# Patient Record
Sex: Female | Born: 1945 | Race: White | Hispanic: No | Marital: Married | Smoking: Never smoker
Health system: Southern US, Community
[De-identification: ages and names within clinical notes are randomized; demographics above are authoritative.]

## PROBLEM LIST (undated history)

## (undated) DIAGNOSIS — M1990 Unspecified osteoarthritis, unspecified site: Secondary | ICD-10-CM

## (undated) DIAGNOSIS — R059 Cough, unspecified: Secondary | ICD-10-CM

## (undated) DIAGNOSIS — R238 Other skin changes: Secondary | ICD-10-CM

## (undated) DIAGNOSIS — E119 Type 2 diabetes mellitus without complications: Secondary | ICD-10-CM

## (undated) DIAGNOSIS — M25569 Pain in unspecified knee: Secondary | ICD-10-CM

## (undated) DIAGNOSIS — I517 Cardiomegaly: Secondary | ICD-10-CM

## (undated) DIAGNOSIS — J309 Allergic rhinitis, unspecified: Secondary | ICD-10-CM

## (undated) DIAGNOSIS — J3489 Other specified disorders of nose and nasal sinuses: Secondary | ICD-10-CM

## (undated) DIAGNOSIS — N3281 Overactive bladder: Secondary | ICD-10-CM

## (undated) DIAGNOSIS — R233 Spontaneous ecchymoses: Secondary | ICD-10-CM

## (undated) DIAGNOSIS — M810 Age-related osteoporosis without current pathological fracture: Secondary | ICD-10-CM

## (undated) DIAGNOSIS — H5789 Other specified disorders of eye and adnexa: Secondary | ICD-10-CM

## (undated) DIAGNOSIS — R0602 Shortness of breath: Secondary | ICD-10-CM

## (undated) DIAGNOSIS — M2550 Pain in unspecified joint: Secondary | ICD-10-CM

## (undated) DIAGNOSIS — E559 Vitamin D deficiency, unspecified: Secondary | ICD-10-CM

## (undated) DIAGNOSIS — I10 Essential (primary) hypertension: Secondary | ICD-10-CM

## (undated) DIAGNOSIS — G4733 Obstructive sleep apnea (adult) (pediatric): Secondary | ICD-10-CM

## (undated) DIAGNOSIS — N8110 Cystocele, unspecified: Secondary | ICD-10-CM

## (undated) DIAGNOSIS — Z96653 Presence of artificial knee joint, bilateral: Secondary | ICD-10-CM

## (undated) DIAGNOSIS — G4730 Sleep apnea, unspecified: Secondary | ICD-10-CM

## (undated) DIAGNOSIS — K589 Irritable bowel syndrome without diarrhea: Secondary | ICD-10-CM

## (undated) DIAGNOSIS — I350 Nonrheumatic aortic (valve) stenosis: Secondary | ICD-10-CM

## (undated) DIAGNOSIS — M549 Dorsalgia, unspecified: Secondary | ICD-10-CM

## (undated) DIAGNOSIS — R05 Cough: Secondary | ICD-10-CM

## (undated) DIAGNOSIS — I272 Pulmonary hypertension, unspecified: Secondary | ICD-10-CM

## (undated) DIAGNOSIS — N200 Calculus of kidney: Secondary | ICD-10-CM

## (undated) DIAGNOSIS — H2512 Age-related nuclear cataract, left eye: Secondary | ICD-10-CM

## (undated) DIAGNOSIS — M1712 Unilateral primary osteoarthritis, left knee: Secondary | ICD-10-CM

## (undated) DIAGNOSIS — N318 Other neuromuscular dysfunction of bladder: Secondary | ICD-10-CM

## (undated) DIAGNOSIS — N390 Urinary tract infection, site not specified: Secondary | ICD-10-CM

## (undated) DIAGNOSIS — K219 Gastro-esophageal reflux disease without esophagitis: Secondary | ICD-10-CM

## (undated) DIAGNOSIS — I639 Cerebral infarction, unspecified: Secondary | ICD-10-CM

## (undated) DIAGNOSIS — H9190 Unspecified hearing loss, unspecified ear: Secondary | ICD-10-CM

## (undated) DIAGNOSIS — R682 Dry mouth, unspecified: Secondary | ICD-10-CM

## (undated) DIAGNOSIS — N816 Rectocele: Secondary | ICD-10-CM

## (undated) DIAGNOSIS — R011 Cardiac murmur, unspecified: Secondary | ICD-10-CM

## (undated) DIAGNOSIS — E785 Hyperlipidemia, unspecified: Secondary | ICD-10-CM

## (undated) DIAGNOSIS — E782 Mixed hyperlipidemia: Secondary | ICD-10-CM

## (undated) DIAGNOSIS — G4700 Insomnia, unspecified: Secondary | ICD-10-CM

## (undated) DIAGNOSIS — Z8673 Personal history of transient ischemic attack (TIA), and cerebral infarction without residual deficits: Secondary | ICD-10-CM

## (undated) DIAGNOSIS — R252 Cramp and spasm: Secondary | ICD-10-CM

## (undated) DIAGNOSIS — M7989 Other specified soft tissue disorders: Secondary | ICD-10-CM

## (undated) HISTORY — PX: OTHER SURGICAL HISTORY: SHX169

## (undated) HISTORY — DX: Irritable bowel syndrome without diarrhea: K58.9

## (undated) HISTORY — DX: Pain in unspecified knee: M25.569

## (undated) HISTORY — PX: KIDNEY STONE SURGERY: SHX686

## (undated) HISTORY — DX: Personal history of transient ischemic attack (TIA), and cerebral infarction without residual deficits: Z86.73

## (undated) HISTORY — PX: RECTOCELE REPAIR: SHX761

## (undated) HISTORY — DX: Essential (primary) hypertension: I10

## (undated) HISTORY — DX: Cardiac murmur, unspecified: R01.1

## (undated) HISTORY — DX: Morbid (severe) obesity due to excess calories: E66.01

## (undated) HISTORY — DX: Dorsalgia, unspecified: M54.9

## (undated) HISTORY — DX: Spontaneous ecchymoses: R23.3

## (undated) HISTORY — DX: Mixed hyperlipidemia: E78.2

## (undated) HISTORY — DX: Cramp and spasm: R25.2

## (undated) HISTORY — DX: Presence of artificial knee joint, bilateral: Z96.653

## (undated) HISTORY — PX: CYSTOCELE REPAIR: SHX163

## (undated) HISTORY — DX: Calculus of kidney: N20.0

## (undated) HISTORY — DX: Other specified disorders of nose and nasal sinuses: J34.89

## (undated) HISTORY — DX: Other specified disorders of eye and adnexa: H57.89

## (undated) HISTORY — DX: Age-related nuclear cataract, left eye: H25.12

## (undated) HISTORY — DX: Overactive bladder: N32.81

## (undated) HISTORY — DX: Vitamin D deficiency, unspecified: E55.9

## (undated) HISTORY — PX: URETEROSCOPY: SHX842

## (undated) HISTORY — DX: Unilateral primary osteoarthritis, left knee: M17.12

## (undated) HISTORY — DX: Type 2 diabetes mellitus without complications: E11.9

## (undated) HISTORY — DX: Cardiomegaly: I51.7

## (undated) HISTORY — DX: Pain in unspecified joint: M25.50

## (undated) HISTORY — DX: Insomnia, unspecified: G47.00

## (undated) HISTORY — DX: Sleep apnea, unspecified: G47.30

## (undated) HISTORY — PX: URETERAL STENT PLACEMENT: SHX822

## (undated) HISTORY — DX: Shortness of breath: R06.02

## (undated) HISTORY — DX: Unspecified osteoarthritis, unspecified site: M19.90

## (undated) HISTORY — DX: Other neuromuscular dysfunction of bladder: N31.8

## (undated) HISTORY — DX: Rectocele: N81.6

## (undated) HISTORY — DX: Age-related osteoporosis without current pathological fracture: M81.0

## (undated) HISTORY — DX: Cough, unspecified: R05.9

## (undated) HISTORY — DX: Gastro-esophageal reflux disease without esophagitis: K21.9

## (undated) HISTORY — DX: Hyperlipidemia, unspecified: E78.5

## (undated) HISTORY — DX: Obstructive sleep apnea (adult) (pediatric): G47.33

## (undated) HISTORY — DX: Cough: R05

## (undated) HISTORY — DX: Unspecified hearing loss, unspecified ear: H91.90

## (undated) HISTORY — DX: Dry mouth, unspecified: R68.2

## (undated) HISTORY — DX: Cerebral infarction, unspecified: I63.9

## (undated) HISTORY — DX: Pulmonary hypertension, unspecified: I27.20

## (undated) HISTORY — DX: Other skin changes: R23.8

## (undated) HISTORY — DX: Allergic rhinitis, unspecified: J30.9

## (undated) HISTORY — DX: Nonrheumatic aortic (valve) stenosis: I35.0

## (undated) HISTORY — DX: Urinary tract infection, site not specified: N39.0

## (undated) HISTORY — DX: Cystocele, unspecified: N81.10

## (undated) HISTORY — PX: CATARACT EXTRACTION: SUR2

## (undated) HISTORY — DX: Other specified soft tissue disorders: M79.89

---
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
## 1983-02-22 HISTORY — PX: FRACTURE SURGERY: SHX138

## 2002-02-21 HISTORY — PX: HERNIA REPAIR: SHX51

## 2010-02-21 HISTORY — PX: JOINT REPLACEMENT: SHX530

## 2012-04-05 ENCOUNTER — Ambulatory Visit: Payer: Medicare Other | Admitting: Physical Therapy

## 2012-04-12 ENCOUNTER — Ambulatory Visit: Payer: Medicare Other | Attending: Orthopedic Surgery | Admitting: Rehabilitation

## 2012-04-12 DIAGNOSIS — IMO0001 Reserved for inherently not codable concepts without codable children: Secondary | ICD-10-CM | POA: Insufficient documentation

## 2012-04-12 DIAGNOSIS — M25579 Pain in unspecified ankle and joints of unspecified foot: Secondary | ICD-10-CM | POA: Insufficient documentation

## 2012-04-12 DIAGNOSIS — R262 Difficulty in walking, not elsewhere classified: Secondary | ICD-10-CM | POA: Insufficient documentation

## 2012-04-12 DIAGNOSIS — M25673 Stiffness of unspecified ankle, not elsewhere classified: Secondary | ICD-10-CM | POA: Insufficient documentation

## 2012-04-12 DIAGNOSIS — M25676 Stiffness of unspecified foot, not elsewhere classified: Secondary | ICD-10-CM | POA: Insufficient documentation

## 2012-04-16 ENCOUNTER — Ambulatory Visit: Payer: Medicare Other | Admitting: Rehabilitation

## 2012-04-19 ENCOUNTER — Ambulatory Visit: Payer: Medicare Other | Admitting: Rehabilitation

## 2012-04-23 ENCOUNTER — Ambulatory Visit: Payer: Medicare Other | Attending: Orthopedic Surgery | Admitting: Rehabilitation

## 2012-04-23 DIAGNOSIS — M25673 Stiffness of unspecified ankle, not elsewhere classified: Secondary | ICD-10-CM | POA: Insufficient documentation

## 2012-04-23 DIAGNOSIS — IMO0001 Reserved for inherently not codable concepts without codable children: Secondary | ICD-10-CM | POA: Insufficient documentation

## 2012-04-23 DIAGNOSIS — M25676 Stiffness of unspecified foot, not elsewhere classified: Secondary | ICD-10-CM | POA: Insufficient documentation

## 2012-04-23 DIAGNOSIS — M25579 Pain in unspecified ankle and joints of unspecified foot: Secondary | ICD-10-CM | POA: Insufficient documentation

## 2012-04-23 DIAGNOSIS — R262 Difficulty in walking, not elsewhere classified: Secondary | ICD-10-CM | POA: Insufficient documentation

## 2012-04-26 ENCOUNTER — Ambulatory Visit: Payer: Medicare Other | Admitting: Rehabilitation

## 2012-04-30 ENCOUNTER — Ambulatory Visit: Payer: Medicare Other | Admitting: Rehabilitation

## 2012-05-03 ENCOUNTER — Ambulatory Visit: Payer: Medicare Other | Admitting: Rehabilitation

## 2012-05-07 ENCOUNTER — Ambulatory Visit: Payer: Medicare Other | Admitting: Rehabilitation

## 2012-05-10 ENCOUNTER — Ambulatory Visit: Payer: Medicare Other | Admitting: Rehabilitation

## 2012-05-15 ENCOUNTER — Ambulatory Visit: Payer: Medicare Other | Admitting: Rehabilitation

## 2012-05-18 ENCOUNTER — Ambulatory Visit: Payer: Medicare Other | Admitting: Rehabilitation

## 2012-05-21 ENCOUNTER — Ambulatory Visit: Payer: Medicare Other | Admitting: Rehabilitation

## 2012-05-24 ENCOUNTER — Ambulatory Visit: Payer: Medicare Other | Attending: Orthopedic Surgery | Admitting: Rehabilitation

## 2012-05-24 DIAGNOSIS — M25673 Stiffness of unspecified ankle, not elsewhere classified: Secondary | ICD-10-CM | POA: Insufficient documentation

## 2012-05-24 DIAGNOSIS — IMO0001 Reserved for inherently not codable concepts without codable children: Secondary | ICD-10-CM | POA: Insufficient documentation

## 2012-05-24 DIAGNOSIS — M25579 Pain in unspecified ankle and joints of unspecified foot: Secondary | ICD-10-CM | POA: Insufficient documentation

## 2012-05-24 DIAGNOSIS — M25676 Stiffness of unspecified foot, not elsewhere classified: Secondary | ICD-10-CM | POA: Insufficient documentation

## 2012-05-24 DIAGNOSIS — R262 Difficulty in walking, not elsewhere classified: Secondary | ICD-10-CM | POA: Insufficient documentation

## 2012-05-28 ENCOUNTER — Ambulatory Visit: Payer: Medicare Other | Admitting: Rehabilitation

## 2012-05-31 ENCOUNTER — Encounter: Payer: Medicare Other | Admitting: Rehabilitation

## 2012-06-04 ENCOUNTER — Ambulatory Visit: Payer: Medicare Other | Admitting: Rehabilitation

## 2012-06-07 ENCOUNTER — Encounter: Payer: Medicare Other | Admitting: Rehabilitation

## 2012-06-11 ENCOUNTER — Ambulatory Visit: Payer: Medicare Other | Admitting: Rehabilitation

## 2012-06-14 ENCOUNTER — Encounter: Payer: Medicare Other | Admitting: Rehabilitation

## 2012-06-18 ENCOUNTER — Ambulatory Visit: Payer: Medicare Other | Admitting: Rehabilitation

## 2012-06-18 ENCOUNTER — Encounter: Payer: Medicare Other | Admitting: Rehabilitation

## 2012-06-21 ENCOUNTER — Encounter: Payer: Medicare Other | Admitting: Rehabilitation

## 2012-08-08 ENCOUNTER — Ambulatory Visit: Payer: Medicare Other | Admitting: Rehabilitation

## 2012-08-10 ENCOUNTER — Ambulatory Visit: Payer: Medicare Other | Attending: Orthopedic Surgery | Admitting: Rehabilitation

## 2012-08-10 DIAGNOSIS — M25673 Stiffness of unspecified ankle, not elsewhere classified: Secondary | ICD-10-CM | POA: Insufficient documentation

## 2012-08-10 DIAGNOSIS — IMO0001 Reserved for inherently not codable concepts without codable children: Secondary | ICD-10-CM | POA: Insufficient documentation

## 2012-08-10 DIAGNOSIS — M25579 Pain in unspecified ankle and joints of unspecified foot: Secondary | ICD-10-CM | POA: Insufficient documentation

## 2012-08-10 DIAGNOSIS — R262 Difficulty in walking, not elsewhere classified: Secondary | ICD-10-CM | POA: Insufficient documentation

## 2012-08-10 DIAGNOSIS — M25676 Stiffness of unspecified foot, not elsewhere classified: Secondary | ICD-10-CM | POA: Insufficient documentation

## 2012-08-13 ENCOUNTER — Ambulatory Visit: Payer: Medicare Other | Admitting: Rehabilitation

## 2012-08-15 ENCOUNTER — Ambulatory Visit: Payer: Medicare Other | Admitting: Rehabilitation

## 2012-08-20 ENCOUNTER — Ambulatory Visit: Payer: Medicare Other | Admitting: Rehabilitation

## 2012-08-22 ENCOUNTER — Ambulatory Visit: Payer: Medicare Other | Attending: Orthopedic Surgery | Admitting: Rehabilitation

## 2012-08-22 DIAGNOSIS — M25673 Stiffness of unspecified ankle, not elsewhere classified: Secondary | ICD-10-CM | POA: Insufficient documentation

## 2012-08-22 DIAGNOSIS — M25676 Stiffness of unspecified foot, not elsewhere classified: Secondary | ICD-10-CM | POA: Insufficient documentation

## 2012-08-22 DIAGNOSIS — R262 Difficulty in walking, not elsewhere classified: Secondary | ICD-10-CM | POA: Insufficient documentation

## 2012-08-22 DIAGNOSIS — M25579 Pain in unspecified ankle and joints of unspecified foot: Secondary | ICD-10-CM | POA: Insufficient documentation

## 2012-08-22 DIAGNOSIS — IMO0001 Reserved for inherently not codable concepts without codable children: Secondary | ICD-10-CM | POA: Insufficient documentation

## 2012-08-27 ENCOUNTER — Ambulatory Visit: Payer: Medicare Other | Admitting: Rehabilitation

## 2012-08-29 ENCOUNTER — Ambulatory Visit: Payer: Medicare Other | Admitting: Rehabilitation

## 2012-09-03 ENCOUNTER — Ambulatory Visit: Payer: Medicare Other | Admitting: Rehabilitation

## 2012-09-05 ENCOUNTER — Ambulatory Visit: Payer: Medicare Other | Admitting: Rehabilitation

## 2012-09-12 ENCOUNTER — Ambulatory Visit: Payer: Medicare Other | Admitting: Rehabilitation

## 2012-09-19 ENCOUNTER — Ambulatory Visit: Payer: Medicare Other | Admitting: Rehabilitation

## 2013-08-13 DIAGNOSIS — T148XXA Other injury of unspecified body region, initial encounter: Secondary | ICD-10-CM | POA: Insufficient documentation

## 2013-08-16 DIAGNOSIS — E663 Overweight: Secondary | ICD-10-CM | POA: Insufficient documentation

## 2013-11-15 DIAGNOSIS — S93402A Sprain of unspecified ligament of left ankle, initial encounter: Secondary | ICD-10-CM | POA: Insufficient documentation

## 2013-12-04 DIAGNOSIS — Z471 Aftercare following joint replacement surgery: Secondary | ICD-10-CM | POA: Insufficient documentation

## 2013-12-04 DIAGNOSIS — Z96651 Presence of right artificial knee joint: Secondary | ICD-10-CM | POA: Insufficient documentation

## 2013-12-10 ENCOUNTER — Ambulatory Visit: Payer: Medicare Other | Attending: Orthopedic Surgery | Admitting: Physical Therapy

## 2013-12-10 DIAGNOSIS — M25672 Stiffness of left ankle, not elsewhere classified: Secondary | ICD-10-CM | POA: Diagnosis not present

## 2013-12-10 DIAGNOSIS — S93402A Sprain of unspecified ligament of left ankle, initial encounter: Secondary | ICD-10-CM | POA: Diagnosis not present

## 2013-12-10 DIAGNOSIS — M25572 Pain in left ankle and joints of left foot: Secondary | ICD-10-CM | POA: Diagnosis not present

## 2013-12-10 DIAGNOSIS — R011 Cardiac murmur, unspecified: Secondary | ICD-10-CM | POA: Diagnosis not present

## 2013-12-10 DIAGNOSIS — E119 Type 2 diabetes mellitus without complications: Secondary | ICD-10-CM | POA: Diagnosis not present

## 2013-12-10 DIAGNOSIS — X58XXXA Exposure to other specified factors, initial encounter: Secondary | ICD-10-CM | POA: Diagnosis not present

## 2013-12-10 DIAGNOSIS — Z96651 Presence of right artificial knee joint: Secondary | ICD-10-CM | POA: Insufficient documentation

## 2013-12-10 DIAGNOSIS — Z5189 Encounter for other specified aftercare: Secondary | ICD-10-CM | POA: Insufficient documentation

## 2013-12-10 DIAGNOSIS — I10 Essential (primary) hypertension: Secondary | ICD-10-CM | POA: Diagnosis not present

## 2013-12-11 ENCOUNTER — Ambulatory Visit: Payer: Medicare Other | Admitting: Physical Therapy

## 2013-12-11 DIAGNOSIS — Z5189 Encounter for other specified aftercare: Secondary | ICD-10-CM | POA: Diagnosis not present

## 2013-12-13 DIAGNOSIS — M76822 Posterior tibial tendinitis, left leg: Secondary | ICD-10-CM | POA: Insufficient documentation

## 2013-12-18 ENCOUNTER — Ambulatory Visit: Payer: Medicare Other | Admitting: Physical Therapy

## 2013-12-18 DIAGNOSIS — Z5189 Encounter for other specified aftercare: Secondary | ICD-10-CM | POA: Diagnosis not present

## 2013-12-20 ENCOUNTER — Ambulatory Visit: Payer: Medicare Other | Admitting: Physical Therapy

## 2013-12-20 DIAGNOSIS — Z5189 Encounter for other specified aftercare: Secondary | ICD-10-CM | POA: Diagnosis not present

## 2013-12-23 ENCOUNTER — Encounter: Payer: Medicare Other | Admitting: Physical Therapy

## 2013-12-24 ENCOUNTER — Ambulatory Visit: Payer: Medicare Other | Attending: Orthopedic Surgery | Admitting: Physical Therapy

## 2013-12-24 ENCOUNTER — Encounter (INDEPENDENT_AMBULATORY_CARE_PROVIDER_SITE_OTHER): Payer: Self-pay

## 2013-12-24 DIAGNOSIS — M25672 Stiffness of left ankle, not elsewhere classified: Secondary | ICD-10-CM | POA: Insufficient documentation

## 2013-12-24 DIAGNOSIS — M25572 Pain in left ankle and joints of left foot: Secondary | ICD-10-CM | POA: Diagnosis present

## 2013-12-26 ENCOUNTER — Encounter: Payer: Medicare Other | Admitting: Rehabilitation

## 2013-12-27 ENCOUNTER — Ambulatory Visit: Payer: Medicare Other | Admitting: Rehabilitation

## 2013-12-27 DIAGNOSIS — M25572 Pain in left ankle and joints of left foot: Secondary | ICD-10-CM | POA: Diagnosis not present

## 2013-12-30 ENCOUNTER — Ambulatory Visit: Payer: Medicare Other | Admitting: Physical Therapy

## 2013-12-30 DIAGNOSIS — M25572 Pain in left ankle and joints of left foot: Secondary | ICD-10-CM | POA: Diagnosis not present

## 2014-01-02 ENCOUNTER — Ambulatory Visit: Payer: Medicare Other | Admitting: Rehabilitation

## 2014-01-02 DIAGNOSIS — M25572 Pain in left ankle and joints of left foot: Secondary | ICD-10-CM | POA: Diagnosis not present

## 2014-01-06 ENCOUNTER — Ambulatory Visit: Payer: Medicare Other | Admitting: Physical Therapy

## 2014-01-06 DIAGNOSIS — M25572 Pain in left ankle and joints of left foot: Secondary | ICD-10-CM | POA: Diagnosis not present

## 2014-01-09 ENCOUNTER — Ambulatory Visit: Payer: Medicare Other | Admitting: Rehabilitation

## 2014-01-09 DIAGNOSIS — M25572 Pain in left ankle and joints of left foot: Secondary | ICD-10-CM | POA: Diagnosis not present

## 2014-01-13 ENCOUNTER — Ambulatory Visit: Payer: Medicare Other | Admitting: Physical Therapy

## 2014-01-13 DIAGNOSIS — M25572 Pain in left ankle and joints of left foot: Secondary | ICD-10-CM | POA: Diagnosis not present

## 2014-01-15 ENCOUNTER — Ambulatory Visit: Payer: Medicare Other | Admitting: Rehabilitation

## 2014-01-15 DIAGNOSIS — M25572 Pain in left ankle and joints of left foot: Secondary | ICD-10-CM | POA: Diagnosis not present

## 2014-01-20 ENCOUNTER — Ambulatory Visit: Payer: Medicare Other | Admitting: Physical Therapy

## 2014-01-20 DIAGNOSIS — M25572 Pain in left ankle and joints of left foot: Secondary | ICD-10-CM | POA: Diagnosis not present

## 2014-06-03 ENCOUNTER — Ambulatory Visit: Payer: Medicare Other | Attending: Orthopedic Surgery | Admitting: Physical Therapy

## 2014-06-03 ENCOUNTER — Encounter: Payer: Self-pay | Admitting: Physical Therapy

## 2014-06-03 DIAGNOSIS — R262 Difficulty in walking, not elsewhere classified: Secondary | ICD-10-CM | POA: Diagnosis not present

## 2014-06-03 DIAGNOSIS — M25572 Pain in left ankle and joints of left foot: Secondary | ICD-10-CM | POA: Diagnosis present

## 2014-06-03 DIAGNOSIS — M259 Joint disorder, unspecified: Secondary | ICD-10-CM | POA: Insufficient documentation

## 2014-06-03 DIAGNOSIS — R29898 Other symptoms and signs involving the musculoskeletal system: Secondary | ICD-10-CM

## 2014-06-03 NOTE — Therapy (Signed)
[facility] [address]  [place], [place], [address] Phone: [phone]   Fax:  [phone]  Physical Therapy Evaluation  Patient Details  Name: Jasmin [name] MRN: [id number] Date of Birth: 06-22-1945 Referring Provider:  Donette Larry, MD  Encounter Date: 06/03/2014      PT End of Session - 06/03/14 0954    Visit Number 1   Number of Visits 10   Date for PT Re-Evaluation 07/08/14   PT Start Time 0920   PT Stop Time 1025   PT Time Calculation (min) 65 min      Past Medical History  Diagnosis Date  . Diabetes mellitus without complication   . Sleep apnea   . Hypertension   . Arthritis     Past Surgical History  Procedure Laterality Date  . Joint replacement Right 2012    R TKA  . Fracture surgery Left 1953    L arm  . Fracture surgery Left 1985    L ankle  . R foot/ankle Right 2013 and 2014    plate and screws lateral foot and tendon removal medial foot in 2013, revision in 2014    There were no vitals filed for this visit.  Visit Diagnosis:  Left ankle pain - Plan: PT plan of care cert/re-cert  Ankle weakness - Plan: PT plan of care cert/re-cert  Difficulty walking - Plan: PT plan of care cert/re-cert      Subjective Assessment - 06/03/14 0943    Subjective pt here due to L foot/ankle pain.  She has history of fx to this ankle 30 years ago with fixation.  She fell on 9Aug2015 and noted pain to L foot/ankle.  X-rays indicated high ankle sprain and she has been in/out boot and brace since that time.  She participated in PT at this location under the care of a different Physical Therapy provider in Nov and Dec 2015 with progression to ambulation without need for boot.  She returns today due to continued pain.  She states she doesn't feel any worse than the last time she was here in Dec 2015 but states she does not feel any better either and MD wants her to return to PT and to include  iontorphoresis and [name] into POC at this time.   How long can you stand comfortably? maybe 20 minutes   How long can you walk comfortably? 10-15 minutes   Patient Stated Goals be able to get back to walking/hiking without pain   Currently in Pain? Yes   Pain Score --  0/10 at rest, 4-5/10 with walking, 5-6/10 at worst   Pain Location Ankle   Pain Orientation Left;Medial   Aggravating Factors  standing, walking, and ankle IV AROM   Pain Relieving Factors meds, rest, ice   Multiple Pain Sites No            [facility] PT Assessment - 06/03/14 0001    Assessment   Medical Diagnosis L poterior tib dysfunction   Onset Date 09/29/13   Balance Screen   Has the patient fallen in the past 6 months No   Has the patient had a decrease in activity level because of a fear of falling?  No   Is the patient reluctant to leave their home because of a fear of falling?  No   Prior Function   Vocation Retired   Leisure stays busy with volunteer work, likes to walk 2-3x/wk on track at sports center but  has not been participating lately, uses stationary bike 5 min/day at home   Observation/Other Assessments   Focus on Therapeutic Outcomes (FOTO)  52% limitation   Functional Tests   Functional tests Squat;Single leg stance   Squat   Comments L navicular drop, L tibia E and medial collapse L knee   Single Leg Stance   Comments unsteady B limited to 3-5" on R and unable on L   Posture/Postural Control   Posture Comments overweight, L pes planus greater than R, L foot ER vs L in standing and walking   ROM / Strength   AROM / PROM / Strength PROM;Strength   PROM   Overall PROM Comments restricted L ankle DF   Strength   Overall Strength Comments L Ankle IV 4/5 (pain), L Hip ER 4/5 (no pain).  Hip Ext, ABD, and ADD not assessed today.   Flexibility   Soft Tissue Assessment /Muscle Length --  Tight L Achilles   Palpation   Palpation TTP medial L ankle and lower leg in area of posterior tib tendon         TODAY'S TREATMENT: TherEx - Ankle DF stretch standing, Hip ER Black TB 15x                   PT Education - 06/03/14 1302    Education provided Yes   Education Details Ankle DF stretch, side-lying clams with black TB   Person(s) Educated Patient   Methods Explanation;Demonstration   Comprehension Returned demonstration;Verbalized understanding          PT Short Term Goals - 06/03/14 1312    PT SHORT TERM GOAL #1   Title pt independent with initial HEP by 06/10/14   Status New           PT Long Term Goals - 06/03/14 1312    PT LONG TERM GOAL #1   Title pt independent with advanced HEP as necessary by 06/1714   Status New   PT LONG TERM GOAL #2   Title pt able to stand/walk up to 1 hour without limit by pain by 07/08/14   Status New   PT LONG TERM GOAL #3   Title Squat mechanics improved to include knees tracking over toes with minimal to no medial collapse and less navicular drop on L by 07/08/14   Status New               Plan - 06/03/14 1303    Clinical Impression Statement pt with c/o L medial ankle pain which seems posterior tib tendon in nature.  She displays multiple factors which contribute to this pain to include: poor shoe choice, excessive L navicular drop, weak hip ER, tight  L Achilles tendon, and obesity.  We will address all limitations that we can effect with hopes of decreased pain with increased function.   Pt will benefit from skilled therapeutic intervention in order to improve on the following deficits Pain;Abnormal gait;Impaired flexibility;Difficulty walking;Decreased range of motion;Improper body mechanics;Decreased activity tolerance;Obesity;Decreased balance;Decreased strength   Rehab Potential Good   PT Frequency 2x / week   PT Duration --  5 weeks   PT Treatment/Interventions Therapeutic exercise;Manual techniques;Ultrasound;Cryotherapy;Electrical Stimulation;Therapeutic activities;Dry needling;Gait training;Balance  training;Other (comment)  iontophoresis with 43mA.min dex   PT Next Visit Plan limit standing exercises for now - perform mat exercises for hip and ankle strengthening; ankle DF stretching, begin ionto.   Consulted and Agree with Plan of Care Patient  G-Codes - 06/03/14 1320    Functional Limitation Mobility: Walking and moving around   Mobility: Walking and Moving Around Current Status [phone]) At least 40 percent but less than 60 percent impaired, limited or restricted   Mobility: Walking and Moving Around Goal Status [phone]) At least 20 percent but less than 40 percent impaired, limited or restricted       Problem List There are no active problems to display for this patient.   Cornesha Radziewicz PT, OCS 06/03/2014, 1:22 PM  [facility] [address]  [place], [place], [address] Phone: [phone]   Fax:  [phone]

## 2014-06-05 ENCOUNTER — Ambulatory Visit: Payer: Medicare Other | Admitting: Physical Therapy

## 2014-06-05 DIAGNOSIS — M25572 Pain in left ankle and joints of left foot: Secondary | ICD-10-CM

## 2014-06-05 DIAGNOSIS — R29898 Other symptoms and signs involving the musculoskeletal system: Secondary | ICD-10-CM

## 2014-06-05 DIAGNOSIS — R262 Difficulty in walking, not elsewhere classified: Secondary | ICD-10-CM

## 2014-06-05 NOTE — Therapy (Signed)
[facility] [address]  [place], [place], [address] Phone: [phone]   Fax:  [phone]  Physical Therapy Treatment  Patient Details  Name: Jasmin [name] MRN: [id number] Date of Birth: 1945/08/12 Referring Provider:  Donette Larry, MD  Encounter Date: 06/05/2014      PT End of Session - 06/05/14 0807    Visit Number 2   Number of Visits 10   Date for PT Re-Evaluation 07/08/14   PT Start Time 0803   PT Stop Time 0846   PT Time Calculation (min) 43 min      Past Medical History  Diagnosis Date  . Diabetes mellitus without complication   . Sleep apnea   . Hypertension   . Arthritis     Past Surgical History  Procedure Laterality Date  . Joint replacement Right 2012    R TKA  . Fracture surgery Left 1953    L arm  . Fracture surgery Left 1985    L ankle  . R foot/ankle Right 2013 and 2014    plate and screws lateral foot and tendon removal medial foot in 2013, revision in 2014    There were no vitals filed for this visit.  Visit Diagnosis:  Ankle weakness  Left ankle pain  Difficulty walking      Subjective Assessment - 06/05/14 0805    Subjective states feels fine this AM but is still early and hasn't walked much yet.   Currently in Pain? Yes   Pain Score 2    Pain Location Ankle   Pain Orientation Left;Medial   Multiple Pain Sites No       TODAY'S TREATMENT: TherEx - NuStep lvl 3, 4' DF Rocker Stretch 3x20" Bridge 10x Bridge with Alt Knee Ext 10x Side-Lying Clam Black TB 10x Bridge with Black TB at knees 10x TRX DL Squat   [name] - 20%, 3.3 MHz, 0.5W/cm2, 8', 2cm, L post tib tendon  Ionto patch (6 hour STAT patch with 11mA.min dex) to medial L ankle over post tib tendon.  Pt advised to check blood sugar and remove patch early if any adverse reaction noted.  Otherwise she is to remove between 1245 and 1445.                        PT Short Term Goals  - 06/05/14 0818    PT SHORT TERM GOAL #1   Title pt independent with initial HEP by 06/10/14   Status On-going           PT Long Term Goals - 06/05/14 0818    PT LONG TERM GOAL #1   Title pt independent with advanced HEP as necessary by 06/1714   Status On-going   PT LONG TERM GOAL #2   Title pt able to stand/walk up to 1 hour without limit by pain by 07/08/14   Status On-going   PT LONG TERM GOAL #3   Title Squat mechanics improved to include knees tracking over toes with minimal to no medial collapse and less navicular drop on L by 07/08/14   Status On-going               Plan - 06/05/14 0929    Clinical Impression Statement good tolerance to initial treatment focus on hip stability, is to perform ankle tband exercises at home along with HEP from initial eval.   PT Next Visit Plan limit standing exercises for now -  perform mat exercises for hip and ankle strengthening; ankle DF stretching, continue ionto if tolerated well.   Consulted and Agree with Plan of Care Patient        Problem List There are no active problems to display for this patient.   Audrie Kuri PT, OCS 06/05/2014, 9:31 AM  [facility] [address]  [place], [place], [address] Phone: [phone]   Fax:  [phone]

## 2014-06-09 ENCOUNTER — Ambulatory Visit: Payer: Medicare Other | Admitting: Rehabilitation

## 2014-06-09 DIAGNOSIS — M25572 Pain in left ankle and joints of left foot: Secondary | ICD-10-CM

## 2014-06-09 DIAGNOSIS — R262 Difficulty in walking, not elsewhere classified: Secondary | ICD-10-CM

## 2014-06-09 DIAGNOSIS — R29898 Other symptoms and signs involving the musculoskeletal system: Secondary | ICD-10-CM

## 2014-06-09 NOTE — Therapy (Signed)
[facility] [address]  [place], [place], [address] Phone: [phone]   Fax:  [phone]  Physical Therapy Treatment  Patient Details  Name: Jasmin [name] MRN: [id number] Date of Birth: 05/16/1945 Referring Provider:  Donette Larry, MD  Encounter Date: 06/09/2014      PT End of Session - 06/09/14 1054    Visit Number 3   Number of Visits 10   Date for PT Re-Evaluation 07/08/14   PT Start Time 1055   PT Stop Time 1145   PT Time Calculation (min) 50 min      Past Medical History  Diagnosis Date  . Diabetes mellitus without complication   . Sleep apnea   . Hypertension   . Arthritis     Past Surgical History  Procedure Laterality Date  . Joint replacement Right 2012    R TKA  . Fracture surgery Left 1953    L arm  . Fracture surgery Left 1985    L ankle  . R foot/ankle Right 2013 and 2014    plate and screws lateral foot and tendon removal medial foot in 2013, revision in 2014    There were no vitals filed for this visit.  Visit Diagnosis:  Ankle weakness  Left ankle pain  Difficulty walking      Subjective Assessment - 06/09/14 1059    Subjective Reports patch did wonderful with no problems. Didn't have the soreness in the Lt foot until last night. Also feels like she can go down stairs better (like her foot is  bending more).   Currently in Pain? No/denies      TODAY'S TREATMENT: TherEx - NuStep lvl 3, 4' DF Rocker Stretch 3x20" TRX DL Squat 10x Side-Lying Clam Black TB 2x15 (bilateral) Bridge 10x SLR 10x (bilateral) 4 way ankle green TB 10x (Lt)  [name] - 20%, 3.3 MHz, 0.5W/cm2, 8', 2cm, L post tib tendon  Ionto patch (6 hour STAT patch with 25mA.min dex) to medial L ankle over post tib tendon with instruction on removal between 1545 an 1745.         PT Short Term Goals - 06/05/14 0818    PT SHORT TERM GOAL #1   Title pt independent with initial HEP by 06/10/14   Status On-going           PT Long Term Goals - 06/05/14 0818    PT LONG TERM GOAL #1   Title pt independent with advanced HEP as necessary by 06/1714   Status On-going   PT LONG TERM GOAL #2   Title pt able to stand/walk up to 1 hour without limit by pain by 07/08/14   Status On-going   PT LONG TERM GOAL #3   Title Squat mechanics improved to include knees tracking over toes with minimal to no medial collapse and less navicular drop on L by 07/08/14   Status On-going               Plan - 06/09/14 1151    Clinical Impression Statement Good tolerance to exercises with no increased pain noted with increased reps.   PT Next Visit Plan limit standing exercises for now - perform mat exercises for hip and ankle strengthening; ankle DF stretching, continue ionto.    Consulted and Agree with Plan of Care Patient        Problem List There are no active problems to display for this patient.   Barbette Hair, PTA 06/09/2014,  11:52 AM  [facility] [address]  [place], [place], [address] Phone: [phone]   Fax:  [phone]

## 2014-06-11 ENCOUNTER — Ambulatory Visit: Payer: Medicare Other | Admitting: Rehabilitation

## 2014-06-11 DIAGNOSIS — R29898 Other symptoms and signs involving the musculoskeletal system: Secondary | ICD-10-CM

## 2014-06-11 DIAGNOSIS — R262 Difficulty in walking, not elsewhere classified: Secondary | ICD-10-CM

## 2014-06-11 DIAGNOSIS — M25572 Pain in left ankle and joints of left foot: Secondary | ICD-10-CM

## 2014-06-11 NOTE — Therapy (Signed)
[facility] [address]  [place], [place], [address] Phone: [phone]   Fax:  [phone]  Physical Therapy Treatment  Patient Details  Name: Jasmin [name] MRN: [id number] Date of Birth: [birth year] Referring Provider:  Donette Larry, MD  Encounter Date: 06/11/2014      PT End of Session - 06/11/14 0754    Visit Number 4   Number of Visits 10   Date for PT Re-Evaluation 07/08/14   PT Start Time 0800   PT Stop Time 0839   PT Time Calculation (min) 39 min      Past Medical History  Diagnosis Date  . Diabetes mellitus without complication   . Sleep apnea   . Hypertension   . Arthritis     Past Surgical History  Procedure Laterality Date  . Joint replacement Right 2012    R TKA  . Fracture surgery Left 1953    L arm  . Fracture surgery Left 1985    L ankle  . R foot/ankle Right 2013 and 2014    plate and screws lateral foot and tendon removal medial foot in 2013, revision in 2014    There were no vitals filed for this visit.  Visit Diagnosis:  Ankle weakness  Left ankle pain  Difficulty walking      Subjective Assessment - 06/11/14 0813    Subjective States she overdid it yesterday with cleaning her patio and deck furniture. Sore today but no pain.    Currently in Pain? No/denies         TODAY'S TREATMENT: DF Rocker Stretch 3x20" TRX DL Squat 12x Bridge with Black TB around knees 10x Hooklying Hip Abd Black TB DL 15x, SL 10x each Partial SL Bridges 10x SLR 12x Bridges with Alt Knee Extension 10x  [name] - 20%, 3.3 MHz, 0.5W/cm2, 8', 2cm, L post tib tendon  Ionto patch (6 hour STAT patch with 58mA.min dex) to medial L ankle over post tib tendon with instruction on removal between 1545 an 1745.        PT Short Term Goals - 06/05/14 0818    PT SHORT TERM GOAL #1   Title pt independent with initial HEP by 06/10/14   Status On-going           PT Long Term Goals -  06/05/14 0818    PT LONG TERM GOAL #1   Title pt independent with advanced HEP as necessary by 06/1714   Status On-going   PT LONG TERM GOAL #2   Title pt able to stand/walk up to 1 hour without limit by pain by 07/08/14   Status On-going   PT LONG TERM GOAL #3   Title Squat mechanics improved to include knees tracking over toes with minimal to no medial collapse and less navicular drop on L by 07/08/14   Status On-going               Plan - 06/11/14 0839    Clinical Impression Statement Some difficulty with bridging exercises, especially with partial SL. No increased pain.    PT Next Visit Plan Continue mat exercises for hip and ankle strengthening, DF stretching, Ionto   Consulted and Agree with Plan of Care Patient        Problem List There are no active problems to display for this patient.   Barbette Hair, PTA 06/11/2014, 8:41 AM  [facility] [place]  [place], [place], [address] Phone: [phone]   Fax:  [phone]

## 2014-06-16 ENCOUNTER — Ambulatory Visit: Payer: Medicare Other | Admitting: Physical Therapy

## 2014-06-19 ENCOUNTER — Ambulatory Visit: Payer: Medicare Other | Admitting: Rehabilitation

## 2014-06-19 DIAGNOSIS — M25572 Pain in left ankle and joints of left foot: Secondary | ICD-10-CM | POA: Diagnosis not present

## 2014-06-19 DIAGNOSIS — R29898 Other symptoms and signs involving the musculoskeletal system: Secondary | ICD-10-CM

## 2014-06-19 DIAGNOSIS — R262 Difficulty in walking, not elsewhere classified: Secondary | ICD-10-CM

## 2014-06-19 NOTE — Therapy (Signed)
[facility] [address]  [place], [place], [address] Phone: [phone]   Fax:  [phone]  Physical Therapy Treatment  Patient Details  Name: Jasmin [name] MRN: [id number] Date of Birth: [birth year] Referring Provider:  Donette Larry, MD  Encounter Date: 06/19/2014      PT End of Session - 06/19/14 0801    Visit Number 5   Number of Visits 10   Date for PT Re-Evaluation 07/08/14   PT Start Time 2993      Past Medical History  Diagnosis Date  . Diabetes mellitus without complication   . Sleep apnea   . Hypertension   . Arthritis     Past Surgical History  Procedure Laterality Date  . Joint replacement Right 2012    R TKA  . Fracture surgery Left 1953    L arm  . Fracture surgery Left 1985    L ankle  . R foot/ankle Right 2013 and 2014    plate and screws lateral foot and tendon removal medial foot in 2013, revision in 2014    There were no vitals filed for this visit.  Visit Diagnosis:  Ankle weakness  Left ankle pain  Difficulty walking      Subjective Assessment - 06/19/14 0759    Subjective Notes some stiffness today but thinks thats from the weather. Still notes pain first thing in the morning but isn't as sore as it has been.    Currently in Pain? No/denies      TODAY'S TREATMENT: Nustep level 5x5' LE only DF Rocker Stretch 3x20" bilateral Seated Pen Coin 10x3" Bridges 12x3" Side-Lying Hip ER clam Black TB 12x Side-Lying Hip Abd clams Black TB 10x Bridges with Alt Knee Extension 10x  [name] - 20%, 3.3 MHz, 0.5W/cm2, 8', 2cm, L post tib tendon  Ionto patch (6 hour STAT patch with 67mA.min dex) to medial L ankle over post tib tendon with instruction on removal between 1545 an 1745         PT Short Term Goals - 06/05/14 0818    PT SHORT TERM GOAL #1   Title pt independent with initial HEP by 06/10/14   Status On-going           PT Long Term Goals - 06/05/14 0818     PT LONG TERM GOAL #1   Title pt independent with advanced HEP as necessary by 06/1714   Status On-going   PT LONG TERM GOAL #2   Title pt able to stand/walk up to 1 hour without limit by pain by 07/08/14   Status On-going   PT LONG TERM GOAL #3   Title Squat mechanics improved to include knees tracking over toes with minimal to no medial collapse and less navicular drop on L by 07/08/14   Status On-going               Problem List There are no active problems to display for this patient.   Barbette Hair, PTA 06/19/2014, 8:02 AM  [facility] [address]  [place], [place], [address] Phone: [phone]   Fax:  [phone]

## 2014-06-24 ENCOUNTER — Ambulatory Visit: Payer: Medicare Other | Attending: Orthopedic Surgery | Admitting: Physical Therapy

## 2014-06-24 DIAGNOSIS — M259 Joint disorder, unspecified: Secondary | ICD-10-CM | POA: Insufficient documentation

## 2014-06-24 DIAGNOSIS — M25572 Pain in left ankle and joints of left foot: Secondary | ICD-10-CM | POA: Diagnosis present

## 2014-06-24 DIAGNOSIS — R262 Difficulty in walking, not elsewhere classified: Secondary | ICD-10-CM | POA: Diagnosis not present

## 2014-06-24 DIAGNOSIS — R29898 Other symptoms and signs involving the musculoskeletal system: Secondary | ICD-10-CM

## 2014-06-24 NOTE — Therapy (Signed)
[facility] [address]  [place], [place], [address] Phone: [phone]   Fax:  [phone]  Physical Therapy Treatment  Patient Details  Name: Jasmin [name] MRN: [id number] Date of Birth: May 03, 1945 Referring Provider:  Donette Larry, MD  Encounter Date: 06/24/2014      PT End of Session - 06/24/14 0854    Visit Number 6   Number of Visits 10   Date for PT Re-Evaluation 07/08/14   PT Start Time 0852   PT Stop Time 0935   PT Time Calculation (min) 43 min      Past Medical History  Diagnosis Date  . Diabetes mellitus without complication   . Sleep apnea   . Hypertension   . Arthritis     Past Surgical History  Procedure Laterality Date  . Joint replacement Right 2012    R TKA  . Fracture surgery Left 1953    L arm  . Fracture surgery Left 1985    L ankle  . R foot/ankle Right 2013 and 2014    plate and screws lateral foot and tendon removal medial foot in 2013, revision in 2014    There were no vitals filed for this visit.  Visit Diagnosis:  Left ankle pain  Ankle weakness  Difficulty walking      Subjective Assessment - 06/24/14 0854    Subjective states pain is improving in L ankle with AROM and is able to participate in more yardwork.  States overall is doing well and "feeling much more comfortable about it"   How long can you stand comfortably? 25 minutes   How long can you walk comfortably? 15-20 minutes   Currently in Pain? Yes   Pain Score 1    Pain Location Ankle   Pain Orientation Left         TODAY'S TREATMENT TherEx - NuStep lvl 4, 3' DF Rocker Stretch 3x20" each TRX Squat with Black TB at knees 2x10 SLS Rotation Stab with Red TB 8x5" Standing Hip ABD and ADD with double Green  TB 12x each with single pole A  [name] - 20%, 3.3 MHz, 0.5W/cm2, 8', 2cm, L post tib tendon  Ionto patch #5 (6 hour STAT patch with 15mA.min dex) to medial L ankle over post tib tendon  with instruction on removal between 1545 an 1745                        PT Short Term Goals - 06/24/14 0859    PT SHORT TERM GOAL #1   Title pt independent with initial HEP by 06/10/14   Status Achieved           PT Long Term Goals - 06/24/14 0938    PT LONG TERM GOAL #1   Title pt independent with advanced HEP as necessary by 06/1714   Status On-going   PT LONG TERM GOAL #2   Title pt able to stand/walk up to 1 hour without limit by pain by 07/08/14   Status On-going   PT LONG TERM GOAL #3   Title Squat mechanics improved to include knees tracking over toes with minimal to no medial collapse and less navicular drop on L by 07/08/14   Status On-going               Plan - 06/24/14 0937    Clinical Impression Statement is progressing well with current POC, progressed to more standing exercises today  without pt noting increased pain during treatment   PT Next Visit Plan one more ionto then may begin kinesiotape?  progress exercises as able   Consulted and Agree with Plan of Care Patient        Problem List There are no active problems to display for this patient.   Mindel Friscia PT, OCS 06/24/2014, 9:39 AM  [facility] [address]  [place], [place], [address] Phone: [phone]   Fax:  [phone]

## 2014-06-26 ENCOUNTER — Ambulatory Visit: Payer: Medicare Other | Admitting: Physical Therapy

## 2014-06-26 DIAGNOSIS — R262 Difficulty in walking, not elsewhere classified: Secondary | ICD-10-CM

## 2014-06-26 DIAGNOSIS — M25572 Pain in left ankle and joints of left foot: Secondary | ICD-10-CM

## 2014-06-26 DIAGNOSIS — R29898 Other symptoms and signs involving the musculoskeletal system: Secondary | ICD-10-CM

## 2014-06-26 NOTE — Therapy (Signed)
[facility] [address]  [place], [place], [address] Phone: [phone]   Fax:  [phone]  Physical Therapy Treatment  Patient Details  Name: Jasmin [name] MRN: [id number] Date of Birth: 1946-01-10 Referring Provider:  Donette Larry, MD  Encounter Date: 06/26/2014      PT End of Session - 06/26/14 0950    Visit Number 7   Number of Visits 10   Date for PT Re-Evaluation 07/08/14   PT Start Time 0848   PT Stop Time 0930   PT Time Calculation (min) 42 min      Past Medical History  Diagnosis Date  . Diabetes mellitus without complication   . Sleep apnea   . Hypertension   . Arthritis     Past Surgical History  Procedure Laterality Date  . Joint replacement Right 2012    R TKA  . Fracture surgery Left 1953    L arm  . Fracture surgery Left 1985    L ankle  . R foot/ankle Right 2013 and 2014    plate and screws lateral foot and tendon removal medial foot in 2013, revision in 2014    There were no vitals filed for this visit.  Visit Diagnosis:  Left ankle pain  Ankle weakness  Difficulty walking      Subjective Assessment - 06/26/14 0852    Subjective denies noting increased pain following last treatment with increased    Currently in Pain? Yes   Pain Score 1   0-1/10   Pain Location Ankle   Pain Orientation Left        TODAY'S TREATMENT TherEx - NuStep lvl 5, 4' DF Rocker Stretch 3x20" each TRX Squat with Black TB at knees 2x10x3" SLS FP with TRX Assist 2x20" each SLS Rotation Stab with Red TB 8x5" Side-Stepping with Green TB at B forefoot 20' each way  [name] - 20%, 3.3 MHz, 0.5W/cm2, 8', 5cm, L post tib tendon  Ionto patch #6/6 (6 hour STAT patch with 61mA.min dex) to medial L ankle over post tib tendon with instruction on removal between 1545 an 1745         PT Short Term Goals - 06/24/14 0859    PT SHORT TERM GOAL #1   Title pt independent with initial HEP by  06/10/14   Status Achieved           PT Long Term Goals - 06/24/14 0938    PT LONG TERM GOAL #1   Title pt independent with advanced HEP as necessary by 06/1714   Status On-going   PT LONG TERM GOAL #2   Title pt able to stand/walk up to 1 hour without limit by pain by 07/08/14   Status On-going   PT LONG TERM GOAL #3   Title Squat mechanics improved to include knees tracking over toes with minimal to no medial collapse and less navicular drop on L by 07/08/14   Status On-going               Plan - 06/26/14 0952    Clinical Impression Statement seems to be progressing very well with standing exercises lately; last ionto performed today so will try kinesiotape at future appointments if necessary for pain.  She may meet all goals by end of current POC.   PT Next Visit Plan progress standing exercises as able, trial kinesiotape?, d/c iontophoresis; progress HEP, walk on TM   Consulted and Agree with Plan of Care  Patient        Problem List There are no active problems to display for this patient.   Roxi Hlavaty PT, OCS 06/26/2014, 9:56 AM  [facility] [address]  [place], [place], [address] Phone: [phone]   Fax:  [phone]

## 2014-06-30 ENCOUNTER — Encounter: Payer: Medicare Other | Admitting: Rehabilitation

## 2014-07-01 ENCOUNTER — Ambulatory Visit: Payer: Medicare Other | Admitting: Rehabilitation

## 2014-07-01 DIAGNOSIS — R262 Difficulty in walking, not elsewhere classified: Secondary | ICD-10-CM

## 2014-07-01 DIAGNOSIS — M25572 Pain in left ankle and joints of left foot: Secondary | ICD-10-CM

## 2014-07-01 DIAGNOSIS — R29898 Other symptoms and signs involving the musculoskeletal system: Secondary | ICD-10-CM

## 2014-07-01 NOTE — Therapy (Signed)
[facility] [address]  [place], [place], [address] Phone: [phone]   Fax:  [phone]  Physical Therapy Treatment  Patient Details  Name: Jasmin [name] MRN: [id number] Date of Birth: December 25, 1945 Referring Provider:  Donette Larry, MD  Encounter Date: 07/01/2014      PT End of Session - 07/01/14 1106    Visit Number 8   Number of Visits 10   Date for PT Re-Evaluation 07/08/14   PT Start Time 1103   PT Stop Time 1145   PT Time Calculation (min) 42 min      Past Medical History  Diagnosis Date  . Diabetes mellitus without complication   . Sleep apnea   . Hypertension   . Arthritis     Past Surgical History  Procedure Laterality Date  . Joint replacement Right 2012    R TKA  . Fracture surgery Left 1953    L arm  . Fracture surgery Left 1985    L ankle  . R foot/ankle Right 2013 and 2014    plate and screws lateral foot and tendon removal medial foot in 2013, revision in 2014    There were no vitals filed for this visit.  Visit Diagnosis:  Left ankle pain  Ankle weakness  Difficulty walking      Subjective Assessment - 07/01/14 1105    Subjective Feeling good. Was able to wear normal tennis shoes and go out with some friends for lunch without problems or pain.       TODAY'S TREATMENT TherEx - NuStep lvl 5, 5' (LE only) DF Rocker Stretch 3x20" each SLS Rotational Stab with Red TB 8x5" (difficult without pain) TRX Squat with Black TB at knees 2x10x3" Side-Stepping with Green TB at Bil forefoot 20' each way SLS on Blue Foam 6x max holds (4-8 seconds) on each foot with 1 pole assist   [name] - 20%, 3.3 MHz, 0.5W/cm2, 8', 5cm, L post tib tendon          PT Short Term Goals - 06/24/14 0859    PT SHORT TERM GOAL #1   Title pt independent with initial HEP by 06/10/14   Status Achieved           PT Long Term Goals - 06/24/14 0938    PT LONG TERM GOAL #1   Title pt  independent with advanced HEP as necessary by 06/1714   Status On-going   PT LONG TERM GOAL #2   Title pt able to stand/walk up to 1 hour without limit by pain by 07/08/14   Status On-going   PT LONG TERM GOAL #3   Title Squat mechanics improved to include knees tracking over toes with minimal to no medial collapse and less navicular drop on L by 07/08/14   Status On-going               Plan - 07/01/14 1149    Clinical Impression Statement Good tolerance to standing exercises with no complaint of pain, only difficulty with SLS activities. Did not apply kinesiotape today due to trying and seeing how pt does without.    PT Next Visit Plan Progress standing exercises, try TM, progress HEP   Consulted and Agree with Plan of Care Patient        Problem List There are no active problems to display for this patient.   Barbette Hair, PTA 07/01/2014, 11:51 AM  [facility] [phone]  [employer]  [place], [place], [address] Phone: [phone]   Fax:  [phone]

## 2014-07-03 ENCOUNTER — Ambulatory Visit: Payer: Medicare Other | Admitting: Rehabilitation

## 2014-07-03 DIAGNOSIS — M25572 Pain in left ankle and joints of left foot: Secondary | ICD-10-CM | POA: Diagnosis not present

## 2014-07-03 DIAGNOSIS — R262 Difficulty in walking, not elsewhere classified: Secondary | ICD-10-CM

## 2014-07-03 DIAGNOSIS — R29898 Other symptoms and signs involving the musculoskeletal system: Secondary | ICD-10-CM

## 2014-07-03 NOTE — Therapy (Signed)
[facility] [address]  [place], [place], [address] Phone: [phone]   Fax:  [phone]  Physical Therapy Treatment  Patient Details  Name: Jasmin [name] MRN: [id number] Date of Birth: [birth year] Referring Provider:  Donette Larry, MD  Encounter Date: 07/03/2014      PT End of Session - 07/03/14 0759    Visit Number 9   Number of Visits 10   Date for PT Re-Evaluation 07/08/14   PT Start Time 0800   PT Stop Time 9937   PT Time Calculation (min) 47 min      Past Medical History  Diagnosis Date  . Diabetes mellitus without complication   . Sleep apnea   . Hypertension   . Arthritis     Past Surgical History  Procedure Laterality Date  . Joint replacement Right 2012    R TKA  . Fracture surgery Left 1953    L arm  . Fracture surgery Left 1985    L ankle  . R foot/ankle Right 2013 and 2014    plate and screws lateral foot and tendon removal medial foot in 2013, revision in 2014    There were no vitals filed for this visit.  Visit Diagnosis:  Left ankle pain  Ankle weakness  Difficulty walking      Subjective Assessment - 07/03/14 0802    Subjective States she is feeling good today. Only notes some pain due to the weather (rainy day). States she can do whatever she needs to do around the house and chore wise without pain/problems.    How long can you stand comfortably? 25 minutes   How long can you walk comfortably? 20-25 minutes   Currently in Pain? No/denies      TODAY'S TREATMENT TherEx - NuStep lvl 5, 5' (LE only) DF Rocker Stretch 3x20" each Standing Hip Abd, Ext Green TB x15 bilateral  Side-Stepping with Green TB at Bil forefoot 20' each way SLS Rotational Stab with Red TB 8x5"  TRX Squats 2x10 (good mechanics noted) SLS on Blue Foam 3xmax with PRN finger assist on counter Alternating Hip Abd on Blue Foam with finger assist on counter x10 each  [name] - 20%, 3.3 MHz,  0.5W/cm2, 8', 5cm, L post tib tendon          PT Short Term Goals - 06/24/14 0859    PT SHORT TERM GOAL #1   Title pt independent with initial HEP by 06/10/14   Status Achieved           PT Long Term Goals - 06/24/14 0938    PT LONG TERM GOAL #1   Title pt independent with advanced HEP as necessary by 06/1714   Status On-going   PT LONG TERM GOAL #2   Title pt able to stand/walk up to 1 hour without limit by pain by 07/08/14   Status On-going   PT LONG TERM GOAL #3   Title Squat mechanics improved to include knees tracking over toes with minimal to no medial collapse and less navicular drop on L by 07/08/14   Status On-going               Plan - 07/03/14 0849    Clinical Impression Statement Excellent tolerance to all exercises with no complaint of pain. Pt feels like she might be ready for d/c next week.    PT Next Visit Plan TM, progress HEP, G-code, d/c week?  Problem List There are no active problems to display for this patient.   Barbette Hair, PTA 07/03/2014, 8:50 AM  [facility] [address]  [place], [place], [address] Phone: [phone]   Fax:  [phone]

## 2014-07-04 ENCOUNTER — Ambulatory Visit: Payer: Medicare Other | Admitting: Physical Therapy

## 2014-07-07 ENCOUNTER — Ambulatory Visit: Payer: Medicare Other | Admitting: Physical Therapy

## 2014-07-07 DIAGNOSIS — M25572 Pain in left ankle and joints of left foot: Secondary | ICD-10-CM

## 2014-07-07 DIAGNOSIS — R29898 Other symptoms and signs involving the musculoskeletal system: Secondary | ICD-10-CM

## 2014-07-07 DIAGNOSIS — R262 Difficulty in walking, not elsewhere classified: Secondary | ICD-10-CM

## 2014-07-07 NOTE — Therapy (Signed)
[facility] [address]  [place], [place], [address] Phone: [phone]   Fax:  [phone]  Physical Therapy Treatment  Patient Details  Name: Jasmin [name] MRN: [id number] Date of Birth: 1946/02/09 Referring Provider:  Donette Larry, MD  Encounter Date: 07/07/2014      PT End of Session - 07/07/14 0808    Visit Number 10   Number of Visits 10   Date for PT Re-Evaluation 07/08/14   PT Start Time 0806   PT Stop Time 0845   PT Time Calculation (min) 39 min      Past Medical History  Diagnosis Date  . Diabetes mellitus without complication   . Sleep apnea   . Hypertension   . Arthritis     Past Surgical History  Procedure Laterality Date  . Joint replacement Right 2012    R TKA  . Fracture surgery Left 1953    L arm  . Fracture surgery Left 1985    L ankle  . R foot/ankle Right 2013 and 2014    plate and screws lateral foot and tendon removal medial foot in 2013, revision in 2014    There were no vitals filed for this visit.  Visit Diagnosis:  Ankle weakness  Difficulty walking  Left ankle pain      Subjective Assessment - 07/07/14 0809    Subjective states ankle is "fine", states has been able to participate in yardwork and water aerobics.  Prolonged standing/walking is limited by hip and LBP not L ankle pain at this time.  Only notes intermittent pain to L ankle with IV motions.   How long can you stand comfortably? 20-30 minutes on hard surfaces but this is due to hip and back pain more than L ankle pain   How long can you walk comfortably? 20-30 minutes on hard surfaces but this is due to hip and back pain more than L ankle pain   Currently in Pain? No/denies            [facility] PT Assessment - 07/07/14 0001    Assessment   Medical Diagnosis L Posterior Tib Dysfunction   Onset Date 09/29/13   Squat   Comments continued L navicular drop but is able to prevent medial collapse  of knee   Single Leg Stance   Comments still unable to SLS on R or L for greater than 3-5"   PROM   Overall PROM Comments L ankle DF WNL   Strength   Overall Strength Comments L Ankle IV improved to 5/5, B Ankle PF 3/5.    Palpation   Palpation no TTP noted throughout L ankle     TODAY'S TREATMENT NuStep lvl 6, 5' Goal assessment PROM and MMT and squat assessment Wall Slide Blue TB at knees 2x10 TM 0%, 2.27mh, 4' (infrequent but present tendency for R tibia ER, corrects with VC) Discussed activity progression and benefit of pool exercises encouraging pt to continue with this.              PT Education - 07/07/14 [phone]   Education provided Yes   Education Details Wall sit with TB band at knees   Person(s) Educated Patient   Methods Explanation;Demonstration   Comprehension Verbalized understanding;Returned demonstration          PT Short Term Goals - 06/24/14 0859    PT SHORT TERM GOAL #1   Title pt independent with initial HEP by 06/10/14  Status Achieved           PT Long Term Goals - 2014-07-14 0850    PT LONG TERM GOAL #1   Title pt independent with advanced HEP as necessary by 06/1714   Status Achieved   PT LONG TERM GOAL #2   Title pt able to stand/walk up to 1 hour without limit by pain by 07/08/14  states is not limited by L ankle pain but does note R ankle, knee, hip, and LBP   Status Partially Met   PT LONG TERM GOAL #3   Title Squat mechanics improved to include knees tracking over toes with minimal to no medial collapse and less navicular drop on L by 07/08/14   Status Achieved               Plan - 07/14/2014 0939    Clinical Impression Statement pt has met goals related to L ankle pain/symptoms.  Gait and squats improved to near normal (still with navicular drop but able to control knee mechanics with squatting). Ankle DF WNL.  Ankle MMT 5/5 other than PF limited to 3/5 bilaterally due to pain, weakness, and due to pt being overweight.  Pt  is being discharged due to meeting PT goals.   PT Next Visit Plan discharge to HEP   Consulted and Agree with Plan of Care Patient          G-Codes - 14-Jul-2014 8412    Functional Assessment Tool Used clinical impression, ROM, MMT, pain, gait   Functional Limitation Mobility: Walking and moving around   Mobility: Walking and Moving Around Current Status [phone]) At least 20 percent but less than 40 percent impaired, limited or restricted   Mobility: Walking and Moving Around Goal Status [phone]) At least 20 percent but less than 40 percent impaired, limited or restricted   Mobility: Walking and Moving Around Discharge Status [phone]) At least 20 percent but less than 40 percent impaired, limited or restricted      Problem List There are no active problems to display for this patient.   Sonny Poth PT, OCS Jul 14, 2014, 9:45 AM  [facility] [address]  [place], [place], [address] Phone: [phone]   Fax:  [phone]   PHYSICAL THERAPY DISCHARGE SUMMARY  Visits from Start of Care: 10  Current functional level related to goals / functional outcomes: All PT goals met   Remaining deficits: Ankle PF weakness, intermittent / infrequent ankle pain   Education / Equipment: HEP Plan: Patient agrees to discharge.  Patient goals were met. Patient is being discharged due to meeting the stated rehab goals.  ?????        Leonette Most PT, OCS 14-Jul-2014 9:47 AM

## 2014-07-10 ENCOUNTER — Ambulatory Visit: Payer: Medicare Other | Admitting: Rehabilitation

## 2015-02-22 HISTORY — PX: JOINT REPLACEMENT: SHX530

## 2015-03-06 LAB — LIPID PANEL
Cholesterol: 197 (ref 0–200)
HDL: 49 (ref 35–70)
LDL CALC: 112
TRIGLYCERIDES: 345 — AB (ref 40–160)

## 2015-03-09 DIAGNOSIS — M810 Age-related osteoporosis without current pathological fracture: Secondary | ICD-10-CM

## 2015-03-09 DIAGNOSIS — E559 Vitamin D deficiency, unspecified: Secondary | ICD-10-CM

## 2015-03-09 DIAGNOSIS — N318 Other neuromuscular dysfunction of bladder: Secondary | ICD-10-CM

## 2015-03-09 DIAGNOSIS — J309 Allergic rhinitis, unspecified: Secondary | ICD-10-CM

## 2015-03-09 DIAGNOSIS — K219 Gastro-esophageal reflux disease without esophagitis: Secondary | ICD-10-CM | POA: Insufficient documentation

## 2015-03-09 DIAGNOSIS — G4733 Obstructive sleep apnea (adult) (pediatric): Secondary | ICD-10-CM

## 2015-03-09 DIAGNOSIS — I350 Nonrheumatic aortic (valve) stenosis: Secondary | ICD-10-CM

## 2015-03-09 HISTORY — DX: Obstructive sleep apnea (adult) (pediatric): G47.33

## 2015-03-09 HISTORY — DX: Vitamin D deficiency, unspecified: E55.9

## 2015-03-09 HISTORY — DX: Nonrheumatic aortic (valve) stenosis: I35.0

## 2015-03-09 HISTORY — DX: Allergic rhinitis, unspecified: J30.9

## 2015-03-09 HISTORY — DX: Age-related osteoporosis without current pathological fracture: M81.0

## 2015-03-09 HISTORY — DX: Other neuromuscular dysfunction of bladder: N31.8

## 2015-03-09 HISTORY — DX: Gastro-esophageal reflux disease without esophagitis: K21.9

## 2015-03-31 DIAGNOSIS — M1712 Unilateral primary osteoarthritis, left knee: Secondary | ICD-10-CM

## 2015-03-31 HISTORY — DX: Unilateral primary osteoarthritis, left knee: M17.12

## 2015-07-16 DIAGNOSIS — I272 Pulmonary hypertension, unspecified: Secondary | ICD-10-CM | POA: Insufficient documentation

## 2015-07-16 DIAGNOSIS — I517 Cardiomegaly: Secondary | ICD-10-CM

## 2015-07-16 DIAGNOSIS — K589 Irritable bowel syndrome without diarrhea: Secondary | ICD-10-CM | POA: Insufficient documentation

## 2015-07-16 DIAGNOSIS — G4700 Insomnia, unspecified: Secondary | ICD-10-CM | POA: Insufficient documentation

## 2015-07-16 HISTORY — DX: Cardiomegaly: I51.7

## 2015-07-16 HISTORY — DX: Insomnia, unspecified: G47.00

## 2015-07-16 HISTORY — DX: Pulmonary hypertension, unspecified: I27.20

## 2015-07-16 HISTORY — DX: Irritable bowel syndrome, unspecified: K58.9

## 2015-07-16 LAB — HM COLONOSCOPY

## 2015-08-21 DIAGNOSIS — M25511 Pain in right shoulder: Secondary | ICD-10-CM | POA: Insufficient documentation

## 2015-08-22 LAB — HM MAMMOGRAPHY: HM Mammogram: NORMAL (ref 0–4)

## 2015-09-09 DIAGNOSIS — S8001XA Contusion of right knee, initial encounter: Secondary | ICD-10-CM | POA: Insufficient documentation

## 2015-09-09 DIAGNOSIS — M6281 Muscle weakness (generalized): Secondary | ICD-10-CM | POA: Insufficient documentation

## 2015-09-24 ENCOUNTER — Ambulatory Visit: Payer: Medicare Other | Attending: Family Medicine | Admitting: Physical Therapy

## 2015-09-24 DIAGNOSIS — Z9181 History of falling: Secondary | ICD-10-CM | POA: Diagnosis present

## 2015-09-24 DIAGNOSIS — R2689 Other abnormalities of gait and mobility: Secondary | ICD-10-CM | POA: Insufficient documentation

## 2015-09-24 DIAGNOSIS — R2681 Unsteadiness on feet: Secondary | ICD-10-CM

## 2015-09-24 DIAGNOSIS — R42 Dizziness and giddiness: Secondary | ICD-10-CM | POA: Diagnosis present

## 2015-09-24 NOTE — Therapy (Signed)
[facility] [address]  [place], [place], [address] Phone: [phone]   Fax:  [phone]  Physical Therapy Evaluation  Patient Details  Name: Jasmin [name] MRN: [id number] Date of Birth: 1945/09/19 Referring Provider: Avon Gully, MD  Encounter Date: 09/24/2015      PT End of Session - 09/24/15 0840    Visit Number 1   Number of Visits 12   Date for PT Re-Evaluation 11/05/15   PT Start Time 0800   PT Stop Time 0838   PT Time Calculation (min) 38 min   Equipment Utilized During Treatment Gait belt   Activity Tolerance Patient tolerated treatment well   Behavior During Therapy [facility] for tasks assessed/performed      Past Medical History:  Diagnosis Date  . Arthritis   . Diabetes mellitus without complication   . Hypertension   . Sleep apnea     Past Surgical History:  Procedure Laterality Date  . FRACTURE SURGERY Left 1953   L arm  . FRACTURE SURGERY Left 1985   L ankle  . JOINT REPLACEMENT Right 2012   R TKA  . R foot/ankle Right 2013 and 2014   plate and screws lateral foot and tendon removal medial foot in 2013, revision in 2014    There were no vitals filed for this visit.       Subjective Assessment - 09/24/15 0801    Subjective Pt is a 70 y/o female who presents to OPPT with balance deficits.  Pt reports weekend of 7/8 went to mountains and upon coming home developed L sided weakness and dizziness on 09/01/15.  Pt had fall that evening; went to urgent care next day dx with "inner ear" dysfunction due to altitude changes from going to mountains.  Followed up with MD next week and MRI revealed a small CVA.  Pt reports dizziness mostly resolved but still feels off balance and with quick turns mild dizziness   Pertinent History L TKA 04/07/15, R TKA, aortic stenosis, HTN, T2DM, HLD, osteoporosis, vit D deficiency, Pulmonary HTN   Diagnostic tests MRI: R subacute MCA CVA   Patient Stated  Goals improve balance   Currently in Pain? Yes   Pain Score 9   up to 8-9, currently no pain   Pain Location Knee   Pain Orientation Left   Pain Descriptors / Indicators Sharp   Pain Type Chronic pain   Pain Onset More than a month ago   Pain Frequency Intermittent   Aggravating Factors  walking   Pain Relieving Factors sitting   Effect of Pain on Daily Activities will monitor but will not directly address            [facility] PT Assessment - 09/24/15 0808      Assessment   Medical Diagnosis R CVA   Referring Provider Avon Gully, MD   Onset Date/Surgical Date 09/01/15   Hand Dominance Right   Next MD Visit PRN   Prior Therapy at this clinic for R knee and ankle     Precautions   Precautions Fall     Restrictions   Weight Bearing Restrictions No     Balance Screen   Has the patient fallen in the past 6 months Yes   How many times? 1 - tripped stepping up on curve (on same day as CVA)   Has the patient had a decrease in activity level because of a fear of falling?  No  Is the patient reluctant to leave their home because of a fear of falling?  No     Home Environment   Living Environment Private residence   Living Arrangements Spouse/significant other;Children   Available Help at Discharge Family   Type of [place] Access Level entry   [place] Two level;Able to live on main level with bedroom/bathroom   Additional Comments has 10 steps out back deck - tries to perform 6 times a day for exercise; has trouble getting out of tub (stepping over ledge)     Prior Function   Level of Independence Independent   Vocation Retired   [employer] retired Education officer, museum   Leisure gardening     Cognition   Overall Cognitive Status Within Functional Limits for tasks assessed     Observation/Other Assessments   Focus on Therapeutic Outcomes (FOTO)  60 (40% limited; predicted 19% limited)     Strength   Overall Strength Comments tested in  sitting   Strength Assessment Site Hip;Knee;Ankle   Right Hip Flexion 4/5   Left Hip Flexion 3/5   Right Knee Flexion 4/5   Right Knee Extension 4/5   Left Knee Flexion 4/5   Left Knee Extension 3+/5   Right Ankle Dorsiflexion 5/5   Left Ankle Dorsiflexion 4/5     Ambulation/Gait   Ambulation/Gait Yes   Ambulation/Gait Assistance 5: Supervision   Ambulation Distance (Feet) 120 Feet   Assistive device None   Gait Pattern Step-through pattern;Decreased stance time - left;Decreased step length - right;Lateral hip instability;Trendelenburg;Antalgic   Gait velocity 2.84 ft/sec  11.56 sec     Standardized Balance Assessment   Standardized Balance Assessment Berg Balance Test;Timed Up and Go Test     Berg Balance Test   Sit to Stand Able to stand without using hands and stabilize independently   Standing Unsupported Able to stand safely 2 minutes   Sitting with Back Unsupported but Feet Supported on Floor or Stool Able to sit safely and securely 2 minutes   Stand to Sit Sits safely with minimal use of hands   Transfers Able to transfer safely, minor use of hands   Standing Unsupported with Eyes Closed Able to stand 10 seconds with supervision   Standing Ubsupported with Feet Together Able to place feet together independently and stand for 1 minute with supervision   From Standing, Reach Forward with Outstretched Arm Can reach forward >12 cm safely (5")   From Standing Position, Pick up Object from Floor Able to pick up shoe, needs supervision   From Standing Position, Turn to Look Behind Over each Shoulder Looks behind from both sides and weight shifts well   Turn 360 Degrees Able to turn 360 degrees safely but slowly   Standing Unsupported, Alternately Place Feet on Step/Stool Able to complete 4 steps without aid or supervision   Standing Unsupported, One Foot in Front Able to plae foot ahead of the other independently and hold 30 seconds   Standing on One Leg Tries to lift leg/unable  to hold 3 seconds but remains standing independently   Total Score 44     Timed Up and Go Test   Normal TUG (seconds) 12.19                           PT Education - 09/24/15 0838    Education provided Yes   Education Details clinical findings; goals of care/POC   Person(s)  Educated Patient   Methods Explanation   Comprehension Verbalized understanding          PT Short Term Goals - 09/24/15 0844      PT SHORT TERM GOAL #1   Title n/a           PT Long Term Goals - 09/24/15 0843      PT LONG TERM GOAL #1   Title independent with HEP (11/05/15)   Time 6   Period Weeks   Status New     PT LONG TERM GOAL #2   Title improve BERG balance score to >/= 48/56 for improved balance and decreased fall risk (11/05/15)   Time 6   Period Weeks   Status New     PT LONG TERM GOAL #3   Title improve timed up and go to < 10 sec for improved function and mobility (11/05/15)   Time 6   Period Weeks   Status New     PT LONG TERM GOAL #4   Title improve gait velocity to > 3.2 ft/sec for improved ambulation and mobility (11/05/15)   Time 6   Period Weeks   Status New     PT LONG TERM GOAL #5   Title negotiate stairs with 1 handrail modified independently for improved function (11/05/15)   Time 6   Period Weeks   Status New               Plan - 09/24/15 0840    Clinical Impression Statement Pt is a 70 y/o female who presents to OPPT 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  Pt will benefit from PT to maximize function and independence.  Pt reports vertigo mostly resolved so will address as needed but will incorporate vestibular balance exercises to address imbalance monitor and assess vertigo as needed.   Rehab Potential Good   PT Frequency 2x / week   PT Duration 6 weeks   PT Treatment/Interventions ADLs/Self Care Home Management;Canalith Repostioning;Neuromuscular re-education;Balance  training;Therapeutic exercise;Therapeutic activities;Functional mobility training;Stair training;Gait training;Patient/family education;Visual/perceptual remediation/compensation;Vestibular   PT Next Visit Plan issue HEP for LE strengthening and corner balance   Consulted and Agree with Plan of Care Patient      Patient will benefit from skilled therapeutic intervention in order to improve the following deficits and impairments:  Abnormal gait, Decreased balance, Decreased mobility, Dizziness, Decreased strength  Visit Diagnosis: Other abnormalities of gait and mobility - Plan: PT plan of care cert/re-cert  History of falling - Plan: PT plan of care cert/re-cert  Unsteadiness on feet - Plan: PT plan of care cert/re-cert  Dizziness and giddiness - Plan: PT plan of care cert/re-cert      G-Codes - [id number]    Functional Assessment Tool Used BERG 44/56   Functional Limitation Mobility: Walking and moving around   Mobility: Walking and Moving Around Current Status [id number]) At least 20 percent but less than 40 percent impaired, limited or restricted   Mobility: Walking and Moving Around Goal Status [id number]) At least 1 percent but less than 20 percent impaired, limited or restricted       Problem List There are no active problems to display for this patient.      Laureen Abrahams, PT, DPT 09/24/15 12:57 PM    [facility] [address]  [place], [place], [address] Phone: [phone]   Fax:  [phone]  Name: Jasmin  [name] MRN: [id number] Date of Birth: [birth year]

## 2015-09-28 ENCOUNTER — Ambulatory Visit: Payer: Medicare Other

## 2015-09-28 DIAGNOSIS — R2689 Other abnormalities of gait and mobility: Secondary | ICD-10-CM

## 2015-09-28 DIAGNOSIS — Z9181 History of falling: Secondary | ICD-10-CM

## 2015-09-28 DIAGNOSIS — R42 Dizziness and giddiness: Secondary | ICD-10-CM

## 2015-09-28 DIAGNOSIS — R2681 Unsteadiness on feet: Secondary | ICD-10-CM

## 2015-09-28 NOTE — Therapy (Signed)
[facility] [address]  [place], [place], [address] Phone: [phone]   Fax:  [phone]  Physical Therapy Treatment  Patient Details  Name: Jasmin [name] MRN: [id number] Date of Birth: 22-Jul-1945 Referring Provider: Avon Gully, MD  Encounter Date: 09/28/2015      PT End of Session - 09/28/15 0953    Visit Number 2   Number of Visits 12   Date for PT Re-Evaluation 11/05/15   PT Start Time 0938   PT Stop Time 1017   PT Time Calculation (min) 39 min   Equipment Utilized During Treatment Gait belt   Activity Tolerance Patient tolerated treatment well   Behavior During Therapy [facility] for tasks assessed/performed      Past Medical History:  Diagnosis Date  . Arthritis   . Diabetes mellitus without complication   . Hypertension   . Sleep apnea     Past Surgical History:  Procedure Laterality Date  . FRACTURE SURGERY Left 1953   L arm  . FRACTURE SURGERY Left 1985   L ankle  . JOINT REPLACEMENT Right 2012   R TKA  . R foot/ankle Right 2013 and 2014   plate and screws lateral foot and tendon removal medial foot in 2013, revision in 2014    There were no vitals filed for this visit.      Subjective Assessment - 09/28/15 0944    Subjective Pt. reports she climbs the front stairs outside her house 3 or 4 times a day; only tenderness in B knees initially today.     Patient Stated Goals improve balance   Currently in Pain? No/denies   Multiple Pain Sites No       Today's Treatment:  Therex: NuStep: level 4, 6 min  Neuro Re-ed: Alternating toe-touch onto 8" step x 10 reps each side; close CGA from therapist Alternating toe-touch onto 8" step standing on airex pad x 10 reps each side; close CGA from therapist; 1 pole assist Alternating toe-touch onto 8" step standing on airex pad x 10 reps each side; close CGA from therapist  HEP creation: Hooklying bridge x 10 reps  Hooklying bridge  with abduction with blue TB x 10 reps  L SLR x 10 reps Rhomberg corner balance with head turns side<>side, up<>down x 30 sec each Corner balance on pillow with head turns side<>side, up<>down x 30 sec each         PT Education - 09/28/15 1030    Education Details corner balance, rhomberg corner balance, bridge, bridge with abduction with blue TB, SLR    Person(s) Educated Patient   Methods Handout;Explanation   Comprehension Verbalized understanding;Verbal cues required;Need further instruction          PT Short Term Goals - 09/24/15 0844      PT SHORT TERM GOAL #1   Title n/a           PT Long Term Goals - 09/28/15 0948      PT LONG TERM GOAL #1   Title independent with HEP (11/05/15)   Time 6   Period Weeks   Status On-going     PT LONG TERM GOAL #2   Title improve BERG balance score to >/= 48/56 for improved balance and decreased fall risk (11/05/15)   Time 6   Period Weeks   Status On-going     PT LONG TERM GOAL #3   Title improve timed up and go to < 10  sec for improved function and mobility (11/05/15)   Time 6   Period Weeks   Status On-going     PT LONG TERM GOAL #4   Title improve gait velocity to > 3.2 ft/sec for improved ambulation and mobility (11/05/15)   Time 6   Period Weeks   Status On-going     PT LONG TERM GOAL #5   Title negotiate stairs with 1 handrail modified independently for improved function (11/05/15)   Time 6   Period Weeks   Status On-going               Plan - 09/28/15 0954    Clinical Impression Statement Today's treatment focused on introduction to standing balance activity and creation and demonstration of HEP; HEP was issued to pt. today with corner balance, bridging, and L SLR activities; failure today to issue blue looped TB for HEP; will plan to do this next treatment.  Pt. tolerated all activities well without pain or fatigue.     PT Treatment/Interventions ADLs/Self Care Home Management;Canalith  Repostioning;Neuromuscular re-education;Balance training;Therapeutic exercise;Therapeutic activities;Functional mobility training;Stair training;Gait training;Patient/family education;Visual/perceptual remediation/compensation;Vestibular   PT Next Visit Plan plan to issue blue looped TB for HEP; issue HEP for LE strengthening and corner balance      Patient will benefit from skilled therapeutic intervention in order to improve the following deficits and impairments:  Abnormal gait, Decreased balance, Decreased mobility, Dizziness, Decreased strength  Visit Diagnosis: Other abnormalities of gait and mobility  History of falling  Unsteadiness on feet  Dizziness and giddiness     Problem List There are no active problems to display for this patient.   [name], PTA 09/28/2015, 12:38 PM  [facility] [address]  [place], [place], [address] Phone: [phone]   Fax:  [phone]  Name: Jasmin [name] MRN: [id number] Date of Birth: 10-Jan-1946

## 2015-10-05 ENCOUNTER — Ambulatory Visit: Payer: Medicare Other | Admitting: Physical Therapy

## 2015-10-05 DIAGNOSIS — R42 Dizziness and giddiness: Secondary | ICD-10-CM

## 2015-10-05 DIAGNOSIS — R2689 Other abnormalities of gait and mobility: Secondary | ICD-10-CM | POA: Diagnosis not present

## 2015-10-05 DIAGNOSIS — R2681 Unsteadiness on feet: Secondary | ICD-10-CM

## 2015-10-05 DIAGNOSIS — Z9181 History of falling: Secondary | ICD-10-CM

## 2015-10-05 NOTE — Therapy (Signed)
[facility] [address]  [place], [place], [address] Phone: [phone]   Fax:  [phone]  Physical Therapy Treatment  Patient Details  Name: Jasmin [name] MRN: [id number] Date of Birth: [birth year] Referring Provider: Avon Gully, MD  Encounter Date: 10/05/2015      PT End of Session - 10/05/15 0926    Visit Number 3   Number of Visits 12   Date for PT Re-Evaluation 11/05/15   PT Start Time 0845   PT Stop Time 0927   PT Time Calculation (min) 42 min   Activity Tolerance Patient tolerated treatment well   Behavior During Therapy [facility] for tasks assessed/performed      Past Medical History:  Diagnosis Date  . Arthritis   . Diabetes mellitus without complication   . Hypertension   . Sleep apnea     Past Surgical History:  Procedure Laterality Date  . FRACTURE SURGERY Left 1953   L arm  . FRACTURE SURGERY Left 1985   L ankle  . JOINT REPLACEMENT Right 2012   R TKA  . R foot/ankle Right 2013 and 2014   plate and screws lateral foot and tendon removal medial foot in 2013, revision in 2014    There were no vitals filed for this visit.      Subjective Assessment - 10/05/15 0848    Subjective went to [facility] this weekend; walked a lot   Pertinent History L TKA 04/07/15, R TKA, aortic stenosis, HTN, T2DM, HLD, osteoporosis, vit D deficiency, Pulmonary HTN   Diagnostic tests MRI: R subacute MCA CVA   Patient Stated Goals improve balance   Currently in Pain? No/denies                         [facility] Adult PT Treatment/Exercise - 10/05/15 0851      Exercises   Exercises Knee/Hip     Knee/Hip Exercises: Aerobic   Nustep L7 x 6 min     Knee/Hip Exercises: Seated   Marching Both;15 reps   Marching Limitations blue theraband   Sit to Sand 10 reps;with UE support  on compliant surface     Knee/Hip Exercises: Supine   Bridges Both;10 reps   Bridges with Clamshell Both;10  reps   Straight Leg Raises Both;10 reps             Balance Exercises - 10/05/15 0857      Balance Exercises: Standing   Standing Eyes Opened Narrow base of support (BOS);2 reps;30 secs;Head turns   Standing Eyes Closed Foam/compliant surface;Narrow base of support (BOS);Head turns             PT Short Term Goals - 09/24/15 0844      PT SHORT TERM GOAL #1   Title n/a           PT Long Term Goals - 09/28/15 0948      PT LONG TERM GOAL #1   Title independent with HEP (11/05/15)   Time 6   Period Weeks   Status On-going     PT LONG TERM GOAL #2   Title improve BERG balance score to >/= 48/56 for improved balance and decreased fall risk (11/05/15)   Time 6   Period Weeks   Status On-going     PT LONG TERM GOAL #3   Title improve timed up and go to < 10 sec for improved function and mobility (11/05/15)  Time 6   Period Weeks   Status On-going     PT LONG TERM GOAL #4   Title improve gait velocity to > 3.2 ft/sec for improved ambulation and mobility (11/05/15)   Time 6   Period Weeks   Status On-going     PT LONG TERM GOAL #5   Title negotiate stairs with 1 handrail modified independently for improved function (11/05/15)   Time 6   Period Weeks   Status On-going               Plan - 10/05/15 0927    Clinical Impression Statement Pt tolerated exercises well today and progressed corner balance exercises to compliant surface.  Will continue to benefit from PT to maximize function.   PT Treatment/Interventions ADLs/Self Care Home Management;Canalith Repostioning;Neuromuscular re-education;Balance training;Therapeutic exercise;Therapeutic activities;Functional mobility training;Stair training;Gait training;Patient/family education;Visual/perceptual remediation/compensation;Vestibular   PT Next Visit Plan continue balance and strengthening; standing exercises   Consulted and Agree with Plan of Care Patient      Patient will benefit from skilled  therapeutic intervention in order to improve the following deficits and impairments:  Abnormal gait, Decreased balance, Decreased mobility, Dizziness, Decreased strength  Visit Diagnosis: Other abnormalities of gait and mobility  History of falling  Unsteadiness on feet  Dizziness and giddiness     Problem List There are no active problems to display for this patient.      Laureen Abrahams, PT, DPT 10/05/15 9:29 AM    [facility] [address]  [place], [place], [address] Phone: [phone]   Fax:  [phone]  Name: Jasmin [name] MRN: [id number] Date of Birth: [birth year]

## 2015-10-08 ENCOUNTER — Ambulatory Visit: Payer: Medicare Other

## 2015-10-08 DIAGNOSIS — R2689 Other abnormalities of gait and mobility: Secondary | ICD-10-CM

## 2015-10-08 DIAGNOSIS — R2681 Unsteadiness on feet: Secondary | ICD-10-CM

## 2015-10-08 DIAGNOSIS — Z9181 History of falling: Secondary | ICD-10-CM

## 2015-10-08 DIAGNOSIS — R42 Dizziness and giddiness: Secondary | ICD-10-CM

## 2015-10-08 NOTE — Therapy (Signed)
[name] [address]  [name], [place], [address] Phone: [phone]   Fax:  [phone]  Physical Therapy Treatment  Patient Details  Name: Jasmin [name] MRN: [id number] Date of Birth: 1945/08/06 Referring Provider: Avon Gully, MD  Encounter Date: 10/08/2015      PT End of Session - 10/08/15 0853    Visit Number 4   Number of Visits 12   Date for PT Re-Evaluation 11/05/15   PT Start Time 0847   PT Stop Time 0930   PT Time Calculation (min) 43 min   Activity Tolerance Patient tolerated treatment well   Behavior During Therapy [name] for tasks assessed/performed      Past Medical History:  Diagnosis Date  . Arthritis   . Diabetes mellitus without complication   . Hypertension   . Sleep apnea     Past Surgical History:  Procedure Laterality Date  . FRACTURE SURGERY Left 1953   L arm  . FRACTURE SURGERY Left 1985   L ankle  . JOINT REPLACEMENT Right 2012   R TKA  . R foot/ankle Right 2013 and 2014   plate and screws lateral foot and tendon removal medial foot in 2013, revision in 2014    There were no vitals filed for this visit.      Subjective Assessment - 10/08/15 0851    Subjective Pt. report she has been able to perform the HEP consistently even over this past weekend in [name]    Patient Stated Goals improve balance   Currently in Pain? No/denies   Pain Score 0-No pain   Multiple Pain Sites No      Today's treatment:  Therex: NuStep: level 5, 5 min Hooklying bridge x 15 reps Hooklying L LE march with blue TB x 10 reps Hooklying sustained bridge with L hip abduction/ER with blue TB  3 x 5 reps  R sidelying L hip clam shell x 15 reps   Neuro Re-ed: Alternating toe-touch onto 8" step x 10 reps each side; close CGA from therapist Alternating toe-touch onto 8" step standing on airex pad x 10 reps each side; close CGA from therapist; 1 pole assist  Alternating toe-touch  onto 8" step standing on airex pad x 10 reps each side; close CGA from therapist  Alternating lunge onto BOSU ball (up) with alternating reach to cones on bolster x 10 reps; close CGA from therapist          PT Short Term Goals - 09/24/15 0844      PT SHORT TERM GOAL #1   Title n/a           PT Long Term Goals - 09/28/15 0948      PT LONG TERM GOAL #1   Title independent with HEP (11/05/15)   Time 6   Period Weeks   Status On-going     PT LONG TERM GOAL #2   Title improve BERG balance score to >/= 48/56 for improved balance and decreased fall risk (11/05/15)   Time 6   Period Weeks   Status On-going     PT LONG TERM GOAL #3   Title improve timed up and go to < 10 sec for improved function and mobility (11/05/15)   Time 6   Period Weeks   Status On-going     PT LONG TERM GOAL #4   Title improve gait velocity to > 3.2 ft/sec for improved ambulation and mobility (11/05/15)  Time 6   Period Weeks   Status On-going     PT LONG TERM GOAL #5   Title negotiate stairs with 1 handrail modified independently for improved function (11/05/15)   Time 6   Period Weeks   Status On-going               Plan - 10/08/15 [id number]    Clinical Impression Statement Pt. report she has been able to perform the HEP consistently even over this past weekend in [name].  Today's treatment focused on standing hip / knee strengthening activity.  Pt. tolerated this well without an increase in pain.  Pt. to return to treatment on 8/21.    PT Treatment/Interventions ADLs/Self Care Home Management;Canalith Repostioning;Neuromuscular re-education;Balance training;Therapeutic exercise;Therapeutic activities;Functional mobility training;Stair training;Gait training;Patient/family education;Visual/perceptual remediation/compensation;Vestibular   PT Next Visit Plan continue balance and strengthening; standing exercises      Patient will benefit from skilled therapeutic intervention in order to  improve the following deficits and impairments:  Abnormal gait, Decreased balance, Decreased mobility, Dizziness, Decreased strength  Visit Diagnosis: Other abnormalities of gait and mobility  History of falling  Unsteadiness on feet  Dizziness and giddiness     Problem List There are no active problems to display for this patient.   [name], PTA 10/08/2015, 2:45 PM  [name] [address]  [place], [place], [address] Phone: [phone]   Fax:  [phone]  Name: Jasmin [name] MRN: [id number] Date of Birth: 1946/02/18

## 2015-10-12 ENCOUNTER — Ambulatory Visit: Payer: Medicare Other | Admitting: Physical Therapy

## 2015-10-12 DIAGNOSIS — R2689 Other abnormalities of gait and mobility: Secondary | ICD-10-CM | POA: Diagnosis not present

## 2015-10-12 DIAGNOSIS — R2681 Unsteadiness on feet: Secondary | ICD-10-CM

## 2015-10-12 DIAGNOSIS — Z9181 History of falling: Secondary | ICD-10-CM

## 2015-10-12 DIAGNOSIS — R42 Dizziness and giddiness: Secondary | ICD-10-CM

## 2015-10-12 NOTE — Therapy (Signed)
[facility] [address]  [place], [place], [address] Phone: [phone]   Fax:  [phone]  Physical Therapy Treatment  Patient Details  Name: Jasmin [name] MRN: [id number] Date of Birth: 07-03-1945 Referring Provider: Avon Gully, MD  Encounter Date: 10/12/2015      PT End of Session - 10/12/15 0921    Visit Number 5   Number of Visits 12   Date for PT Re-Evaluation 11/05/15   PT Start Time 0842   PT Stop Time 0920   PT Time Calculation (min) 38 min   Equipment Utilized During Treatment Gait belt   Activity Tolerance Patient tolerated treatment well   Behavior During Therapy [facility] for tasks assessed/performed      Past Medical History:  Diagnosis Date  . Arthritis   . Diabetes mellitus without complication   . Hypertension   . Sleep apnea     Past Surgical History:  Procedure Laterality Date  . FRACTURE SURGERY Left 1953   L arm  . FRACTURE SURGERY Left 1985   L ankle  . JOINT REPLACEMENT Right 2012   R TKA  . R foot/ankle Right 2013 and 2014   plate and screws lateral foot and tendon removal medial foot in 2013, revision in 2014    There were no vitals filed for this visit.      Subjective Assessment - 10/12/15 0843    Subjective doing well today.  feels fine.   Pertinent History L TKA 04/07/15, R TKA, aortic stenosis, HTN, T2DM, HLD, osteoporosis, vit D deficiency, Pulmonary HTN   Diagnostic tests MRI: R subacute MCA CVA   Patient Stated Goals improve balance   Currently in Pain? No/denies                         [facility] Adult PT Treatment/Exercise - 10/12/15 0843      Ambulation/Gait   Stairs Yes   Stairs Assistance 5: Supervision   Stair Management Technique One rail Left;Step to pattern;Forwards   Number of Stairs 4   Height of Stairs 8     Knee/Hip Exercises: Aerobic   Nustep L7 x 6 min     Knee/Hip Exercises: Machines for Strengthening   Cybex Knee  Extension 20# bil concentric LLE only eccentric x 15; LLE only x 15 20#     Knee/Hip Exercises: Standing   Forward Step Up Left;10 reps;Hand Hold: 2;Step Height: 4"   Forward Step Up Limitations cues for increased L quad activation and decreased RLE compensations   SLS with Vectors single tap/double tap to 8" cones with min A   Walking with Sports Cord forwards and laterally with unilateral pulls                PT Education - 10/12/15 0921    Education provided Yes   Education Details progress corner balance HEP to have full field stimulus (ex TV on)   Person(s) Educated Patient   Methods Explanation   Comprehension Verbalized understanding          PT Short Term Goals - 09/24/15 0844      PT SHORT TERM GOAL #1   Title n/a           PT Long Term Goals - 09/28/15 0948      PT LONG TERM GOAL #1   Title independent with HEP (11/05/15)   Time 6   Period Weeks   Status On-going  PT LONG TERM GOAL #2   Title improve BERG balance score to >/= 48/56 for improved balance and decreased fall risk (11/05/15)   Time 6   Period Weeks   Status On-going     PT LONG TERM GOAL #3   Title improve timed up and go to < 10 sec for improved function and mobility (11/05/15)   Time 6   Period Weeks   Status On-going     PT LONG TERM GOAL #4   Title improve gait velocity to > 3.2 ft/sec for improved ambulation and mobility (11/05/15)   Time 6   Period Weeks   Status On-going     PT LONG TERM GOAL #5   Title negotiate stairs with 1 handrail modified independently for improved function (11/05/15)   Time 6   Period Weeks   Status On-going               Plan - 10/12/15 [id number]    Clinical Impression Statement Pt tolerated session well and fatigued at end of session.  L quad weakness affecting balance and stairs negotiation.  Will continue to benefit from PT to maximize function.   PT Treatment/Interventions ADLs/Self Care Home Management;Canalith  Repostioning;Neuromuscular re-education;Balance training;Therapeutic exercise;Therapeutic activities;Functional mobility training;Stair training;Gait training;Patient/family education;Visual/perceptual remediation/compensation;Vestibular   PT Next Visit Plan continue balance and strengthening; standing exercises      Patient will benefit from skilled therapeutic intervention in order to improve the following deficits and impairments:  Abnormal gait, Decreased balance, Decreased mobility, Dizziness, Decreased strength  Visit Diagnosis: Other abnormalities of gait and mobility  History of falling  Unsteadiness on feet  Dizziness and giddiness     Problem List There are no active problems to display for this patient.      Laureen Abrahams, PT, DPT 10/12/15 9:23 AM    [facility] [address]  [place], [place], [address] Phone: [phone]   Fax:  [phone]  Name: Jasmin [name] MRN: [id number] Date of Birth: 09-18-1945

## 2015-10-15 ENCOUNTER — Ambulatory Visit: Payer: Medicare Other

## 2015-10-15 ENCOUNTER — Encounter (INDEPENDENT_AMBULATORY_CARE_PROVIDER_SITE_OTHER): Payer: Self-pay

## 2015-10-15 DIAGNOSIS — R2689 Other abnormalities of gait and mobility: Secondary | ICD-10-CM | POA: Diagnosis not present

## 2015-10-15 DIAGNOSIS — R42 Dizziness and giddiness: Secondary | ICD-10-CM

## 2015-10-15 DIAGNOSIS — R2681 Unsteadiness on feet: Secondary | ICD-10-CM

## 2015-10-15 DIAGNOSIS — Z9181 History of falling: Secondary | ICD-10-CM

## 2015-10-15 NOTE — Therapy (Signed)
[facility] [address]  [place], [place], [address] Phone: [phone]   Fax:  [phone]  Physical Therapy Treatment  Patient Details  Name: Saryah Ames MRN: [id number] Date of Birth: 10-17-1945 Referring Provider: Avon Gully, MD  Encounter Date: 10/15/2015      PT End of Session - 10/15/15 0852    Visit Number 6   Number of Visits 12   Date for PT Re-Evaluation 11/05/15   PT Start Time 0847   PT Stop Time 0927   PT Time Calculation (min) 40 min   Equipment Utilized During Treatment Gait belt   Activity Tolerance Patient tolerated treatment well   Behavior During Therapy [facility] for tasks assessed/performed      Past Medical History:  Diagnosis Date  . Arthritis   . Diabetes mellitus without complication   . Hypertension   . Sleep apnea     Past Surgical History:  Procedure Laterality Date  . FRACTURE SURGERY Left 1953   L arm  . FRACTURE SURGERY Left 1985   L ankle  . JOINT REPLACEMENT Right 2012   R TKA  . R foot/ankle Right 2013 and 2014   plate and screws lateral foot and tendon removal medial foot in 2013, revision in 2014    There were no vitals filed for this visit.      Subjective Assessment - 10/15/15 0851    Subjective Pt. reports the B knees are sore initially today however unable to assign a number to this.     Patient Stated Goals improve balance   Currently in Pain? No/denies   Pain Score 0-No pain   Multiple Pain Sites No      Today's treatment:  Therex: NuStep: level 6, 6 min  Neuro Re-ed: Standing alternating lunge onto BOSU ball (up) x 10 reps; 2 pole support  Alternating toe-touch onto 10" step x 10 reps each side; close CGA from therapist Alternating lunge onto BOSU ball (up) with alternating reach to cones on bolster x 10 reps; close CGA from therapist   Alternating lunge onto airex pad with alternating reach to cones on bolster x 10 reps; close CGA  from therapist  Standing on airex pad:           Feet together with alternating UE swing and side<>side head turns x 20 reps each way          Staggered standing with alternating UE swing and side<>side head turns x 20 reps each way   Therex: Hooklying LE march with black TB x 15 reps        PT Short Term Goals - 09/24/15 0844      PT SHORT TERM GOAL #1   Title n/a           PT Long Term Goals - 09/28/15 0948      PT LONG TERM GOAL #1   Title independent with HEP (11/05/15)   Time 6   Period Weeks   Status On-going     PT LONG TERM GOAL #2   Title improve BERG balance score to >/= 48/56 for improved balance and decreased fall risk (11/05/15)   Time 6   Period Weeks   Status On-going     PT LONG TERM GOAL #3   Title improve timed up and go to < 10 sec for improved function and mobility (11/05/15)   Time 6   Period Weeks   Status On-going  PT LONG TERM GOAL #4   Title improve gait velocity to > 3.2 ft/sec for improved ambulation and mobility (11/05/15)   Time 6   Period Weeks   Status On-going     PT LONG TERM GOAL #5   Title negotiate stairs with 1 handrail modified independently for improved function (11/05/15)   Time 6   Period Weeks   Status On-going               Plan - 10/15/15 [id number]    Clinical Impression Statement Pt. reports the B knees are sore initially today however unable to assign a number to this.  Stepping and reaching balance activity was the focus of today's treatment.  Pt. tolerated this very well with a decrease in knee pain.  Pt. with improved LE clearance with stepping activities today able to toe-touch on 10" riser without UE support or loss of balance.     PT Treatment/Interventions ADLs/Self Care Home Management;Canalith Repostioning;Neuromuscular re-education;Balance training;Therapeutic exercise;Therapeutic activities;Functional mobility training;Stair training;Gait training;Patient/family education;Visual/perceptual  remediation/compensation;Vestibular   PT Next Visit Plan continue balance and strengthening; standing exercises      Patient will benefit from skilled therapeutic intervention in order to improve the following deficits and impairments:  Abnormal gait, Decreased balance, Decreased mobility, Dizziness, Decreased strength  Visit Diagnosis: Other abnormalities of gait and mobility  History of falling  Unsteadiness on feet  Dizziness and giddiness     Problem List There are no active problems to display for this patient.   [name], PTA 10/15/2015, 1:08 PM  [facility] [address]  [place], [place], [address] Phone: [phone]   Fax:  [phone]  Name: Roshawn Hanni MRN: [id number] Date of Birth: 1945-07-18

## 2015-10-19 ENCOUNTER — Ambulatory Visit: Payer: Medicare Other

## 2015-10-19 DIAGNOSIS — R2689 Other abnormalities of gait and mobility: Secondary | ICD-10-CM

## 2015-10-19 DIAGNOSIS — R42 Dizziness and giddiness: Secondary | ICD-10-CM

## 2015-10-19 DIAGNOSIS — Z9181 History of falling: Secondary | ICD-10-CM

## 2015-10-19 DIAGNOSIS — R2681 Unsteadiness on feet: Secondary | ICD-10-CM

## 2015-10-19 NOTE — Therapy (Signed)
[facility] [address]  [place], [place], [address] Phone: [phone]   Fax:  [phone]  Physical Therapy Treatment  Patient Details  Name: Amena Theesfeld MRN: [id number] Date of Birth: Apr 24, 1945 Referring Provider: Avon Gully, MD  Encounter Date: 10/19/2015      PT End of Session - 10/19/15 1323    Visit Number 7   Number of Visits 12   Date for PT Re-Evaluation 11/05/15   PT Start Time 1316   PT Stop Time 1358   PT Time Calculation (min) 42 min   Equipment Utilized During Treatment Gait belt   Activity Tolerance Patient tolerated treatment well   Behavior During Therapy [facility] for tasks assessed/performed      Past Medical History:  Diagnosis Date  . Arthritis   . Diabetes mellitus without complication   . Hypertension   . Sleep apnea     Past Surgical History:  Procedure Laterality Date  . FRACTURE SURGERY Left 1953   L arm  . FRACTURE SURGERY Left 1985   L ankle  . JOINT REPLACEMENT Right 2012   R TKA  . R foot/ankle Right 2013 and 2014   plate and screws lateral foot and tendon removal medial foot in 2013, revision in 2014    There were no vitals filed for this visit.      Subjective Assessment - 10/19/15 1321    Subjective Pt. reports she is pain free with exception of occasional knee pain while standing and walking.     Patient Stated Goals improve balance   Currently in Pain? No/denies   Pain Score 0-No pain   Multiple Pain Sites No        Today's treatment:  Therex: NuStep: level 5, 6 min Hooklying LE march with black TB x 15 reps B sidelying clam shell with black TB x 15 reps each side  Hooklying L LE march with black TB x 10 reps  Hooklying bridge x 15 reps  Hooklying bridge with alternating hip abd/ER with black TB x 5 reps each side  Standing next to counter:        B hip abduction with 2# x 15 reps each way         B hip ext. With 2# x 15 reps each  way  Neuro Re-ed: Alternating toe-touch onto 10" step with 2# cuffweight x 10 reps each side; close CGA from therapist; 1 pole support  4" Step-over with focus on eccentric control with 2# x 5 each way; 2 pole support  4" side step-over with 2# x 5 each way; 2 pole support           PT Education - 10/19/15 1403    Education provided Yes   Education Details standing hip flexion, ext., sidelying clam shell with black TB issued to pt.     Person(s) Educated Patient   Methods Explanation;Handout   Comprehension Verbalized understanding;Verbal cues required          PT Short Term Goals - 09/24/15 0844      PT SHORT TERM GOAL #1   Title n/a           PT Long Term Goals - 09/28/15 0948      PT LONG TERM GOAL #1   Title independent with HEP (11/05/15)   Time 6   Period Weeks   Status On-going     PT LONG TERM GOAL #2   Title improve  BERG balance score to >/= 48/56 for improved balance and decreased fall risk (11/05/15)   Time 6   Period Weeks   Status On-going     PT LONG TERM GOAL #3   Title improve timed up and go to < 10 sec for improved function and mobility (11/05/15)   Time 6   Period Weeks   Status On-going     PT LONG TERM GOAL #4   Title improve gait velocity to > 3.2 ft/sec for improved ambulation and mobility (11/05/15)   Time 6   Period Weeks   Status On-going     PT LONG TERM GOAL #5   Title negotiate stairs with 1 handrail modified independently for improved function (11/05/15)   Time 6   Period Weeks   Status On-going               Plan - 10/19/15 1322    Clinical Impression Statement Today's treatment focused on L hip flexion/extension strengthening activities.  Pt. performed well with all balance activity today able to demo control with 10" toe-touch while standing on compliant surface.  Pt. balance continues improve.  Pt. to return to therapy on 8/30.   PT Treatment/Interventions ADLs/Self Care Home Management;Canalith  Repostioning;Neuromuscular re-education;Balance training;Therapeutic exercise;Therapeutic activities;Functional mobility training;Stair training;Gait training;Patient/family education;Visual/perceptual remediation/compensation;Vestibular   PT Next Visit Plan continue balance and strengthening; standing exercises      Patient will benefit from skilled therapeutic intervention in order to improve the following deficits and impairments:  Abnormal gait, Decreased balance, Decreased mobility, Dizziness, Decreased strength  Visit Diagnosis: Other abnormalities of gait and mobility  History of falling  Unsteadiness on feet  Dizziness and giddiness     Problem List There are no active problems to display for this patient.   [name], PTA 10/19/2015, 3:32 PM  [facility] [address]  [place], [place], [address] Phone: [phone]   Fax:  [phone]  Name: Raschelle Manjarres MRN: [id number] Date of Birth: June 14, 1945

## 2015-10-22 ENCOUNTER — Ambulatory Visit: Payer: Medicare Other | Admitting: Physical Therapy

## 2015-10-22 DIAGNOSIS — R2681 Unsteadiness on feet: Secondary | ICD-10-CM

## 2015-10-22 DIAGNOSIS — Z9181 History of falling: Secondary | ICD-10-CM

## 2015-10-22 DIAGNOSIS — R42 Dizziness and giddiness: Secondary | ICD-10-CM

## 2015-10-22 DIAGNOSIS — R2689 Other abnormalities of gait and mobility: Secondary | ICD-10-CM

## 2015-10-22 NOTE — Therapy (Addendum)
[facility] [address]  [place], [place], [address] Phone: [phone]   Fax:  [phone]  Physical Therapy Treatment  Patient Details  Name: Jasmin [name] MRN: [id number] Date of Birth: 11/08/1945 Referring Provider: Avon Gully, MD  Encounter Date: 10/22/2015      PT End of Session - 10/22/15 0921    Visit Number 8   Number of Visits 12   Date for PT Re-Evaluation 11/05/15   PT Start Time 0845   PT Stop Time 0925   PT Time Calculation (min) 40 min   Activity Tolerance Patient tolerated treatment well   Behavior During Therapy [facility] for tasks assessed/performed      Past Medical History:  Diagnosis Date  . Arthritis   . Diabetes mellitus without complication   . Hypertension   . Sleep apnea     Past Surgical History:  Procedure Laterality Date  . FRACTURE SURGERY Left 1953   L arm  . FRACTURE SURGERY Left 1985   L ankle  . JOINT REPLACEMENT Right 2012   R TKA  . R foot/ankle Right 2013 and 2014   plate and screws lateral foot and tendon removal medial foot in 2013, revision in 2014    There were no vitals filed for this visit.      Subjective Assessment - 10/22/15 0848    Subjective doing well, no complaints   Pertinent History L TKA 04/07/15, R TKA, aortic stenosis, HTN, T2DM, HLD, osteoporosis, vit D deficiency, Pulmonary HTN   Diagnostic tests MRI: R subacute MCA CVA   Patient Stated Goals improve balance   Currently in Pain? No/denies            [facility] PT Assessment - 10/22/15 0858      Observation/Other Assessments   Focus on Therapeutic Outcomes (FOTO)  74 (26% limited)     Ambulation/Gait   Gait velocity 3.33 ft/sec  9.84 sec   Stairs Yes   Stairs Assistance 6: Modified independent (Device/Increase time)   Stair Management Technique One rail Left;Forwards;Alternating pattern;Step to pattern   Number of Stairs 10   Height of Stairs 8     Berg Balance Test   Sit  to Stand Able to stand without using hands and stabilize independently   Standing Unsupported Able to stand safely 2 minutes   Sitting with Back Unsupported but Feet Supported on Floor or Stool Able to sit safely and securely 2 minutes   Stand to Sit Sits safely with minimal use of hands   Transfers Able to transfer safely, minor use of hands   Standing Unsupported with Eyes Closed Able to stand 10 seconds safely   Standing Ubsupported with Feet Together Able to place feet together independently and stand 1 minute safely   From Standing, Reach Forward with Outstretched Arm Can reach confidently >25 cm (10")   From Standing Position, Pick up Object from Floor Able to pick up shoe safely and easily   From Standing Position, Turn to Look Behind Over each Shoulder Looks behind from both sides and weight shifts well   Turn 360 Degrees Able to turn 360 degrees safely in 4 seconds or less   Standing Unsupported, Alternately Place Feet on Step/Stool Able to stand independently and safely and complete 8 steps in 20 seconds   Standing Unsupported, One Foot in Front Able to plae foot ahead of the other independently and hold 30 seconds   Standing on One Leg Tries to  lift leg/unable to hold 3 seconds but remains standing independently   Total Score 52     Timed Up and Go Test   Normal TUG (seconds) 9.65                     [facility] Adult PT Treatment/Exercise - 10/30/2015 0858      Knee/Hip Exercises: Standing   Hip Flexion Both;10 reps;Knee bent   Hip Extension Both;Knee straight;10 reps     Knee/Hip Exercises: Sidelying   Clams black theraband x 10 bil                  PT Short Term Goals - 09/24/15 0844      PT SHORT TERM GOAL #1   Title n/a           PT Long Term Goals - 10-30-15 5379      PT LONG TERM GOAL #1   Title independent with HEP (11/05/15)   Status Achieved     PT LONG TERM GOAL #2   Title improve BERG balance score to >/= 48/56 for improved balance  and decreased fall risk (11/05/15)   Status Achieved     PT LONG TERM GOAL #3   Title improve timed up and go to < 10 sec for improved function and mobility (11/05/15)   Status Achieved     PT LONG TERM GOAL #4   Title improve gait velocity to > 3.2 ft/sec for improved ambulation and mobility (11/05/15)   Status Achieved     PT LONG TERM GOAL #5   Title negotiate stairs with 1 handrail modified independently for improved function (11/05/15)   Status Achieved               Plan - 2015/10/30 4327    Clinical Impression Statement Pt has met all LTGs.  Feel pt is ready for d/c but agreeable to hold x 30 days and pt to return if needed.     PT Treatment/Interventions ADLs/Self Care Home Management;Canalith Repostioning;Neuromuscular re-education;Balance training;Therapeutic exercise;Therapeutic activities;Functional mobility training;Stair training;Gait training;Patient/family education;Visual/perceptual remediation/compensation;Vestibular   PT Next Visit Plan hold x 30 days; will need new g code and renewal if pt returns   Consulted and Agree with Plan of Care Patient      Patient will benefit from skilled therapeutic intervention in order to improve the following deficits and impairments:  Abnormal gait, Decreased balance, Decreased mobility, Dizziness, Decreased strength  Visit Diagnosis: Other abnormalities of gait and mobility  History of falling  Unsteadiness on feet  Dizziness and giddiness       G-Codes - 10/30/15 0925    Functional Assessment Tool Used BERG 52/56   Functional Limitation Mobility: Walking and moving around   Mobility: Walking and Moving Around Goal Status [phone]) At least 1 percent but less than 20 percent impaired, limited or restricted   Mobility: Walking and Moving Around Discharge Status [phone]) At least 1 percent but less than 20 percent impaired, limited or restricted      Problem List There are no active problems to display for this  patient.      Laureen Abrahams, PT, DPT 10-30-2015 9:27 AM    [facility] [address]  [place], [place], [address] Phone: [phone]   Fax:  [phone]  Name: Jasmin [name] MRN: [id number] Date of Birth: 1945/12/12     PHYSICAL THERAPY DISCHARGE SUMMARY  Visits from Start of Care: 8  Current functional level related to goals / functional outcomes: See above   Remaining deficits: N/a; all goals met and pt no longer fall risk; back to baseline mobility   Education / Equipment: HEP  Plan: Patient agrees to discharge.  Patient goals were met. Patient is being discharged due to meeting the stated rehab goals.  ?????     Laureen Abrahams, PT, DPT 11/23/15 7:34 AM  [facility] [place], [place] [address]  [phone] (office) [phone] (fax)

## 2015-10-27 ENCOUNTER — Ambulatory Visit: Payer: Medicare Other

## 2015-11-02 ENCOUNTER — Ambulatory Visit: Payer: Medicare Other

## 2015-11-10 HISTORY — DX: Morbid (severe) obesity due to excess calories: E66.01

## 2016-01-18 LAB — CBC AND DIFFERENTIAL
HCT: 39 (ref 36–46)
Hemoglobin: 13.3 (ref 12.0–16.0)
Platelets: 246 (ref 150–399)
WBC: 8.2

## 2016-01-18 LAB — LIPID PANEL
CHOLESTEROL: 145 (ref 0–200)
HDL: 49 (ref 35–70)
LDL CALC: 70
TRIGLYCERIDES: 220 — AB (ref 40–160)

## 2016-01-18 LAB — BASIC METABOLIC PANEL
BUN: 13 (ref 4–21)
CREATININE: 0.6 (ref 0.5–1.1)

## 2016-01-18 LAB — HEMOGLOBIN A1C: Hemoglobin A1C: 7.2

## 2016-01-18 LAB — HEPATIC FUNCTION PANEL
ALK PHOS: 73 (ref 25–125)
ALT: 9 (ref 7–35)
AST: 15 (ref 13–35)

## 2016-01-20 DIAGNOSIS — N3281 Overactive bladder: Secondary | ICD-10-CM

## 2016-01-20 HISTORY — DX: Overactive bladder: N32.81

## 2016-02-02 LAB — HM DEXA SCAN: HM Dexa Scan: NORMAL

## 2016-04-18 LAB — HEMOGLOBIN A1C: HEMOGLOBIN A1C: 6.9

## 2016-06-21 HISTORY — PX: CATARACT EXTRACTION: SUR2

## 2016-07-07 DIAGNOSIS — H2512 Age-related nuclear cataract, left eye: Secondary | ICD-10-CM | POA: Insufficient documentation

## 2016-07-07 HISTORY — DX: Age-related nuclear cataract, left eye: H25.12

## 2016-07-20 LAB — HM PAP SMEAR

## 2016-08-01 ENCOUNTER — Ambulatory Visit: Payer: Medicare Other | Admitting: Family Medicine

## 2016-08-03 LAB — GLUCOSE, POCT (MANUAL RESULT ENTRY): POC GLUCOSE: 129 mg/dL — AB (ref 70–99)

## 2016-08-04 ENCOUNTER — Ambulatory Visit: Payer: Medicare Other | Admitting: Family Medicine

## 2016-08-06 NOTE — Progress Notes (Addendum)
[facility] [place], [place], [place] [address] [phone]  Date:  08/08/2016   Name:  Jasmin [name]   DOB:  12/28/1945   MRN:  [id number]  PCP:  Jefm Petty, MD    Chief Complaint: Establish Care   History of Present Illness:  Jasmin [name] is a 71 y.o. very pleasant female patient who presents with the following:  Here today as a new patient - most of her family comes here and she is establishing care as well She had been a [facility] patient in the past  She feels like her health is overall good despite some recent issues and history os below She had had 2 knee replacements- only bothersome if the weather is changing  Years ago she broke her left ankle and needed a surgical repair She generally is able to walk without pain and does not have any ankle swelling  She is on both myrbetriq and also vesicare for urinary incontin- her urologist plans to do a bladder sling and hysterectomy procedure in the near future in conjunction with her GYN doctor  She has a ureter stent in place right now due to a recent stone- not having any pain currently   Dr. Posey Pronto is her GYN doctor, her urologist is in [place]  She did have a stroke in July 2017- she is now on Plavix She has recovered more or less fully from her CVA- she was in rehab for 12 weeks. 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 She mostly uses aleve  She also uses tumeric and coconut oil for her joints  She is on 10 mg of lisinopril as well for her HTN  Married, never a smoker  BP Readings  from Last 3 Encounters:  08/08/16 (!) 142/64     Patient Active Problem List   Diagnosis Date Noted  . Mixed hyperlipidemia 08/08/2016  . Controlled type 2 diabetes mellitus without complication, without long-term current use of insulin ([place]) 08/08/2016  . Essential hypertension 08/08/2016  . History of CVA (cerebrovascular accident) 08/08/2016    Past Medical History:  Diagnosis Date  . Arthritis   . Diabetes mellitus without complication ([place])   . Heart murmur   . Hyperlipidemia   . Hypertension   . Right kidney stone   . Sleep apnea   . Stroke ([place])   . UTI (urinary tract infection)     Past Surgical History:  Procedure Laterality Date  . CATARACT EXTRACTION Left   . FRACTURE SURGERY Left 1953   L arm  . FRACTURE SURGERY Left 1985   L ankle  . HERNIA REPAIR  2004  . JOINT REPLACEMENT Right 2012   R TKA  . JOINT REPLACEMENT Left 2017  . KIDNEY STONE SURGERY    . R foot/ankle Right 2013 and 2014   plate and screws lateral foot and tendon removal medial foot in 2013, revision in 2014  . URETERAL STENT PLACEMENT    . URETEROSCOPY      Social History  Substance Use Topics  . Smoking status: Never Smoker  . Smokeless tobacco: Not on file  . Alcohol use Not on file    Family History  Problem Relation Age of Onset  . Hyperlipidemia Mother   . Heart disease Mother   . Hypertension Mother   . Hypertension Father     Allergies  Allergen Reactions  . Ciprofloxacin Hives  . Metronidazole Hives  . Adhesive [Tape] Rash    Medication list has been reviewed and updated.  Current Outpatient Prescriptions on File Prior to Visit  Medication Sig Dispense Refill  . desonide (DESOWEN) 0.05 % cream Apply topically 2 (two) times daily.    [name] econazole nitrate 1 % cream Apply topically daily.    . fluticasone (VERAMYST) 27.5 MCG/SPRAY nasal spray Place 1 spray into the nose daily.    . metroNIDAZOLE (METROCREAM) 0.75 % cream Apply topically 2 (two) times daily.    [name]  oxyCODONE-acetaminophen (ROXICET) 5-325 MG/5ML solution Take by mouth every 4 (four) hours as needed for severe pain.     No current facility-administered medications on file prior to visit.     Review of Systems:  As per HPI- otherwise negative.  No CP or SOB< no fever, cough, ST, rash, nausea, vomiting or diarrhea Appetite is normal    Physical Examination: Vitals:   08/08/16 1306  BP: (!) 142/64  Pulse: 97  Temp: 97.5 F (36.4 C)   Vitals:   08/08/16 1306  Weight: 243 lb (110.2 kg)  Height: 5' 0.17" (1.528 m)   Body mass index is 47.18 kg/m. Ideal Body Weight: Weight in (lb) to have BMI = 25: 128.5  GEN: WDWN, NAD, Non-toxic, A & O x 3, obese, otherwise looks well HEENT: Atraumatic, Normocephalic. Neck supple. No masses, No LAD. Ears and Nose: No external deformity. CV: RRR, No M/G/R. No JVD. No thrill. No extra heart sounds. PULM: CTA B, no wheezes, crackles, rhonchi. No retractions. No resp. distress. No accessory muscle use. ABD: S, NT, ND, +BS. No rebound. No HSM. EXTR: No c/c/e NEURO Normal gait.  PSYCH: Normally interactive. Conversant. Not depressed or anxious appearing.  Calm demeanor.    Assessment and Plan: Mixed hyperlipidemia - Plan: atorvastatin (LIPITOR) 20 MG tablet, ezetimibe (ZETIA) 10 MG tablet, omega-3 acid ethyl esters (LOVAZA) 1 g capsule, Lipid panel  Controlled type 2 diabetes mellitus without complication, without long-term current use of insulin ([facility]) - Plan: metFORMIN (GLUCOPHAGE) 500 MG tablet, Comprehensive metabolic panel, Hemoglobin A1c  Essential hypertension - Plan: lisinopril (PRINIVIL,ZESTRIL) 10 MG tablet, Comprehensive metabolic panel  History of CVA (cerebrovascular accident) - Plan: clopidogrel (PLAVIX) 75 MG tablet  Medication monitoring encounter - Plan: CBC, Comprehensive metabolic panel  Here today to establish care and discuss her chronic health problems Did refills for her as above Check labs as above Her BP is under  reasonable control- will see how her DM and high cholesterol look  Will plan further follow- up pending labs. Plan a CPE in a few months   Signed Lamar Blinks, MD  Received her labs 6/20- all appears reasonable.  Will send her a letter   Results for orders placed or performed in visit on 08/08/16  CBC  Result Value Ref Range   WBC 8.9 4.0 - 10.5 K/uL   RBC 4.86 3.87 - 5.11 Mil/uL   Platelets 333.0 150.0 - 400.0 K/uL   Hemoglobin 14.3 12.0 - 15.0 g/dL   HCT 43.3 36.0 - 46.0 %   MCV 89.1 78.0 - 100.0  fl   MCHC 33.1 30.0 - 36.0 g/dL   RDW 15.9 (H) 11.5 - 15.5 %  Comprehensive metabolic panel  Result Value Ref Range   Sodium 136 135 - 145 mEq/L   Potassium 4.1 3.5 - 5.1 mEq/L   Chloride 100 96 - 112 mEq/L   CO2 26 19 - 32 mEq/L   Glucose, Bld 96 70 - 99 mg/dL   BUN 13 6 - 23 mg/dL   Creatinine, Ser 0.61 0.40 - 1.20 mg/dL   Total Bilirubin 0.8 0.2 - 1.2 mg/dL   Alkaline Phosphatase 61 39 - 117 U/L   AST 17 0 - 37 U/L   ALT 13 0 - 35 U/L   Total Protein 7.3 6.0 - 8.3 g/dL   Albumin 4.6 3.5 - 5.2 g/dL   Calcium 10.6 (H) 8.4 - 10.5 mg/dL   GFR 102.64 >60.00 mL/min  Hemoglobin A1c  Result Value Ref Range   Hgb A1c MFr Bld 7.1 (H) 4.6 - 6.5 %  Lipid panel  Result Value Ref Range   Cholesterol 170 0 - 200 mg/dL   Triglycerides 257.0 (H) 0.0 - 149.0 mg/dL   HDL 39.50 >39.00 mg/dL   VLDL 51.4 (H) 0.0 - 40.0 mg/dL   Total CHOL/HDL Ratio 4    NonHDL 130.24   LDL cholesterol, direct  Result Value Ref Range   Direct LDL 92.0 mg/dL

## 2016-08-08 ENCOUNTER — Ambulatory Visit (INDEPENDENT_AMBULATORY_CARE_PROVIDER_SITE_OTHER): Payer: Medicare Other | Admitting: Family Medicine

## 2016-08-08 ENCOUNTER — Encounter: Payer: Self-pay | Admitting: Behavioral Health

## 2016-08-08 ENCOUNTER — Encounter: Payer: Self-pay | Admitting: Family Medicine

## 2016-08-08 ENCOUNTER — Telehealth: Payer: Self-pay | Admitting: Behavioral Health

## 2016-08-08 VITALS — BP 142/64 | HR 97 | Temp 97.5°F | Ht 60.18 in | Wt 243.0 lb

## 2016-08-08 DIAGNOSIS — Z5181 Encounter for therapeutic drug level monitoring: Secondary | ICD-10-CM

## 2016-08-08 DIAGNOSIS — I10 Essential (primary) hypertension: Secondary | ICD-10-CM | POA: Diagnosis not present

## 2016-08-08 DIAGNOSIS — E119 Type 2 diabetes mellitus without complications: Secondary | ICD-10-CM

## 2016-08-08 DIAGNOSIS — Z8673 Personal history of transient ischemic attack (TIA), and cerebral infarction without residual deficits: Secondary | ICD-10-CM

## 2016-08-08 DIAGNOSIS — E782 Mixed hyperlipidemia: Secondary | ICD-10-CM

## 2016-08-08 HISTORY — DX: Essential (primary) hypertension: I10

## 2016-08-08 HISTORY — DX: Mixed hyperlipidemia: E78.2

## 2016-08-08 HISTORY — DX: Type 2 diabetes mellitus without complications: E11.9

## 2016-08-08 HISTORY — DX: Personal history of transient ischemic attack (TIA), and cerebral infarction without residual deficits: Z86.73

## 2016-08-08 MED ORDER — OMEGA-3-ACID ETHYL ESTERS 1 G PO CAPS: 1.0000 g | ORAL_CAPSULE | Freq: Two times a day (BID) | ORAL | 3 refills | Status: DC

## 2016-08-08 MED ORDER — EZETIMIBE 10 MG PO TABS: 10.0000 mg | ORAL_TABLET | Freq: Every day | ORAL | 3 refills | Status: DC

## 2016-08-08 MED ORDER — METFORMIN HCL 500 MG PO TABS: 500.0000 mg | ORAL_TABLET | Freq: Two times a day (BID) | ORAL | 3 refills | Status: DC

## 2016-08-08 MED ORDER — LISINOPRIL 10 MG PO TABS: 10.0000 mg | ORAL_TABLET | Freq: Every day | ORAL | 3 refills | Status: DC

## 2016-08-08 MED ORDER — ATORVASTATIN CALCIUM 20 MG PO TABS: 20.0000 mg | ORAL_TABLET | Freq: Every day | ORAL | 3 refills | Status: DC

## 2016-08-08 MED ORDER — CLOPIDOGREL BISULFATE 75 MG PO TABS: 75.0000 mg | ORAL_TABLET | Freq: Every day | ORAL | 3 refills | Status: DC

## 2016-08-08 NOTE — Patient Instructions (Signed)
It was nice to see you today!  I will be in touch with your labs asap  Please see me in 4-6 months for a physical

## 2016-08-08 NOTE — Telephone Encounter (Signed)
Pre-Visit Call completed with patient and chart updated.   Pre-Visit Info documented in Specialty Comments under SnapShot.    

## 2016-08-09 LAB — COMPREHENSIVE METABOLIC PANEL
ALBUMIN: 4.6 g/dL (ref 3.5–5.2)
ALK PHOS: 61 U/L (ref 39–117)
ALT: 13 U/L (ref 0–35)
AST: 17 U/L (ref 0–37)
BILIRUBIN TOTAL: 0.8 mg/dL (ref 0.2–1.2)
BUN: 13 mg/dL (ref 6–23)
CALCIUM: 10.6 mg/dL — AB (ref 8.4–10.5)
CHLORIDE: 100 meq/L (ref 96–112)
CO2: 26 mEq/L (ref 19–32)
CREATININE: 0.61 mg/dL (ref 0.40–1.20)
GFR: 102.64 mL/min (ref >60.00)
Glucose, Bld: 96 mg/dL (ref 70–99)
Potassium: 4.1 mEq/L (ref 3.5–5.1)
SODIUM: 136 meq/L (ref 135–145)
Total Protein: 7.3 g/dL (ref 6.0–8.3)

## 2016-08-09 LAB — CBC
HCT: 43.3 % (ref 36.0–46.0)
Hemoglobin: 14.3 g/dL (ref 12.0–15.0)
MCHC: 33.1 g/dL (ref 30.0–36.0)
MCV: 89.1 fl (ref 78.0–100.0)
PLATELETS: 333 10*3/uL (ref 150.0–400.0)
RBC: 4.86 Mil/uL (ref 3.87–5.11)
RDW: 15.9 % — ABNORMAL HIGH (ref 11.5–15.5)
WBC: 8.9 10*3/uL (ref 4.0–10.5)

## 2016-08-09 LAB — LIPID PANEL
CHOL/HDL RATIO: 4
Cholesterol: 170 mg/dL (ref 0–200)
HDL: 39.5 mg/dL (ref >39.00)
NONHDL: 130.24
TRIGLYCERIDES: 257 mg/dL — AB (ref 0.0–149.0)
VLDL: 51.4 mg/dL — ABNORMAL HIGH (ref 0.0–40.0)

## 2016-08-09 LAB — LDL CHOLESTEROL, DIRECT: Direct LDL: 92 mg/dL

## 2016-08-09 LAB — HEMOGLOBIN A1C: Hgb A1c MFr Bld: 7.1 % — ABNORMAL HIGH (ref 4.6–6.5)

## 2016-08-11 ENCOUNTER — Telehealth: Payer: Self-pay | Admitting: Family Medicine

## 2016-08-11 NOTE — Telephone Encounter (Signed)
Please let her know that we often recommend being off plavix for 5 days prior to surgery.  However I would ask her to consult with her surgeon in case he/ she has another recommendation specific for the type of operation that she is having. It may not be necessary to stop her plavix at all

## 2016-08-11 NOTE — Telephone Encounter (Signed)
Spoke to pt. Provider recommendations dicussed. Pt verbalized understanding and will follow up with surgeon.

## 2016-08-11 NOTE — Telephone Encounter (Signed)
Caller name: Relationship to patient: Self Can be reached:[phone] Pharmacy:  Reason for call: Patient is scheduled to have surgery 725/2018 and needs to know when to stop her Plavix. May leave message on VM

## 2016-08-21 DIAGNOSIS — N8110 Cystocele, unspecified: Secondary | ICD-10-CM

## 2016-08-21 DIAGNOSIS — N816 Rectocele: Secondary | ICD-10-CM | POA: Insufficient documentation

## 2016-08-21 HISTORY — DX: Rectocele: N81.6

## 2016-08-21 HISTORY — DX: Cystocele, unspecified: N81.10

## 2016-09-02 ENCOUNTER — Telehealth: Payer: Self-pay | Admitting: Family Medicine

## 2016-09-02 DIAGNOSIS — Z8673 Personal history of transient ischemic attack (TIA), and cerebral infarction without residual deficits: Secondary | ICD-10-CM

## 2016-09-02 NOTE — Telephone Encounter (Signed)
Received her records from [facility]- will scan/ abstract

## 2016-09-09 ENCOUNTER — Encounter: Payer: Self-pay | Admitting: Family Medicine

## 2016-09-09 NOTE — Progress Notes (Unsigned)
Total Chol/HDL Cholesterol: 3.0 MCV: 88.2

## 2016-09-09 NOTE — Progress Notes (Unsigned)
Total Chol/ HL Cholesterol: 4.0

## 2016-09-14 HISTORY — PX: COLPORRHAPHY: SHX921

## 2016-09-14 HISTORY — PX: LAPAROSCOPIC ASSISTED VAGINAL HYSTERECTOMY: SHX5398

## 2016-09-14 HISTORY — PX: ABDOMINAL HYSTERECTOMY: SHX81

## 2016-09-14 LAB — GLUCOSE, POCT (MANUAL RESULT ENTRY): POC Glucose: 128 mg/dl — AB (ref 70–99)

## 2016-09-21 ENCOUNTER — Encounter: Payer: Self-pay | Admitting: Family Medicine

## 2016-10-02 ENCOUNTER — Emergency Department (HOSPITAL_BASED_OUTPATIENT_CLINIC_OR_DEPARTMENT_OTHER)
Admission: EM | Admit: 2016-10-02 | Discharge: 2016-10-02 | Disposition: A | Discharge status: Home / Self Care | Payer: Medicare Other | Attending: Emergency Medicine | Admitting: Emergency Medicine

## 2016-10-02 ENCOUNTER — Encounter (HOSPITAL_BASED_OUTPATIENT_CLINIC_OR_DEPARTMENT_OTHER): Payer: Self-pay | Admitting: Adult Health

## 2016-10-02 DIAGNOSIS — Z7902 Long term (current) use of antithrombotics/antiplatelets: Secondary | ICD-10-CM | POA: Insufficient documentation

## 2016-10-02 DIAGNOSIS — Z8673 Personal history of transient ischemic attack (TIA), and cerebral infarction without residual deficits: Secondary | ICD-10-CM | POA: Diagnosis not present

## 2016-10-02 DIAGNOSIS — N939 Abnormal uterine and vaginal bleeding, unspecified: Secondary | ICD-10-CM | POA: Diagnosis present

## 2016-10-02 DIAGNOSIS — Y69 Unspecified misadventure during surgical and medical care: Secondary | ICD-10-CM | POA: Insufficient documentation

## 2016-10-02 DIAGNOSIS — E119 Type 2 diabetes mellitus without complications: Secondary | ICD-10-CM | POA: Insufficient documentation

## 2016-10-02 DIAGNOSIS — Z79899 Other long term (current) drug therapy: Secondary | ICD-10-CM | POA: Diagnosis not present

## 2016-10-02 DIAGNOSIS — T8132XA Disruption of internal operation (surgical) wound, not elsewhere classified, initial encounter: Secondary | ICD-10-CM | POA: Insufficient documentation

## 2016-10-02 DIAGNOSIS — I10 Essential (primary) hypertension: Secondary | ICD-10-CM | POA: Insufficient documentation

## 2016-10-02 DIAGNOSIS — Z7984 Long term (current) use of oral hypoglycemic drugs: Secondary | ICD-10-CM | POA: Diagnosis not present

## 2016-10-02 DIAGNOSIS — T8130XA Disruption of wound, unspecified, initial encounter: Secondary | ICD-10-CM

## 2016-10-02 LAB — URINALYSIS, ROUTINE W REFLEX MICROSCOPIC
Bilirubin Urine: NEGATIVE
Glucose, UA: NEGATIVE mg/dL
Ketones, ur: NEGATIVE mg/dL
Nitrite: NEGATIVE
Protein, ur: NEGATIVE mg/dL
SPECIFIC GRAVITY, URINE: 1.012 (ref 1.005–1.030)
pH: 7 (ref 5.0–8.0)

## 2016-10-02 LAB — URINALYSIS, MICROSCOPIC (REFLEX)

## 2016-10-02 NOTE — ED Triage Notes (Addendum)
Presents with blood in commode this AM, she reports that she believes it is coming from her rectum, but she only urinated this am and did not have a BM. THere was blood in her commode and the toilet. SHe had a cystocele, rectocele, and hystercetomy on 7/25. She denies pain at this time and reports the has not used the ibuprofen or oxycodone for one week except for twice this week after trying to get up and having severe left sided groin pain when trying to get up out of bath, the pain was very severe and she screamed, then took one pain pill and called her doctor. She was seen this week by her physicians who told her it was mostly muscle strian from the "spread eagle position" she was in while in surgery. SHe denies pain now. Is having regualr bowel movements. Denies fevers. Currently taking plavix. She denies dizziness and lightheadeness. Color is bright red blood. she is wearing a pad, there is blood on the pad as well.

## 2016-10-02 NOTE — Discharge Instructions (Signed)
1. You have some bleeding around the stitches in the 6:00 position in the vagina. Sometimes, absorbable stitches do not absorb very quickly and the tissues they are in can become irritated and slightly fragile. This can result in drainage, bleeding and some delayed healing. At this time, this is not serious. The wound is still appropriately intact. Please call your surgeon in the morning to have a recheck of the wound and have them determine if removing some of the absorbable sutures at this time would be helpful for healing. 2. You may still see periods of bright red blood off and on. Try not to irritate the area and apply a fresh pad.

## 2016-10-02 NOTE — ED Provider Notes (Signed)
[facility] DEPT [facility] Provider Note   CSN: [id number] Arrival date & time: 10/02/16  0907     History   Chief Complaint Chief Complaint  Patient presents with  . Post-op Problem    HPI Jasmin [name] is a 71 y.o. female.  HPI Patient is status post hysterectomy, cystocele and rectocele repair from 7/25 . She was doing very well postoperatively. Patient reports she's been having minimal pain. She has not had fevers, chills, nausea or vomiting. She has been compliant with her postoperative instructions. She reports this morning when she got to the bathroom she saw blood in the toilet. She wiped with her tissue and there was blood. The blood was bright red. She has been wearing a pad and there continues to be some blood-tinged drainage. She is currently not having any pain. Past Medical History:  Diagnosis Date  . Arthritis   . Diabetes mellitus without complication ([place])   . Heart murmur   . Hyperlipidemia   . Hypertension   . Right kidney stone   . Sleep apnea   . Stroke ([place])   . UTI (urinary tract infection)     Patient Active Problem List   Diagnosis Date Noted  . Mixed hyperlipidemia 08/08/2016  . Controlled type 2 diabetes mellitus without complication, without long-term current use of insulin ([place]) 08/08/2016  . Essential hypertension 08/08/2016  . History of CVA (cerebrovascular accident) 08/08/2016    Past Surgical History:  Procedure Laterality Date  . CATARACT EXTRACTION Left   . COLPORRHAPHY N/A 09/14/2016   [facility]  . CYSTOCELE REPAIR    . FRACTURE SURGERY Left 1953   L arm  . FRACTURE SURGERY Left 1985   L ankle  . HERNIA REPAIR  2004  . JOINT REPLACEMENT Right 2012   R TKA  . JOINT REPLACEMENT Left 2017  . KIDNEY STONE SURGERY    . LAPAROSCOPIC ASSISTED VAGINAL HYSTERECTOMY N/A 09/14/2016   [facility]  . R foot/ankle Right 2013 and 2014   plate and screws lateral foot and tendon removal medial foot in 2013, revision in 2014  . RECTOCELE REPAIR    .  URETERAL STENT PLACEMENT    . URETEROSCOPY      OB History    No data available       Home Medications    Prior to Admission medications   Medication Sig Start Date End Date Taking? Authorizing Provider  acetaminophen (TYLENOL) 500 MG tablet Take by mouth.    [provider]  atorvastatin (LIPITOR) 20 MG tablet Take 1 tablet (20 mg total) by mouth daily. 08/08/16   Copland, Gay Filler, MD  Calcium Carbonate-Vitamin D3 (CALCIUM 600-D) 600-400 MG-UNIT TABS Take by mouth.    [provider]  Cholecalciferol (VITAMIN D3) 2000 units TABS Take by mouth.    [provider]  clopidogrel (PLAVIX) 75 MG tablet Take 1 tablet (75 mg total) by mouth daily. 08/08/16   Copland, Gay Filler, MD  Coconut Oil 1000 MG CAPS Take by mouth.    [provider]  desonide (DESOWEN) 0.05 % cream Apply topically 2 (two) times daily.    [provider]  econazole nitrate 1 % cream Apply topically daily.    [provider]  ezetimibe (ZETIA) 10 MG tablet Take 1 tablet (10 mg total) by mouth daily. 08/08/16   Copland, Gay Filler, MD  fluticasone (VERAMYST) 27.5 MCG/SPRAY nasal spray Place 1 spray into the nose daily.    [provider]  lisinopril (PRINIVIL,ZESTRIL) 10  MG tablet Take 1 tablet (10 mg total) by mouth daily. 08/08/16   Copland, Gay Filler, MD  Melatonin 5 MG CAPS Take 10 mg by mouth.    [provider]  metFORMIN (GLUCOPHAGE) 500 MG tablet Take 1 tablet (500 mg total) by mouth 2 (two) times daily with a meal. 08/08/16   Copland, Gay Filler, MD  metroNIDAZOLE (METROCREAM) 0.75 % cream Apply topically 2 (two) times daily.    [provider]  mirabegron ER (MYRBETRIQ) 50 MG TB24 tablet Take by mouth.    [provider]  Naproxen Sodium (ALEVE PO) Take by mouth as needed.    [provider]  omega-3 acid ethyl esters (LOVAZA) 1 g capsule Take 1 capsule (1 g total) by mouth 2 (two) times daily. 08/08/16   Copland, Gay Filler, MD  oxyCODONE-acetaminophen (ROXICET) 5-325 MG/5ML solution Take by mouth every 4 (four) hours as needed for severe pain.    [provider]  prednisoLONE acetate (PRED FORTE) 1 % ophthalmic suspension Instill 1 drop in the left eye 4 times a day. Start the day AFTER surgery. 07/08/16   [provider]  PSYLLIUM PO Take by mouth.    [provider]  solifenacin (VESICARE) 5 MG tablet Take by mouth. 04/21/16   [provider]  Turmeric Curcumin 500 MG CAPS Take by mouth.    [provider]    Family History Family History  Problem Relation Age of Onset  . Hyperlipidemia Mother   . Heart disease Mother   . Hypertension Mother   . Hypertension Father     Social History Social History  Substance Use Topics  . Smoking status: Never Smoker  . Smokeless tobacco: Not on file  . Alcohol use 0.6 oz/week    1 Glasses of wine per week     Allergies   Ciprofloxacin; Metronidazole; and Adhesive [tape]   Review of Systems Review of Systems 10 Systems reviewed and are negative for acute change except as noted in the HPI.  Physical Exam Updated Vital Signs BP (!) 151/85 (BP Location: Left Arm)   Pulse 96   Temp 99 F (37.2 C) (Oral)   Resp 20   Ht 5\' 1"  (1.549 m)   Wt 112 kg (247 lb)   SpO2 98%   BMI 46.67 kg/m   Physical Exam  Constitutional: She is oriented to person, place, and time.  Patient is alert and clinically well and appearance. Nontoxic.  HENT:  Head: Normocephalic and atraumatic.  Eyes: EOM are normal.  Cardiovascular: Normal rate, regular rhythm, normal heart sounds and intact distal pulses.   Pulmonary/Chest: Effort normal and breath sounds normal.  Abdominal: Soft. Bowel sounds are normal. She exhibits no distension. There is no tenderness. There is no guarding.  Genitourinary:  Genitourinary Comments: Vaginal inspection shows absorbable sutures at the 12:00 position in the posterior fourchette. The deepest suture  has slow, intermittent bright red bleeding. There is no active bleeding coming from the vaginal introitus. I have only manually spread the vaginal tissues and can visualize exudative, whitish plaques in the vaginal vault consistent with healing surgical sites. With this examination there is some slightly thin, grayish drainage from the vault but no blood or clots coming forth. The only visible bleeding, is coming from absorbable sutures at the fourchette. This is slow and stops with dabbing and light compression.  Musculoskeletal: Normal range of motion.  Neurological: She is alert and oriented to person, place, and time. No cranial nerve  deficit. She exhibits normal muscle tone. Coordination normal.  Skin: Skin is warm and dry.  Psychiatric: She has a normal mood and affect.     ED Treatments / Results  Labs (all labs ordered are listed, but only abnormal results are displayed) Labs Reviewed  URINALYSIS, ROUTINE W REFLEX MICROSCOPIC - Abnormal; Notable for the following:       Result Value   APPearance CLOUDY (*)    Hgb urine dipstick LARGE (*)    Leukocytes, UA LARGE (*)    All other components within normal limits  URINALYSIS, MICROSCOPIC (REFLEX) - Abnormal; Notable for the following:    Bacteria, UA MANY (*)    Squamous Epithelial / LPF 0-5 (*)    All other components within normal limits    EKG  EKG Interpretation None       Radiology No results found.  Procedures Procedures (including critical care time)  Medications Ordered in ED Medications - No data to display   Initial Impression / Assessment and Plan / ED Course  I have reviewed the triage vital signs and the nursing notes.  Pertinent labs & imaging results that were available during my care of the patient were reviewed by me and considered in my medical decision making (see chart for details).     Final Clinical Impressions(s) / ED Diagnoses   Final diagnoses:  Wound dehiscence  Vaginal bleeding    Patient presents for concerns of postoperative bleeding. She is doing very well in terms of pain and recovery of function. Bleeding was bright red today. On examination blood is coming from an absorbable suture in the vaginal fourchette. It is slow and stops easily. He is also on Plavix which may be increasing bleeding events. I advised the patient follow-up with her GYN as soon as possible to examine the wound and see if these absorbable levels might be appropriately removed to decrease the local irritation and bleeding from suture area.  New Prescriptions New Prescriptions   No medications on file     Charlesetta Shanks, MD 10/02/16 1042

## 2016-10-06 ENCOUNTER — Other Ambulatory Visit: Payer: Self-pay | Admitting: Family Medicine

## 2016-10-06 ENCOUNTER — Other Ambulatory Visit: Payer: Self-pay

## 2016-10-06 DIAGNOSIS — Z1239 Encounter for other screening for malignant neoplasm of breast: Secondary | ICD-10-CM

## 2016-10-06 NOTE — Telephone Encounter (Signed)
Self   lisinopril - 90 day  And also ezetimibe - 90 day    Pharmacy : [facility], [place] - [address]     Pt also stated that its time to have her yearly mammogram. Pt would like to have it set up with imaging downstairs. Please let pt know when orders has been placed.

## 2016-10-12 NOTE — Telephone Encounter (Signed)
Spoke with pharmacist, Leroy Sea and he states that pt had refills on file from June that they have recently filled for pt. States pt was calling in refill from old rx #'s. Mammogram order entered. Notified pt and transferred her to imaging to schedule appt.

## 2016-11-03 ENCOUNTER — Encounter (HOSPITAL_BASED_OUTPATIENT_CLINIC_OR_DEPARTMENT_OTHER): Payer: Self-pay

## 2016-11-03 ENCOUNTER — Ambulatory Visit (HOSPITAL_BASED_OUTPATIENT_CLINIC_OR_DEPARTMENT_OTHER)
Admission: RE | Admit: 2016-11-03 | Discharge: 2016-11-03 | Disposition: A | Discharge status: Home / Self Care | Payer: Medicare Other | Source: Ambulatory Visit | Attending: Family Medicine | Admitting: Family Medicine

## 2016-11-03 DIAGNOSIS — Z1231 Encounter for screening mammogram for malignant neoplasm of breast: Secondary | ICD-10-CM | POA: Diagnosis not present

## 2016-11-03 DIAGNOSIS — Z1239 Encounter for other screening for malignant neoplasm of breast: Secondary | ICD-10-CM

## 2016-11-23 ENCOUNTER — Ambulatory Visit (INDEPENDENT_AMBULATORY_CARE_PROVIDER_SITE_OTHER): Payer: Medicare Other

## 2016-11-23 DIAGNOSIS — Z23 Encounter for immunization: Secondary | ICD-10-CM

## 2016-12-06 ENCOUNTER — Telehealth: Payer: Self-pay | Admitting: Family Medicine

## 2016-12-06 NOTE — Telephone Encounter (Signed)
Jasmin [name] Self [phone]  omega-3 acid ethyl esters (LOVAZA) 1 g capsule   [name] called to say that she just realized that she was supposed to be taking this medication 1 cap twice a day, but she has been taking 2 cap twice a day and is about to run out. She has an appointment next week, if she goes to 1 cap twice a day she will have enough to last until next week, but if she continues the 2 caps twice a day she will need new prescription. Please call and advise her of what to do.

## 2016-12-07 DIAGNOSIS — Z96653 Presence of artificial knee joint, bilateral: Secondary | ICD-10-CM

## 2016-12-07 HISTORY — DX: Presence of artificial knee joint, bilateral: Z96.653

## 2016-12-09 NOTE — Telephone Encounter (Signed)
Called and LMOM- may go to just 1 cap BID and we will plan to check her numbers next week

## 2016-12-11 NOTE — Progress Notes (Addendum)
[facility] [address], [place], [place] [address] [phone]  Date:  12/14/2016   Name:  Jasmin [name]   DOB:  1945/03/29   MRN:  [id number]  PCP:  Darreld Mclean, MD    Chief Complaint: Follow-up (Pt will need A1C referral for new cardiologist. Pt had flu vaccine 11/23/16. )   History of Present Illness:  Jasmin [name] is a 71 y.o. very pleasant female patient who presents with the following:  History of DM, HTN, hyperlipidemia and CVA Here today for a follow-up visit  From my last visit in June: She did have a stroke in July 2017- she is now on Plavix She has recovered more or less fully from her CVA- she was in rehab for 12 weeks. 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 She mostly uses aleve  She also uses tumeric and coconut oil for her joints  She is on 10 mg of lisinopril as well for her HTN  Pt had a hysterectomy and bladder sling done in July She notes that her urinary sx are not really better, but she has a follow-up visit next month. They changed her bladder control medication to nighttime.  Her urologist is in [place], Dr. Burnell Blanks. He and Dr, Posey Pronto, his GYN, did her series of operations together   Lab Results  Component Value Date   HGBA1C 7.1 (H) 08/08/2016   Due for foot exam- will do today Eye exam: this was done last in May of this year, she has a follow-up in December Labs: done in June.  Just needs and A1c today Also hep C screening is due  Flu shot done this month  Also in June she had a kidney stone removed, and had  a left cataract removed in May of this year  She has had bilateral knee replacements- having more knee pain again, but is seeing her ortho.  She is going to start knee rehab again, and they are not planning any upcoming operations, her films looked good  She has been checking her glucose- fasting 100-120 She needs her post 71 yo pneumovax   She has been 2g of omega 3 BID- she changed to 1 BID a couple of weeks ago as she was taking the 2 BID by mistake.  We will check her lipids today and decide if we should continue 2 gm BID  She had been seeing a cardiologist at [facility] but her doctor left the practice.  She got a new doctor but he left as well.  BP Readings from Last 3 Encounters:  12/14/16 124/78  10/02/16 (!) 151/85  08/08/16 (!) 142/64   Epic says she is due for her post-71 yo pneumovax.  However she had her pneumovax when she was less than a month shy of 65, so we are satisfied with this dose prevnar is done  Patient Active Problem List   Diagnosis Date Noted  . Mixed hyperlipidemia 08/08/2016  . Controlled type 2 diabetes mellitus without complication, without long-term current  use of insulin ([place]) 08/08/2016  . Essential hypertension 08/08/2016  . History of CVA (cerebrovascular accident) 08/08/2016    Past Medical History:  Diagnosis Date  . Arthritis   . Diabetes mellitus without complication ([place])   . Heart murmur   . Hyperlipidemia   . Hypertension   . Right kidney stone   . Sleep apnea   . Stroke ([place])   . UTI (urinary tract infection)     Past Surgical History:  Procedure Laterality Date  . CATARACT EXTRACTION Left   . COLPORRHAPHY N/A 09/14/2016   [facility]  . CYSTOCELE REPAIR    . FRACTURE SURGERY Left 1953   L arm  . FRACTURE SURGERY Left 1985   L ankle  . HERNIA REPAIR  2004  . JOINT REPLACEMENT Right 2012   R TKA  . JOINT REPLACEMENT Left 2017  . KIDNEY STONE SURGERY    . LAPAROSCOPIC ASSISTED VAGINAL HYSTERECTOMY N/A 09/14/2016   [facility]  . R  foot/ankle Right 2013 and 2014   plate and screws lateral foot and tendon removal medial foot in 2013, revision in 2014  . RECTOCELE REPAIR    . URETERAL STENT PLACEMENT    . URETEROSCOPY      Social History  Substance Use Topics  . Smoking status: Never Smoker  . Smokeless tobacco: Not on file  . Alcohol use 0.6 oz/week    1 Glasses of wine per week    Family History  Problem Relation Age of Onset  . Hyperlipidemia Mother   . Heart disease Mother   . Hypertension Mother   . Hypertension Father     Allergies  Allergen Reactions  . Ciprofloxacin Hives  . Metronidazole Hives  . Adhesive [Tape] Rash    Medication list has been reviewed and updated.  Current Outpatient Prescriptions on File Prior to Visit  Medication Sig Dispense Refill  . acetaminophen (TYLENOL) 500 MG tablet Take by mouth as needed.     [name] atorvastatin (LIPITOR) 20 MG tablet Take 1 tablet (20 mg total) by mouth daily. 90 tablet 3  . Calcium Carbonate-Vitamin D3 (CALCIUM 600-D) 600-400 MG-UNIT TABS Take by mouth.    . Cholecalciferol (VITAMIN D3) 2000 units TABS Take by mouth. Take one tablet in the AM and one in the PM.    . clopidogrel (PLAVIX) 75 MG tablet Take 1 tablet (75 mg total) by mouth daily. 90 tablet 3  . desonide (DESOWEN) 0.05 % cream Apply topically 2 (two) times daily.    [name] econazole nitrate 1 % cream Apply topically daily.    [name] ezetimibe (ZETIA) 10 MG tablet Take 1 tablet (10 mg total) by mouth daily. 90 tablet 3  . fluticasone (VERAMYST) 27.5 MCG/SPRAY nasal spray Place 1 spray into the nose as needed.     [name] lisinopril (PRINIVIL,ZESTRIL) 10 MG tablet Take 1 tablet (10 mg total) by mouth daily. 90 tablet 3  . Melatonin 5 MG CAPS Take 10 mg by mouth.    . metFORMIN (GLUCOPHAGE) 500 MG tablet Take 1 tablet (500 mg total) by mouth 2 (two) times daily with a meal. 180 tablet 3  . metroNIDAZOLE (METROCREAM) 0.75 % cream Apply topically 2 (two) times daily.    . mirabegron ER (MYRBETRIQ) 50 MG  TB24 tablet Take by mouth.    . omega-3 acid ethyl esters (LOVAZA) 1 g capsule Take 1 capsule (1 g total) by mouth 2 (two) times daily. 180 capsule 3  . oxyCODONE-acetaminophen (ROXICET) 5-325 MG/5ML solution Take by  mouth every 4 (four) hours as needed for severe pain.    [name] PSYLLIUM PO Take by mouth.    . Turmeric Curcumin 500 MG CAPS Take by mouth.     No current facility-administered medications on file prior to visit.     Review of Systems:  As per HPI- otherwise negative.   Physical Examination: Vitals:   12/14/16 0826  BP: 124/78  Pulse: 91  Temp: (!) 97.5 F (36.4 C)  SpO2: 98%   Vitals:   12/14/16 0826  Weight: 243 lb 12.8 oz (110.6 kg)  Height: 5\' 1"  (1.549 m)   Body mass index is 46.07 kg/m. Ideal Body Weight: Weight in (lb) to have BMI = 25: 132  GEN: WDWN, NAD, Non-toxic, A & O x 3 HEENT: Atraumatic, Normocephalic. Neck supple. No masses, No LAD. Ears and Nose: No external deformity. CV: RRR, No M/G/R. No JVD. No thrill. No extra heart sounds. PULM: CTA B, no wheezes, crackles, rhonchi. No retractions. No resp. distress. No accessory muscle use. ABD: S, NT, ND, +BS. No rebound. No HSM. EXTR: No c/c/e NEURO Normal gait.  PSYCH: Normally interactive. Conversant. Not depressed or anxious appearing.  Calm demeanor.    Assessment and Plan: Controlled type 2 diabetes mellitus without complication, without long-term current use of insulin ([place]) - Plan: Hemoglobin A1c  Mixed hyperlipidemia - Plan: Lipid panel  Essential hypertension - Plan: Ambulatory referral to Cardiology  History of CVA (cerebrovascular accident)  Encounter for hepatitis C screening test for low risk patient - Plan: Hepatitis C antibody  Follow-up visit today Several recent operations, good outcomes overall Follow-up on DM today with A1c She is currently on metformin 500 BID, has been well controlled History of CVA- continue plavix and BP control Referral to cardiology to establish  care   Signed Lamar Blinks, MD  Received labs so far- just waiting on hep C  Results for orders placed or performed in visit on 12/14/16  Hemoglobin A1c  Result Value Ref Range   Hgb A1c MFr Bld 6.7 (H) 4.6 - 6.5 %  Lipid panel  Result Value Ref Range   Cholesterol 140 0 - 200 mg/dL   Triglycerides 257.0 (H) 0.0 - 149.0 mg/dL   HDL 46.40 >39.00 mg/dL   VLDL 51.4 (H) 0.0 - 40.0 mg/dL   Total CHOL/HDL Ratio 3    NonHDL 93.27   LDL cholesterol, direct  Result Value Ref Range   Direct LDL 66.0 mg/dL   Message to pt on mychart  Will have her continue on lovaza 2gm BID

## 2016-12-14 ENCOUNTER — Ambulatory Visit (INDEPENDENT_AMBULATORY_CARE_PROVIDER_SITE_OTHER): Payer: Medicare Other | Admitting: Family Medicine

## 2016-12-14 ENCOUNTER — Encounter: Payer: Self-pay | Admitting: Family Medicine

## 2016-12-14 ENCOUNTER — Telehealth: Payer: Self-pay | Admitting: Family Medicine

## 2016-12-14 VITALS — BP 124/78 | HR 91 | Temp 97.5°F | Ht 61.0 in | Wt 243.8 lb

## 2016-12-14 DIAGNOSIS — E782 Mixed hyperlipidemia: Secondary | ICD-10-CM | POA: Diagnosis not present

## 2016-12-14 DIAGNOSIS — E119 Type 2 diabetes mellitus without complications: Secondary | ICD-10-CM

## 2016-12-14 DIAGNOSIS — Z1159 Encounter for screening for other viral diseases: Secondary | ICD-10-CM | POA: Diagnosis not present

## 2016-12-14 DIAGNOSIS — Z8673 Personal history of transient ischemic attack (TIA), and cerebral infarction without residual deficits: Secondary | ICD-10-CM

## 2016-12-14 DIAGNOSIS — I10 Essential (primary) hypertension: Secondary | ICD-10-CM | POA: Diagnosis not present

## 2016-12-14 LAB — LIPID PANEL
Cholesterol: 140 mg/dL (ref 0–200)
HDL: 46.4 mg/dL (ref >39.00)
NonHDL: 93.27
Total CHOL/HDL Ratio: 3
Triglycerides: 257 mg/dL — ABNORMAL HIGH (ref 0.0–149.0)
VLDL: 51.4 mg/dL — AB (ref 0.0–40.0)

## 2016-12-14 LAB — HEMOGLOBIN A1C: Hgb A1c MFr Bld: 6.7 % — ABNORMAL HIGH (ref 4.6–6.5)

## 2016-12-14 LAB — LDL CHOLESTEROL, DIRECT: LDL DIRECT: 66 mg/dL

## 2016-12-14 MED ORDER — OMEGA-3-ACID ETHYL ESTERS 1 G PO CAPS: 2.0000 g | ORAL_CAPSULE | Freq: Two times a day (BID) | ORAL | 3 refills | Status: DC

## 2016-12-14 NOTE — Telephone Encounter (Addendum)
Relation to BV:APOL Call back number: Pharmacy: [facility], [place] - [address] [phone] (Phone) [phone] (Fax)     Reason for call:  Patient requesting shingle vaccination, please fax orders to pharmacy

## 2016-12-14 NOTE — Patient Instructions (Addendum)
It was a pleasure to see you today, as always!  Take care and I will be in touch with your labs.  Depending on your cholesterol numbers we may have you continue the 2g of omega 3 twice daily. Will also do your A1c and hep C screening today, and I am going to refer you to establish care with cardiology here at the [facility]  We will plan our next visit depending on your labs, but likely 4-6 months will be ok   Please ask [facility] about the Shingrix vaccine series

## 2016-12-14 NOTE — Addendum Note (Signed)
Addended by: Lamar Blinks C on: 12/14/2016 07:42 PM   Modules accepted: Orders

## 2016-12-15 ENCOUNTER — Encounter: Payer: Self-pay | Admitting: Family Medicine

## 2016-12-15 LAB — HEPATITIS C ANTIBODY
Hepatitis C Ab: NONREACTIVE
SIGNAL TO CUT-OFF: 0.01 (ref <1.00)

## 2016-12-16 DIAGNOSIS — G4730 Sleep apnea, unspecified: Secondary | ICD-10-CM | POA: Insufficient documentation

## 2016-12-16 DIAGNOSIS — M1990 Unspecified osteoarthritis, unspecified site: Secondary | ICD-10-CM | POA: Insufficient documentation

## 2016-12-16 DIAGNOSIS — N200 Calculus of kidney: Secondary | ICD-10-CM | POA: Insufficient documentation

## 2016-12-16 DIAGNOSIS — I639 Cerebral infarction, unspecified: Secondary | ICD-10-CM | POA: Insufficient documentation

## 2016-12-16 DIAGNOSIS — I10 Essential (primary) hypertension: Secondary | ICD-10-CM | POA: Insufficient documentation

## 2016-12-16 DIAGNOSIS — E119 Type 2 diabetes mellitus without complications: Secondary | ICD-10-CM | POA: Insufficient documentation

## 2016-12-16 DIAGNOSIS — E785 Hyperlipidemia, unspecified: Secondary | ICD-10-CM | POA: Insufficient documentation

## 2016-12-16 DIAGNOSIS — N390 Urinary tract infection, site not specified: Secondary | ICD-10-CM | POA: Insufficient documentation

## 2016-12-16 DIAGNOSIS — R011 Cardiac murmur, unspecified: Secondary | ICD-10-CM | POA: Insufficient documentation

## 2017-01-02 NOTE — Progress Notes (Signed)
Cardiology Office Note:    Date:  01/03/2017   ID:  Jasmin [name], DOB April 14, 1945, MRN [id number]  PCP:  Darreld Mclean, MD  Cardiologist:  Shirlee More, MD   Referring MD: Darreld Mclean, MD  ASSESSMENT:    1. Aortic valve stenosis, etiology of cardiac valve disease unspecified   2. Essential hypertension   3. Mixed hyperlipidemia    PLAN:    In order of problems listed above:  1. 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  She had an echocardiogram performed this year in my previous practice by another provider and we have requested a copy.  She was told after last echocardiogram she was stable and would be seen in 1 year in follow-up.  She was a never advised interventions for valvular heart disease but does follow endocarditis prophylaxis with bilateral total knee arthroplasty.  She is amoxicillin has an up-to-date prescription.  She is not having angina shortness of breath or syncope but presently is in physical therapy with knee pain and limitations.  She has had multiple surgical procedures performed in the last 6 months.  There is no concern showed by anesthesiology for significant valvular heart disease.  I will see her back in the office in 6 months and plan recheck echocardiogram next August unless she had more advanced valvular heart disease when I receive her records. 2. Stable blood pressure target continue current treatment including ACE inhibitor 3. Stable continue high intensity statin with stroke.  She also continue her current clopidogrel for stroke prophylaxis.  Next appointment   Medication Adjustments/Labs and Tests Ordered: Current medicines are reviewed at length with the patient today.  Concerns regarding medicines are outlined above.  No orders of the defined types were placed in this encounter.  No orders of the defined types were placed in this encounter.     Chief Complaint  Patient presents with  . Establish Care    Yearly visit for Lt ventricular hypertrophy and heart murmer  6 months follow-up  History of Present Illness:    Jasmin [name] is a 71 y.o. female withDM, HTN, hyperlipidemia, CVA and valvular heart disease  And echo at [facility] in August 2017 results unavailable who is being seen today for the evaluation of high blood pressure at the request of Copland, Gay Filler, MD. She previously was seen by Dr. Elonda Husky of [facility] 2 years ago and one year ago seen by Dr. Lars Masson.  She knows she has valvular aortic stenosis does not think it severe and the onset was 5-6 years ago and she has a family history with her mother having valvular heart disease and multiple open heart surgeries beginning in her mid 77s.  She follows endocarditis prophylaxis and has had no chest pain shortness of breath or syncope.  She has had no fever or chills.  Past Medical History:  Diagnosis Date  . Acid reflux disease 03/09/2015  . Aortic stenosis 03/09/2015  . AR (allergic rhinitis) 03/09/2015  . Arthritis   . Controlled type 2 diabetes mellitus without complication, without long-term current use of insulin ([place]) 08/08/2016  . 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 Diabetes mellitus without complication ([place])   . Essential hypertension 08/08/2016  . Heart murmur   . 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 History of CVA (cerebrovascular accident) 08/08/2016   Seen on MRI from 08/2015  . Hyperlipidemia   .  Hypertension   . 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 IBS (irritable bowel syndrome) 07/16/2015  . Insomnia 07/16/2015  . Left ventricular hypertrophy 07/16/2015  . Mixed hyperlipidemia 08/08/2016  . Morbid obesity ([place]) 11/10/2015  . Nuclear sclerotic cataract of left eye 07/07/2016  . OAB (overactive bladder) 01/20/2016  . OSA (obstructive sleep apnea) 03/09/2015  . Osteoporosis 03/09/2015  . 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  Pulmonary hypertension ([place]) 07/16/2015  . Right kidney stone   . Sleep apnea   . Stroke ([place])   . UTI (urinary tract infection)   . Vitamin D deficiency 03/09/2015    Past Surgical History:  Procedure Laterality Date  . ABDOMINAL HYSTERECTOMY  09/14/2016  . CATARACT EXTRACTION Left   . CATARACT EXTRACTION Left 06/2016  . COLPORRHAPHY N/A 09/14/2016   [facility]  . CYSTOCELE REPAIR    . FRACTURE SURGERY Left 1953   L arm  . FRACTURE SURGERY Left 1985   L ankle  . HERNIA REPAIR  2004  . JOINT REPLACEMENT Right 2012   R TKA  . JOINT REPLACEMENT Left 2017  . KIDNEY STONE SURGERY    . LAPAROSCOPIC ASSISTED VAGINAL HYSTERECTOMY N/A 09/14/2016   [facility]  . R foot/ankle Right 2013 and 2014   plate and screws lateral foot and tendon removal medial foot in 2013, revision in 2014  . RECTOCELE REPAIR    . URETERAL STENT PLACEMENT    . URETEROSCOPY      Current Medications: Current Meds  Medication Sig  . acetaminophen (TYLENOL) 500 MG tablet Take by mouth as needed.   [name] atorvastatin (LIPITOR) 20 MG tablet Take 1 tablet (20 mg total) by mouth daily.  . Calcium Carbonate-Vitamin D3 (CALCIUM 600-D) 600-400 MG-UNIT TABS Take by mouth.  . Cholecalciferol (VITAMIN D3) 2000 units TABS Take by mouth. Take one tablet in the AM and one in the PM.  . clopidogrel (PLAVIX) 75 MG tablet Take 1 tablet (75 mg total) by mouth daily.  [name] desonide (DESOWEN) 0.05 % cream Apply topically 2 (two) times daily.  [name] econazole nitrate 1 % cream Apply topically daily.  [name] ezetimibe (ZETIA) 10 MG tablet Take 1 tablet (10 mg total) by mouth daily.  . fluticasone (VERAMYST) 27.5 MCG/SPRAY nasal spray Place 1 spray into the nose as needed.   [name] lisinopril (PRINIVIL,ZESTRIL) 10 MG tablet Take 1 tablet (10 mg total) by mouth daily.  . Melatonin 5 MG CAPS Take 10 mg by mouth.  . metFORMIN (GLUCOPHAGE) 500 MG tablet Take 1 tablet (500 mg total) by mouth 2 (two) times daily with a meal.  . metroNIDAZOLE (METROCREAM) 0.75 % cream  Apply topically 2 (two) times daily.  . mirabegron ER (MYRBETRIQ) 50 MG TB24 tablet Take by mouth.  . omega-3 acid ethyl esters (LOVAZA) 1 g capsule Take 2 capsules (2 g total) by mouth 2 (two) times daily.  [name] oxyCODONE-acetaminophen (ROXICET) 5-325 MG/5ML solution Take by mouth every 4 (four) hours as needed for severe pain.  [name] PSYLLIUM PO Take by mouth.  . [DISCONTINUED] Turmeric Curcumin 500 MG CAPS Take by mouth.     Allergies:   Ciprofloxacin; Metronidazole; and Adhesive [tape]   Social History   Socioeconomic History  . Marital status: Married    Spouse name: None  . Number of children: None  . Years of education: None  . Highest education level: None  Social Needs  . Financial resource strain: None  . Food insecurity - worry: None  . Food insecurity -  inability: None  . Transportation needs - medical: None  . Transportation needs - non-medical: None  Occupational History  . None  Tobacco Use  . Smoking status: Never Smoker  . Smokeless tobacco: Never Used  Substance and Sexual Activity  . Alcohol use: Yes    Alcohol/week: 0.6 oz    Types: 1 Glasses of wine per week  . Drug use: No  . Sexual activity: No  Other Topics Concern  . None  Social History Narrative  . None     Family History: The patient's family history includes Heart disease in her mother; Hyperlipidemia in her mother; Hypertension in her father and mother.  ROS:   Review of Systems  Constitution: Negative.  HENT: Negative.   Eyes: Negative.   Cardiovascular: Negative.   Respiratory: Negative.   Endocrine: Negative.   Skin: Negative.   Musculoskeletal: Positive for arthritis, back pain and muscle weakness.  Gastrointestinal: Negative.   Genitourinary: Positive for bladder incontinence and frequency (presently treated with UTI).  Neurological: Negative.   Psychiatric/Behavioral: Negative.    Please see the history of present illness.      EKGs/Labs/Other Studies Reviewed:    The  following studies were reviewed today:   EKG:  EKG is  ordered today.  The ekg ordered today demonstrates sinus rhythm normal  Recent Labs: 08/08/2016: ALT 13; BUN 13; Creatinine, Ser 0.61; Hemoglobin 14.3; Platelets 333.0; Potassium 4.1; Sodium 136  Recent Lipid Panel    Component Value Date/Time   CHOL 140 12/14/2016 0856   TRIG 257.0 (H) 12/14/2016 0856   HDL 46.40 12/14/2016 0856   CHOLHDL 3 12/14/2016 0856   VLDL 51.4 (H) 12/14/2016 0856   LDLCALC 70 01/18/2016   LDLDIRECT 66.0 12/14/2016 0856    Physical Exam:    VS:  BP 114/76 (BP Location: Left Arm, Patient Position: Sitting, Cuff Size: Large)   Pulse 86   Ht 5\' 1"  (1.549 m)   Wt 242 lb (109.8 kg)   SpO2 96%   BMI 45.73 kg/m     Wt Readings from Last 3 Encounters:  01/03/17 242 lb (109.8 kg)  12/14/16 243 lb 12.8 oz (110.6 kg)  10/02/16 247 lb (112 kg)     GEN: Marked central obesity well developed in no acute distress HEENT: Normal NECK: No JVD; No carotid bruits LYMPHATICS: No lymphadenopathy CARDIAC: 2/6 harsh SEM mid peak S2 splits ejection click radiated to right clavicle RRR, rubs, gallops there is no murmur of aortic regurgitation or palpable thrill RESPIRATORY:  Clear to auscultation without rales, wheezing or rhonchi  ABDOMEN: Soft, non-tender, non-distended MUSCULOSKELETAL:  No edema; No deformity  SKIN: Warm and dry NEUROLOGIC:  Alert and oriented x 3 PSYCHIATRIC:  Normal affect     Signed, Shirlee More, MD  01/03/2017 12:55 PM    [facility]

## 2017-01-03 ENCOUNTER — Ambulatory Visit: Payer: Medicare Other | Admitting: Cardiology

## 2017-01-03 ENCOUNTER — Encounter: Payer: Self-pay | Admitting: Cardiology

## 2017-01-03 VITALS — BP 114/76 | HR 86 | Ht 61.0 in | Wt 242.0 lb

## 2017-01-03 DIAGNOSIS — I10 Essential (primary) hypertension: Secondary | ICD-10-CM

## 2017-01-03 DIAGNOSIS — E782 Mixed hyperlipidemia: Secondary | ICD-10-CM

## 2017-01-03 DIAGNOSIS — I350 Nonrheumatic aortic (valve) stenosis: Secondary | ICD-10-CM | POA: Diagnosis not present

## 2017-01-03 NOTE — Patient Instructions (Signed)

## 2017-01-03 NOTE — Addendum Note (Signed)
Addended by: Warner Mccreedy E on: 01/03/2017 04:52 PM   Modules accepted: Orders

## 2017-03-09 ENCOUNTER — Ambulatory Visit: Payer: Medicare Other | Admitting: Medical

## 2017-03-09 ENCOUNTER — Encounter: Payer: Self-pay | Admitting: Medical

## 2017-03-09 VITALS — BP 134/58 | HR 95 | Temp 98.0°F | Resp 18 | Ht 61.0 in | Wt 250.0 lb

## 2017-03-09 DIAGNOSIS — J0100 Acute maxillary sinusitis, unspecified: Secondary | ICD-10-CM | POA: Diagnosis not present

## 2017-03-09 MED ORDER — FLUTICASONE FUROATE 27.5 MCG/SPRAY NA SUSP: NASAL | 32 refills | Status: DC

## 2017-03-09 MED ORDER — DOXYCYCLINE HYCLATE 100 MG PO TABS: 100.0000 mg | ORAL_TABLET | Freq: Two times a day (BID) | ORAL | 0 refills | Status: DC

## 2017-03-09 MED ORDER — ALBUTEROL SULFATE HFA 108 (90 BASE) MCG/ACT IN AERS: 2.0000 | INHALATION_SPRAY | Freq: Four times a day (QID) | RESPIRATORY_TRACT | 2 refills | Status: DC | PRN

## 2017-03-09 MED ORDER — HYDROCODONE-HOMATROPINE 5-1.5 MG/5ML PO SYRP: 5.0000 mL | ORAL_SOLUTION | Freq: Three times a day (TID) | ORAL | 0 refills | Status: DC | PRN

## 2017-03-09 NOTE — Patient Instructions (Addendum)
You appear to have a sinus infection. I am prescribing doxycycline antibiotic for the infection. To help with the nasal congestion I prescribed  veramyst nasal steroid. For your associated cough, I prescribed cough medicine hycodan  Will make albuterol available if you develop wheezing.  Rest, hydrate, tylenol for fever.  Follow up in 7 days or as needed.

## 2017-03-09 NOTE — Progress Notes (Signed)
Subjective:    Patient ID: Jasmin [name], female    DOB: 1945-09-30, 72 y.o.   MRN: [id number]  HPI   Pt in for evaluation.  Pt has some recent sinus pressure and congestion. Some mild HA over frontal and maxillary sinus area. Pt states using cpap felt congested and could not breath well. She is coughing up some clear  Mucous. Pt states breathing difficulty from severe nasal congestion.  Not her lower airways  However she did mention maybe some wheezing. Rare use of inhaler on one time in her life. But that was about one year ago with URI type illness.    Review of Systems  Constitutional: Negative for chills, fatigue and fever.  HENT: Positive for congestion, postnasal drip, sinus pressure, sinus pain and sneezing. Negative for ear pain and sore throat.   Respiratory: Positive for cough and wheezing. Negative for chest tightness and shortness of breath.        Maybe wheeze. Pt not sure if was wheezing.  Cardiovascular: Negative for chest pain and palpitations.  Gastrointestinal: Negative for abdominal pain.  Musculoskeletal: Negative for back pain and joint swelling.  Neurological: Negative for dizziness, weakness, numbness and headaches.  Hematological: Negative for adenopathy. Does not bruise/bleed easily.  Psychiatric/Behavioral: Negative for behavioral problems and confusion.    Past Medical History:  Diagnosis Date  . Acid reflux disease 03/09/2015  . Aortic stenosis 03/09/2015  . AR (allergic rhinitis) 03/09/2015  . Arthritis   . Controlled type 2 diabetes mellitus without complication, without long-term current use of insulin ([place]) 08/08/2016  . 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 Diabetes mellitus without complication ([place])   . Essential hypertension 08/08/2016  . Heart murmur   . 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 History of CVA (cerebrovascular accident) 08/08/2016   Seen on MRI from 08/2015  . Hyperlipidemia   . Hypertension   . Hypertonicity of  bladder 03/09/2015  . IBS (irritable bowel syndrome) 07/16/2015  . Insomnia 07/16/2015  . Left ventricular hypertrophy 07/16/2015  . Mixed hyperlipidemia 08/08/2016  . Morbid obesity ([place]) 11/10/2015  . Nuclear sclerotic cataract of left eye 07/07/2016  . OAB (overactive bladder) 01/20/2016  . OSA (obstructive sleep apnea) 03/09/2015  . Osteoporosis 03/09/2015  . 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 Pulmonary hypertension ([place]) 07/16/2015  . Right kidney stone   . Sleep apnea   . Stroke ([place])   . UTI (urinary tract infection)   . Vitamin D deficiency 03/09/2015     Social History   Socioeconomic History  . Marital status: Married    Spouse name: Not on file  . Number of children: Not on file  . Years of education: Not on file  . Highest education level: Not on file  Social Needs  . Financial resource strain: Not on file  . Food insecurity - worry: Not on file  . Food insecurity - inability: Not on file  . Transportation needs - medical: Not on file  . Transportation needs - non-medical: Not on file  Occupational History  . Not on file  Tobacco Use  . Smoking status: Never Smoker  . Smokeless tobacco: Never Used  Substance and Sexual Activity  . Alcohol use: Yes    Alcohol/week: 0.6 oz    Types: 1 Glasses of wine per week  . Drug use: No  . Sexual activity: No  Other Topics Concern  . Not on file  Social History Narrative  . Not on file  Past Surgical History:  Procedure Laterality Date  . ABDOMINAL HYSTERECTOMY  09/14/2016  . CATARACT EXTRACTION Left   . CATARACT EXTRACTION Left 06/2016  . COLPORRHAPHY N/A 09/14/2016   [facility]  . CYSTOCELE REPAIR    . FRACTURE SURGERY Left 1953   L arm  . FRACTURE SURGERY Left 1985   L ankle  . HERNIA REPAIR  2004  . JOINT REPLACEMENT Right 2012   R TKA  . JOINT REPLACEMENT Left 2017  . KIDNEY STONE SURGERY    . LAPAROSCOPIC ASSISTED VAGINAL HYSTERECTOMY N/A 09/14/2016   [facility]  . R foot/ankle Right 2013 and 2014    plate and screws lateral foot and tendon removal medial foot in 2013, revision in 2014  . RECTOCELE REPAIR    . URETERAL STENT PLACEMENT    . URETEROSCOPY      Family History  Problem Relation Age of Onset  . Hyperlipidemia Mother   . Heart disease Mother   . Hypertension Mother   . Hypertension Father     Allergies  Allergen Reactions  . Ciprofloxacin Hives  . Metronidazole Hives  . Adhesive [Tape] Rash    Current Outpatient Medications on File Prior to Visit  Medication Sig Dispense Refill  . acetaminophen (TYLENOL) 500 MG tablet Take by mouth as needed.     [name] atorvastatin (LIPITOR) 20 MG tablet Take 1 tablet (20 mg total) by mouth daily. 90 tablet 3  . Calcium Carbonate-Vitamin D3 (CALCIUM 600-D) 600-400 MG-UNIT TABS Take by mouth.    . Cholecalciferol (VITAMIN D3) 2000 units TABS Take by mouth. Take one tablet in the AM and one in the PM.    . clopidogrel (PLAVIX) 75 MG tablet Take 1 tablet (75 mg total) by mouth daily. 90 tablet 3  . desonide (DESOWEN) 0.05 % cream Apply topically 2 (two) times daily.    [name] econazole nitrate 1 % cream Apply topically daily.    [name] ezetimibe (ZETIA) 10 MG tablet Take 1 tablet (10 mg total) by mouth daily. 90 tablet 3  . fluticasone (VERAMYST) 27.5 MCG/SPRAY nasal spray Place 1 spray into the nose as needed.     [name] lisinopril (PRINIVIL,ZESTRIL) 10 MG tablet Take 1 tablet (10 mg total) by mouth daily. 90 tablet 3  . Melatonin 5 MG CAPS Take 10 mg by mouth.    . metFORMIN (GLUCOPHAGE) 500 MG tablet Take 1 tablet (500 mg total) by mouth 2 (two) times daily with a meal. 180 tablet 3  . metroNIDAZOLE (METROCREAM) 0.75 % cream Apply topically 2 (two) times daily.    . mirabegron ER (MYRBETRIQ) 50 MG TB24 tablet Take by mouth.    . omega-3 acid ethyl esters (LOVAZA) 1 g capsule Take 2 capsules (2 g total) by mouth 2 (two) times daily. 360 capsule 3  . oxyCODONE-acetaminophen (ROXICET) 5-325 MG/5ML solution Take by mouth every 4 (four) hours as needed  for severe pain.    [name] PSYLLIUM PO Take by mouth.     No current facility-administered medications on file prior to visit.     BP (!) 134/58 (BP Location: Left Arm, Patient Position: Sitting, Cuff Size: Large)   Pulse 95   Temp 98 F (36.7 C) (Oral)   Resp 18   Ht 5\' 1"  (1.549 m)   Wt 250 lb (113.4 kg)   SpO2 97%   BMI 47.24 kg/m       Objective:   Physical Exam   General  Mental Status - Alert. General Appearance - Well  groomed. Not in acute distress.  Skin Rashes- No Rashes.  HEENT Head- Normal. Ear Auditory Canal - Left- Normal. Right - Normal.Tympanic Membrane- Left- Normal. Right- Normal. Eye Sclera/Conjunctiva- Left- Normal. Right- Normal. Nose & Sinuses Nasal Mucosa- Left-  Boggy and Congested. Right-  Boggy and  Congested.Bilateral maxillary and frontal sinus pressure. Mouth & Throat Lips: Upper Lip- Normal: no dryness, cracking, pallor, cyanosis, or vesicular eruption. Lower Lip-Normal: no dryness, cracking, pallor, cyanosis or vesicular eruption. Buccal Mucosa- Bilateral- No Aphthous ulcers. Oropharynx- No Discharge or Erythema. Tonsils: Characteristics- Bilateral- No Erythema or Congestion. Size/Enlargement- Bilateral- No enlargement. Discharge- bilateral-None.  Neck Neck- Supple. No Masses.   Chest and Lung Exam Auscultation: Breath Sounds:-Clear even and unlabored.  Cardiovascular Auscultation:Rythm- Regular, rate and rhythm. Murmurs & Other Heart Sounds:Ausculatation of the heart reveal- No Murmurs.  Lymphatic Head & Neck General Head & Neck Lymphatics: Bilateral: Description- No Localized lymphadenopathy.      Assessment & Plan:  You appear to have a sinus infection. I am prescribing doxycycline antibiotic for the infection. To help with the nasal congestion I prescribed  veramyst nasal steroid. For your associated cough, I prescribed cough medicine hycodan  Will make albuterol available if you develop wheezing.  Rest, hydrate, tylenol  for fever.  Follow up in 7 days or as needed.  Adonias Demore, Percell Miller, PA-C

## 2017-03-17 ENCOUNTER — Telehealth: Payer: Self-pay | Admitting: Family Medicine

## 2017-03-17 ENCOUNTER — Encounter: Payer: Self-pay | Admitting: Family Medicine

## 2017-03-17 ENCOUNTER — Ambulatory Visit: Payer: Medicare Other | Admitting: Family Medicine

## 2017-03-17 VITALS — BP 132/74 | HR 104 | Temp 98.0°F | Ht 61.0 in | Wt 250.0 lb

## 2017-03-17 DIAGNOSIS — R05 Cough: Secondary | ICD-10-CM | POA: Diagnosis not present

## 2017-03-17 DIAGNOSIS — R059 Cough, unspecified: Secondary | ICD-10-CM | POA: Insufficient documentation

## 2017-03-17 MED ORDER — PROMETHAZINE-CODEINE 6.25-10 MG/5ML PO SYRP: 5.0000 mL | ORAL_SOLUTION | Freq: Four times a day (QID) | ORAL | 0 refills | Status: DC | PRN

## 2017-03-17 MED FILL — PROMETHAZINE-CODEINE SYRUP: 6.25-10 | 4 days supply | Qty: 80 | Fill #0

## 2017-03-17 NOTE — Patient Instructions (Signed)
Please try things such as zyrtec-D or allegra-D which is an antihistamine and decongestant.   Please try afrin which will help with nasal congestion but use for only three days.   Please also try using a netti pot on a regular occasion.  Honey can help with a sore throat.   Delsym can help with a cough.

## 2017-03-17 NOTE — Telephone Encounter (Signed)
Called to check on her- she is being seen in a few minutes by Dr. Raeford Razor. Will defer cough syrup to him

## 2017-03-17 NOTE — Progress Notes (Signed)
Jasmin [name] - 72 y.o. female MRN [id number]  Date of birth: [birth year]  SUBJECTIVE:  Including CC & ROS.  Chief Complaint  Patient presents with  . Cough    Jasmin [name] is a 72 y.o. female that is presenting with a cough. Ongoing for three weeks. She was seen 03/09/17 prescribed Doxycyline and Hycodan with no improvement. Denies body aches and fevers. Deines shortness of breath. The cough is worse at night. She has been using an inhaler to help with wheezing. She reports some production with the cough. Cough medicine has helped with the cough.  Review of Systems  Constitutional: Positive for chills. Negative for fever.  HENT: Positive for rhinorrhea.   Respiratory: Positive for cough. Negative for shortness of breath.   Cardiovascular: Negative for chest pain.  Gastrointestinal: Negative for abdominal pain.  Musculoskeletal: Negative for gait problem.  Neurological: Negative for weakness.  Hematological: Negative for adenopathy.  Psychiatric/Behavioral: Negative for agitation.    HISTORY: Past Medical, Surgical, Social, and Family History Reviewed & Updated per EMR.   Pertinent Historical Findings include:  Past Medical History:  Diagnosis Date  . Acid reflux disease 03/09/2015  . Aortic stenosis 03/09/2015  . AR (allergic rhinitis) 03/09/2015  . Arthritis   . Controlled type 2 diabetes mellitus without complication, without long-term current use of insulin ([place]) 08/08/2016  . 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 Diabetes mellitus without complication ([place])   . Essential hypertension 08/08/2016  . Heart murmur   . 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 History of CVA (cerebrovascular accident) 08/08/2016   Seen on MRI from 08/2015  . Hyperlipidemia   . Hypertension   . 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 IBS (irritable bowel syndrome) 07/16/2015  . Insomnia 07/16/2015  . Left ventricular hypertrophy 07/16/2015  . Mixed hyperlipidemia 08/08/2016  . Morbid  obesity ([place]) 11/10/2015  . Nuclear sclerotic cataract of left eye 07/07/2016  . OAB (overactive bladder) 01/20/2016  . OSA (obstructive sleep apnea) 03/09/2015  . Osteoporosis 03/09/2015  . 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 Pulmonary hypertension ([place]) 07/16/2015  . Right kidney stone   . Sleep apnea   . Stroke ([place])   . UTI (urinary tract infection)   . Vitamin D deficiency 03/09/2015    Past Surgical History:  Procedure Laterality Date  . ABDOMINAL HYSTERECTOMY  09/14/2016  . CATARACT EXTRACTION Left   . CATARACT EXTRACTION Left 06/2016  . COLPORRHAPHY N/A 09/14/2016   [facility]  . CYSTOCELE REPAIR    . FRACTURE SURGERY Left 1953   L arm  . FRACTURE SURGERY Left 1985   L ankle  . HERNIA REPAIR  2004  . JOINT REPLACEMENT Right 2012   R TKA  . JOINT REPLACEMENT Left 2017  . KIDNEY STONE SURGERY    . LAPAROSCOPIC ASSISTED VAGINAL HYSTERECTOMY N/A 09/14/2016   [facility]  . R foot/ankle Right 2013 and 2014   plate and screws lateral foot and tendon removal medial foot in 2013, revision in 2014  . RECTOCELE REPAIR    . URETERAL STENT PLACEMENT    . URETEROSCOPY      Allergies  Allergen Reactions  . Ciprofloxacin Hives  . Metronidazole Hives  . Adhesive [Tape] Rash    Family History  Problem Relation Age of Onset  . Hyperlipidemia Mother   . Heart disease Mother   . Hypertension Mother   . Hypertension Father      Social History   Socioeconomic History  . Marital status: Married  Spouse name: Not on file  . Number of children: Not on file  . Years of education: Not on file  . Highest education level: Not on file  Social Needs  . Financial resource strain: Not on file  . Food insecurity - worry: Not on file  . Food insecurity - inability: Not on file  . Transportation needs - medical: Not on file  . Transportation needs - non-medical: Not on file  Occupational History  . Not on file  Tobacco Use  . Smoking status: Never Smoker  . Smokeless tobacco:  Never Used  Substance and Sexual Activity  . Alcohol use: Yes    Alcohol/week: 0.6 oz    Types: 1 Glasses of wine per week  . Drug use: No  . Sexual activity: No  Other Topics Concern  . Not on file  Social History Narrative  . Not on file     PHYSICAL EXAM:  VS: BP 132/74 (BP Location: Left Arm, Patient Position: Sitting, Cuff Size: Large)   Pulse (!) 104   Temp 98 F (36.7 C) (Oral)   Ht 5\' 1"  (1.549 m)   Wt 250 lb (113.4 kg)   SpO2 96%   BMI 47.24 kg/m  Physical Exam Gen: NAD, alert, cooperative with exam,  ENT: normal lips, normal nasal mucosa, tympanic membranes clear and intact bilaterally, normal oropharynx, no cervical lymphadenopathy Eye: normal EOM, normal conjunctiva and lids CV:  no edema, +2 pedal pulses, regular rhythm, S1-S2   Resp: no accessory muscle use, non-labored, clear to auscultation bilaterally, no crackles or wheezes Skin: no rashes, no areas of induration  Neuro: normal tone, normal sensation to touch Psych:  normal insight, alert and oriented MSK: Normal gait, normal strength       ASSESSMENT & PLAN:   Cough Is post viral in nature. Lungs are clear with no wheezing  - counseled on supportive care  - cough syrup provided  - if no improvement consider chest xray

## 2017-03-17 NOTE — Telephone Encounter (Signed)
Copied from [place] [id number]. Topic: Quick Communication - See Telephone Encounter >> Mar 17, 2017  8:07 AM Jasmin [name] wrote: CRM for notification. See Telephone encounter for:  Patient is feeling better, but her cough is still bad. Can more cough medicine be called in or does she need to be seen? Wandalee Ferdinand on 03/09/17 03/17/17.

## 2017-03-17 NOTE — Assessment & Plan Note (Signed)
Is post viral in nature. Lungs are clear with no wheezing  - counseled on supportive care  - cough syrup provided  - if no improvement consider chest xray

## 2017-03-31 ENCOUNTER — Telehealth: Payer: Self-pay | Admitting: *Deleted

## 2017-03-31 ENCOUNTER — Telehealth: Payer: Self-pay | Admitting: Family Medicine

## 2017-03-31 NOTE — Telephone Encounter (Signed)
Copied from [place]. Topic: Inquiry >> Mar 31, 2017 12:35 PM Corie Chiquito, [place] wrote: Reason for CRM: Altha Harm called from [facility] because the patient is due to have an AWV done for 2019. If someone could contact the patient to schedule this appointment for her AT [phone]

## 2017-03-31 NOTE — Telephone Encounter (Signed)
[employer], Amy phoned to request patient's A1c. Provided information.

## 2017-04-03 NOTE — Telephone Encounter (Signed)
Appt scheduled for 05/04/17 at 8:00 am

## 2017-05-04 ENCOUNTER — Ambulatory Visit: Payer: Medicare Other | Admitting: *Deleted

## 2017-05-15 ENCOUNTER — Encounter: Payer: Self-pay | Admitting: Family Medicine

## 2017-05-15 ENCOUNTER — Telehealth: Payer: Self-pay

## 2017-05-15 DIAGNOSIS — R300 Dysuria: Secondary | ICD-10-CM

## 2017-05-15 NOTE — Telephone Encounter (Signed)
Copied from [place] [phone]. Topic: General - Other >> May 15, 2017 10:28 AM Jasmin [name] wrote: Reason for CRM:Pt states orthopedic  dr wants her to drop off urine sample to rule out uti    because CRP was high

## 2017-05-15 NOTE — Addendum Note (Signed)
Addended by: Lamar Blinks C on: 05/15/2017 12:53 PM   Modules accepted: Orders

## 2017-05-15 NOTE — Telephone Encounter (Signed)
Called her back- this is fine.  Will order UA and culture for her

## 2017-05-15 NOTE — Telephone Encounter (Signed)
LMOM with this info, home number

## 2017-05-23 NOTE — Progress Notes (Signed)
Subjective:   Jasmin [name] is a 72 y.o. female who presents for an Initial Medicare Annual Wellness Visit.  Review of Systems   No ROS.  Medicare Wellness Visit. Additional risk factors are reflected in the social history. Cardiac Risk Factors include: advanced age (>8men, >52 women);diabetes mellitus;dyslipidemia;hypertension;obesity (BMI >30kg/m2)   Sleep patterns: wakes every 2 hrs to urinate. Following with Dr.Puchinsky next week.  Able to fall back to sleep easily. Sleeps 8 hrs overall.  Home Safety/Smoke Alarms: Feels safe in home. Smoke alarms in place.  Living environment; residence and Adult nurse: lives on main level of home. Lives with husband. Exercises on stairs on back deck. Seat Belt Safety/Bike Helmet: Wears seat belt.   Female:       Mammo-  utd     Dexa scan- utd       CCS-utd  Objective:    There were no vitals filed for this visit. There is no height or weight on file to calculate BMI.  Advanced Directives 05/25/2017 10/02/2016 09/24/2015 06/03/2014  Does Patient Have a Medical Advance Directive? Yes No Yes Yes  Type of Paramedic of [place];Living will - Living will;Healthcare Power of [place]  Does patient want to make changes to medical advance directive? - - - No - Patient declined  Copy of Frannie in Chart? No - copy requested - - No - copy requested  Would patient like information on creating a medical advance directive? - Yes (ED - Information included in AVS) - -    Current Medications (verified) Outpatient Encounter Medications as of 05/25/2017  Medication Sig  . acetaminophen (TYLENOL) 500 MG tablet Take by mouth as needed.   [name] atorvastatin (LIPITOR) 20 MG tablet Take 1 tablet (20 mg total) by mouth daily.  . Cholecalciferol (VITAMIN D3) 2000 units TABS Take by mouth. Take one tablet in the AM and one in the PM.  . clopidogrel (PLAVIX) 75 MG tablet Take 1 tablet (75 mg  total) by mouth daily.  [name] desonide (DESOWEN) 0.05 % cream Apply topically as needed.   [name] econazole nitrate 1 % cream Apply topically as needed.   . ezetimibe (ZETIA) 10 MG tablet Take 1 tablet (10 mg total) by mouth daily.  . fluticasone (VERAMYST) 27.5 MCG/SPRAY nasal spray 2 spray each nares q day (Patient taking differently: as needed. 2 spray each nares q day)  . lisinopril (PRINIVIL,ZESTRIL) 10 MG tablet Take 1 tablet (10 mg total) by mouth daily.  . metFORMIN (GLUCOPHAGE) 500 MG tablet Take 1 tablet (500 mg total) by mouth 2 (two) times daily with a meal.  . mirabegron ER (MYRBETRIQ) 50 MG TB24 tablet Take by mouth.  . omega-3 acid ethyl esters (LOVAZA) 1 g capsule Take 2 capsules (2 g total) by mouth 2 (two) times daily.  . promethazine-codeine (PHENERGAN WITH CODEINE) 6.25-10 MG/5ML syrup Take 5 mLs by mouth every 6 (six) hours as needed for cough.  . PSYLLIUM PO Take by mouth.  . fluticasone (FLONASE) 50 MCG/ACT nasal spray   . metroNIDAZOLE (METROCREAM) 0.75 % cream Apply topically 2 (two) times daily.  [name] oxyCODONE-acetaminophen (ROXICET) 5-325 MG/5ML solution Take by mouth every 4 (four) hours as needed for severe pain.  . [DISCONTINUED] albuterol (PROVENTIL HFA;VENTOLIN HFA) 108 (90 Base) MCG/ACT inhaler Inhale 2 puffs into the lungs every 6 (six) hours as needed for wheezing or shortness of breath. (Patient not taking: Reported on 05/24/2017)  . [DISCONTINUED] Calcium Carbonate-Vitamin D3 (  CALCIUM 600-D) 600-400 MG-UNIT TABS Take by mouth.  . [DISCONTINUED] doxycycline (VIBRA-TABS) 100 MG tablet Take 1 tablet (100 mg total) by mouth 2 (two) times daily. Can give caps or generic (Patient not taking: Reported on 05/24/2017)  . [DISCONTINUED] HYDROcodone-homatropine (HYCODAN) 5-1.5 MG/5ML syrup Take 5 mLs by mouth every 8 (eight) hours as needed. (Patient not taking: Reported on 05/24/2017)  . [DISCONTINUED] Melatonin 5 MG CAPS Take 10 mg by mouth.  . [DISCONTINUED] promethazine-codeine  (PHENERGAN WITH CODEINE) 6.25-10 MG/5ML syrup Take 5 mLs by mouth every 6 (six) hours as needed for cough.   No facility-administered encounter medications on file as of 05/25/2017.     Allergies (verified) Ciprofloxacin; Metronidazole; and Adhesive [tape]   History: Past Medical History:  Diagnosis Date  . Acid reflux disease 03/09/2015  . Aortic stenosis 03/09/2015  . AR (allergic rhinitis) 03/09/2015  . Arthritis   . Controlled type 2 diabetes mellitus without complication, without long-term current use of insulin ([place]) 08/08/2016  . 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 Diabetes mellitus without complication ([place])   . Essential hypertension 08/08/2016  . Heart murmur   . 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 History of CVA (cerebrovascular accident) 08/08/2016   Seen on MRI from 08/2015  . Hyperlipidemia   . Hypertension   . 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 IBS (irritable bowel syndrome) 07/16/2015  . Insomnia 07/16/2015  . Left ventricular hypertrophy 07/16/2015  . Mixed hyperlipidemia 08/08/2016  . Morbid obesity ([place]) 11/10/2015  . Nuclear sclerotic cataract of left eye 07/07/2016  . OAB (overactive bladder) 01/20/2016  . OSA (obstructive sleep apnea) 03/09/2015  . Osteoporosis 03/09/2015  . 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 Pulmonary hypertension ([place]) 07/16/2015  . Right kidney stone   . Sleep apnea   . Stroke ([place])   . UTI (urinary tract infection)   . Vitamin D deficiency 03/09/2015   Past Surgical History:  Procedure Laterality Date  . ABDOMINAL HYSTERECTOMY  09/14/2016  . CATARACT EXTRACTION Left   . CATARACT EXTRACTION Left 06/2016  . COLPORRHAPHY N/A 09/14/2016   [facility]  . CYSTOCELE REPAIR    . FRACTURE SURGERY Left 1953   L arm  . FRACTURE SURGERY Left 1985   L ankle  . HERNIA REPAIR  2004  . JOINT REPLACEMENT Right 2012   R TKA  . JOINT REPLACEMENT Left 2017  . KIDNEY STONE SURGERY    . LAPAROSCOPIC ASSISTED VAGINAL HYSTERECTOMY  N/A 09/14/2016   [facility]  . R foot/ankle Right 2013 and 2014   plate and screws lateral foot and tendon removal medial foot in 2013, revision in 2014  . RECTOCELE REPAIR    . URETERAL STENT PLACEMENT    . URETEROSCOPY     Family History  Problem Relation Age of Onset  . Hyperlipidemia Mother   . Heart disease Mother   . Hypertension Mother   . Hypertension Father    Social History   Socioeconomic History  . Marital status: Married    Spouse name: Not on file  . Number of children: Not on file  . Years of education: Not on file  . Highest education level: Not on file  Occupational History  . Not on file  Social Needs  . Financial resource strain: Not on file  . Food insecurity:    Worry: Not on file    Inability: Not on file  . Transportation needs:    Medical: Not on file    Non-medical:  Not on file  Tobacco Use  . Smoking status: Never Smoker  . Smokeless tobacco: Never Used  Substance and Sexual Activity  . Alcohol use: Yes    Alcohol/week: 0.6 oz    Types: 1 Glasses of wine per week    Comment: couple of glasses per week  . Drug use: No  . Sexual activity: Not Currently  Lifestyle  . Physical activity:    Days per week: Not on file    Minutes per session: Not on file  . Stress: Not on file  Relationships  . Social connections:    Talks on phone: Not on file    Gets together: Not on file    Attends religious service: Not on file    Active member of club or organization: Not on file    Attends meetings of clubs or organizations: Not on file    Relationship status: Not on file  Other Topics Concern  . Not on file  Social History Narrative  . Not on file    Tobacco Counseling Counseling given: Not Answered   Clinical Intake: Pain : No/denies pain   Activities of Daily Living In your present state of health, do you have any difficulty performing the following activities: 05/25/2017 08/08/2016  Hearing? N N  Comment pt states she has always had trouble  with right ear. -  Vision? N Y  Comment - Has cataract surgery  Difficulty concentrating or making decisions? N N  Walking or climbing stairs? Y Y  Comment difficulty with knees Knee replacements  Dressing or bathing? N N  Doing errands, shopping? N N  Preparing Food and eating ? N -  Using the Toilet? N -  In the past six months, have you accidently leaked urine? Y -  Comment Following up with Dr.Puchinsky 05/30/17. -  Do you have problems with loss of bowel control? N -  Managing your Medications? N -  Managing your Finances? N -  Housekeeping or managing your Housekeeping? N -  Some recent data might be hidden     Immunizations and Health Maintenance Immunization History  Administered Date(s) Administered  . Influenza, High Dose Seasonal PF 11/23/2016  . Influenza,inj,quad, With Preservative 11/17/2014  . Influenza-Unspecified 12/08/2015  . Pneumococcal Conjugate-13 10/02/2013  . Pneumococcal Polysaccharide-23 02/03/2010  . Tdap 11/10/2010  . Zoster 05/04/2010   There are no preventive care reminders to display for this patient.  Patient Care Team: Copland, Gay Filler, MD as PCP - General (Family Medicine) Charlaine Dalton, MD as Consulting Physician (Pulmonary Disease) Al-Khori, Stann Mainland., MD as Consulting Physician (Cardiology) Donette Larry, MD as Consulting Physician (Orthopedic Surgery) Hollar, Katharine Look, MD as Consulting Physician (Dermatology) Janeann Forehand, MD as Consulting Physician (Sports Medicine) Puschinsky, Fransico Him., MD as Consulting Physician (General Surgery) Pill, Barnabas Lister, MD as Consulting Physician (Orthopedic Surgery) Merilynn Finland., DDS as Consulting Physician (Dentistry) Frazier Butt., MD (Obstetrics and Gynecology)  Indicate any recent Medical Services you may have received from other than [facility] providers in the past year (date may be approximate).     Assessment:   This is a routine wellness examination for  [place]. Physical assessment deferred to PCP.  Hearing/Vision screen Hearing Screening Comments: Able to hear conversational tones w/o difficulty. No issues reported.   Vision Screening Comments: Dr.Divanzo. Next appt 01/21/18. Wears reading glasses.   Dietary issues and exercise activities discussed: Current Exercise Habits: Home exercise routine, Type of exercise: Other - see comments(PT exercises.), Time (Minutes): 30,  Frequency (Times/Week): 7, Weekly Exercise (Minutes/Week): 210, Intensity: Mild, Exercise limited by: None identified Diet (meal preparation, eat out, water intake, caffeinated beverages, dairy products, fruits and vegetables): in general, a "healthy" diet  , well balanced   Goals    . Weight (lb) < 200 lb (90.7 kg)     By eating smaller portions. Eating more vegetables. Eating less red meats. Stay more active.      Depression Screen PHQ 2/9 Scores 05/25/2017 08/08/2016  PHQ - 2 Score 0 0  Exception Documentation - Patient refusal    Fall Risk Fall Risk  05/25/2017 08/08/2016  Falls in the past year? No No    Cognitive Function: MMSE - Mini Mental State Exam 05/25/2017  Orientation to time 5  Orientation to Place 5  Registration 3  Attention/ Calculation 5  Recall 3  Language- name 2 objects 2  Language- repeat 1  Language- follow 3 step command 3  Language- read & follow direction 1  Write a sentence 1  Copy design 1  Total score 30        Screening Tests Health Maintenance  Topic Date Due  . PNA vac Low Risk Adult (2 of 2 - PPSV23) 05/26/2018 (Originally 02/04/2015)  . OPHTHALMOLOGY EXAM  06/21/2017  . INFLUENZA VACCINE  09/21/2017  . HEMOGLOBIN A1C  11/23/2017  . FOOT EXAM  12/14/2017  . MAMMOGRAM  11/04/2018  . TETANUS/TDAP  11/09/2020  . COLONOSCOPY  07/15/2025  . DEXA SCAN  Completed  . Hepatitis C Screening  Completed       Plan:   Follow up with PCP as directed  Continue to eat heart healthy diet (full of fruits, vegetables, whole  grains, lean protein, water--limit salt, fat, and sugar intake) and increase physical activity as tolerated.  Continue doing brain stimulating activities (puzzles, reading, adult coloring books, staying active) to keep memory sharp.   Bring a copy of your living will and/or healthcare power of attorney to your next office visit.   I have personally reviewed and noted the following in the patient's chart:   . Medical and social history . Use of alcohol, tobacco or illicit drugs  . Current medications and supplements . Functional ability and status . Nutritional status . Physical activity . Advanced directives . List of other physicians . Hospitalizations, surgeries, and ER visits in previous 12 months . Vitals . Screenings to include cognitive, depression, and falls . Referrals and appointments  In addition, I have reviewed and discussed with patient certain preventive protocols, quality metrics, and best practice recommendations. A written personalized care plan for preventive services as well as general preventive health recommendations were provided to patient.     Shela Nevin, [place]   05/25/2017

## 2017-05-24 ENCOUNTER — Encounter: Payer: Self-pay | Admitting: Family Medicine

## 2017-05-24 ENCOUNTER — Ambulatory Visit: Payer: Medicare Other | Admitting: Family Medicine

## 2017-05-24 ENCOUNTER — Ambulatory Visit (HOSPITAL_BASED_OUTPATIENT_CLINIC_OR_DEPARTMENT_OTHER)
Admission: RE | Admit: 2017-05-24 | Discharge: 2017-05-24 | Disposition: A | Discharge status: Home / Self Care | Payer: Medicare Other | Source: Ambulatory Visit | Attending: Family Medicine | Admitting: Family Medicine

## 2017-05-24 VITALS — BP 132/76 | HR 88 | Temp 98.0°F | Ht 61.0 in | Wt 255.4 lb

## 2017-05-24 DIAGNOSIS — E119 Type 2 diabetes mellitus without complications: Secondary | ICD-10-CM | POA: Diagnosis not present

## 2017-05-24 DIAGNOSIS — R05 Cough: Secondary | ICD-10-CM | POA: Insufficient documentation

## 2017-05-24 DIAGNOSIS — Z5181 Encounter for therapeutic drug level monitoring: Secondary | ICD-10-CM

## 2017-05-24 DIAGNOSIS — R059 Cough, unspecified: Secondary | ICD-10-CM

## 2017-05-24 DIAGNOSIS — I10 Essential (primary) hypertension: Secondary | ICD-10-CM

## 2017-05-24 LAB — BASIC METABOLIC PANEL
BUN: 13 mg/dL (ref 6–23)
CALCIUM: 9.7 mg/dL (ref 8.4–10.5)
CHLORIDE: 104 meq/L (ref 96–112)
CO2: 29 mEq/L (ref 19–32)
Creatinine, Ser: 0.51 mg/dL (ref 0.40–1.20)
GFR: 125.91 mL/min (ref >60.00)
GLUCOSE: 109 mg/dL — AB (ref 70–99)
POTASSIUM: 4.6 meq/L (ref 3.5–5.1)
SODIUM: 141 meq/L (ref 135–145)

## 2017-05-24 LAB — CBC
HEMATOCRIT: 39.8 % (ref 36.0–46.0)
Hemoglobin: 13.2 g/dL (ref 12.0–15.0)
MCHC: 33.2 g/dL (ref 30.0–36.0)
MCV: 88.2 fl (ref 78.0–100.0)
PLATELETS: 276 10*3/uL (ref 150.0–400.0)
RBC: 4.51 Mil/uL (ref 3.87–5.11)
RDW: 16.1 % — ABNORMAL HIGH (ref 11.5–15.5)
WBC: 6.4 10*3/uL (ref 4.0–10.5)

## 2017-05-24 LAB — HEMOGLOBIN A1C: Hgb A1c MFr Bld: 7.8 % — ABNORMAL HIGH (ref 4.6–6.5)

## 2017-05-24 MED ORDER — PROMETHAZINE-CODEINE 6.25-10 MG/5ML PO SYRP: 5.0000 mL | ORAL_SOLUTION | Freq: Four times a day (QID) | ORAL | 0 refills | Status: DC | PRN

## 2017-05-24 NOTE — Patient Instructions (Addendum)
It was good to see you today- please go to lab and then have your x-ray taken.  Then you can go home, and I will be in touch with your x-ray report later on today Also will be in touch with your bloodwork asap  Please use the cough syrup as needed but remember it will make you drowsy Let me know if not improving over the next few days- Sooner if worse.

## 2017-05-24 NOTE — Progress Notes (Addendum)
[facility] [address] [place], [place] [address] [phone]  Date:  05/24/2017   Name:  Jasmin [name]   DOB:  Jul 19, 1945   MRN:  [id number]  PCP:  Darreld Mclean, MD    Chief Complaint: Cough (c/o cough that started yesterday. Would like refill on Promethazine-Codeine )   History of Present Illness:  Jasmin [name] is a 72 y.o. very pleasant female patient who presents with the following:  Here today with concern of acute illness I last saw her in October to check on her DM She also was seen a couple of times in January with cough and sinusitis   Can do an A1c for her today, as she is due. History of controlled DM treated with metformin Lab Results  Component Value Date   HGBA1C 6.7 (H) 12/14/2016   On 3/22 she had a PET scan per ortho as her CRP was 53- they were concerned about infection of her knees. Thankfully all appeared to be ok, she does have some loose hardware She was found to have a UTI by her urologist, and was treated with abx.    She has noted a runny nose recently, and a cough over the last 2 days Taking a breath would trigger a coughing fit She uses some of the phenergan DM that Percell Miller had given her in January- this helped her a lot. She coughed again this am once the cough syrup wore off. She called [name] this am to make her appt  She is not having really any sx except for runny nose and cough- she otherwise feels well, no body aches, no ST, does not feel like the flu She is using a nasal steroid spray  Reviewed NCCSR- nothing of concern, ok to refill her phenergan with codeine   Patient Active Problem List   Diagnosis Date Noted  . Cough 03/17/2017  . Arthritis 12/16/2016  . UTI (urinary tract infection)   . Stroke ([place])   . Sleep apnea   . Right kidney stone   . Hypertension   . Hyperlipidemia   . Heart murmur   . Diabetes mellitus without complication ([place])   . History of  bilateral knee replacement 12/07/2016  . 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 Mixed hyperlipidemia 08/08/2016  . Controlled type 2 diabetes mellitus without complication, without long-term current use of insulin ([place]) 08/08/2016  . Essential hypertension 08/08/2016  . History of CVA (cerebrovascular accident) 08/08/2016  . Nuclear sclerotic cataract of left eye 07/07/2016  . OAB (overactive bladder) 01/20/2016  . Morbid obesity ([place]) 11/10/2015  . IBS (irritable bowel syndrome) 07/16/2015  . Insomnia 07/16/2015  . Left ventricular hypertrophy 07/16/2015  . Pulmonary hypertension ([place]) 07/16/2015  . 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 Acid reflux disease 03/09/2015  . Aortic stenosis 03/09/2015  . AR (allergic rhinitis) 03/09/2015  . 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 OSA (obstructive sleep apnea) 03/09/2015  . Osteoporosis 03/09/2015  . Vitamin D deficiency 03/09/2015    Past Medical History:  Diagnosis Date  . Acid reflux disease 03/09/2015  . Aortic stenosis 03/09/2015  . AR (allergic rhinitis) 03/09/2015  . Arthritis   . Controlled type 2 diabetes mellitus without complication, without long-term current use of insulin ([place]) 08/08/2016  . 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 Diabetes mellitus without complication ([place])   . Essential hypertension 08/08/2016  . Heart murmur   . History of bilateral knee  replacement 12/07/2016  . History of CVA (cerebrovascular accident) 08/08/2016   Seen on MRI from 08/2015  . Hyperlipidemia   . Hypertension   . 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 IBS (irritable bowel syndrome) 07/16/2015  . Insomnia 07/16/2015  . Left ventricular hypertrophy 07/16/2015  . Mixed hyperlipidemia 08/08/2016  . Morbid obesity ([place]) 11/10/2015  . Nuclear sclerotic cataract of left eye 07/07/2016  . OAB (overactive bladder) 01/20/2016  . OSA (obstructive sleep apnea) 03/09/2015  . Osteoporosis 03/09/2015  . Primary osteoarthritis of left knee  03/31/2015  . Pulmonary hypertension ([place]) 07/16/2015  . Right kidney stone   . Sleep apnea   . Stroke ([place])   . UTI (urinary tract infection)   . Vitamin D deficiency 03/09/2015    Past Surgical History:  Procedure Laterality Date  . ABDOMINAL HYSTERECTOMY  09/14/2016  . CATARACT EXTRACTION Left   . CATARACT EXTRACTION Left 06/2016  . COLPORRHAPHY N/A 09/14/2016   [facility]  . CYSTOCELE REPAIR    . FRACTURE SURGERY Left 1953   L arm  . FRACTURE SURGERY Left 1985   L ankle  . HERNIA REPAIR  2004  . JOINT REPLACEMENT Right 2012   R TKA  . JOINT REPLACEMENT Left 2017  . KIDNEY STONE SURGERY    . LAPAROSCOPIC ASSISTED VAGINAL HYSTERECTOMY N/A 09/14/2016   [facility]  . R foot/ankle Right 2013 and 2014   plate and screws lateral foot and tendon removal medial foot in 2013, revision in 2014  . RECTOCELE REPAIR    . URETERAL STENT PLACEMENT    . URETEROSCOPY      Social History   Tobacco Use  . Smoking status: Never Smoker  . Smokeless tobacco: Never Used  Substance Use Topics  . Alcohol use: Yes    Alcohol/week: 0.6 oz    Types: 1 Glasses of wine per week  . Drug use: No    Family History  Problem Relation Age of Onset  . Hyperlipidemia Mother   . Heart disease Mother   . Hypertension Mother   . Hypertension Father     Allergies  Allergen Reactions  . Ciprofloxacin Hives  . Metronidazole Hives  . Adhesive [Tape] Rash    Medication list has been reviewed and updated.  Current Outpatient Medications on File Prior to Visit  Medication Sig Dispense Refill  . acetaminophen (TYLENOL) 500 MG tablet Take by mouth as needed.     [name] atorvastatin (LIPITOR) 20 MG tablet Take 1 tablet (20 mg total) by mouth daily. 90 tablet 3  . Cholecalciferol (VITAMIN D3) 2000 units TABS Take by mouth. Take one tablet in the AM and one in the PM.    . clopidogrel (PLAVIX) 75 MG tablet Take 1 tablet (75 mg total) by mouth daily. 90 tablet 3  . desonide (DESOWEN) 0.05 % cream Apply topically as  needed.     [name] econazole nitrate 1 % cream Apply topically as needed.     . ezetimibe (ZETIA) 10 MG tablet Take 1 tablet (10 mg total) by mouth daily. 90 tablet 3  . fluticasone (VERAMYST) 27.5 MCG/SPRAY nasal spray 2 spray each nares q day (Patient taking differently: as needed. 2 spray each nares q day) 10 g 32  . lisinopril (PRINIVIL,ZESTRIL) 10 MG tablet Take 1 tablet (10 mg total) by mouth daily. 90 tablet 3  . metFORMIN (GLUCOPHAGE) 500 MG tablet Take 1 tablet (500 mg total) by mouth 2 (two) times daily with a meal. 180 tablet 3  .  metroNIDAZOLE (METROCREAM) 0.75 % cream Apply topically 2 (two) times daily.    . mirabegron ER (MYRBETRIQ) 50 MG TB24 tablet Take by mouth.    . omega-3 acid ethyl esters (LOVAZA) 1 g capsule Take 2 capsules (2 g total) by mouth 2 (two) times daily. 360 capsule 3  . oxyCODONE-acetaminophen (ROXICET) 5-325 MG/5ML solution Take by mouth every 4 (four) hours as needed for severe pain.    [name] PSYLLIUM PO Take by mouth.     No current facility-administered medications on file prior to visit.     Review of Systems:  As per HPI- otherwise negative.   Physical Examination: Vitals:   05/24/17 0934  BP: 132/76  Pulse: 88  Temp: 98 F (36.7 C)  SpO2: 98%   Vitals:   05/24/17 0934  Weight: 255 lb 6.4 oz (115.8 kg)  Height: 5\' 1"  (1.549 m)   Body mass index is 48.26 kg/m. Ideal Body Weight: Weight in (lb) to have BMI = 25: 132  GEN: WDWN, NAD, Non-toxic, A & O x 3, obese, otherwise looks well  HEENT: Atraumatic, Normocephalic. Neck supple. No masses, No LAD.  Bilateral TM wnl, oropharynx normal.  PEERL,EOMI.   Nasal cavity is inflamed Ears and Nose: No external deformity. CV: RRR, No M/G/R. No JVD. No thrill. No extra heart sounds. PULM: CTA B, no wheezes, crackles, rhonchi. No retractions. No resp. distress. No accessory muscle use. ABD: S, NT, ND EXTR: No c/c/e NEURO Normal gait.  PSYCH: Normally interactive. Conversant. Not depressed or anxious  appearing.  Calm demeanor.    Assessment and Plan: Cough - Plan: promethazine-codeine (PHENERGAN WITH CODEINE) 6.25-10 MG/5ML syrup, DG Chest 2 View  Controlled type 2 diabetes mellitus without complication, without long-term current use of insulin ([place]) - Plan: Basic metabolic panel, Hemoglobin A1c  Essential hypertension - Plan: CBC  Medication monitoring encounter - Plan: Basic metabolic panel, CBC  Here today with likely viral URI Refilled her cough syrup for her, cautioned regarding sedation Will also obtain routine labs for her today as above- Will plan further follow- up pending labs and x-ray  Signed Lamar Blinks, MD  Received her x-ray Dg Chest 2 View  Result Date: 05/24/2017 CLINICAL DATA:  Cough. EXAM: CHEST - 2 VIEW COMPARISON:  12/13/2011. FINDINGS: Trachea is midline. Heart size stable. Thoracic aorta is calcified. Lungs are clear. No pleural fluid. Flowing anterior osteophytosis in the thoracic spine. IMPRESSION: No acute findings. Electronically Signed   By: Lorin Picket M.D.   On: 05/24/2017 10:53   Message to pt about normal CXR  Received her labs  Results for orders placed or performed in visit on 60/63/01  Basic metabolic panel  Result Value Ref Range   Sodium 141 135 - 145 mEq/L   Potassium 4.6 3.5 - 5.1 mEq/L   Chloride 104 96 - 112 mEq/L   CO2 29 19 - 32 mEq/L   Glucose, Bld 109 (H) 70 - 99 mg/dL   BUN 13 6 - 23 mg/dL   Creatinine, Ser 0.51 0.40 - 1.20 mg/dL   Calcium 9.7 8.4 - 10.5 mg/dL   GFR 125.91 >60.00 mL/min  Hemoglobin A1c  Result Value Ref Range   Hgb A1c MFr Bld 7.8 (H) 4.6 - 6.5 %  CBC  Result Value Ref Range   WBC 6.4 4.0 - 10.5 K/uL   RBC 4.51 3.87 - 5.11 Mil/uL   Platelets 276.0 150.0 - 400.0 K/uL   Hemoglobin 13.2 12.0 - 15.0 g/dL   HCT 39.8 36.0 -  46.0 %   MCV 88.2 78.0 - 100.0 fl   MCHC 33.2 30.0 - 36.0 g/dL   RDW 16.1 (H) 11.5 - 15.5 %

## 2017-05-25 ENCOUNTER — Ambulatory Visit (INDEPENDENT_AMBULATORY_CARE_PROVIDER_SITE_OTHER): Payer: Medicare Other | Admitting: *Deleted

## 2017-05-25 ENCOUNTER — Encounter: Payer: Self-pay | Admitting: *Deleted

## 2017-05-25 VITALS — BP 140/62 | HR 79 | Ht 61.0 in | Wt 252.4 lb

## 2017-05-25 DIAGNOSIS — Z0000 Encounter for general adult medical examination without abnormal findings: Secondary | ICD-10-CM

## 2017-05-25 NOTE — Progress Notes (Signed)
I have reviewed note and agree with documentation by Ms. Vevelyn Royals

## 2017-05-25 NOTE — Patient Instructions (Signed)
Follow up with PCP as directed  Continue to eat heart healthy diet (full of fruits, vegetables, whole grains, lean protein, water--limit salt, fat, and sugar intake) and increase physical activity as tolerated.  Continue doing brain stimulating activities (puzzles, reading, adult coloring books, staying active) to keep memory sharp.   Bring a copy of your living will and/or healthcare power of attorney to your next office visit.   Jasmin [name] , Thank you for taking time to come for your Medicare Wellness Visit. I appreciate your ongoing commitment to your health goals. Please review the following plan we discussed and let me know if I can assist you in the future.   These are the goals we discussed: Goals    . Weight (lb) < 200 lb (90.7 kg)     By eating smaller portions. Eating more vegetables. Eating less red meats. Stay more active.       This is a list of the screening recommended for you and due dates:  Health Maintenance  Topic Date Due  . Pneumonia vaccines (2 of 2 - PPSV23) 05/26/2018*  . Eye exam for diabetics  06/21/2017  . Flu Shot  09/21/2017  . Hemoglobin A1C  11/23/2017  . Complete foot exam   12/14/2017  . Mammogram  11/04/2018  . Tetanus Vaccine  11/09/2020  . Colon Cancer Screening  07/15/2025  . DEXA scan (bone density measurement)  Completed  .  Hepatitis C: One time screening is recommended by Center for Disease Control  (CDC) for  adults born from [birth year] through 1965.   Completed  *Topic was postponed. The date shown is not the original due date.    Preventive Care 84 Years and Older, Female Preventive care refers to lifestyle choices and visits with your health care provider that can promote health and wellness. What does preventive care include?  A yearly physical exam. This is also called an annual well check.  Dental exams once or twice a year.  Routine eye exams. Ask your health care provider how often you should have your eyes  checked.  Personal lifestyle choices, including: ? Daily care of your teeth and gums. ? Regular physical activity. ? Eating a healthy diet. ? Avoiding tobacco and drug use. ? Limiting alcohol use. ? Practicing safe sex. ? Taking low-dose aspirin every day. ? Taking vitamin and mineral supplements as recommended by your health care provider. What happens during an annual well check? The services and screenings done by your health care provider during your annual well check will depend on your age, overall health, lifestyle risk factors, and family history of disease. Counseling Your health care provider may ask you questions about your:  Alcohol use.  Tobacco use.  Drug use.  Emotional well-being.  Home and relationship well-being.  Sexual activity.  Eating habits.  History of falls.  Memory and ability to understand (cognition).  Work and work Statistician.  Reproductive health.  Screening You may have the following tests or measurements:  Height, weight, and BMI.  Blood pressure.  Lipid and cholesterol levels. These may be checked every 5 years, or more frequently if you are over 36 years old.  Skin check.  Lung cancer screening. You may have this screening every year starting at age 31 if you have a 30-pack-year history of smoking and currently smoke or have quit within the past 15 years.  Fecal occult blood test (FOBT) of the stool. You may have this test every year starting at age 22.  Flexible sigmoidoscopy or colonoscopy. You may have a sigmoidoscopy every 5 years or a colonoscopy every 10 years starting at age 78.  Hepatitis C blood test.  Hepatitis B blood test.  Sexually transmitted disease (STD) testing.  Diabetes screening. This is done by checking your blood sugar (glucose) after you have not eaten for a while (fasting). You may have this done every 1-3 years.  Bone density scan. This is done to screen for osteoporosis. You may have this done  starting at age 40.  Mammogram. This may be done every 1-2 years. Talk to your health care provider about how often you should have regular mammograms.  Talk with your health care provider about your test results, treatment options, and if necessary, the need for more tests. Vaccines Your health care provider may recommend certain vaccines, such as:  Influenza vaccine. This is recommended every year.  Tetanus, diphtheria, and acellular pertussis (Tdap, Td) vaccine. You may need a Td booster every 10 years.  Varicella vaccine. You may need this if you have not been vaccinated.  Zoster vaccine. You may need this after age 79.  Measles, mumps, and rubella (MMR) vaccine. You may need at least one dose of MMR if you were born in 1957 or later. You may also need a second dose.  Pneumococcal 13-valent conjugate (PCV13) vaccine. One dose is recommended after age 9.  Pneumococcal polysaccharide (PPSV23) vaccine. One dose is recommended after age 38.  Meningococcal vaccine. You may need this if you have certain conditions.  Hepatitis A vaccine. You may need this if you have certain conditions or if you travel or work in places where you may be exposed to hepatitis A.  Hepatitis B vaccine. You may need this if you have certain conditions or if you travel or work in places where you may be exposed to hepatitis B.  Haemophilus influenzae type b (Hib) vaccine. You may need this if you have certain conditions.  Talk to your health care provider about which screenings and vaccines you need and how often you need them. This information is not intended to replace advice given to you by your health care provider. Make sure you discuss any questions you have with your health care provider. Document Released: 03/06/2015 Document Revised: 10/28/2015 Document Reviewed: 12/09/2014 Elsevier Interactive Patient Education  [employer].

## 2017-06-14 ENCOUNTER — Telehealth: Payer: Self-pay | Admitting: Family Medicine

## 2017-06-14 NOTE — Telephone Encounter (Signed)
Copied from [place] [id number]. Topic: Quick Communication - See Telephone Encounter >> Jun 14, 2017  2:39 PM Synthia Innocent wrote: CRM for notification. See Telephone encounter for: 06/14/17. Patient states she has never had measle injection, is this needed?

## 2017-06-14 NOTE — Telephone Encounter (Signed)
Called and spoke with pt she is born prior to [birth year] so this is evidence of immunity per the CDC.  Nothing further needed

## 2017-06-15 ENCOUNTER — Encounter (INDEPENDENT_AMBULATORY_CARE_PROVIDER_SITE_OTHER): Payer: Self-pay

## 2017-06-21 ENCOUNTER — Encounter (INDEPENDENT_AMBULATORY_CARE_PROVIDER_SITE_OTHER): Payer: Self-pay | Admitting: Family Medicine

## 2017-06-21 ENCOUNTER — Ambulatory Visit (INDEPENDENT_AMBULATORY_CARE_PROVIDER_SITE_OTHER): Payer: Medicare Other | Admitting: Family Medicine

## 2017-06-21 VITALS — BP 142/73 | HR 68 | Temp 97.5°F | Ht 61.0 in | Wt 248.0 lb

## 2017-06-21 DIAGNOSIS — E559 Vitamin D deficiency, unspecified: Secondary | ICD-10-CM

## 2017-06-21 DIAGNOSIS — E119 Type 2 diabetes mellitus without complications: Secondary | ICD-10-CM | POA: Diagnosis not present

## 2017-06-21 DIAGNOSIS — Z1331 Encounter for screening for depression: Secondary | ICD-10-CM

## 2017-06-21 DIAGNOSIS — Z6841 Body Mass Index (BMI) 40.0 and over, adult: Secondary | ICD-10-CM | POA: Diagnosis not present

## 2017-06-21 DIAGNOSIS — E538 Deficiency of other specified B group vitamins: Secondary | ICD-10-CM | POA: Diagnosis not present

## 2017-06-21 DIAGNOSIS — R5383 Other fatigue: Secondary | ICD-10-CM

## 2017-06-21 DIAGNOSIS — I10 Essential (primary) hypertension: Secondary | ICD-10-CM | POA: Diagnosis not present

## 2017-06-21 DIAGNOSIS — R0600 Dyspnea, unspecified: Secondary | ICD-10-CM

## 2017-06-21 DIAGNOSIS — R0609 Other forms of dyspnea: Secondary | ICD-10-CM

## 2017-06-21 DIAGNOSIS — Z0289 Encounter for other administrative examinations: Secondary | ICD-10-CM

## 2017-06-21 DIAGNOSIS — E66813 Obesity, class 3: Secondary | ICD-10-CM

## 2017-06-22 LAB — TSH: TSH: 1.61 u[IU]/mL (ref 0.450–4.500)

## 2017-06-22 LAB — LIPID PANEL WITH LDL/HDL RATIO
CHOLESTEROL TOTAL: 119 mg/dL (ref 100–199)
HDL: 45 mg/dL (ref >39)
LDL CALC: 38 mg/dL (ref 0–99)
LDl/HDL Ratio: 0.8 ratio (ref 0.0–3.2)
TRIGLYCERIDES: 178 mg/dL — AB (ref 0–149)
VLDL Cholesterol Cal: 36 mg/dL (ref 5–40)

## 2017-06-22 LAB — VITAMIN D 25 HYDROXY (VIT D DEFICIENCY, FRACTURES): Vit D, 25-Hydroxy: 36.3 ng/mL (ref 30.0–100.0)

## 2017-06-22 LAB — VITAMIN B12: Vitamin B-12: 394 pg/mL (ref 232–1245)

## 2017-06-22 LAB — COMPREHENSIVE METABOLIC PANEL
A/G RATIO: 2 (ref 1.2–2.2)
ALK PHOS: 64 IU/L (ref 39–117)
ALT: 10 IU/L (ref 0–32)
AST: 15 IU/L (ref 0–40)
Albumin: 4.3 g/dL (ref 3.5–4.8)
BUN/Creatinine Ratio: 22 (ref 12–28)
BUN: 10 mg/dL (ref 8–27)
Bilirubin Total: 0.7 mg/dL (ref 0.0–1.2)
CO2: 23 mmol/L (ref 20–29)
CREATININE: 0.46 mg/dL — AB (ref 0.57–1.00)
Calcium: 9 mg/dL (ref 8.7–10.3)
Chloride: 103 mmol/L (ref 96–106)
GFR calc Af Amer: 115 mL/min/{1.73_m2} (ref >59)
GFR, EST NON AFRICAN AMERICAN: 100 mL/min/{1.73_m2} (ref >59)
Globulin, Total: 2.2 g/dL (ref 1.5–4.5)
Glucose: 111 mg/dL — ABNORMAL HIGH (ref 65–99)
POTASSIUM: 4.3 mmol/L (ref 3.5–5.2)
Sodium: 142 mmol/L (ref 134–144)
Total Protein: 6.5 g/dL (ref 6.0–8.5)

## 2017-06-22 LAB — MICROALBUMIN, URINE: MICROALBUM., U, RANDOM: 3.5 ug/mL

## 2017-06-22 LAB — INSULIN, RANDOM: INSULIN: 17.6 u[IU]/mL (ref 2.6–24.9)

## 2017-06-22 LAB — T3: T3 TOTAL: 125 ng/dL (ref 71–180)

## 2017-06-22 LAB — T4, FREE: Free T4: 1.14 ng/dL (ref 0.82–1.77)

## 2017-06-28 NOTE — Progress Notes (Signed)
.  Office: [phone]  /  Fax: [phone]   HPI:   Chief Complaint: OBESITY  Jasmin [name] (MR# [id number]) is a 72 y.o. female who presents on 06/21/2017 for obesity evaluation and treatment. Current BMI is Body mass index is 46.86 kg/m.Jasmin [name] [name] has struggled with obesity for years and has been unsuccessful in either losing weight or maintaining long term weight loss. Jasmin [name] attended our information session and states she is currently in the action stage of change and ready to dedicate time achieving and maintaining a healthier weight.   Jasmin [name] states her family eats meals together she thinks her family will eat healthier with  her her desired weight loss is 128 lbs she started gaining weight in 1981(pregnacy) her heaviest weight ever was 255 lbs she has significant food cravings issues  she skips meals frequently she is frequently drinking liquids with calories she frequently makes poor food choices she struggles with emotional eating    Jasmin [name] feels her energy is lower than it should be. This has worsened with weight gain and has not worsened recently. Jasmin [name] admits to daytime somnolence and  admits to waking up still tired. Patient has a history of obstructive sleep apnea with the use of CPAP. Patent has a history of symptoms of daytime Jasmin. Patient generally gets 9 hours of sleep per night, and states they generally have nightime awakenings. Snoring are not present. Apneic episodes are present. Epworth Sleepiness Score is 4.  Dyspnea on exertion Jasmin [name] notes increasing shortness of breath with exercising and seems to be worsening over time with weight gain. She notes getting out of breath sooner with activity than she used to. This has not gotten worse recently. Jasmin [name] denies orthopnea.  Diabetes II Jasmin [name] has a diagnosis of diabetes type II. Jasmin [name] is on metformin, last A1c in Epic was 7.8. She states fasting BGs this morning is 109. No log today. She  denies any hypoglycemic episodes. She has been working on intensive lifestyle modifications including diet, exercise, and weight loss to help control her blood glucose levels.  Vitamin D Deficiency Jasmin [name] has a diagnosis of vitamin D deficiency. She is on OTC Vit D, no recent labs. She notes Jasmin and denies nausea, vomiting or muscle weakness.  Vitamin B12 Deficiency Jasmin [name] has a diagnosis of B12 insufficiency and notes Jasmin. This is likely due to metformin therapy, no recent labs. She is not a vegetarian and does not have a previous diagnosis of pernicious anemia. She does not have a history of weight loss surgery.   Hypertension Jasmin [name] is a 72 y.o. female with hypertension. Jasmin [name] has a history of stroke in 2017, blood pressure is elevated today. She is on multiple medications and she denies chest pain or headache. She is working weight loss to help control her blood pressure with the goal of decreasing her risk of heart attack and stroke. Jasmin [name]'s blood pressure is not currently controlled.  Depression Screen Jasmin [name]'s Food and Mood (modified PHQ-9) score was  Depression screen PHQ 2/9 06/21/2017  Decreased Interest 3  Down, Depressed, Hopeless 1  PHQ - 2 Score 4  Altered sleeping 3  Tired, decreased energy 3  Change in appetite 1  Feeling bad or failure about yourself  1  Trouble concentrating 1  Moving slowly or fidgety/restless 2  Suicidal thoughts 0  PHQ-9 Score 15  Difficult doing work/chores Not difficult at all    ALLERGIES: Allergies  Allergen Reactions  . Ciprofloxacin Hives  . Metronidazole Hives  . Adhesive [  Tape] Rash    MEDICATIONS: Current Outpatient Medications on File Prior to Visit  Medication Sig Dispense Refill  . acetaminophen (TYLENOL) 500 MG tablet Take by mouth as needed.     Jasmin [name] atorvastatin (LIPITOR) 20 MG tablet Take 1 tablet (20 mg total) by mouth daily. 90 tablet 3  . Cholecalciferol (VITAMIN D3) 2000 units TABS Take by mouth.  Take one tablet in the AM and one in the PM.    . clopidogrel (PLAVIX) 75 MG tablet Take 1 tablet (75 mg total) by mouth daily. 90 tablet 3  . desonide (DESOWEN) 0.05 % cream Apply topically as needed.     Jasmin [name] econazole nitrate 1 % cream Apply topically as needed.     . ezetimibe (ZETIA) 10 MG tablet Take 1 tablet (10 mg total) by mouth daily. 90 tablet 3  . fluticasone (FLONASE) 50 MCG/ACT nasal spray     . lisinopril (PRINIVIL,ZESTRIL) 10 MG tablet Take 1 tablet (10 mg total) by mouth daily. 90 tablet 3  . metFORMIN (GLUCOPHAGE) 500 MG tablet Take 1 tablet (500 mg total) by mouth 2 (two) times daily with a meal. 180 tablet 3  . metroNIDAZOLE (METROCREAM) 0.75 % cream Apply topically 2 (two) times daily.    . mirabegron ER (MYRBETRIQ) 50 MG TB24 tablet Take by mouth.    . omega-3 acid ethyl esters (LOVAZA) 1 g capsule Take 2 capsules (2 g total) by mouth 2 (two) times daily. 360 capsule 3  . oxyCODONE-acetaminophen (ROXICET) 5-325 MG/5ML solution Take by mouth every 4 (four) hours as needed for severe pain.    . promethazine-codeine (PHENERGAN WITH CODEINE) 6.25-10 MG/5ML syrup Take 5 mLs by mouth every 6 (six) hours as needed for cough. 80 mL 0  . PSYLLIUM PO Take by mouth.     No current facility-administered medications on file prior to visit.     PAST MEDICAL HISTORY: Past Medical History:  Diagnosis Date  . Acid reflux disease 03/09/2015  . Aortic stenosis 03/09/2015  . AR (allergic rhinitis) 03/09/2015  . Arthritis   . Back pain   . Controlled type 2 diabetes mellitus without complication, without long-term current use of insulin ([place]) 08/08/2016  . Cough   . 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 Decreased hearing   . Diabetes mellitus without complication ([place])   . Dry mouth   . Easy bruising   . Essential hypertension 08/08/2016  . Heart murmur   . 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 History of CVA (cerebrovascular accident) 08/08/2016   Seen on MRI from 08/2015    . Hyperlipidemia   . Hypertension   . 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 IBS (irritable bowel syndrome) 07/16/2015  . Insomnia 07/16/2015  . Joint pain   . Knee pain   . Left ventricular hypertrophy 07/16/2015  . Leg cramping    right leg  . Mixed hyperlipidemia 08/08/2016  . Morbid obesity ([place]) 11/10/2015  . Nasal discharge   . Nuclear sclerotic cataract of left eye 07/07/2016  . OAB (overactive bladder) 01/20/2016  . OSA (obstructive sleep apnea) 03/09/2015  . Osteoporosis 03/09/2015  . 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 Pulmonary hypertension ([place]) 07/16/2015  . Red eyes   . Right kidney stone   . Shortness of breath on exertion   . Sleep apnea   . Stroke ([place])   . Swelling of both lower extremities   . UTI (urinary tract infection)   . Vitamin D deficiency 03/09/2015  PAST SURGICAL HISTORY: Past Surgical History:  Procedure Laterality Date  . ABDOMINAL HYSTERECTOMY  09/14/2016  . CATARACT EXTRACTION Left   . CATARACT EXTRACTION Left 06/2016  . COLPORRHAPHY N/A 09/14/2016   [facility]  . CYSTOCELE REPAIR    . FRACTURE SURGERY Left 1953   L arm  . FRACTURE SURGERY Left 1985   L ankle  . HERNIA REPAIR  2004  . JOINT REPLACEMENT Right 2012   R TKA  . JOINT REPLACEMENT Left 2017  . KIDNEY STONE SURGERY    . LAPAROSCOPIC ASSISTED VAGINAL HYSTERECTOMY N/A 09/14/2016   [facility]  . R foot/ankle Right 2013 and 2014   plate and screws lateral foot and tendon removal medial foot in 2013, revision in 2014  . RECTOCELE REPAIR    . URETERAL STENT PLACEMENT    . URETEROSCOPY      SOCIAL HISTORY: Social History   Tobacco Use  . Smoking status: Never Smoker  . Smokeless tobacco: Never Used  Substance Use Topics  . Alcohol use: Yes    Alcohol/week: 0.6 oz    Types: 1 Glasses of wine per week    Comment: couple of glasses per week  . Drug use: No    FAMILY HISTORY: Family History  Problem Relation Age of Onset  . Hyperlipidemia Mother   . Heart disease Mother    . Hypertension Mother   . Stroke Mother   . Thyroid disease Mother   . Hypertension Father   . Heart disease Father     ROS: Review of Systems  Constitutional: Positive for malaise/Jasmin. Negative for weight loss.       + Trouble sleeping  HENT:       + Decreased hearing + Nasal discharge + Dry mouth (at night)  Eyes: Positive for redness.       + Wear glasses or contacts  Respiratory: Positive for cough and shortness of breath (with exertion).   Cardiovascular: Negative for chest pain and orthopnea.       + Leg cramping (right leg)  Gastrointestinal: Negative for nausea and vomiting.  Genitourinary: Positive for frequency (after surgery July 25th).  Musculoskeletal: Positive for back pain.       Negative muscle weakness + Muscle or joint pain (knees)  Neurological: Negative for headaches.  Endo/Heme/Allergies: Bruises/bleeds easily (takes plavix).    PHYSICAL EXAM: Blood pressure (!) 142/73, pulse 68, temperature (!) 97.5 F (36.4 C), temperature source Oral, height 5\' 1"  (1.549 m), weight 248 lb (112.5 kg), SpO2 98 %. Body mass index is 46.86 kg/m. Physical Exam  Constitutional: She is oriented to person, place, and time. She appears well-developed and well-nourished.  HENT:  Head: Normocephalic and atraumatic.  Nose: Nose normal.  Eyes: EOM are normal. No scleral icterus.  Neck: Normal range of motion. Neck supple. No thyromegaly present.  Cardiovascular: Normal rate and regular rhythm.  Murmur (3/6 early systolic murmur) heard. Pulmonary/Chest: Effort normal. No respiratory distress.  Abdominal: Soft. There is no tenderness.  Musculoskeletal:  Range of Motion normal in all 4 extremities 1+ edema noted in bilateral lower extremities  Neurological: She is alert and oriented to person, place, and time. Coordination normal.  Skin: Skin is warm and dry.  Psychiatric: She has a normal mood and affect. Her behavior is normal.  Vitals reviewed.   RECENT LABS AND  TESTS: BMET    Component Value Date/Time   NA 142 06/21/2017 1057   K 4.3 06/21/2017 1057   CL 103 06/21/2017 1057  CO2 23 06/21/2017 1057   GLUCOSE 111 (H) 06/21/2017 1057   GLUCOSE 109 (H) 05/24/2017 1023   BUN 10 06/21/2017 1057   CREATININE 0.46 (L) 06/21/2017 1057   CALCIUM 9.0 06/21/2017 1057   GFRNONAA 100 06/21/2017 1057   GFRAA 115 06/21/2017 1057   Lab Results  Component Value Date   HGBA1C 7.8 (H) 05/24/2017   Lab Results  Component Value Date   INSULIN 17.6 06/21/2017   CBC    Component Value Date/Time   WBC 6.4 05/24/2017 1023   RBC 4.51 05/24/2017 1023   HGB 13.2 05/24/2017 1023   HCT 39.8 05/24/2017 1023   PLT 276.0 05/24/2017 1023   MCV 88.2 05/24/2017 1023   MCHC 33.2 05/24/2017 1023   RDW 16.1 (H) 05/24/2017 1023   Iron/TIBC/Ferritin/ %Sat No results found for: IRON, TIBC, FERRITIN, IRONPCTSAT Lipid Panel     Component Value Date/Time   CHOL 119 06/21/2017 1057   TRIG 178 (H) 06/21/2017 1057   HDL 45 06/21/2017 1057   CHOLHDL 3 12/14/2016 0856   VLDL 51.4 (H) 12/14/2016 0856   LDLCALC 38 06/21/2017 1057   LDLDIRECT 66.0 12/14/2016 0856   Hepatic Function Panel     Component Value Date/Time   PROT 6.5 06/21/2017 1057   ALBUMIN 4.3 06/21/2017 1057   AST 15 06/21/2017 1057   ALT 10 06/21/2017 1057   ALKPHOS 64 06/21/2017 1057   BILITOT 0.7 06/21/2017 1057      Component Value Date/Time   TSH 1.610 06/21/2017 1057   Vitamin D No recent labs  ECG  shows NSR with a rate of 85 BPM INDIRECT CALORIMETER done today shows a VO2 of 188 and a REE of 1306. Her calculated basal metabolic rate is 9924 thus her basal metabolic rate is worse than expected.    ASSESSMENT AND PLAN: Other Jasmin - Plan: Lipid Panel With LDL/HDL Ratio, TSH, T4, free, T3  Dyspnea on exertion  Type 2 diabetes mellitus without complication, without long-term current use of insulin (HCC) - Plan: Insulin, random, Microalbumin, urine  Vitamin D deficiency - Plan:  VITAMIN D 25 Hydroxy (Vit-D Deficiency, Fractures)  Vitamin B 12 deficiency - Plan: Vitamin B12  Essential hypertension - Plan: Comprehensive metabolic panel  Depression screening  Class 3 severe obesity with serious comorbidity and body mass index (BMI) of 45.0 to 49.9 in adult, unspecified obesity type ([place])  PLAN:  Jasmin Moni was informed that her Jasmin may be related to obesity, depression or many other causes. Labs will be ordered, and in the meanwhile Peggi has agreed to work on diet, exercise and weight loss to help with Jasmin. Proper sleep hygiene was discussed including the need for 7-8 hours of quality sleep each night. A sleep study was not ordered based on symptoms and Epworth score.  Dyspnea on exertion Maryjo's shortness of breath appears to be obesity related and exercise induced. She has agreed to work on weight loss and gradually increase exercise to treat her exercise induced shortness of breath. If Jaiyana follows our instructions and loses weight without improvement of her shortness of breath, we will plan to refer to pulmonology. We will monitor this condition regularly. Andrienne agrees to this plan.  Diabetes II Carisa has been given extensive diabetes education by myself today including ideal fasting and post-prandial blood glucose readings, individual ideal Hgb A1c goals and hypoglycemia prevention. We discussed the importance of good blood sugar control to decrease the likelihood of diabetic complications such as nephropathy, neuropathy, limb loss,  blindness, coronary artery disease, and death. We discussed the importance of intensive lifestyle modification including diet, exercise and weight loss as the first line treatment for diabetes. Cami agrees to continue metformin and start diet prescription. We will check labs and Xochilth agrees to follow up with our clinic in 2 weeks.  Vitamin D Deficiency Elvis was informed that low vitamin D levels  contributes to Jasmin and are associated with obesity, breast, and colon cancer. Pinky agrees to continue taking OTC Vit D for now and will follow up for routine testing of vitamin D, at least 2-3 times per year. She was informed of the risk of over-replacement of vitamin D and agrees to not increase her dose unless she discusses this with [name] first. We will check labs and Calvin agrees to follow up with our clinic in 2 weeks.  Vitamin B12 Deficiency Janise will work on increasing B12 rich foods in her diet. B12 supplementation was not prescribed today. We will check labs and Niara agrees to follow up with our clinic in 2 weeks.  Hypertension We discussed sodium restriction, working on healthy weight loss, and a regular exercise program as the means to achieve improved blood pressure control. Jannice agreed with this plan and agreed to follow up as directed. We will continue to monitor her blood pressure as well as her progress with the above lifestyle modifications. She will continue her medications and will watch for signs of hypotension as she continues her lifestyle modifications. She will start diet, exercise, and weight loss and we will check labs. Poppi agrees to follow up with our clinic in 2 weeks and we will recheck blood pressure at that time.  Depression Screen Janiyha had a strongly positive depression screening. Depression is commonly associated with obesity and often results in emotional eating behaviors. We will monitor this closely and work on CBT to help improve the non-hunger eating patterns. Referral to Psychology may be required if no improvement is seen as she continues in our clinic.  Obesity Aaryana is currently in the action stage of change and her goal is to continue with weight loss efforts She has agreed to follow the Category 2 plan Sherryll has been instructed to work up to a goal of 150 minutes of combined cardio and strengthening exercise per week for weight loss  and overall health benefits. We discussed the following Behavioral Modification Strategies today: increasing lean protein intake, decreasing simple carbohydrates  and work on meal planning and easy cooking plans  Jelina has agreed to follow up with our clinic in 2 weeks. She was informed of the importance of frequent follow up visits to maximize her success with intensive lifestyle modifications for her multiple health conditions. She was informed we would discuss her lab results at her next visit unless there is a critical issue that needs to be addressed sooner. Enolia agreed to keep her next visit at the agreed upon time to discuss these results.    OBESITY BEHAVIORAL INTERVENTION VISIT  Today's visit was # 1 out of 22.  Starting weight: 248 lbs Starting date: 06/21/17 Today's weight : 248 lbs  Today's date: 06/21/2017 Total lbs lost to date: 0 (Patients must lose 7 lbs in the first 6 months to continue with counseling)   ASK: We discussed the diagnosis of obesity with [name] today and Aletha agreed to give [name] permission to discuss obesity behavioral modification therapy today.  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 She was advised of the need for long term treatment and the importance of lifestyle modifications.  AGREE: Multiple dietary modification options and treatment options were discussed and  [place] agreed to the above obesity treatment plan.   I, Trixie Dredge, am acting as transcriptionist for Dennard Nip, MD   I have reviewed the above documentation for accuracy and completeness, and I agree with the above. -Dennard Nip, MD

## 2017-07-05 ENCOUNTER — Ambulatory Visit (INDEPENDENT_AMBULATORY_CARE_PROVIDER_SITE_OTHER): Payer: Medicare Other | Admitting: Family Medicine

## 2017-07-05 VITALS — BP 142/70 | HR 77 | Temp 97.5°F | Ht 61.0 in | Wt 237.0 lb

## 2017-07-05 DIAGNOSIS — Z6841 Body Mass Index (BMI) 40.0 and over, adult: Secondary | ICD-10-CM | POA: Diagnosis not present

## 2017-07-05 DIAGNOSIS — E782 Mixed hyperlipidemia: Secondary | ICD-10-CM

## 2017-07-05 DIAGNOSIS — E559 Vitamin D deficiency, unspecified: Secondary | ICD-10-CM

## 2017-07-05 DIAGNOSIS — E119 Type 2 diabetes mellitus without complications: Secondary | ICD-10-CM | POA: Diagnosis not present

## 2017-07-05 MED ORDER — VITAMIN D (ERGOCALCIFEROL) 1.25 MG (50000 UNIT) PO CAPS: 50000.0000 [IU] | ORAL_CAPSULE | ORAL | 0 refills | Status: DC

## 2017-07-06 NOTE — Progress Notes (Signed)
Office: [phone]  /  Fax: [phone]   HPI:   Chief Complaint: OBESITY Jasmin [name] is here to discuss her progress with her obesity treatment plan. She is on the Category 2 plan and is following her eating plan approximately 90 % of the time. She states she is walking, doing yard work and bike riding for 20-30 minutes 5 times per week. Jasmin [name] did very well on Category 2 plan. Hunger was controlled and she did well with meal plaaning and prepping. She missed some of her high sugar fruits.  Her weight is 237 lb (107.5 kg) today and has had a weight loss of 11 pounds over a period of 2 weeks since her last visit. She has lost 11 lbs since starting treatment with [name].  Vitamin D Deficiency Jasmin [name] has a diagnosis of vitamin D deficiency. She is on OTC Vit D 2,000, not yet at goal. She denies nausea, vomiting or muscle weakness.  Hyperlipidemia (Mixed) Jasmin [name] has hyperlipidemia and has been trying to improve her cholesterol levels with intensive lifestyle modification including a low saturated fat diet, exercise and weight loss. She is on Lipitor and Zetia, triglycerides elevated and HDL is low. She has been eating more simple carbohydrates than ideal. She denies any chest pain, claudication or myalgias.  Diabetes II Jasmin [name] has a diagnosis of diabetes type II. Jasmin [name] states fasting BGs and 2 hour post prandial mostly range between 100 and 120's over last 2 weeks on her diet prescription. Last A1c in Epic was 7.8, so this is greatly improved. She denies hypoglycemia. She has been working on intensive lifestyle modifications including diet, exercise, and weight loss to help control her blood glucose levels.  ALLERGIES: Allergies  Allergen Reactions  . Ciprofloxacin Hives  . Metronidazole Hives  . Adhesive [Tape] Rash    MEDICATIONS: Current Outpatient Medications on File Prior to Visit  Medication Sig Dispense Refill  . acetaminophen (TYLENOL) 500 MG tablet Take by mouth as needed.      [name] atorvastatin (LIPITOR) 20 MG tablet Take 1 tablet (20 mg total) by mouth daily. 90 tablet 3  . Cholecalciferol (VITAMIN D3) 2000 units TABS Take by mouth. Take one tablet in the AM and one in the PM.    . clopidogrel (PLAVIX) 75 MG tablet Take 1 tablet (75 mg total) by mouth daily. 90 tablet 3  . desonide (DESOWEN) 0.05 % cream Apply topically as needed.     [name] econazole nitrate 1 % cream Apply topically as needed.     . ezetimibe (ZETIA) 10 MG tablet Take 1 tablet (10 mg total) by mouth daily. 90 tablet 3  . fluticasone (FLONASE) 50 MCG/ACT nasal spray     . lisinopril (PRINIVIL,ZESTRIL) 10 MG tablet Take 1 tablet (10 mg total) by mouth daily. 90 tablet 3  . metFORMIN (GLUCOPHAGE) 500 MG tablet Take 1 tablet (500 mg total) by mouth 2 (two) times daily with a meal. 180 tablet 3  . metroNIDAZOLE (METROCREAM) 0.75 % cream Apply topically 2 (two) times daily.    . mirabegron ER (MYRBETRIQ) 50 MG TB24 tablet Take by mouth.    . omega-3 acid ethyl esters (LOVAZA) 1 g capsule Take 2 capsules (2 g total) by mouth 2 (two) times daily. 360 capsule 3  . oxyCODONE-acetaminophen (ROXICET) 5-325 MG/5ML solution Take by mouth every 4 (four) hours as needed for severe pain.    . promethazine-codeine (PHENERGAN WITH CODEINE) 6.25-10 MG/5ML syrup Take 5 mLs by mouth every 6 (six) hours as needed for  cough. 80 mL 0  . PSYLLIUM PO Take by mouth.     No current facility-administered medications on file prior to visit.     PAST MEDICAL HISTORY: Past Medical History:  Diagnosis Date  . Acid reflux disease 03/09/2015  . Aortic stenosis 03/09/2015  . AR (allergic rhinitis) 03/09/2015  . Arthritis   . Back pain   . Controlled type 2 diabetes mellitus without complication, without long-term current use of insulin ([place]) 08/08/2016  . Cough   . 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 Decreased hearing   . Diabetes mellitus without complication ([place])   . Dry mouth   . Easy bruising   . Essential hypertension  08/08/2016  . Heart murmur   . 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 History of CVA (cerebrovascular accident) 08/08/2016   Seen on MRI from 08/2015  . Hyperlipidemia   . Hypertension   . 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 IBS (irritable bowel syndrome) 07/16/2015  . Insomnia 07/16/2015  . Joint pain   . Knee pain   . Left ventricular hypertrophy 07/16/2015  . Leg cramping    right leg  . Mixed hyperlipidemia 08/08/2016  . Morbid obesity ([place]) 11/10/2015  . Nasal discharge   . Nuclear sclerotic cataract of left eye 07/07/2016  . OAB (overactive bladder) 01/20/2016  . OSA (obstructive sleep apnea) 03/09/2015  . Osteoporosis 03/09/2015  . 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 Pulmonary hypertension ([place]) 07/16/2015  . Red eyes   . Right kidney stone   . Shortness of breath on exertion   . Sleep apnea   . Stroke ([place])   . Swelling of both lower extremities   . UTI (urinary tract infection)   . Vitamin D deficiency 03/09/2015    PAST SURGICAL HISTORY: Past Surgical History:  Procedure Laterality Date  . ABDOMINAL HYSTERECTOMY  09/14/2016  . CATARACT EXTRACTION Left   . CATARACT EXTRACTION Left 06/2016  . COLPORRHAPHY N/A 09/14/2016   [facility]  . CYSTOCELE REPAIR    . FRACTURE SURGERY Left 1953   L arm  . FRACTURE SURGERY Left 1985   L ankle  . HERNIA REPAIR  2004  . JOINT REPLACEMENT Right 2012   R TKA  . JOINT REPLACEMENT Left 2017  . KIDNEY STONE SURGERY    . LAPAROSCOPIC ASSISTED VAGINAL HYSTERECTOMY N/A 09/14/2016   [facility]  . R foot/ankle Right 2013 and 2014   plate and screws lateral foot and tendon removal medial foot in 2013, revision in 2014  . RECTOCELE REPAIR    . URETERAL STENT PLACEMENT    . URETEROSCOPY      SOCIAL HISTORY: Social History   Tobacco Use  . Smoking status: Never Smoker  . Smokeless tobacco: Never Used  Substance Use Topics  . Alcohol use: Yes    Alcohol/week: 0.6 oz    Types: 1 Glasses of wine per week    Comment:  couple of glasses per week  . Drug use: No    FAMILY HISTORY: Family History  Problem Relation Age of Onset  . Hyperlipidemia Mother   . Heart disease Mother   . Hypertension Mother   . Stroke Mother   . Thyroid disease Mother   . Hypertension Father   . Heart disease Father     ROS: Review of Systems  Constitutional: Positive for weight loss.  Cardiovascular: Negative for chest pain and claudication.  Gastrointestinal: Negative for nausea and vomiting.  Musculoskeletal: Negative for myalgias.  Negative muscle weakness  Endo/Heme/Allergies:       Negative hypoglycemia    PHYSICAL EXAM: Blood pressure (!) 142/70, pulse 77, temperature (!) 97.5 F (36.4 C), temperature source Oral, height 5\' 1"  (1.549 m), weight 237 lb (107.5 kg), SpO2 98 %. Body mass index is 44.78 kg/m. Physical Exam  Constitutional: She is oriented to person, place, and time. She appears well-developed and well-nourished.  Cardiovascular: Normal rate.  Pulmonary/Chest: Effort normal.  Musculoskeletal: Normal range of motion.  Neurological: She is oriented to person, place, and time.  Skin: Skin is warm and dry.  Psychiatric: She has a normal mood and affect. Her behavior is normal.  Vitals reviewed.   RECENT LABS AND TESTS: BMET    Component Value Date/Time   NA 142 06/21/2017 1057   K 4.3 06/21/2017 1057   CL 103 06/21/2017 1057   CO2 23 06/21/2017 1057   GLUCOSE 111 (H) 06/21/2017 1057   GLUCOSE 109 (H) 05/24/2017 1023   BUN 10 06/21/2017 1057   CREATININE 0.46 (L) 06/21/2017 1057   CALCIUM 9.0 06/21/2017 1057   GFRNONAA 100 06/21/2017 1057   GFRAA 115 06/21/2017 1057   Lab Results  Component Value Date   HGBA1C 7.8 (H) 05/24/2017   HGBA1C 6.7 (H) 12/14/2016   HGBA1C 7.1 (H) 08/08/2016   HGBA1C 6.9 04/18/2016   HGBA1C 7.2 01/18/2016   Lab Results  Component Value Date   INSULIN 17.6 06/21/2017   CBC    Component Value Date/Time   WBC 6.4 05/24/2017 1023   RBC 4.51  05/24/2017 1023   HGB 13.2 05/24/2017 1023   HCT 39.8 05/24/2017 1023   PLT 276.0 05/24/2017 1023   MCV 88.2 05/24/2017 1023   MCHC 33.2 05/24/2017 1023   RDW 16.1 (H) 05/24/2017 1023   Iron/TIBC/Ferritin/ %Sat No results found for: IRON, TIBC, FERRITIN, IRONPCTSAT Lipid Panel     Component Value Date/Time   CHOL 119 06/21/2017 1057   TRIG 178 (H) 06/21/2017 1057   HDL 45 06/21/2017 1057   CHOLHDL 3 12/14/2016 0856   VLDL 51.4 (H) 12/14/2016 0856   LDLCALC 38 06/21/2017 1057   LDLDIRECT 66.0 12/14/2016 0856   Hepatic Function Panel     Component Value Date/Time   PROT 6.5 06/21/2017 1057   ALBUMIN 4.3 06/21/2017 1057   AST 15 06/21/2017 1057   ALT 10 06/21/2017 1057   ALKPHOS 64 06/21/2017 1057   BILITOT 0.7 06/21/2017 1057      Component Value Date/Time   TSH 1.610 06/21/2017 1057  Results for ADAIA, MATTHIES (MRN [id number]) as of 07/06/2017 09:28  Ref. Range 06/21/2017 10:57  Vitamin D, 25-Hydroxy Latest Ref Range: 30.0 - 100.0 ng/mL 36.3    ASSESSMENT AND PLAN: Vitamin D deficiency - Plan: Vitamin D, Ergocalciferol, (DRISDOL) 50000 units CAPS capsule  Mixed hyperlipidemia  Type 2 diabetes mellitus without complication, without long-term current use of insulin (HCC)  Class 3 severe obesity with serious comorbidity and body mass index (BMI) of 40.0 to 44.9 in adult, unspecified obesity type (HCC)  PLAN:  Vitamin D Deficiency Jodeen was informed that low vitamin D levels contributes to fatigue and are associated with obesity, breast, and colon cancer. Bernadene agrees to start prescription Vit D @50 ,000 IU every week #4 with no refills. She will follow up for routine testing of vitamin D, at least 2-3 times per year. She was informed of the risk of over-replacement of vitamin D and agrees to not increase her dose unless she discusses this with  [name] first. Yazmina agrees to follow up with our clinic in 2 to 3 weeks.  Hyperlipidemia (Mixed) Paxtyn was informed of the  American Heart Association Guidelines emphasizing intensive lifestyle modifications as the first line treatment for hyperlipidemia. We discussed many lifestyle modifications today in depth, and Tomiko will continue to work on decreasing saturated fats such as fatty red meat, butter and many fried foods. She will continue medications and will also increase vegetables and lean protein in her diet and continue to work on diet, exercise and weight loss efforts. Burnell agrees to follow up with our clinic in 2 to 3 weeks.  Diabetes II Camber has been given extensive diabetes education by myself today including ideal fasting and post-prandial blood glucose readings, individual ideal Hgb A1c goals and hypoglycemia prevention. We discussed the importance of good blood sugar control to decrease the likelihood of diabetic complications such as nephropathy, neuropathy, limb loss, blindness, coronary artery disease, and death. We discussed the importance of intensive lifestyle modification including diet, exercise and weight loss as the first line treatment for diabetes. Dorreen agrees to continue metformin and diet prescription and she agrees to follow up with our clinic in 2 to 3 weeks.  Obesity Sahiba is currently in the action stage of change. As such, her goal is to continue with weight loss efforts She has agreed to follow the Category 2 plan + 100 calories Natesha has been instructed to work up to a goal of 150 minutes of combined cardio and strengthening exercise per week for weight loss and overall health benefits. We discussed the following Behavioral Modification Strategies today: increasing lean protein intake, decreasing simple carbohydrates, work on meal planning and easy cooking plans, and increase H20 intake   Jizelle has agreed to follow up with our clinic in 2 to 3 weeks. She was informed of the importance of frequent follow up visits to maximize her success with intensive lifestyle  modifications for her multiple health conditions.   OBESITY BEHAVIORAL INTERVENTION VISIT  Today's visit was # 2 out of 22.  Starting weight: 248 lbs Starting date: 06/21/17 Today's weight : 237 lbs  Today's date: 07/05/2017 Total lbs lost to date: 11 (Patients must lose 7 lbs in the first 6 months to continue with counseling)   ASK: We discussed the diagnosis of obesity with [name] today and Destini agreed to give [name] permission to discuss obesity behavioral modification therapy today.  ASSESS: Tijuana has the diagnosis of obesity and her BMI today is 44.8 Richetta is in the action stage of change   ADVISE: Tzipporah was educated on the multiple health risks of obesity as well as the benefit of weight loss to improve her health. She was advised of the need for long term treatment and the importance of lifestyle modifications.  AGREE: Multiple dietary modification options and treatment options were discussed and  Inza agreed to the above obesity treatment plan.  I, Trixie Dredge, am acting as transcriptionist for Dennard Nip, MD  I have reviewed the above documentation for accuracy and completeness, and I agree with the above. -Dennard Nip, MD

## 2017-07-07 ENCOUNTER — Ambulatory Visit: Payer: Medicare Other | Admitting: Cardiology

## 2017-07-11 NOTE — Progress Notes (Signed)
Cardiology Office Note:    Date:  07/12/2017   ID:  Jasmin [name], DOB [birth year], MRN [id number]  PCP:  Jasmin Mclean, MD  Cardiologist:  Jasmin More, MD    Referring MD: Jasmin Mclean, MD    ASSESSMENT:    1. Nonrheumatic aortic valve stenosis   2. Essential hypertension   3. Mixed hyperlipidemia   4. Controlled type 2 diabetes mellitus without complication, without long-term current use of insulin (HCC)    PLAN:    In order of problems listed above:  1. She is at risk for progression recheck echocardiogram.  If the gradient is moderate to severe we will plan on a repeat echocardiogram office follow-up in 6 months and should continue infectious endocarditis prophylaxis 2. Stable blood pressure target continue current treatment ACE inhibitor 3. Stable continue her statin no evidence of toxicity lipids at target 4. Stable continue metformin managed with her PCP   Next appointment: One year   Medication Adjustments/Labs and Tests Ordered: Current medicines are reviewed at length with the patient today.  Concerns regarding medicines are outlined above.  Orders Placed This Encounter  Procedures  . ECHOCARDIOGRAM COMPLETE   No orders of the defined types were placed in this encounter.   Chief Complaint  Patient presents with  . Follow-up  . Cardiac Valve Problem  . Hypertension  . Hyperlipidemia    History of Present Illness:    Jasmin [name] is a 72 y.o. female with a hx of DM, HTN, hyperlipidemia, CVA, obstructive sleep apnea and valvular heart disease last seen 01/03/17.  ASSESSMENT:    01/03/17   1. Aortic valve stenosis, etiology of cardiac valve disease unspecified   2. Essential hypertension   3. Mixed hyperlipidemia    PLAN:     1.         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  She had an echocardiogram performed this year in my previous  practice by another provider and we have requested a copy.  She was told after last echocardiogram she was stable and would be seen in 1 year in follow-up.  She was a never advised interventions for valvular heart disease but does follow endocarditis prophylaxis with bilateral total knee arthroplasty.  She is amoxicillin has an up-to-date prescription.  She is not having angina shortness of breath or syncope but presently is in physical therapy with knee pain and limitations.  She has had multiple surgical procedures performed in the last 6 months.  There is no concern showed by anesthesiology for significant valvular heart disease.  I will see her back in the office in 6 months and plan recheck echocardiogram next August unless she had [name] advanced valvular heart disease when I receive her records. 5. Stable blood pressure target continue current treatment including ACE inhibitor 3.         Stable continue high intensity statin with stroke.  She also continue her current clopidogrel for stroke prophylaxis.  Compliance with diet, lifestyle and medications: Yes  She has mild shortness of breath with physical effort but is engaged in exercise program and only occurs with strenuous activity not severe or limiting.  No chest pain palpitation or syncope she has intermittent dependent edema but overall she is following her diet and exercising has lost weight and feels improved Past Medical History:  Diagnosis Date  . Acid reflux disease 03/09/2015  . Aortic stenosis 03/09/2015  .  AR (allergic rhinitis) 03/09/2015  . Arthritis   . Back pain   . Controlled type 2 diabetes mellitus without complication, without long-term current use of insulin ([place]) 08/08/2016  . Cough   . 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 Decreased hearing   . Diabetes mellitus without complication ([place])   . Dry mouth   . Easy bruising   . Essential hypertension 08/08/2016  . Heart murmur   . History of bilateral knee replacement  12/07/2016  . History of CVA (cerebrovascular accident) 08/08/2016   Seen on MRI from 08/2015  . Hyperlipidemia   . Hypertension   . 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 IBS (irritable bowel syndrome) 07/16/2015  . Insomnia 07/16/2015  . Joint pain   . Knee pain   . Left ventricular hypertrophy 07/16/2015  . Leg cramping    right leg  . Mixed hyperlipidemia 08/08/2016  . Morbid obesity ([place]) 11/10/2015  . Nasal discharge   . Nuclear sclerotic cataract of left eye 07/07/2016  . OAB (overactive bladder) 01/20/2016  . OSA (obstructive sleep apnea) 03/09/2015  . Osteoporosis 03/09/2015  . 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 Pulmonary hypertension ([place]) 07/16/2015  . Red eyes   . Right kidney stone   . Shortness of breath on exertion   . Sleep apnea   . Stroke ([place])   . Swelling of both lower extremities   . UTI (urinary tract infection)   . Vitamin D deficiency 03/09/2015    Past Surgical History:  Procedure Laterality Date  . ABDOMINAL HYSTERECTOMY  09/14/2016  . CATARACT EXTRACTION Left   . CATARACT EXTRACTION Left 06/2016  . COLPORRHAPHY N/A 09/14/2016   [facility]  . CYSTOCELE REPAIR    . FRACTURE SURGERY Left 1953   L arm  . FRACTURE SURGERY Left 1985   L ankle  . HERNIA REPAIR  2004  . JOINT REPLACEMENT Right 2012   R TKA  . JOINT REPLACEMENT Left 2017  . KIDNEY STONE SURGERY    . LAPAROSCOPIC ASSISTED VAGINAL HYSTERECTOMY N/A 09/14/2016   [facility]  . R foot/ankle Right 2013 and 2014   plate and screws lateral foot and tendon removal medial foot in 2013, revision in 2014  . RECTOCELE REPAIR    . URETERAL STENT PLACEMENT    . URETEROSCOPY      Current Medications: Current Meds  Medication Sig  . acetaminophen (TYLENOL) 500 MG tablet Take by mouth as needed.   [name] atorvastatin (LIPITOR) 20 MG tablet Take 1 tablet (20 mg total) by mouth daily.  [name] CALCIUM PO Take 1 tablet by mouth daily.  . Cholecalciferol (VITAMIN D3) 2000 units TABS Take by mouth. Take one tablet in  the AM  . clopidogrel (PLAVIX) 75 MG tablet Take 1 tablet (75 mg total) by mouth daily.  [name] desonide (DESOWEN) 0.05 % cream Apply topically as needed.   [name] econazole nitrate 1 % cream Apply topically as needed.   . ezetimibe (ZETIA) 10 MG tablet Take 1 tablet (10 mg total) by mouth daily.  . fluticasone (FLONASE) 50 MCG/ACT nasal spray Place 1 spray into both nostrils daily as needed for allergies.   [name] lisinopril (PRINIVIL,ZESTRIL) 10 MG tablet Take 1 tablet (10 mg total) by mouth daily.  . metFORMIN (GLUCOPHAGE) 500 MG tablet Take 1 tablet (500 mg total) by mouth 2 (two) times daily with a meal.  . metroNIDAZOLE (METROCREAM) 0.75 % cream Apply topically 2 (two) times daily.  . mirabegron ER (MYRBETRIQ) 50 MG TB24  tablet Take 50 mg by mouth daily.   [name] omega-3 acid ethyl esters (LOVAZA) 1 g capsule Take 2 capsules (2 g total) by mouth 2 (two) times daily.  [name] oxyCODONE-acetaminophen (ROXICET) 5-325 MG/5ML solution Take by mouth every 4 (four) hours as needed for severe pain.  [name] PSYLLIUM PO Take by mouth.  . Vitamin D, Ergocalciferol, (DRISDOL) 50000 units CAPS capsule Take 1 capsule (50,000 Units total) by mouth every 7 (seven) days.     Allergies:   Latex; Ciprofloxacin; Metronidazole; and Adhesive [tape]   Social History   Socioeconomic History  . Marital status: Married    Spouse name: Dayjah Selman  . Number of children: 1  . Years of education: Not on file  . Highest education level: Not on file  Occupational History  . Occupation: Retired Tour manager  . Financial resource strain: Not on file  . Food insecurity:    Worry: Not on file    Inability: Not on file  . Transportation needs:    Medical: Not on file    Non-medical: Not on file  Tobacco Use  . Smoking status: Never Smoker  . Smokeless tobacco: Never Used  Substance and Sexual Activity  . Alcohol use: Yes    Alcohol/week: 0.6 oz    Types: 1 Glasses of wine per week    Comment: couple of glasses per week    . Drug use: No  . Sexual activity: Not Currently  Lifestyle  . Physical activity:    Days per week: Not on file    Minutes per session: Not on file  . Stress: Not on file  Relationships  . Social connections:    Talks on phone: Not on file    Gets together: Not on file    Attends religious service: Not on file    Active member of club or organization: Not on file    Attends meetings of clubs or organizations: Not on file    Relationship status: Not on file  Other Topics Concern  . Not on file  Social History Narrative  . Not on file     Family History: The patient's family history includes Heart disease in her father and mother; Hyperlipidemia in her mother; Hypertension in her father and mother; Stroke in her mother; Thyroid disease in her mother. ROS:   Please see the history of present illness.    All other systems reviewed and are negative.  EKGs/Labs/Other Studies Reviewed:    The following studies were reviewed today:   Recent Labs: 05/24/2017: Hemoglobin 13.2; Platelets 276.0 06/21/2017: ALT 10; BUN 10; Creatinine, Ser 0.46; Potassium 4.3; Sodium 142; TSH 1.610  Recent Lipid Panel    Component Value Date/Time   CHOL 119 06/21/2017 1057   TRIG 178 (H) 06/21/2017 1057   HDL 45 06/21/2017 1057   CHOLHDL 3 12/14/2016 0856   VLDL 51.4 (H) 12/14/2016 0856   LDLCALC 38 06/21/2017 1057   LDLDIRECT 66.0 12/14/2016 0856    Physical Exam:    VS:  BP 114/62 (BP Location: Right Arm, Patient Position: Sitting, Cuff Size: Large)   Pulse 70   Ht 5\' 1"  (1.549 m)   Wt 241 lb 1.9 oz (109.4 kg)   SpO2 97%   BMI 45.56 kg/m     Wt Readings from Last 3 Encounters:  07/12/17 241 lb 1.9 oz (109.4 kg)  07/05/17 237 lb (107.5 kg)  06/21/17 248 lb (112.5 kg)     GEN:  Well nourished, well  developed in no acute distress HEENT: Normal NECK: No JVD; No carotid bruits LYMPHATICS: No lymphadenopathy CARDIAC: RRR 2/6 SEM harsh, no murmurs, rubs, gallops RESPIRATORY:  Clear to  auscultation without rales, wheezing or rhonchi  ABDOMEN: Soft, non-tender, non-distended MUSCULOSKELETAL:  No edema; No deformity  SKIN: Warm and dry NEUROLOGIC:  Alert and oriented x 3 PSYCHIATRIC:  Normal affect    Signed, Jasmin More, MD  07/12/2017 1:36 PM    [facility]

## 2017-07-12 ENCOUNTER — Encounter: Payer: Self-pay | Admitting: Cardiology

## 2017-07-12 ENCOUNTER — Ambulatory Visit: Payer: Medicare Other | Admitting: Cardiology

## 2017-07-12 VITALS — BP 114/62 | HR 70 | Ht 61.0 in | Wt 241.1 lb

## 2017-07-12 DIAGNOSIS — I10 Essential (primary) hypertension: Secondary | ICD-10-CM

## 2017-07-12 DIAGNOSIS — I350 Nonrheumatic aortic (valve) stenosis: Secondary | ICD-10-CM | POA: Diagnosis not present

## 2017-07-12 DIAGNOSIS — E782 Mixed hyperlipidemia: Secondary | ICD-10-CM | POA: Diagnosis not present

## 2017-07-12 DIAGNOSIS — E119 Type 2 diabetes mellitus without complications: Secondary | ICD-10-CM

## 2017-07-12 NOTE — Patient Instructions (Addendum)
Medication Instructions:  Your physician recommends that you continue on your current medications as directed. Please refer to the Current Medication list given to you today.  Labwork: None  Testing/Procedures: Your physician has requested that you have an echocardiogram. Echocardiography is a painless test that uses sound waves to create images of your heart. It provides your doctor with information about the size and shape of your heart and how well your heart's chambers and valves are working. This procedure takes approximately one hour. There are no restrictions for this procedure.  Follow-Up: Your physician wants you to follow-up in: 1 year. You will receive a reminder letter in the mail two months in advance. If you don't receive a letter, please call our office to schedule the follow-up appointment.  Any Other Special Instructions Will Be Listed Below (If Applicable).     If you need a refill on your cardiac medications before your next appointment, please call your pharmacy.    [place] APP for wellness

## 2017-07-27 ENCOUNTER — Ambulatory Visit (INDEPENDENT_AMBULATORY_CARE_PROVIDER_SITE_OTHER): Payer: Medicare Other | Admitting: Family Medicine

## 2017-07-27 VITALS — BP 134/79 | HR 86 | Temp 98.0°F | Ht 61.0 in | Wt 233.0 lb

## 2017-07-27 DIAGNOSIS — E559 Vitamin D deficiency, unspecified: Secondary | ICD-10-CM

## 2017-07-27 DIAGNOSIS — Z6841 Body Mass Index (BMI) 40.0 and over, adult: Secondary | ICD-10-CM

## 2017-07-27 MED ORDER — VITAMIN D (ERGOCALCIFEROL) 1.25 MG (50000 UNIT) PO CAPS: 50000.0000 [IU] | ORAL_CAPSULE | ORAL | 0 refills | Status: DC

## 2017-07-27 NOTE — Progress Notes (Signed)
Office: [phone]  /  Fax: [phone]   HPI:   Chief Complaint: OBESITY Jasmin [name] is here to discuss her progress with her obesity treatment plan. Jasmin [name] is on the Category 2 plan + 100 calories and is following her eating plan approximately 90 % of the time. Jasmin [name] states Jasmin [name] is riding the bicycle and walking for 30 minutes 7 times per week. Jasmin [name] continues to do well with weight loss, Jasmin [name] is walking and biking for exercise. Her family sabotages her with getting her to eat out too often.  Her weight is 233 lb (105.7 kg) today and has had a weight loss of 4 pounds over a period of 3 weeks since her last visit. Jasmin [name] has lost 15 lbs since starting treatment with [name].  Vitamin D Deficiency Jasmin [name] has a diagnosis of vitamin D deficiency. Jasmin [name] is on Vit D prescription, fatigue improving and Jasmin [name] denies nausea, vomiting or muscle weakness.  ALLERGIES: Allergies  Allergen Reactions  . Latex Itching  . Ciprofloxacin Hives  . Metronidazole Hives  . Adhesive [Tape] Rash    MEDICATIONS: Current Outpatient Medications on File Prior to Visit  Medication Sig Dispense Refill  . acetaminophen (TYLENOL) 500 MG tablet Take by mouth as needed.     [name] atorvastatin (LIPITOR) 20 MG tablet Take 1 tablet (20 mg total) by mouth daily. 90 tablet 3  . CALCIUM PO Take 1 tablet by mouth daily.    . Cholecalciferol (VITAMIN D3) 2000 units TABS Take by mouth. Take one tablet in the AM    . clopidogrel (PLAVIX) 75 MG tablet Take 1 tablet (75 mg total) by mouth daily. 90 tablet 3  . desonide (DESOWEN) 0.05 % cream Apply topically as needed.     [name] econazole nitrate 1 % cream Apply topically as needed.     . ezetimibe (ZETIA) 10 MG tablet Take 1 tablet (10 mg total) by mouth daily. 90 tablet 3  . fluticasone (FLONASE) 50 MCG/ACT nasal spray Place 1 spray into both nostrils daily as needed for allergies.     [name] lisinopril (PRINIVIL,ZESTRIL) 10 MG tablet Take 1 tablet (10 mg total) by mouth daily. 90 tablet 3  .  metFORMIN (GLUCOPHAGE) 500 MG tablet Take 1 tablet (500 mg total) by mouth 2 (two) times daily with a meal. 180 tablet 3  . metroNIDAZOLE (METROCREAM) 0.75 % cream Apply topically 2 (two) times daily.    . mirabegron ER (MYRBETRIQ) 50 MG TB24 tablet Take 50 mg by mouth daily.     [name] omega-3 acid ethyl esters (LOVAZA) 1 g capsule Take 2 capsules (2 g total) by mouth 2 (two) times daily. 360 capsule 3  . oxyCODONE-acetaminophen (ROXICET) 5-325 MG/5ML solution Take by mouth every 4 (four) hours as needed for severe pain.    [name] PSYLLIUM PO Take by mouth.    . Vitamin D, Ergocalciferol, (DRISDOL) 50000 units CAPS capsule Take 1 capsule (50,000 Units total) by mouth every 7 (seven) days. 4 capsule 0   No current facility-administered medications on file prior to visit.     PAST MEDICAL HISTORY: Past Medical History:  Diagnosis Date  . Acid reflux disease 03/09/2015  . Aortic stenosis 03/09/2015  . AR (allergic rhinitis) 03/09/2015  . Arthritis   . Back pain   . Controlled type 2 diabetes mellitus without complication, without long-term current use of insulin ([place]) 08/08/2016  . Cough   . 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 Decreased hearing   . Diabetes mellitus without complication ([place])   .  Dry mouth   . Easy bruising   . Essential hypertension 08/08/2016  . Heart murmur   . 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 History of CVA (cerebrovascular accident) 08/08/2016   Seen on MRI from 08/2015  . Hyperlipidemia   . Hypertension   . 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 IBS (irritable bowel syndrome) 07/16/2015  . Insomnia 07/16/2015  . Joint pain   . Knee pain   . Left ventricular hypertrophy 07/16/2015  . Leg cramping    right leg  . Mixed hyperlipidemia 08/08/2016  . Morbid obesity ([employer]) 11/10/2015  . Nasal discharge   . Nuclear sclerotic cataract of left eye 07/07/2016  . OAB (overactive bladder) 01/20/2016  . OSA (obstructive sleep apnea) 03/09/2015  . Osteoporosis  03/09/2015  . 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 Pulmonary hypertension ([place]) 07/16/2015  . Red eyes   . Right kidney stone   . Shortness of breath on exertion   . Sleep apnea   . Stroke ([place])   . Swelling of both lower extremities   . UTI (urinary tract infection)   . Vitamin D deficiency 03/09/2015    PAST SURGICAL HISTORY: Past Surgical History:  Procedure Laterality Date  . ABDOMINAL HYSTERECTOMY  09/14/2016  . CATARACT EXTRACTION Left   . CATARACT EXTRACTION Left 06/2016  . COLPORRHAPHY N/A 09/14/2016   [facility]  . CYSTOCELE REPAIR    . FRACTURE SURGERY Left 1953   L arm  . FRACTURE SURGERY Left 1985   L ankle  . HERNIA REPAIR  2004  . JOINT REPLACEMENT Right 2012   R TKA  . JOINT REPLACEMENT Left 2017  . KIDNEY STONE SURGERY    . LAPAROSCOPIC ASSISTED VAGINAL HYSTERECTOMY N/A 09/14/2016   [facility]  . R foot/ankle Right 2013 and 2014   plate and screws lateral foot and tendon removal medial foot in 2013, revision in 2014  . RECTOCELE REPAIR    . URETERAL STENT PLACEMENT    . URETEROSCOPY      SOCIAL HISTORY: Social History   Tobacco Use  . Smoking status: Never Smoker  . Smokeless tobacco: Never Used  Substance Use Topics  . Alcohol use: Yes    Alcohol/week: 0.6 oz    Types: 1 Glasses of wine per week    Comment: couple of glasses per week  . Drug use: No    FAMILY HISTORY: Family History  Problem Relation Age of Onset  . Hyperlipidemia Mother   . Heart disease Mother   . Hypertension Mother   . Stroke Mother   . Thyroid disease Mother   . Hypertension Father   . Heart disease Father     ROS: Review of Systems  Constitutional: Positive for malaise/fatigue and weight loss.  Gastrointestinal: Negative for nausea and vomiting.  Musculoskeletal:       Negative muscle weakness    PHYSICAL EXAM: Blood pressure 134/79, pulse 86, temperature 98 F (36.7 C), temperature source Oral, height 5\' 1"  (1.549 m), weight 233 lb (105.7 kg), SpO2 99  %. Body mass index is 44.02 kg/m. Physical Exam  Constitutional: Jasmin [name] is oriented to person, place, and time. Jasmin [name] appears well-developed and well-nourished.  Cardiovascular: Normal rate.  Pulmonary/Chest: Effort normal.  Musculoskeletal: Normal range of motion.  Neurological: Jasmin [name] is oriented to person, place, and time.  Skin: Skin is warm and dry.  Psychiatric: Jasmin [name] has a normal mood and affect. Her behavior is normal.  Vitals reviewed.   RECENT LABS AND TESTS:  BMET    Component Value Date/Time   NA 142 06/21/2017 1057   K 4.3 06/21/2017 1057   CL 103 06/21/2017 1057   CO2 23 06/21/2017 1057   GLUCOSE 111 (H) 06/21/2017 1057   GLUCOSE 109 (H) 05/24/2017 1023   BUN 10 06/21/2017 1057   CREATININE 0.46 (L) 06/21/2017 1057   CALCIUM 9.0 06/21/2017 1057   GFRNONAA 100 06/21/2017 1057   GFRAA 115 06/21/2017 1057   Lab Results  Component Value Date   HGBA1C 7.8 (H) 05/24/2017   HGBA1C 6.7 (H) 12/14/2016   HGBA1C 7.1 (H) 08/08/2016   HGBA1C 6.9 04/18/2016   HGBA1C 7.2 01/18/2016   Lab Results  Component Value Date   INSULIN 17.6 06/21/2017   CBC    Component Value Date/Time   WBC 6.4 05/24/2017 1023   RBC 4.51 05/24/2017 1023   HGB 13.2 05/24/2017 1023   HCT 39.8 05/24/2017 1023   PLT 276.0 05/24/2017 1023   MCV 88.2 05/24/2017 1023   MCHC 33.2 05/24/2017 1023   RDW 16.1 (H) 05/24/2017 1023   Iron/TIBC/Ferritin/ %Sat No results found for: IRON, TIBC, FERRITIN, IRONPCTSAT Lipid Panel     Component Value Date/Time   CHOL 119 06/21/2017 1057   TRIG 178 (H) 06/21/2017 1057   HDL 45 06/21/2017 1057   CHOLHDL 3 12/14/2016 0856   VLDL 51.4 (H) 12/14/2016 0856   LDLCALC 38 06/21/2017 1057   LDLDIRECT 66.0 12/14/2016 0856   Hepatic Function Panel     Component Value Date/Time   PROT 6.5 06/21/2017 1057   ALBUMIN 4.3 06/21/2017 1057   AST 15 06/21/2017 1057   ALT 10 06/21/2017 1057   ALKPHOS 64 06/21/2017 1057   BILITOT 0.7 06/21/2017 1057      Component  Value Date/Time   TSH 1.610 06/21/2017 1057  Results for BERTINE, SCHLOTTMAN (MRN [id number]) as of 07/27/2017 11:50  Ref. Range 06/21/2017 10:57  Vitamin D, 25-Hydroxy Latest Ref Range: 30.0 - 100.0 ng/mL 36.3    ASSESSMENT AND PLAN: Vitamin D deficiency - Plan: Vitamin D, Ergocalciferol, (DRISDOL) 50000 units CAPS capsule  Class 3 severe obesity with serious comorbidity and body mass index (BMI) of 40.0 to 44.9 in adult, unspecified obesity type (HCC)  PLAN:  Vitamin D Deficiency Jasmin [name] was informed that low vitamin D levels contributes to fatigue and are associated with obesity, breast, and colon cancer. Jasmin [name] agrees to continue taking prescription Vit D @50 ,000 IU every week #4 and we will refill for 1 month. Jasmin [name] will follow up for routine testing of vitamin D, at least 2-3 times per year. Jasmin [name] was informed of the risk of over-replacement of vitamin D and agrees to not increase her dose unless Jasmin [name] discusses this with [name] first. Jasmin [name] agrees to follow up with our clinic in 2 to 3 weeks.  Obesity Jasmin [name] is currently in the action stage of change. As such, her goal is to continue with weight loss efforts Jasmin [name] has agreed to follow the Category 2 plan + 100 calories Jasmin [name] has been instructed to work up to a goal of 150 minutes of combined cardio and strengthening exercise per week for weight loss and overall health benefits. We discussed the following Behavioral Modification Strategies today: increasing lean protein intake and dealing with family or coworker sabotage   Jasmin [name] has agreed to follow up with our clinic in 2 to 3 weeks. Jasmin [name] was informed of the importance of frequent follow up visits to maximize her success with intensive lifestyle modifications for her multiple  health conditions.   OBESITY BEHAVIORAL INTERVENTION VISIT  Today's visit was # 3 out of 22.  Starting weight: 248 lbs Starting date: 06/21/17 Today's weight : 233 lbs Today's date: 07/27/2017 Total lbs lost to date:  15 (Patients must lose 7 lbs in the first 6 months to continue with counseling)   ASK: We discussed the diagnosis of obesity with Jasmin [name] today and Jasmin [name] agreed to give [name] permission to discuss obesity behavioral modification therapy today.  ASSESS: Jasmin [name] has the diagnosis of obesity and her BMI today is 44.05 Jasmin [name] is in the action stage of change   ADVISE: Jasmin [name] was educated on the multiple health risks of obesity as well as the benefit of weight loss to improve her health. Jasmin [name] was advised of the need for long term treatment and the importance of lifestyle modifications.  AGREE: Multiple dietary modification options and treatment options were discussed and  Jasmin [name] agreed to the above obesity treatment plan.  I, Trixie Dredge, am acting as transcriptionist for Dennard Nip, MD  I have reviewed the above documentation for accuracy and completeness, and I agree with the above. -Dennard Nip, MD

## 2017-07-31 ENCOUNTER — Ambulatory Visit (HOSPITAL_BASED_OUTPATIENT_CLINIC_OR_DEPARTMENT_OTHER)
Admission: RE | Admit: 2017-07-31 | Discharge: 2017-07-31 | Disposition: A | Discharge status: Home / Self Care | Payer: Medicare Other | Source: Ambulatory Visit | Attending: Cardiology | Admitting: Cardiology

## 2017-07-31 DIAGNOSIS — I10 Essential (primary) hypertension: Secondary | ICD-10-CM | POA: Insufficient documentation

## 2017-07-31 DIAGNOSIS — Z8673 Personal history of transient ischemic attack (TIA), and cerebral infarction without residual deficits: Secondary | ICD-10-CM | POA: Insufficient documentation

## 2017-07-31 DIAGNOSIS — I350 Nonrheumatic aortic (valve) stenosis: Secondary | ICD-10-CM

## 2017-07-31 DIAGNOSIS — E785 Hyperlipidemia, unspecified: Secondary | ICD-10-CM | POA: Diagnosis not present

## 2017-07-31 DIAGNOSIS — I352 Nonrheumatic aortic (valve) stenosis with insufficiency: Secondary | ICD-10-CM | POA: Insufficient documentation

## 2017-07-31 NOTE — Progress Notes (Signed)
Echocardiogram 2D Echocardiogram has been performed.  Joelene Millin 07/31/2017, 8:53 AM

## 2017-08-17 ENCOUNTER — Telehealth: Payer: Self-pay | Admitting: Family Medicine

## 2017-08-17 ENCOUNTER — Ambulatory Visit (INDEPENDENT_AMBULATORY_CARE_PROVIDER_SITE_OTHER): Payer: Medicare Other | Admitting: Family Medicine

## 2017-08-17 ENCOUNTER — Other Ambulatory Visit: Payer: Self-pay

## 2017-08-17 VITALS — BP 133/72 | HR 67 | Temp 97.8°F | Ht 61.0 in | Wt 229.0 lb

## 2017-08-17 DIAGNOSIS — Z6841 Body Mass Index (BMI) 40.0 and over, adult: Secondary | ICD-10-CM

## 2017-08-17 DIAGNOSIS — E559 Vitamin D deficiency, unspecified: Secondary | ICD-10-CM

## 2017-08-17 DIAGNOSIS — E782 Mixed hyperlipidemia: Secondary | ICD-10-CM

## 2017-08-17 DIAGNOSIS — Z8673 Personal history of transient ischemic attack (TIA), and cerebral infarction without residual deficits: Secondary | ICD-10-CM

## 2017-08-17 DIAGNOSIS — E66813 Obesity, class 3: Secondary | ICD-10-CM

## 2017-08-17 DIAGNOSIS — E119 Type 2 diabetes mellitus without complications: Secondary | ICD-10-CM

## 2017-08-17 MED ORDER — METFORMIN HCL 500 MG PO TABS: 500.0000 mg | ORAL_TABLET | Freq: Two times a day (BID) | ORAL | 0 refills | Status: DC

## 2017-08-17 MED ORDER — CLOPIDOGREL BISULFATE 75 MG PO TABS: 75.0000 mg | ORAL_TABLET | Freq: Every day | ORAL | 0 refills | Status: DC

## 2017-08-17 MED ORDER — ATORVASTATIN CALCIUM 20 MG PO TABS: 20.0000 mg | ORAL_TABLET | Freq: Every day | ORAL | 0 refills | Status: DC

## 2017-08-17 MED ORDER — VITAMIN D (ERGOCALCIFEROL) 1.25 MG (50000 UNIT) PO CAPS: 50000.0000 [IU] | ORAL_CAPSULE | ORAL | 0 refills | Status: DC

## 2017-08-17 NOTE — Progress Notes (Signed)
Office: [phone]  /  Fax: [phone]   HPI:   Chief Complaint: OBESITY Jasmin [name] is here to discuss her progress with her obesity treatment plan. She is on the Category 2 plan +100 calories and is following her eating plan approximately 75 % of the time. She states she is walking 20 minutes 7 times per week. Jasmin [name] continues to do very well with weight loss on the category 2 +100 calories plan. Hunger is controlled and she is getting good support at home. Her weight is 229 lb (103.9 kg) today and has had a weight loss of 4 pounds over a period of 3 weeks since her last visit. She has lost 19 lbs since starting treatment with [name].  Vitamin D deficiency Jasmin [name] has a diagnosis of vitamin D deficiency. Jasmin [name] is stable on vit D, but she is not yet at goal. She denies nausea, vomiting or muscle weakness.  Diabetes II Jasmin [name] has a diagnosis of diabetes type II. Jasmin [name] states fasting BGs range between 111 and 118 and are greatly improved with diet. She is stable on metformin. Jasmin [name] denies nausea, vomiting or any hypoglycemic episodes. Last A1c was at 7.8 She has been working on intensive lifestyle modifications including diet, exercise, and weight loss to help control her blood glucose levels.  ALLERGIES: Allergies  Allergen Reactions  . Latex Itching  . Ciprofloxacin Hives  . Metronidazole Hives  . Adhesive [Tape] Rash    MEDICATIONS: Current Outpatient Medications on File Prior to Visit  Medication Sig Dispense Refill  . acetaminophen (TYLENOL) 500 MG tablet Take by mouth as needed.     [name] atorvastatin (LIPITOR) 20 MG tablet Take 1 tablet (20 mg total) by mouth daily. 90 tablet 3  . CALCIUM PO Take 1 tablet by mouth daily.    . Cholecalciferol (VITAMIN D3) 2000 units TABS Take by mouth. Take one tablet in the AM    . clopidogrel (PLAVIX) 75 MG tablet Take 1 tablet (75 mg total) by mouth daily. 90 tablet 3  . desonide (DESOWEN) 0.05 % cream Apply topically as needed.     [name]  econazole nitrate 1 % cream Apply topically as needed.     . ezetimibe (ZETIA) 10 MG tablet Take 1 tablet (10 mg total) by mouth daily. 90 tablet 3  . fluticasone (FLONASE) 50 MCG/ACT nasal spray Place 1 spray into both nostrils daily as needed for allergies.     [name] lisinopril (PRINIVIL,ZESTRIL) 10 MG tablet Take 1 tablet (10 mg total) by mouth daily. 90 tablet 3  . metFORMIN (GLUCOPHAGE) 500 MG tablet Take 1 tablet (500 mg total) by mouth 2 (two) times daily with a meal. 180 tablet 3  . metroNIDAZOLE (METROCREAM) 0.75 % cream Apply topically 2 (two) times daily.    . mirabegron ER (MYRBETRIQ) 50 MG TB24 tablet Take 50 mg by mouth daily.     [name] omega-3 acid ethyl esters (LOVAZA) 1 g capsule Take 2 capsules (2 g total) by mouth 2 (two) times daily. 360 capsule 3  . oxyCODONE-acetaminophen (ROXICET) 5-325 MG/5ML solution Take by mouth every 4 (four) hours as needed for severe pain.    [name] PSYLLIUM PO Take by mouth.     No current facility-administered medications on file prior to visit.     PAST MEDICAL HISTORY: Past Medical History:  Diagnosis Date  . Acid reflux disease 03/09/2015  . Aortic stenosis 03/09/2015  . AR (allergic rhinitis) 03/09/2015  . Arthritis   . Back pain   . Controlled  type 2 diabetes mellitus without complication, without long-term current use of insulin ([place]) 08/08/2016  . Cough   . 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 Decreased hearing   . Diabetes mellitus without complication ([place])   . Dry mouth   . Easy bruising   . Essential hypertension 08/08/2016  . Heart murmur   . 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 History of CVA (cerebrovascular accident) 08/08/2016   Seen on MRI from 08/2015  . Hyperlipidemia   . Hypertension   . 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 IBS (irritable bowel syndrome) 07/16/2015  . Insomnia 07/16/2015  . Joint pain   . Knee pain   . Left ventricular hypertrophy 07/16/2015  . Leg cramping    right leg  . Mixed hyperlipidemia  08/08/2016  . Morbid obesity ([place]) 11/10/2015  . Nasal discharge   . Nuclear sclerotic cataract of left eye 07/07/2016  . OAB (overactive bladder) 01/20/2016  . OSA (obstructive sleep apnea) 03/09/2015  . Osteoporosis 03/09/2015  . 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 Pulmonary hypertension ([place]) 07/16/2015  . Red eyes   . Right kidney stone   . Shortness of breath on exertion   . Sleep apnea   . Stroke ([place])   . Swelling of both lower extremities   . UTI (urinary tract infection)   . Vitamin D deficiency 03/09/2015    PAST SURGICAL HISTORY: Past Surgical History:  Procedure Laterality Date  . ABDOMINAL HYSTERECTOMY  09/14/2016  . CATARACT EXTRACTION Left   . CATARACT EXTRACTION Left 06/2016  . COLPORRHAPHY N/A 09/14/2016   [facility]  . CYSTOCELE REPAIR    . FRACTURE SURGERY Left 1953   L arm  . FRACTURE SURGERY Left 1985   L ankle  . HERNIA REPAIR  2004  . JOINT REPLACEMENT Right 2012   R TKA  . JOINT REPLACEMENT Left 2017  . KIDNEY STONE SURGERY    . LAPAROSCOPIC ASSISTED VAGINAL HYSTERECTOMY N/A 09/14/2016   [facility]  . R foot/ankle Right 2013 and 2014   plate and screws lateral foot and tendon removal medial foot in 2013, revision in 2014  . RECTOCELE REPAIR    . URETERAL STENT PLACEMENT    . URETEROSCOPY      SOCIAL HISTORY: Social History   Tobacco Use  . Smoking status: Never Smoker  . Smokeless tobacco: Never Used  Substance Use Topics  . Alcohol use: Yes    Alcohol/week: 0.6 oz    Types: 1 Glasses of wine per week    Comment: couple of glasses per week  . Drug use: No    FAMILY HISTORY: Family History  Problem Relation Age of Onset  . Hyperlipidemia Mother   . Heart disease Mother   . Hypertension Mother   . Stroke Mother   . Thyroid disease Mother   . Hypertension Father   . Heart disease Father     ROS: Review of Systems  Constitutional: Positive for weight loss.  Gastrointestinal: Negative for nausea and vomiting.  Musculoskeletal:        Negative for muscle weakness  Endo/Heme/Allergies:       Negative for hypoglycemia    PHYSICAL EXAM: Blood pressure 133/72, pulse 67, temperature 97.8 F (36.6 C), temperature source Oral, height 5\' 1"  (1.549 m), weight 229 lb (103.9 kg), SpO2 99 %. Body mass index is 43.27 kg/m. Physical Exam  Constitutional: She is oriented to person, place, and time. She appears well-developed and well-nourished.  Cardiovascular: Normal rate.  Pulmonary/Chest: Effort normal.  Musculoskeletal: Normal range of motion.  Neurological: She is oriented to person, place, and time.  Skin: Skin is warm and dry.  Psychiatric: She has a normal mood and affect. Her behavior is normal.  Vitals reviewed.   RECENT LABS AND TESTS: BMET    Component Value Date/Time   NA 142 06/21/2017 1057   K 4.3 06/21/2017 1057   CL 103 06/21/2017 1057   CO2 23 06/21/2017 1057   GLUCOSE 111 (H) 06/21/2017 1057   GLUCOSE 109 (H) 05/24/2017 1023   BUN 10 06/21/2017 1057   CREATININE 0.46 (L) 06/21/2017 1057   CALCIUM 9.0 06/21/2017 1057   GFRNONAA 100 06/21/2017 1057   GFRAA 115 06/21/2017 1057   Lab Results  Component Value Date   HGBA1C 7.8 (H) 05/24/2017   HGBA1C 6.7 (H) 12/14/2016   HGBA1C 7.1 (H) 08/08/2016   HGBA1C 6.9 04/18/2016   HGBA1C 7.2 01/18/2016   Lab Results  Component Value Date   INSULIN 17.6 06/21/2017   CBC    Component Value Date/Time   WBC 6.4 05/24/2017 1023   RBC 4.51 05/24/2017 1023   HGB 13.2 05/24/2017 1023   HCT 39.8 05/24/2017 1023   PLT 276.0 05/24/2017 1023   MCV 88.2 05/24/2017 1023   MCHC 33.2 05/24/2017 1023   RDW 16.1 (H) 05/24/2017 1023   Iron/TIBC/Ferritin/ %Sat No results found for: IRON, TIBC, FERRITIN, IRONPCTSAT Lipid Panel     Component Value Date/Time   CHOL 119 06/21/2017 1057   TRIG 178 (H) 06/21/2017 1057   HDL 45 06/21/2017 1057   CHOLHDL 3 12/14/2016 0856   VLDL 51.4 (H) 12/14/2016 0856   LDLCALC 38 06/21/2017 1057   LDLDIRECT 66.0 12/14/2016  0856   Hepatic Function Panel     Component Value Date/Time   PROT 6.5 06/21/2017 1057   ALBUMIN 4.3 06/21/2017 1057   AST 15 06/21/2017 1057   ALT 10 06/21/2017 1057   ALKPHOS 64 06/21/2017 1057   BILITOT 0.7 06/21/2017 1057      Component Value Date/Time   TSH 1.610 06/21/2017 1057   Results for ALEANNA, MENGE (MRN [id number]) as of 08/17/2017 13:09  Ref. Range 06/21/2017 10:57  Vitamin D, 25-Hydroxy Latest Ref Range: 30.0 - 100.0 ng/mL 36.3   ASSESSMENT AND PLAN: Type 2 diabetes mellitus without complication, without long-term current use of insulin (HCC)  Vitamin D deficiency - Plan: Vitamin D, Ergocalciferol, (DRISDOL) 50000 units CAPS capsule  Class 3 severe obesity with serious comorbidity and body mass index (BMI) of 40.0 to 44.9 in adult, unspecified obesity type (HCC)  PLAN:  Vitamin D Deficiency Jasmin [name] was informed that low vitamin D levels contributes to fatigue and are associated with obesity, breast, and colon cancer. She agrees to continue to take prescription Vit D @50 ,000 IU every week #4 with no refills and will follow up for routine testing of vitamin D, at least 2-3 times per year. She was informed of the risk of over-replacement of vitamin D and agrees to not increase her dose unless she discusses this with [name] first. Jasmin [name] agrees to follow up as directed.  Diabetes II Jasmin [name] has been given extensive diabetes education by myself today including ideal fasting and post-prandial blood glucose readings, individual ideal Hgb A1c goals and hypoglycemia prevention. We discussed the importance of good blood sugar control to decrease the likelihood of diabetic complications such as nephropathy, neuropathy, limb loss, blindness, coronary artery disease, and death. We discussed the importance of intensive lifestyle modification including  diet, exercise and weight loss as the first line treatment for diabetes. Iyani agrees to continue diet, exercise and metformin 500 mg 2  times daily. Jasmin [name] will follow up at the agreed upon time.  Obesity Jasmin [name] is currently in the action stage of change. As such, her goal is to continue with weight loss efforts She has agreed to follow the Category 2 plan +100 calories with melon fruit options Jasmin [name] has been instructed to work up to a goal of 150 minutes of combined cardio and strengthening exercise per week for weight loss and overall health benefits. We discussed the following Behavioral Modification Strategies today: work on meal planning and easy cooking plans and holiday eating strategies   Jasmin [name] has agreed to follow up with our clinic in 2 to 3 weeks. She was informed of the importance of frequent follow up visits to maximize her success with intensive lifestyle modifications for her multiple health conditions.   OBESITY BEHAVIORAL INTERVENTION VISIT  Today's visit was # 4 out of 22.  Starting weight: 248 lbs Starting date: 06/21/17 Today's weight : 229 lbs  Today's date: 08/17/2017 Total lbs lost to date: 37 (Patients must lose 7 lbs in the first 6 months to continue with counseling)   ASK: We discussed the diagnosis of obesity with Jasmin [name] today and Jasmin [name] agreed to give [name] permission to discuss obesity behavioral modification therapy today.  ASSESS: Jasmin [name] has the diagnosis of obesity and her BMI today is 43.29 Jasmin [name] is in the action stage of change   ADVISE: Jasmin [name] was educated on the multiple health risks of obesity as well as the benefit of weight loss to improve her health. She was advised of the need for long term treatment and the importance of lifestyle modifications.  AGREE: Multiple dietary modification options and treatment options were discussed and  Jasmin [name] agreed to the above obesity treatment plan.  I, Doreene Nest, am acting as transcriptionist for Dennard Nip, MD  I have reviewed the above documentation for accuracy and completeness, and I agree with the above.  -Dennard Nip, MD

## 2017-08-17 NOTE — Telephone Encounter (Signed)
Received refill request for patients metformin, clopidogrel, and atorvastatin. Refill sent to pharmacy.

## 2017-08-17 NOTE — Telephone Encounter (Signed)
Copied from [place] [phone]. Topic: Quick Communication - See Telephone Encounter >> Aug 17, 2017  1:08 PM Hewitt Shorts wrote: Pt is needing a refill on her lipitor plavix and metformin   [facility]   Best number [phone]

## 2017-08-18 NOTE — Telephone Encounter (Signed)
Pt states she has picked up refills of requested medications this morning.

## 2017-09-07 ENCOUNTER — Ambulatory Visit (INDEPENDENT_AMBULATORY_CARE_PROVIDER_SITE_OTHER): Payer: Medicare Other | Admitting: Family Medicine

## 2017-09-07 VITALS — BP 134/79 | HR 76 | Temp 97.8°F | Ht 61.0 in | Wt 225.0 lb

## 2017-09-07 DIAGNOSIS — E559 Vitamin D deficiency, unspecified: Secondary | ICD-10-CM

## 2017-09-07 DIAGNOSIS — Z6841 Body Mass Index (BMI) 40.0 and over, adult: Secondary | ICD-10-CM

## 2017-09-07 DIAGNOSIS — E119 Type 2 diabetes mellitus without complications: Secondary | ICD-10-CM | POA: Diagnosis not present

## 2017-09-07 MED ORDER — VITAMIN D (ERGOCALCIFEROL) 1.25 MG (50000 UNIT) PO CAPS: 50000.0000 [IU] | ORAL_CAPSULE | ORAL | 0 refills | Status: DC

## 2017-09-11 NOTE — Progress Notes (Signed)
Office: [phone]  /  Fax: [phone]   HPI:   Chief Complaint: OBESITY Jasmin [name] is here to discuss her progress with her obesity treatment plan. She is on the Category 2 plan +100 calories and is following her eating plan approximately 60 to 70 % of the time. She states she is walking and doing low impact exercised for  60 minutes 7 times per week. Jasmin [name] continues to do well with weight loss on her category 2 plan and she has increased exercise. Hunger is controlled and patient denies feeling deprived. Her weight is 225 lb (102.1 kg) today and has had a weight loss of 4 pounds over a period of 3 weeks since her last visit. She has lost 23 lbs since starting treatment with [name].  Vitamin D deficiency Jasmin [name] has a diagnosis of vitamin D deficiency. She is stable on vit D. Fatigue is improving and she denies nausea, vomiting or muscle weakness.  Diabetes II Jasmin [name] has a diagnosis of diabetes type II. Jasmin [name] states fasting BGs range between 100 and 115 and 2 hour post prandial BGs range mostly between  100 and 110. Jasmin [name] is stable on Metformin and she is doing very well on her diet prescription. Jasmin [name] denies any hypoglycemic episodes. Last A1c was at 7.8 She has been working on intensive lifestyle modifications including diet, exercise, and weight loss to help control her blood glucose levels.  ALLERGIES: Allergies  Allergen Reactions  . Latex Itching  . Ciprofloxacin Hives  . Metronidazole Hives  . Adhesive [Tape] Rash    MEDICATIONS: Current Outpatient Medications on File Prior to Visit  Medication Sig Dispense Refill  . acetaminophen (TYLENOL) 500 MG tablet Take by mouth as needed.     [name] atorvastatin (LIPITOR) 20 MG tablet Take 1 tablet (20 mg total) by mouth daily. 90 tablet 0  . CALCIUM PO Take 1 tablet by mouth daily.    . Cholecalciferol (VITAMIN D3) 2000 units TABS Take by mouth. Take one tablet in the AM    . clopidogrel (PLAVIX) 75 MG tablet Take 1 tablet (75 mg  total) by mouth daily. 90 tablet 0  . desonide (DESOWEN) 0.05 % cream Apply topically as needed.     [name] econazole nitrate 1 % cream Apply topically as needed.     . ezetimibe (ZETIA) 10 MG tablet Take 1 tablet (10 mg total) by mouth daily. 90 tablet 3  . fluticasone (FLONASE) 50 MCG/ACT nasal spray Place 1 spray into both nostrils daily as needed for allergies.     [name] lisinopril (PRINIVIL,ZESTRIL) 10 MG tablet Take 1 tablet (10 mg total) by mouth daily. 90 tablet 3  . metFORMIN (GLUCOPHAGE) 500 MG tablet Take 1 tablet (500 mg total) by mouth 2 (two) times daily with a meal. 180 tablet 0  . metroNIDAZOLE (METROCREAM) 0.75 % cream Apply topically 2 (two) times daily.    . mirabegron ER (MYRBETRIQ) 50 MG TB24 tablet Take 50 mg by mouth daily.     [name] omega-3 acid ethyl esters (LOVAZA) 1 g capsule Take 2 capsules (2 g total) by mouth 2 (two) times daily. 360 capsule 3  . oxyCODONE-acetaminophen (ROXICET) 5-325 MG/5ML solution Take by mouth every 4 (four) hours as needed for severe pain.    [name] PSYLLIUM PO Take by mouth.     No current facility-administered medications on file prior to visit.     PAST MEDICAL HISTORY: Past Medical History:  Diagnosis Date  . Acid reflux disease 03/09/2015  . Aortic stenosis  03/09/2015  . AR (allergic rhinitis) 03/09/2015  . Arthritis   . Back pain   . Controlled type 2 diabetes mellitus without complication, without long-term current use of insulin ([place]) 08/08/2016  . Cough   . 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 Decreased hearing   . Diabetes mellitus without complication ([place])   . Dry mouth   . Easy bruising   . Essential hypertension 08/08/2016  . Heart murmur   . 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 History of CVA (cerebrovascular accident) 08/08/2016   Seen on MRI from 08/2015  . Hyperlipidemia   . Hypertension   . 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 IBS (irritable bowel syndrome) 07/16/2015  . Insomnia 07/16/2015  . Joint pain   . Knee  pain   . Left ventricular hypertrophy 07/16/2015  . Leg cramping    right leg  . Mixed hyperlipidemia 08/08/2016  . Morbid obesity ([place]) 11/10/2015  . Nasal discharge   . Nuclear sclerotic cataract of left eye 07/07/2016  . OAB (overactive bladder) 01/20/2016  . OSA (obstructive sleep apnea) 03/09/2015  . Osteoporosis 03/09/2015  . 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 Pulmonary hypertension ([place]) 07/16/2015  . Red eyes   . Right kidney stone   . Shortness of breath on exertion   . Sleep apnea   . Stroke ([place])   . Swelling of both lower extremities   . UTI (urinary tract infection)   . Vitamin D deficiency 03/09/2015    PAST SURGICAL HISTORY: Past Surgical History:  Procedure Laterality Date  . ABDOMINAL HYSTERECTOMY  09/14/2016  . CATARACT EXTRACTION Left   . CATARACT EXTRACTION Left 06/2016  . COLPORRHAPHY N/A 09/14/2016   [facility]  . CYSTOCELE REPAIR    . FRACTURE SURGERY Left 1953   L arm  . FRACTURE SURGERY Left 1985   L ankle  . HERNIA REPAIR  2004  . JOINT REPLACEMENT Right 2012   R TKA  . JOINT REPLACEMENT Left 2017  . KIDNEY STONE SURGERY    . LAPAROSCOPIC ASSISTED VAGINAL HYSTERECTOMY N/A 09/14/2016   [facility]  . R foot/ankle Right 2013 and 2014   plate and screws lateral foot and tendon removal medial foot in 2013, revision in 2014  . RECTOCELE REPAIR    . URETERAL STENT PLACEMENT    . URETEROSCOPY      SOCIAL HISTORY: Social History   Tobacco Use  . Smoking status: Never Smoker  . Smokeless tobacco: Never Used  Substance Use Topics  . Alcohol use: Yes    Alcohol/week: 0.6 oz    Types: 1 Glasses of wine per week    Comment: couple of glasses per week  . Drug use: No    FAMILY HISTORY: Family History  Problem Relation Age of Onset  . Hyperlipidemia Mother   . Heart disease Mother   . Hypertension Mother   . Stroke Mother   . Thyroid disease Mother   . Hypertension Father   . Heart disease Father     ROS: Review of Systems    Constitutional: Positive for malaise/fatigue and weight loss.  Gastrointestinal: Negative for nausea and vomiting.  Musculoskeletal:       Negative for muscle weakness  Endo/Heme/Allergies:       Negative for hypoglycemia    PHYSICAL EXAM: Blood pressure 134/79, pulse 76, temperature 97.8 F (36.6 C), temperature source Oral, height 5\' 1"  (1.549 m), weight 225 lb (102.1 kg), SpO2 98 %. Body mass index is 42.51  kg/m. Physical Exam  Constitutional: She is oriented to person, place, and time. She appears well-developed and well-nourished.  Cardiovascular: Normal rate.  Pulmonary/Chest: Effort normal.  Musculoskeletal: Normal range of motion.  Neurological: She is oriented to person, place, and time.  Skin: Skin is warm and dry.  Psychiatric: She has a normal mood and affect. Her behavior is normal.  Vitals reviewed.   RECENT LABS AND TESTS: BMET    Component Value Date/Time   NA 142 06/21/2017 1057   K 4.3 06/21/2017 1057   CL 103 06/21/2017 1057   CO2 23 06/21/2017 1057   GLUCOSE 111 (H) 06/21/2017 1057   GLUCOSE 109 (H) 05/24/2017 1023   BUN 10 06/21/2017 1057   CREATININE 0.46 (L) 06/21/2017 1057   CALCIUM 9.0 06/21/2017 1057   GFRNONAA 100 06/21/2017 1057   GFRAA 115 06/21/2017 1057   Lab Results  Component Value Date   HGBA1C 7.8 (H) 05/24/2017   HGBA1C 6.7 (H) 12/14/2016   HGBA1C 7.1 (H) 08/08/2016   HGBA1C 6.9 04/18/2016   HGBA1C 7.2 01/18/2016   Lab Results  Component Value Date   INSULIN 17.6 06/21/2017   CBC    Component Value Date/Time   WBC 6.4 05/24/2017 1023   RBC 4.51 05/24/2017 1023   HGB 13.2 05/24/2017 1023   HCT 39.8 05/24/2017 1023   PLT 276.0 05/24/2017 1023   MCV 88.2 05/24/2017 1023   MCHC 33.2 05/24/2017 1023   RDW 16.1 (H) 05/24/2017 1023   Iron/TIBC/Ferritin/ %Sat No results found for: IRON, TIBC, FERRITIN, IRONPCTSAT Lipid Panel     Component Value Date/Time   CHOL 119 06/21/2017 1057   TRIG 178 (H) 06/21/2017 1057   HDL  45 06/21/2017 1057   CHOLHDL 3 12/14/2016 0856   VLDL 51.4 (H) 12/14/2016 0856   LDLCALC 38 06/21/2017 1057   LDLDIRECT 66.0 12/14/2016 0856   Hepatic Function Panel     Component Value Date/Time   PROT 6.5 06/21/2017 1057   ALBUMIN 4.3 06/21/2017 1057   AST 15 06/21/2017 1057   ALT 10 06/21/2017 1057   ALKPHOS 64 06/21/2017 1057   BILITOT 0.7 06/21/2017 1057      Component Value Date/Time   TSH 1.610 06/21/2017 1057   Results for EARTHA, VONBEHREN (MRN [id number]) as of 09/11/2017 07:59  Ref. Range 06/21/2017 10:57  Vitamin D, 25-Hydroxy Latest Ref Range: 30.0 - 100.0 ng/mL 36.3   ASSESSMENT AND PLAN: Vitamin D deficiency - Plan: Vitamin D, Ergocalciferol, (DRISDOL) 50000 units CAPS capsule  Type 2 diabetes mellitus without complication, without long-term current use of insulin ([facility])  Class 3 severe obesity with serious comorbidity and body mass index (BMI) of 40.0 to 44.9 in adult, unspecified obesity type ([place])  PLAN:  Vitamin D Deficiency Jasmin [name] was informed that low vitamin D levels contributes to fatigue and are associated with obesity, breast, and colon cancer. She agrees to continue to take prescription Vit D @50 ,000 IU every week #4 with no refills and will follow up for routine testing of vitamin D, at least 2-3 times per year. She was informed of the risk of over-replacement of vitamin D and agrees to not increase her dose unless she discusses this with [name] first. Jasmin [name] agrees to follow up as directed.  Diabetes II Jasmin [name] has been given extensive diabetes education by myself today including ideal fasting and post-prandial blood glucose readings, individual ideal Hgb A1c goals and hypoglycemia prevention. We discussed the importance of good blood sugar control to decrease the likelihood of  diabetic complications such as nephropathy, neuropathy, limb loss, blindness, coronary artery disease, and death. We discussed the importance of intensive lifestyle modification  including diet, exercise and weight loss as the first line treatment for diabetes. Jasmin [name] agrees to continue metformin and diet. We will recheck labs at the next visit and Jasmin [name] agrees to follow up at the agreed upon time.  Obesity Jasmin [name] is currently in the action stage of change. As such, her goal is to continue with weight loss efforts She has agreed to follow the Category 2 plan Jasmin [name] has been instructed to work up to a goal of 150 minutes of combined cardio and strengthening exercise per week for weight loss and overall health benefits. We discussed the following Behavioral Modification Strategies today: increasing lean protein intake, decreasing simple carbohydrates  and work on meal planning and easy cooking plans  Jasmin [name] has agreed to follow up with our clinic in 2 to 3 weeks fasting. She was informed of the importance of frequent follow up visits to maximize her success with intensive lifestyle modifications for her multiple health conditions.   OBESITY BEHAVIORAL INTERVENTION VISIT  Today's visit was # 5 out of 22.  Starting weight: 248 lbs Starting date: 06/21/17 Today's weight : 225 lbs  Today's date: 09/07/2017 Total lbs lost to date: 28    ASK: We discussed the diagnosis of obesity with Jasmin [name] today and Jasmin [name] agreed to give [name] permission to discuss obesity behavioral modification therapy today.  ASSESS: Jasmin [name] has the diagnosis of obesity and her BMI today is 42.54 Jasmin [name] is in the action stage of change   ADVISE: Jasmin [name] was educated on the multiple health risks of obesity as well as the benefit of weight loss to improve her health. She was advised of the need for long term treatment and the importance of lifestyle modifications.  AGREE: Multiple dietary modification options and treatment options were discussed and  Jasmin [name] agreed to the above obesity treatment plan.  I, Doreene Nest, am acting as transcriptionist for Dennard Nip, MD  I have  reviewed the above documentation for accuracy and completeness, and I agree with the above. -Dennard Nip, MD

## 2017-09-20 ENCOUNTER — Other Ambulatory Visit: Payer: Self-pay | Admitting: Family Medicine

## 2017-09-20 DIAGNOSIS — Z1231 Encounter for screening mammogram for malignant neoplasm of breast: Secondary | ICD-10-CM

## 2017-09-28 ENCOUNTER — Ambulatory Visit (INDEPENDENT_AMBULATORY_CARE_PROVIDER_SITE_OTHER): Payer: Medicare Other | Admitting: Physician Assistant

## 2017-09-28 VITALS — BP 132/80 | HR 74 | Ht 61.0 in | Wt 226.0 lb

## 2017-09-28 DIAGNOSIS — E559 Vitamin D deficiency, unspecified: Secondary | ICD-10-CM | POA: Diagnosis not present

## 2017-09-28 DIAGNOSIS — Z6841 Body Mass Index (BMI) 40.0 and over, adult: Secondary | ICD-10-CM | POA: Diagnosis not present

## 2017-09-28 DIAGNOSIS — E119 Type 2 diabetes mellitus without complications: Secondary | ICD-10-CM

## 2017-09-28 MED ORDER — VITAMIN D (ERGOCALCIFEROL) 1.25 MG (50000 UNIT) PO CAPS: 50000.0000 [IU] | ORAL_CAPSULE | ORAL | 0 refills | Status: DC

## 2017-09-28 NOTE — Progress Notes (Signed)
Office: [phone]  /  Fax: [phone]   HPI:   Chief Complaint: OBESITY Jasmin [name] is here to discuss her progress with her obesity treatment plan. She is on the Category 2 plan and is following her eating plan approximately 50 % of the time. She states she is doing water aerobics 60 minutes 2 times per week and walking 8,000 steps 7 times per week. Jasmin [name] did well with maintenance over the last two weeks while on vacation. Jasmin [name] notes eating out more and she  practices smarter food choices and portion control. Her weight is 226 lb (102.5 kg) today and has had a weight gain of 1 pound over a period of 3 weeks since her last visit. She has lost 22 lbs since starting treatment with [name].  Diabetes II Jasmin [name] has a diagnosis of diabetes type II. Jasmin [name]'s blood sugar log shows fasting BGs range between 100 and 115 and she denies any hypoglycemic episodes. Last A1c was at 7.8 She has been working on intensive lifestyle modifications including diet, exercise, and weight loss to help control her blood glucose levels. Jasmin [name] denies nausea, vomiting or diarrhea.  Vitamin D deficiency Jasmin [name] has a diagnosis of vitamin D deficiency. She is on prescription vit D and denies nausea, vomiting or muscle weakness.  Hyperlipidemia Jasmin [name] has hyperlipidemia and is on atorvastatin currently. She has been trying to improve her cholesterol levels with intensive lifestyle modification including a low saturated fat diet, exercise and weight loss. She denies any chest pain, claudication or myalgias.  ALLERGIES: Allergies  Allergen Reactions  . Latex Itching  . Ciprofloxacin Hives  . Metronidazole Hives  . Adhesive [Tape] Rash    MEDICATIONS: Current Outpatient Medications on File Prior to Visit  Medication Sig Dispense Refill  . acetaminophen (TYLENOL) 500 MG tablet Take by mouth as needed.     [name] atorvastatin (LIPITOR) 20 MG tablet Take 1 tablet (20 mg total) by mouth daily. 90 tablet 0  . CALCIUM  PO Take 1 tablet by mouth daily.    . Cholecalciferol (VITAMIN D3) 2000 units TABS Take by mouth. Take one tablet in the AM    . clopidogrel (PLAVIX) 75 MG tablet Take 1 tablet (75 mg total) by mouth daily. 90 tablet 0  . desonide (DESOWEN) 0.05 % cream Apply topically as needed.     [name] econazole nitrate 1 % cream Apply topically as needed.     . ezetimibe (ZETIA) 10 MG tablet Take 1 tablet (10 mg total) by mouth daily. 90 tablet 3  . fluticasone (FLONASE) 50 MCG/ACT nasal spray Place 1 spray into both nostrils daily as needed for allergies.     [name] lisinopril (PRINIVIL,ZESTRIL) 10 MG tablet Take 1 tablet (10 mg total) by mouth daily. 90 tablet 3  . metFORMIN (GLUCOPHAGE) 500 MG tablet Take 1 tablet (500 mg total) by mouth 2 (two) times daily with a meal. 180 tablet 0  . metroNIDAZOLE (METROCREAM) 0.75 % cream Apply topically 2 (two) times daily.    . mirabegron ER (MYRBETRIQ) 50 MG TB24 tablet Take 50 mg by mouth daily.     [name] omega-3 acid ethyl esters (LOVAZA) 1 g capsule Take 2 capsules (2 g total) by mouth 2 (two) times daily. 360 capsule 3  . oxyCODONE-acetaminophen (ROXICET) 5-325 MG/5ML solution Take by mouth every 4 (four) hours as needed for severe pain.    [name] PSYLLIUM PO Take by mouth.     No current facility-administered medications on file prior to visit.  PAST MEDICAL HISTORY: Past Medical History:  Diagnosis Date  . Acid reflux disease 03/09/2015  . Aortic stenosis 03/09/2015  . AR (allergic rhinitis) 03/09/2015  . Arthritis   . Back pain   . Controlled type 2 diabetes mellitus without complication, without long-term current use of insulin ([place]) 08/08/2016  . Cough   . 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 Decreased hearing   . Diabetes mellitus without complication ([place])   . Dry mouth   . Easy bruising   . Essential hypertension 08/08/2016  . Heart murmur   . 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 History of CVA (cerebrovascular accident) 08/08/2016   Seen on  MRI from 08/2015  . Hyperlipidemia   . Hypertension   . 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 IBS (irritable bowel syndrome) 07/16/2015  . Insomnia 07/16/2015  . Joint pain   . Knee pain   . Left ventricular hypertrophy 07/16/2015  . Leg cramping    right leg  . Mixed hyperlipidemia 08/08/2016  . Morbid obesity ([place]) 11/10/2015  . Nasal discharge   . Nuclear sclerotic cataract of left eye 07/07/2016  . OAB (overactive bladder) 01/20/2016  . OSA (obstructive sleep apnea) 03/09/2015  . Osteoporosis 03/09/2015  . 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 Pulmonary hypertension ([place]) 07/16/2015  . Red eyes   . Right kidney stone   . Shortness of breath on exertion   . Sleep apnea   . Stroke ([place])   . Swelling of both lower extremities   . UTI (urinary tract infection)   . Vitamin D deficiency 03/09/2015    PAST SURGICAL HISTORY: Past Surgical History:  Procedure Laterality Date  . ABDOMINAL HYSTERECTOMY  09/14/2016  . CATARACT EXTRACTION Left   . CATARACT EXTRACTION Left 06/2016  . COLPORRHAPHY N/A 09/14/2016   [facility]  . CYSTOCELE REPAIR    . FRACTURE SURGERY Left 1953   L arm  . FRACTURE SURGERY Left 1985   L ankle  . HERNIA REPAIR  2004  . JOINT REPLACEMENT Right 2012   R TKA  . JOINT REPLACEMENT Left 2017  . KIDNEY STONE SURGERY    . LAPAROSCOPIC ASSISTED VAGINAL HYSTERECTOMY N/A 09/14/2016   [facility]  . R foot/ankle Right 2013 and 2014   plate and screws lateral foot and tendon removal medial foot in 2013, revision in 2014  . RECTOCELE REPAIR    . URETERAL STENT PLACEMENT    . URETEROSCOPY      SOCIAL HISTORY: Social History   Tobacco Use  . Smoking status: Never Smoker  . Smokeless tobacco: Never Used  Substance Use Topics  . Alcohol use: Yes    Alcohol/week: 1.0 standard drinks    Types: 1 Glasses of wine per week    Comment: couple of glasses per week  . Drug use: No    FAMILY HISTORY: Family History  Problem Relation Age of Onset  . Hyperlipidemia  Mother   . Heart disease Mother   . Hypertension Mother   . Stroke Mother   . Thyroid disease Mother   . Hypertension Father   . Heart disease Father     ROS: Review of Systems  Constitutional: Negative for weight loss.  Cardiovascular: Negative for chest pain and claudication.  Gastrointestinal: Negative for diarrhea, nausea and vomiting.  Musculoskeletal: Negative for myalgias.       Negative for muscle weakness    PHYSICAL EXAM: Blood pressure 132/80, pulse 74, height 5\' 1"  (1.549 m), weight 226 lb (  102.5 kg), SpO2 99 %. Body mass index is 42.7 kg/m. Physical Exam  Constitutional: She is oriented to person, place, and time. She appears well-developed and well-nourished.  Cardiovascular: Normal rate.  Pulmonary/Chest: Effort normal.  Musculoskeletal: Normal range of motion.  Neurological: She is oriented to person, place, and time.  Skin: Skin is warm and dry.  Psychiatric: She has a normal mood and affect. Her behavior is normal.  Vitals reviewed.   RECENT LABS AND TESTS: BMET    Component Value Date/Time   NA 142 06/21/2017 1057   K 4.3 06/21/2017 1057   CL 103 06/21/2017 1057   CO2 23 06/21/2017 1057   GLUCOSE 111 (H) 06/21/2017 1057   GLUCOSE 109 (H) 05/24/2017 1023   BUN 10 06/21/2017 1057   CREATININE 0.46 (L) 06/21/2017 1057   CALCIUM 9.0 06/21/2017 1057   GFRNONAA 100 06/21/2017 1057   GFRAA 115 06/21/2017 1057   Lab Results  Component Value Date   HGBA1C 7.8 (H) 05/24/2017   HGBA1C 6.7 (H) 12/14/2016   HGBA1C 7.1 (H) 08/08/2016   HGBA1C 6.9 04/18/2016   HGBA1C 7.2 01/18/2016   Lab Results  Component Value Date   INSULIN 17.6 06/21/2017   CBC    Component Value Date/Time   WBC 6.4 05/24/2017 1023   RBC 4.51 05/24/2017 1023   HGB 13.2 05/24/2017 1023   HCT 39.8 05/24/2017 1023   PLT 276.0 05/24/2017 1023   MCV 88.2 05/24/2017 1023   MCHC 33.2 05/24/2017 1023   RDW 16.1 (H) 05/24/2017 1023   Iron/TIBC/Ferritin/ %Sat No results found  for: IRON, TIBC, FERRITIN, IRONPCTSAT Lipid Panel     Component Value Date/Time   CHOL 119 06/21/2017 1057   TRIG 178 (H) 06/21/2017 1057   HDL 45 06/21/2017 1057   CHOLHDL 3 12/14/2016 0856   VLDL 51.4 (H) 12/14/2016 0856   LDLCALC 38 06/21/2017 1057   LDLDIRECT 66.0 12/14/2016 0856   Hepatic Function Panel     Component Value Date/Time   PROT 6.5 06/21/2017 1057   ALBUMIN 4.3 06/21/2017 1057   AST 15 06/21/2017 1057   ALT 10 06/21/2017 1057   ALKPHOS 64 06/21/2017 1057   BILITOT 0.7 06/21/2017 1057      Component Value Date/Time   TSH 1.610 06/21/2017 1057   Results for SHAINNA, FAUX (MRN [id number]) as of 09/28/2017 13:49  Ref. Range 06/21/2017 10:57  Vitamin D, 25-Hydroxy Latest Ref Range: 30.0 - 100.0 ng/mL 36.3   ASSESSMENT AND PLAN: Type 2 diabetes mellitus without complication, without long-term current use of insulin (HCC) - Plan: Comprehensive metabolic panel, Hemoglobin A1c, Insulin, random, Lipid panel  Vitamin D deficiency - Plan: VITAMIN D 25 Hydroxy (Vit-D Deficiency, Fractures), Vitamin D, Ergocalciferol, (DRISDOL) 50000 units CAPS capsule  Class 3 severe obesity with serious comorbidity and body mass index (BMI) of 40.0 to 44.9 in adult, unspecified obesity type (HCC)  PLAN:  Diabetes II Meleah has been given extensive diabetes education by myself today including ideal fasting and post-prandial blood glucose readings, individual ideal Hgb A1c goals and hypoglycemia prevention. We discussed the importance of good blood sugar control to decrease the likelihood of diabetic complications such as nephropathy, neuropathy, limb loss, blindness, coronary artery disease, and death. We discussed the importance of intensive lifestyle modification including diet, exercise and weight loss as the first line treatment for diabetes. Marjie agrees to continue her diabetes medications, diet, exercise and weight loss. We will check labs and Asley will follow up at the agreed  upon  time.  Vitamin D Deficiency Choua was informed that low vitamin D levels contributes to fatigue and are associated with obesity, breast, and colon cancer. She agrees to continue to take prescription Vit D @50 ,000 IU every week #4 with no refills. We will check labs and will follow up for routine testing of vitamin D, at least 2-3 times per year. She was informed of the risk of over-replacement of vitamin D and agrees to not increase her dose unless she discusses this with [name] first.  Hyperlipidemia Breyonna was informed of the American Heart Association Guidelines emphasizing intensive lifestyle modifications as the first line treatment for hyperlipidemia. We discussed many lifestyle modifications today in depth, and Amberli will continue to work on decreasing saturated fats such as fatty red meat, butter and many fried foods. She will also increase vegetables and lean protein in her diet and continue to work on exercise and weight loss efforts. We will check labs. Etna will continue atorvastatin and follow up as directed.  Obesity Toneisha is currently in the action stage of change. As such, her goal is to continue with weight loss efforts She has agreed to follow the Category 2 plan Yancy has been instructed to work up to a goal of 150 minutes of combined cardio and strengthening exercise per week for weight loss and overall health benefits. We discussed the following Behavioral Modification Strategies today: decrease eating out and work on meal planning and easy cooking plans  Hatsue has agreed to follow up with our clinic in 3 weeks. She was informed of the importance of frequent follow up visits to maximize her success with intensive lifestyle modifications for her multiple health conditions.   OBESITY BEHAVIORAL INTERVENTION VISIT  Today's visit was # 6 out of 22.  Starting weight: 248 lbs Starting date: 09/07/17 Today's weight : 226 lbs Today's date: 09/28/2017 Total lbs lost  to date: 21    ASK: We discussed the diagnosis of obesity with [name] today and Chalese agreed to give [name] permission to discuss obesity behavioral modification therapy today.  ASSESS: Kaileia has the diagnosis of obesity and her BMI today is 42.72 [place] is in the action stage of change   ADVISE: Rachael was educated on the multiple health risks of obesity as well as the benefit of weight loss to improve her health. She was advised of the need for long term treatment and the importance of lifestyle modifications.  AGREE: Multiple dietary modification options and treatment options were discussed and  Riti agreed to the above obesity treatment plan.  IDoreene Nest, am acting as transcriptionist for [employer], PA-C

## 2017-09-29 LAB — LIPID PANEL
CHOLESTEROL TOTAL: 116 mg/dL (ref 100–199)
Chol/HDL Ratio: 2.8 ratio (ref 0.0–4.4)
HDL: 42 mg/dL (ref >39)
LDL CALC: 43 mg/dL (ref 0–99)
Triglycerides: 153 mg/dL — ABNORMAL HIGH (ref 0–149)
VLDL CHOLESTEROL CAL: 31 mg/dL (ref 5–40)

## 2017-09-29 LAB — COMPREHENSIVE METABOLIC PANEL
ALBUMIN: 4.8 g/dL (ref 3.5–4.8)
ALT: 11 IU/L (ref 0–32)
AST: 15 IU/L (ref 0–40)
Albumin/Globulin Ratio: 2.5 — ABNORMAL HIGH (ref 1.2–2.2)
Alkaline Phosphatase: 63 IU/L (ref 39–117)
BILIRUBIN TOTAL: 0.7 mg/dL (ref 0.0–1.2)
BUN / CREAT RATIO: 21 (ref 12–28)
BUN: 14 mg/dL (ref 8–27)
CALCIUM: 9.8 mg/dL (ref 8.7–10.3)
CO2: 22 mmol/L (ref 20–29)
Chloride: 102 mmol/L (ref 96–106)
Creatinine, Ser: 0.66 mg/dL (ref 0.57–1.00)
GFR, EST AFRICAN AMERICAN: 102 mL/min/{1.73_m2} (ref >59)
GFR, EST NON AFRICAN AMERICAN: 89 mL/min/{1.73_m2} (ref >59)
GLUCOSE: 109 mg/dL — AB (ref 65–99)
Globulin, Total: 1.9 g/dL (ref 1.5–4.5)
Potassium: 5.1 mmol/L (ref 3.5–5.2)
Sodium: 139 mmol/L (ref 134–144)
TOTAL PROTEIN: 6.7 g/dL (ref 6.0–8.5)

## 2017-09-29 LAB — VITAMIN D 25 HYDROXY (VIT D DEFICIENCY, FRACTURES): Vit D, 25-Hydroxy: 45.7 ng/mL (ref 30.0–100.0)

## 2017-09-29 LAB — HEMOGLOBIN A1C
Est. average glucose Bld gHb Est-mCnc: 137 mg/dL
Hgb A1c MFr Bld: 6.4 % — ABNORMAL HIGH (ref 4.8–5.6)

## 2017-09-29 LAB — INSULIN, RANDOM: INSULIN: 24.2 u[IU]/mL (ref 2.6–24.9)

## 2017-10-19 ENCOUNTER — Ambulatory Visit (INDEPENDENT_AMBULATORY_CARE_PROVIDER_SITE_OTHER): Payer: Medicare Other | Admitting: Family Medicine

## 2017-10-19 VITALS — BP 137/75 | HR 68 | Temp 97.5°F | Ht 61.0 in | Wt 227.0 lb

## 2017-10-19 DIAGNOSIS — E7849 Other hyperlipidemia: Secondary | ICD-10-CM | POA: Diagnosis not present

## 2017-10-19 DIAGNOSIS — Z6841 Body Mass Index (BMI) 40.0 and over, adult: Secondary | ICD-10-CM | POA: Diagnosis not present

## 2017-10-19 DIAGNOSIS — E559 Vitamin D deficiency, unspecified: Secondary | ICD-10-CM

## 2017-10-19 DIAGNOSIS — E119 Type 2 diabetes mellitus without complications: Secondary | ICD-10-CM

## 2017-10-19 MED ORDER — VITAMIN D (ERGOCALCIFEROL) 1.25 MG (50000 UNIT) PO CAPS: 50000.0000 [IU] | ORAL_CAPSULE | ORAL | 0 refills | Status: DC

## 2017-10-20 NOTE — Progress Notes (Signed)
Office: [phone]  /  Fax: [phone]   HPI:   Chief Complaint: OBESITY Jasmin [name] is here to discuss her progress with her obesity treatment plan. She is on the Category 2 plan and is following her eating plan approximately 50 % of the time. She states she is doing water aerobics, balance class, and walking for 30-60 minutes 5-6 times per week. Jasmin [name] was traveling and increased eating out but she is ready to get back on tracks. Her exercise is going well and she feels wll.  Her weight is 227 lb (103 kg) today and has gained 1 pound since her last visit. She has lost 21 lbs since starting treatment with [name].  Vitamin D Deficiency Jasmin [name] has a diagnosis of vitamin D deficiency. She is stable on prescription Vit D, her level is almost at goal. She denies nausea, vomiting or muscle weakness.  Diabetes II Jasmin [name] has a diagnosis of diabetes type II. Morrison states fasting BGs range between 90 and 110, and her last A1c decreased to 6.4. She denies any hypoglycemic episodes. She has been working on intensive lifestyle modifications including diet, exercise, and weight loss to help control her blood glucose levels.  Hyperlipidemia Jasmin [name] has hyperlipidemia and has been trying to improve her cholesterol levels with intensive lifestyle modification including a low saturated fat diet, exercise and weight loss. She is on Lipitor, Zetia, and Lovaza and her cholesterol is well controlled. She denies any chest pain, claudication or myalgias.  ALLERGIES: Allergies  Allergen Reactions  . Latex Itching  . Ciprofloxacin Hives  . Metronidazole Hives  . Adhesive [Tape] Rash    MEDICATIONS: Current Outpatient Medications on File Prior to Visit  Medication Sig Dispense Refill  . acetaminophen (TYLENOL) 500 MG tablet Take by mouth as needed.     [name] atorvastatin (LIPITOR) 20 MG tablet Take 1 tablet (20 mg total) by mouth daily. 90 tablet 0  . CALCIUM PO Take 1 tablet by mouth daily.    .  Cholecalciferol (VITAMIN D3) 2000 units TABS Take by mouth. Take one tablet in the AM    . clopidogrel (PLAVIX) 75 MG tablet Take 1 tablet (75 mg total) by mouth daily. 90 tablet 0  . desonide (DESOWEN) 0.05 % cream Apply topically as needed.     [name] econazole nitrate 1 % cream Apply topically as needed.     . fluticasone (FLONASE) 50 MCG/ACT nasal spray Place 1 spray into both nostrils daily as needed for allergies.     [name] lisinopril (PRINIVIL,ZESTRIL) 10 MG tablet Take 1 tablet (10 mg total) by mouth daily. 90 tablet 3  . metFORMIN (GLUCOPHAGE) 500 MG tablet Take 1 tablet (500 mg total) by mouth 2 (two) times daily with a meal. 180 tablet 0  . metroNIDAZOLE (METROCREAM) 0.75 % cream Apply topically 2 (two) times daily.    . mirabegron ER (MYRBETRIQ) 50 MG TB24 tablet Take 50 mg by mouth daily.     [name] omega-3 acid ethyl esters (LOVAZA) 1 g capsule Take 2 capsules (2 g total) by mouth 2 (two) times daily. 360 capsule 3  . oxyCODONE-acetaminophen (ROXICET) 5-325 MG/5ML solution Take by mouth every 4 (four) hours as needed for severe pain.    [name] PSYLLIUM PO Take by mouth.     No current facility-administered medications on file prior to visit.     PAST MEDICAL HISTORY: Past Medical History:  Diagnosis Date  . Acid reflux disease 03/09/2015  . Aortic stenosis 03/09/2015  . AR (allergic rhinitis) 03/09/2015  .  Arthritis   . Back pain   . Controlled type 2 diabetes mellitus without complication, without long-term current use of insulin ([place]) 08/08/2016  . Cough   . 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 Decreased hearing   . Diabetes mellitus without complication ([place])   . Dry mouth   . Easy bruising   . Essential hypertension 08/08/2016  . Heart murmur   . 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 History of CVA (cerebrovascular accident) 08/08/2016   Seen on MRI from 08/2015  . Hyperlipidemia   . Hypertension   . 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 IBS (irritable bowel syndrome)  07/16/2015  . Insomnia 07/16/2015  . Joint pain   . Knee pain   . Left ventricular hypertrophy 07/16/2015  . Leg cramping    right leg  . Mixed hyperlipidemia 08/08/2016  . Morbid obesity ([place]) 11/10/2015  . Nasal discharge   . Nuclear sclerotic cataract of left eye 07/07/2016  . OAB (overactive bladder) 01/20/2016  . OSA (obstructive sleep apnea) 03/09/2015  . Osteoporosis 03/09/2015  . 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 Pulmonary hypertension ([place]) 07/16/2015  . Red eyes   . Right kidney stone   . Shortness of breath on exertion   . Sleep apnea   . Stroke ([place])   . Swelling of both lower extremities   . UTI (urinary tract infection)   . Vitamin D deficiency 03/09/2015    PAST SURGICAL HISTORY: Past Surgical History:  Procedure Laterality Date  . ABDOMINAL HYSTERECTOMY  09/14/2016  . CATARACT EXTRACTION Left   . CATARACT EXTRACTION Left 06/2016  . COLPORRHAPHY N/A 09/14/2016   [facility]  . CYSTOCELE REPAIR    . FRACTURE SURGERY Left 1953   L arm  . FRACTURE SURGERY Left 1985   L ankle  . HERNIA REPAIR  2004  . JOINT REPLACEMENT Right 2012   R TKA  . JOINT REPLACEMENT Left 2017  . KIDNEY STONE SURGERY    . LAPAROSCOPIC ASSISTED VAGINAL HYSTERECTOMY N/A 09/14/2016   [facility]  . R foot/ankle Right 2013 and 2014   plate and screws lateral foot and tendon removal medial foot in 2013, revision in 2014  . RECTOCELE REPAIR    . URETERAL STENT PLACEMENT    . URETEROSCOPY      SOCIAL HISTORY: Social History   Tobacco Use  . Smoking status: Never Smoker  . Smokeless tobacco: Never Used  Substance Use Topics  . Alcohol use: Yes    Alcohol/week: 1.0 standard drinks    Types: 1 Glasses of wine per week    Comment: couple of glasses per week  . Drug use: No    FAMILY HISTORY: Family History  Problem Relation Age of Onset  . Hyperlipidemia Mother   . Heart disease Mother   . Hypertension Mother   . Stroke Mother   . Thyroid disease Mother   . Hypertension Father     . Heart disease Father     ROS: Review of Systems  Constitutional: Negative for weight loss.  Cardiovascular: Negative for chest pain and claudication.  Gastrointestinal: Negative for nausea and vomiting.  Musculoskeletal: Negative for myalgias.       Negative muscle weakness  Endo/Heme/Allergies:       Negative hypoglycemia    PHYSICAL EXAM: Blood pressure 137/75, pulse 68, temperature (!) 97.5 F (36.4 C), temperature source Oral, height 5\' 1"  (1.549 m), weight 227 lb (103 kg), SpO2 99 %. Body mass index is 42.89  kg/m. Physical Exam  Constitutional: She is oriented to person, place, and time. She appears well-developed and well-nourished.  Cardiovascular: Normal rate.  Pulmonary/Chest: Effort normal.  Musculoskeletal: Normal range of motion.  Neurological: She is oriented to person, place, and time.  Skin: Skin is warm and dry.  Psychiatric: She has a normal mood and affect. Her behavior is normal.  Vitals reviewed.   RECENT LABS AND TESTS: BMET    Component Value Date/Time   NA 139 09/28/2017 0817   K 5.1 09/28/2017 0817   CL 102 09/28/2017 0817   CO2 22 09/28/2017 0817   GLUCOSE 109 (H) 09/28/2017 0817   GLUCOSE 109 (H) 05/24/2017 1023   BUN 14 09/28/2017 0817   CREATININE 0.66 09/28/2017 0817   CALCIUM 9.8 09/28/2017 0817   GFRNONAA 89 09/28/2017 0817   GFRAA 102 09/28/2017 0817   Lab Results  Component Value Date   HGBA1C 6.4 (H) 09/28/2017   HGBA1C 7.8 (H) 05/24/2017   HGBA1C 6.7 (H) 12/14/2016   HGBA1C 7.1 (H) 08/08/2016   HGBA1C 6.9 04/18/2016   Lab Results  Component Value Date   INSULIN 24.2 09/28/2017   INSULIN 17.6 06/21/2017   CBC    Component Value Date/Time   WBC 6.4 05/24/2017 1023   RBC 4.51 05/24/2017 1023   HGB 13.2 05/24/2017 1023   HCT 39.8 05/24/2017 1023   PLT 276.0 05/24/2017 1023   MCV 88.2 05/24/2017 1023   MCHC 33.2 05/24/2017 1023   RDW 16.1 (H) 05/24/2017 1023   Iron/TIBC/Ferritin/ %Sat No results found for: IRON,  TIBC, FERRITIN, IRONPCTSAT Lipid Panel     Component Value Date/Time   CHOL 116 09/28/2017 0817   TRIG 153 (H) 09/28/2017 0817   HDL 42 09/28/2017 0817   CHOLHDL 2.8 09/28/2017 0817   CHOLHDL 3 12/14/2016 0856   VLDL 51.4 (H) 12/14/2016 0856   LDLCALC 43 09/28/2017 0817   LDLDIRECT 66.0 12/14/2016 0856   Hepatic Function Panel     Component Value Date/Time   PROT 6.7 09/28/2017 0817   ALBUMIN 4.8 09/28/2017 0817   AST 15 09/28/2017 0817   ALT 11 09/28/2017 0817   ALKPHOS 63 09/28/2017 0817   BILITOT 0.7 09/28/2017 0817      Component Value Date/Time   TSH 1.610 06/21/2017 1057  Results for JANEEN, WATSON (MRN [id number]) as of 10/20/2017 15:21  Ref. Range 09/28/2017 08:17  Vitamin D, 25-Hydroxy Latest Ref Range: 30.0 - 100.0 ng/mL 45.7    ASSESSMENT AND PLAN: Vitamin D deficiency - Plan: Vitamin D, Ergocalciferol, (DRISDOL) 50000 units CAPS capsule  Type 2 diabetes mellitus without complication, without long-term current use of insulin ([facility])  Other hyperlipidemia  Class 3 severe obesity with serious comorbidity and body mass index (BMI) of 40.0 to 44.9 in adult, unspecified obesity type ([place])  PLAN:  Vitamin D Deficiency Statia was informed that low vitamin D levels contributes to fatigue and are associated with obesity, breast, and colon cancer. Chattie agrees to continue taking prescription Vit D @50 ,000 IU every week #4 and we will refill for 1 month. She will follow up for routine testing of vitamin D, at least 2-3 times per year. She was informed of the risk of over-replacement of vitamin D and agrees to not increase her dose unless she discusses this with [name] first. Jammie agrees to follow up with our clinic in 3 weeks.  Diabetes II Taralyn has been given extensive diabetes education by myself today including ideal fasting and post-prandial blood glucose readings, individual  ideal Hgb A1c goals and hypoglycemia prevention. We discussed the importance of good blood  sugar control to decrease the likelihood of diabetic complications such as nephropathy, neuropathy, limb loss, blindness, coronary artery disease, and death. We discussed the importance of intensive lifestyle modification including diet, exercise and weight loss as the first line treatment for diabetes. Betsaida agrees to continue taking metformin, and diet, and she agrees to follow up with our clinic in 3 weeks.  Hyperlipidemia Dhana was informed of the American Heart Association Guidelines emphasizing intensive lifestyle modifications as the first line treatment for hyperlipidemia. We discussed many lifestyle modifications today in depth, and Anaid will continue to work on decreasing saturated fats such as fatty red meat, butter and many fried foods. Ryli agrees to discontinue Zetia and continue taking Lovaza and Lipitor. She will also increase vegetables and lean protein in her diet and continue to work on diet, exercise, and weight loss efforts. We will check labs in 3 months. Callia agrees to follow up with our clinic in 3 weeks.  Obesity Stepanie is currently in the action stage of change. As such, her goal is to continue with weight loss efforts She has agreed to follow the Category 2 plan Sunshyne has been instructed to work up to a goal of 150 minutes of combined cardio and strengthening exercise per week for weight loss and overall health benefits. We discussed the following Behavioral Modification Strategies today: increasing lean protein intake, decreasing simple carbohydrates  and work on meal planning and easy cooking plans   Geral has agreed to follow up with our clinic in 3 weeks. She was informed of the importance of frequent follow up visits to maximize her success with intensive lifestyle modifications for her multiple health conditions.   OBESITY BEHAVIORAL INTERVENTION VISIT  Today's visit was # 7   Starting weight: 248 lbs Starting date: 06/21/17 Today's weight : 227  lbs  Today's date: 10/19/2017 Total lbs lost to date: 21 At least 15 minutes were spent on discussing the following behavioral intervention visit.   ASK: We discussed the diagnosis of obesity with [name] today and Narely agreed to give [name] permission to discuss obesity behavioral modification therapy today.  ASSESS: Leshea has the diagnosis of obesity and her BMI today is 42.91 Aviyana is in the action stage of change   ADVISE: Mareesa was educated on the multiple health risks of obesity as well as the benefit of weight loss to improve her health. She was advised of the need for long term treatment and the importance of lifestyle modifications to improve her current health and to decrease her risk of future health problems.  AGREE: Multiple dietary modification options and treatment options were discussed and  Shaniece agreed to follow the recommendations documented in the above note.  ARRANGE: Damiah was educated on the importance of frequent visits to treat obesity as outlined per CMS and USPSTF guidelines and agreed to schedule her next follow up appointment today.  I, Trixie Dredge, am acting as transcriptionist for Dennard Nip, MD  I have reviewed the above documentation for accuracy and completeness, and I agree with the above. -Dennard Nip, MD

## 2017-10-24 ENCOUNTER — Other Ambulatory Visit: Payer: Self-pay | Admitting: Family Medicine

## 2017-10-24 DIAGNOSIS — I10 Essential (primary) hypertension: Secondary | ICD-10-CM

## 2017-10-26 ENCOUNTER — Ambulatory Visit: Payer: Medicare Other

## 2017-10-27 ENCOUNTER — Ambulatory Visit: Payer: Medicare Other

## 2017-11-06 ENCOUNTER — Ambulatory Visit (INDEPENDENT_AMBULATORY_CARE_PROVIDER_SITE_OTHER): Payer: Medicare Other

## 2017-11-06 DIAGNOSIS — Z23 Encounter for immunization: Secondary | ICD-10-CM

## 2017-11-13 ENCOUNTER — Ambulatory Visit (HOSPITAL_BASED_OUTPATIENT_CLINIC_OR_DEPARTMENT_OTHER)
Admission: RE | Admit: 2017-11-13 | Discharge: 2017-11-13 | Disposition: A | Discharge status: Home / Self Care | Payer: Medicare Other | Source: Ambulatory Visit | Attending: Family Medicine | Admitting: Family Medicine

## 2017-11-13 ENCOUNTER — Ambulatory Visit (INDEPENDENT_AMBULATORY_CARE_PROVIDER_SITE_OTHER): Payer: Self-pay | Admitting: Family Medicine

## 2017-11-13 ENCOUNTER — Encounter (HOSPITAL_BASED_OUTPATIENT_CLINIC_OR_DEPARTMENT_OTHER): Payer: Self-pay

## 2017-11-13 DIAGNOSIS — Z1231 Encounter for screening mammogram for malignant neoplasm of breast: Secondary | ICD-10-CM

## 2017-11-17 ENCOUNTER — Telehealth: Payer: Self-pay | Admitting: Family Medicine

## 2017-11-17 NOTE — Telephone Encounter (Signed)
Caller name: Relation to pt: Call back Shan[facility]: [facility]  Reason for call:  Pt states that pharmacy downstairs informed her that they called and spoke with someone regarding  her metformin refill.. States she switched pharmacies and would like for it to go downstairs. Pt is requesting 90 day supply. Pt states she has enough to get her through the weekend.

## 2017-11-19 ENCOUNTER — Other Ambulatory Visit: Payer: Self-pay | Admitting: Family Medicine

## 2017-11-19 DIAGNOSIS — E782 Mixed hyperlipidemia: Secondary | ICD-10-CM

## 2017-11-23 ENCOUNTER — Other Ambulatory Visit: Payer: Self-pay

## 2017-11-23 DIAGNOSIS — E119 Type 2 diabetes mellitus without complications: Secondary | ICD-10-CM

## 2017-11-23 MED ORDER — METFORMIN HCL 500 MG PO TABS: 500.0000 mg | ORAL_TABLET | Freq: Two times a day (BID) | ORAL | 0 refills | Status: DC

## 2017-11-23 MED FILL — metFORMIN HCL 500 MG TABS: 500 | 90 days supply | Qty: 180 | Fill #0

## 2017-11-23 NOTE — Telephone Encounter (Signed)
Refill was sent in to [facility] for 90 days. Patient NEEDS an appt for further refills.

## 2017-11-23 NOTE — Telephone Encounter (Signed)
Pt is scheduled for 01/31/18 @ 8:45am

## 2017-11-23 NOTE — Telephone Encounter (Signed)
REFILL FOR METFORMIN SENT IN. Patient needs appt for further refills.

## 2017-11-23 NOTE — Telephone Encounter (Signed)
Hey this patient is here checking on this request. She said this is the 3rd time this has happened and she would like to know what is going on. I told the patient I would give her a call once I knew more. Please advise.

## 2017-11-27 ENCOUNTER — Ambulatory Visit (INDEPENDENT_AMBULATORY_CARE_PROVIDER_SITE_OTHER): Payer: Medicare Other | Admitting: Family Medicine

## 2017-11-27 VITALS — BP 142/70 | HR 69 | Temp 97.7°F | Ht 61.0 in | Wt 225.0 lb

## 2017-11-27 DIAGNOSIS — I10 Essential (primary) hypertension: Secondary | ICD-10-CM | POA: Diagnosis not present

## 2017-11-27 DIAGNOSIS — Z6841 Body Mass Index (BMI) 40.0 and over, adult: Secondary | ICD-10-CM | POA: Diagnosis not present

## 2017-11-27 DIAGNOSIS — E559 Vitamin D deficiency, unspecified: Secondary | ICD-10-CM

## 2017-11-27 MED ORDER — VITAMIN D (ERGOCALCIFEROL) 1.25 MG (50000 UNIT) PO CAPS: 50000.0000 [IU] | ORAL_CAPSULE | ORAL | 0 refills | Status: DC

## 2017-11-28 ENCOUNTER — Other Ambulatory Visit: Payer: Self-pay | Admitting: Family Medicine

## 2017-11-28 DIAGNOSIS — E782 Mixed hyperlipidemia: Secondary | ICD-10-CM

## 2017-11-28 DIAGNOSIS — Z8673 Personal history of transient ischemic attack (TIA), and cerebral infarction without residual deficits: Secondary | ICD-10-CM

## 2017-11-28 NOTE — Progress Notes (Signed)
Office: [phone]  /  Fax: [phone]   HPI:   Chief Complaint: OBESITY Jasmin [name] is here to discuss her progress with her obesity treatment plan. She is on the  follow the Category 2 plan and is following her eating plan approximately 25 % of the time. She states she is exercising 0 minutes 0 times per week. Jasmin [name] continue to do well with weight loss. She struggled to follow her plan due to home renovations which are finished today. She was still mindful and tried to portion control and make smarter choices. She is ready to get back to her exercises.  Her weight is   today and has had a weight loss of 2 pounds over a period of 6 weeks since her last visit. She has lost 23 lbs since starting treatment with [name].  Vitamin D deficiency Jasmin [name] has a diagnosis of vitamin D deficiency. She is currently taking vit D but not yet at goal and denies nausea, vomiting or muscle weakness.   Ref. Range 09/28/2017 08:17  Vitamin D, 25-Hydroxy Latest Ref Range: 30.0 - 100.0 ng/mL 45.7   Hypertension Jasmin [name] is a 72 y.o. female with hypertension.  Jasmin [name] denies chest pain, headache or shortness of breath on exertion. She is working weight loss to help control her blood pressure with the goal of decreasing her risk of heart attack and stroke. Jasmin [name] blood pressure is elevated today, she has had increased stress and feels this is the cause.    ALLERGIES: Allergies  Allergen Reactions  . Latex Itching  . Ciprofloxacin Hives  . Metronidazole Hives  . Adhesive [Tape] Rash    MEDICATIONS: Current Outpatient Medications on File Prior to Visit  Medication Sig Dispense Refill  . acetaminophen (TYLENOL) 500 MG tablet Take by mouth as needed.     [name] atorvastatin (LIPITOR) 20 MG tablet Take 1 tablet (20 mg total) by mouth daily. 90 tablet 0  . CALCIUM PO Take 1 tablet by mouth daily.    . Cholecalciferol (VITAMIN D3) 2000 units TABS Take by mouth. Take one tablet in the AM    .  clopidogrel (PLAVIX) 75 MG tablet Take 1 tablet (75 mg total) by mouth daily. 90 tablet 0  . desonide (DESOWEN) 0.05 % cream Apply topically as needed.     [name] econazole nitrate 1 % cream Apply topically as needed.     . fluticasone (FLONASE) 50 MCG/ACT nasal spray Place 1 spray into both nostrils daily as needed for allergies.     [name] lisinopril (PRINIVIL,ZESTRIL) 10 MG tablet TAKE ONE (1) TABLET EACH DAY 90 tablet 0  . metFORMIN (GLUCOPHAGE) 500 MG tablet Take 1 tablet (500 mg total) by mouth 2 (two) times daily with a meal. 180 tablet 0  . metroNIDAZOLE (METROCREAM) 0.75 % cream Apply topically 2 (two) times daily.    . mirabegron ER (MYRBETRIQ) 50 MG TB24 tablet Take 50 mg by mouth daily.     [name] omega-3 acid ethyl esters (LOVAZA) 1 g capsule Take 2 capsules (2 g total) by mouth 2 (two) times daily. 360 capsule 1  . oxyCODONE-acetaminophen (ROXICET) 5-325 MG/5ML solution Take by mouth every 4 (four) hours as needed for severe pain.    [name] PSYLLIUM PO Take by mouth.     No current facility-administered medications on file prior to visit.     PAST MEDICAL HISTORY: Past Medical History:  Diagnosis Date  . Acid reflux disease 03/09/2015  . Aortic stenosis 03/09/2015  . AR (allergic rhinitis)  03/09/2015  . Arthritis   . Back pain   . Controlled type 2 diabetes mellitus without complication, without long-term current use of insulin (Jasmin [name]) 08/08/2016  . Cough   . 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 Decreased hearing   . Diabetes mellitus without complication ([place])   . Dry mouth   . Easy bruising   . Essential hypertension 08/08/2016  . Heart murmur   . 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 History of CVA (cerebrovascular accident) 08/08/2016   Seen on MRI from 08/2015  . Hyperlipidemia   . Hypertension   . 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 IBS (irritable bowel syndrome) 07/16/2015  . Insomnia 07/16/2015  . Joint pain   . Knee pain   . Left ventricular hypertrophy 07/16/2015  .  Leg cramping    right leg  . Mixed hyperlipidemia 08/08/2016  . Morbid obesity ([place]) 11/10/2015  . Nasal discharge   . Nuclear sclerotic cataract of left eye 07/07/2016  . OAB (overactive bladder) 01/20/2016  . OSA (obstructive sleep apnea) 03/09/2015  . Osteoporosis 03/09/2015  . 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 Pulmonary hypertension ([place]) 07/16/2015  . Red eyes   . Right kidney stone   . Shortness of breath on exertion   . Sleep apnea   . Stroke ([employer])   . Swelling of both lower extremities   . UTI (urinary tract infection)   . Vitamin D deficiency 03/09/2015    PAST SURGICAL HISTORY: Past Surgical History:  Procedure Laterality Date  . ABDOMINAL HYSTERECTOMY  09/14/2016  . CATARACT EXTRACTION Left   . CATARACT EXTRACTION Left 06/2016  . COLPORRHAPHY N/A 09/14/2016   [facility]  . CYSTOCELE REPAIR    . FRACTURE SURGERY Left 1953   L arm  . FRACTURE SURGERY Left 1985   L ankle  . HERNIA REPAIR  2004  . JOINT REPLACEMENT Right 2012   R TKA  . JOINT REPLACEMENT Left 2017  . KIDNEY STONE SURGERY    . LAPAROSCOPIC ASSISTED VAGINAL HYSTERECTOMY N/A 09/14/2016   [facility]  . R foot/ankle Right 2013 and 2014   plate and screws lateral foot and tendon removal medial foot in 2013, revision in 2014  . RECTOCELE REPAIR    . URETERAL STENT PLACEMENT    . URETEROSCOPY      SOCIAL HISTORY: Social History   Tobacco Use  . Smoking status: Never Smoker  . Smokeless tobacco: Never Used  Substance Use Topics  . Alcohol use: Yes    Alcohol/week: 1.0 standard drinks    Types: 1 Glasses of wine per week    Comment: couple of glasses per week  . Drug use: No    FAMILY HISTORY: Family History  Problem Relation Age of Onset  . Hyperlipidemia Mother   . Heart disease Mother   . Hypertension Mother   . Stroke Mother   . Thyroid disease Mother   . Hypertension Father   . Heart disease Father     ROS: Review of Systems  Constitutional: Positive for weight loss.    Respiratory: Negative for shortness of breath.   Cardiovascular: Negative for chest pain.  Gastrointestinal: Negative for nausea and vomiting.  Musculoskeletal:       Negative for muscle weakness  Neurological: Negative for headaches.    PHYSICAL EXAM: Blood pressure (!) 142/70, pulse 69, temperature 97.7 F (36.5 C), temperature source Oral, SpO2 99 %. There is no height or weight on file to calculate BMI. Physical Exam  Constitutional: She is oriented to person, place, and time. She appears well-developed and well-nourished.  HENT:  Head: Normocephalic.  Neck: Normal range of motion.  Cardiovascular: Normal rate.  Pulmonary/Chest: Effort normal.  Musculoskeletal: Normal range of motion.  Neurological: She is alert and oriented to person, place, and time.  Skin: Skin is warm and dry.  Psychiatric: She has a normal mood and affect. Her behavior is normal.  Vitals reviewed.   RECENT LABS AND TESTS: BMET    Component Value Date/Time   NA 139 09/28/2017 0817   K 5.1 09/28/2017 0817   CL 102 09/28/2017 0817   CO2 22 09/28/2017 0817   GLUCOSE 109 (H) 09/28/2017 0817   GLUCOSE 109 (H) 05/24/2017 1023   BUN 14 09/28/2017 0817   CREATININE 0.66 09/28/2017 0817   CALCIUM 9.8 09/28/2017 0817   GFRNONAA 89 09/28/2017 0817   GFRAA 102 09/28/2017 0817   Lab Results  Component Value Date   HGBA1C 6.4 (H) 09/28/2017   HGBA1C 7.8 (H) 05/24/2017   HGBA1C 6.7 (H) 12/14/2016   HGBA1C 7.1 (H) 08/08/2016   HGBA1C 6.9 04/18/2016   Lab Results  Component Value Date   INSULIN 24.2 09/28/2017   INSULIN 17.6 06/21/2017   CBC    Component Value Date/Time   WBC 6.4 05/24/2017 1023   RBC 4.51 05/24/2017 1023   HGB 13.2 05/24/2017 1023   HCT 39.8 05/24/2017 1023   PLT 276.0 05/24/2017 1023   MCV 88.2 05/24/2017 1023   MCHC 33.2 05/24/2017 1023   RDW 16.1 (H) 05/24/2017 1023   Iron/TIBC/Ferritin/ %Sat No results found for: IRON, TIBC, FERRITIN, IRONPCTSAT Lipid Panel      Component Value Date/Time   CHOL 116 09/28/2017 0817   TRIG 153 (H) 09/28/2017 0817   HDL 42 09/28/2017 0817   CHOLHDL 2.8 09/28/2017 0817   CHOLHDL 3 12/14/2016 0856   VLDL 51.4 (H) 12/14/2016 0856   LDLCALC 43 09/28/2017 0817   LDLDIRECT 66.0 12/14/2016 0856   Hepatic Function Panel     Component Value Date/Time   PROT 6.7 09/28/2017 0817   ALBUMIN 4.8 09/28/2017 0817   AST 15 09/28/2017 0817   ALT 11 09/28/2017 0817   ALKPHOS 63 09/28/2017 0817   BILITOT 0.7 09/28/2017 0817      Component Value Date/Time   TSH 1.610 06/21/2017 1057     Ref. Range 09/28/2017 08:17  Vitamin D, 25-Hydroxy Latest Ref Range: 30.0 - 100.0 ng/mL 45.7   ASSESSMENT AND PLAN: Vitamin D deficiency - Plan: Vitamin D, Ergocalciferol, (DRISDOL) 50000 units CAPS capsule  Essential hypertension  Class 3 severe obesity with serious comorbidity and body mass index (BMI) of 40.0 to 44.9 in adult, unspecified obesity type (HCC)  PLAN: Vitamin D Deficiency Jasmin [name] was informed that low vitamin D levels contributes to fatigue and are associated with obesity, breast, and colon cancer. She agrees to continue to take prescription Vit D @50 ,000 IU every week #4 with no refills and will follow up for routine testing of vitamin D, at least 2-3 times per year. She was informed of the risk of over-replacement of vitamin D and agrees to not increase her dose unless she discusses this with [name] first. Agrees to follow up with our clinic as directed.   Hypertension We discussed sodium restriction, working on healthy weight loss, and a regular exercise program as the means to achieve improved blood pressure control. Jasmin [name] agreed with this plan and agreed to follow up as directed. We will continue to  monitor her blood pressure as well as her progress with the above lifestyle modifications. She will continue her medications as prescribed and will watch for signs of hypotension as she continues her lifestyle modifications. We  will recheck blood pressure in 3 weeks.   Obesity Jasmin [name] is currently in the action stage of change. As such, her goal is to continue with weight loss efforts She has agreed to follow the Category 2 plan Jasmin [name] has been instructed to work up to a goal of 150 minutes of combined cardio and strengthening exercise per week for weight loss and overall health benefits. We discussed the following Behavioral Modification Strategies today: increasing lean protein intake, decreasing simple carbohydrates  and emotional eating strategies   Jasmin [name] has agreed to follow up with our clinic in 3 weeks. She was informed of the importance of frequent follow up visits to maximize her success with intensive lifestyle modifications for her multiple health conditions.   OBESITY BEHAVIORAL INTERVENTION VISIT  Today's visit was # 8   Starting weight: 248 lb Starting date: 06/21/17 Today's weight : 225 lb Today's date: 11/27/17 Total lbs lost to date: 23 lb At least 15 minutes were spent on discussing the following behavioral intervention visit.   ASK: We discussed the diagnosis of obesity with Jasmin [name] today and Jasmin [name] agreed to give [name] permission to discuss obesity behavioral modification therapy today.  ASSESS: Jasmin [name] has the diagnosis of obesity and her BMI today is 42.54 Jasmin [name] is in the action stage of change   ADVISE: Jasmin [name] was educated on the multiple health risks of obesity as well as the benefit of weight loss to improve her health. She was advised of the need for long term treatment and the importance of lifestyle modifications to improve her current health and to decrease her risk of future health problems.  AGREE: Multiple dietary modification options and treatment options were discussed and  Jasmin [name] agreed to follow the recommendations documented in the above note.  ARRANGE: Jasmin [name] was educated on the importance of frequent visits to treat obesity as outlined per CMS and  USPSTF guidelines and agreed to schedule her next follow up appointment today.  I, Renee Ramus, am acting as transcriptionist for Dennard Nip, MD   I have reviewed the above documentation for accuracy and completeness, and I agree with the above. -Dennard Nip, MD

## 2017-12-11 ENCOUNTER — Encounter: Payer: Self-pay | Admitting: Medical

## 2017-12-11 ENCOUNTER — Ambulatory Visit: Payer: Medicare Other | Admitting: Medical

## 2017-12-11 ENCOUNTER — Telehealth: Payer: Self-pay

## 2017-12-11 VITALS — BP 114/42 | HR 83 | Temp 98.1°F | Resp 16 | Ht 61.0 in | Wt 230.4 lb

## 2017-12-11 DIAGNOSIS — R05 Cough: Secondary | ICD-10-CM

## 2017-12-11 DIAGNOSIS — J0100 Acute maxillary sinusitis, unspecified: Secondary | ICD-10-CM | POA: Diagnosis not present

## 2017-12-11 DIAGNOSIS — J40 Bronchitis, not specified as acute or chronic: Secondary | ICD-10-CM

## 2017-12-11 DIAGNOSIS — R059 Cough, unspecified: Secondary | ICD-10-CM

## 2017-12-11 MED ORDER — FLUTICASONE PROPIONATE 50 MCG/ACT NA SUSP: 2.0000 | Freq: Every day | NASAL | 1 refills | Status: DC

## 2017-12-11 MED ORDER — ALBUTEROL SULFATE HFA 108 (90 BASE) MCG/ACT IN AERS: 2.0000 | INHALATION_SPRAY | Freq: Four times a day (QID) | RESPIRATORY_TRACT | 1 refills | Status: DC | PRN

## 2017-12-11 MED ORDER — HYDROCODONE-HOMATROPINE 5-1.5 MG/5ML PO SYRP: 5.0000 mL | ORAL_SOLUTION | Freq: Three times a day (TID) | ORAL | 0 refills | Status: DC | PRN

## 2017-12-11 MED ORDER — DOXYCYCLINE HYCLATE 100 MG PO TABS: 100.0000 mg | ORAL_TABLET | Freq: Two times a day (BID) | ORAL | 0 refills | Status: DC

## 2017-12-11 MED FILL — HYDROCODONE-HOMATROPINE SOL: 5-1.5 | 7 days supply | Qty: 100 | Fill #0

## 2017-12-11 MED FILL — DOXYCYCLINE HYCLATE 100 MG: 100 | 10 days supply | Qty: 20 | Fill #0

## 2017-12-11 MED FILL — FLUTICASONE PROP 50 MCG SPR: 50 | 30 days supply | Qty: 16 | Fill #0

## 2017-12-11 NOTE — Progress Notes (Signed)
Subjective:    Patient ID: Jasmin [name], female    DOB: 1946-01-27, 72 y.o.   MRN: [id number]  HPI   Pt in with some st on Wednesday. She has had mild cough at onset. Pt states cough moderate to severe until Saturday. Cough was productive and she found some old rx of cough medicine. She has had delsym over the counter after she ran out of old cough med. She had some cough, chest congestion and chills. Some sinus pain.    Review of Systems  Constitutional: Positive for fatigue and fever. Negative for chills.       Subjecitve fever and chills.  HENT: Positive for congestion, postnasal drip, sinus pressure and sinus pain.   Respiratory: Positive for cough. Negative for chest tightness and wheezing.   Cardiovascular: Negative for chest pain and palpitations.  Gastrointestinal: Negative for abdominal pain.  Genitourinary: Negative for dysuria, flank pain, frequency and genital sores.  Musculoskeletal: Negative for back pain.  Skin: Negative for rash.  Neurological: Negative for dizziness, syncope, weakness and numbness.  Hematological: Negative for adenopathy. Does not bruise/bleed easily.  Psychiatric/Behavioral: Negative for behavioral problems. The patient is not hyperactive.      Past Medical History:  Diagnosis Date  . Acid reflux disease 03/09/2015  . Aortic stenosis 03/09/2015  . AR (allergic rhinitis) 03/09/2015  . Arthritis   . Back pain   . Controlled type 2 diabetes mellitus without complication, without long-term current use of insulin ([place]) 08/08/2016  . Cough   . 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 Decreased hearing   . Diabetes mellitus without complication ([place])   . Dry mouth   . Easy bruising   . Essential hypertension 08/08/2016  . Heart murmur   . 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 History of CVA (cerebrovascular accident) 08/08/2016   Seen on MRI from 08/2015  . Hyperlipidemia   . Hypertension   . 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 IBS  (irritable bowel syndrome) 07/16/2015  . Insomnia 07/16/2015  . Joint pain   . Knee pain   . Left ventricular hypertrophy 07/16/2015  . Leg cramping    right leg  . Mixed hyperlipidemia 08/08/2016  . Morbid obesity ([place]) 11/10/2015  . Nasal discharge   . Nuclear sclerotic cataract of left eye 07/07/2016  . OAB (overactive bladder) 01/20/2016  . OSA (obstructive sleep apnea) 03/09/2015  . Osteoporosis 03/09/2015  . 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 Pulmonary hypertension ([place]) 07/16/2015  . Red eyes   . Right kidney stone   . Shortness of breath on exertion   . Sleep apnea   . Stroke ([place])   . Swelling of both lower extremities   . UTI (urinary tract infection)   . Vitamin D deficiency 03/09/2015     Social History   Socioeconomic History  . Marital status: Married    Spouse name: Estellar Cadena  . Number of children: 1  . Years of education: Not on file  . Highest education level: Not on file  Occupational History  . Occupation: Retired Tour manager  . Financial resource strain: Not on file  . Food insecurity:    Worry: Not on file    Inability: Not on file  . Transportation needs:    Medical: Not on file    Non-medical: Not on file  Tobacco Use  . Smoking status: Never Smoker  . Smokeless tobacco: Never Used  Substance and Sexual Activity  . Alcohol  use: Yes    Alcohol/week: 1.0 standard drinks    Types: 1 Glasses of wine per week    Comment: couple of glasses per week  . Drug use: No  . Sexual activity: Not Currently  Lifestyle  . Physical activity:    Days per week: Not on file    Minutes per session: Not on file  . Stress: Not on file  Relationships  . Social connections:    Talks on phone: Not on file    Gets together: Not on file    Attends religious service: Not on file    Active member of club or organization: Not on file    Attends meetings of clubs or organizations: Not on file    Relationship status: Not on file  . Intimate  partner violence:    Fear of current or ex partner: Not on file    Emotionally abused: Not on file    Physically abused: Not on file    Forced sexual activity: Not on file  Other Topics Concern  . Not on file  Social History Narrative  . Not on file    Past Surgical History:  Procedure Laterality Date  . ABDOMINAL HYSTERECTOMY  09/14/2016  . CATARACT EXTRACTION Left   . CATARACT EXTRACTION Left 06/2016  . COLPORRHAPHY N/A 09/14/2016   [facility]  . CYSTOCELE REPAIR    . FRACTURE SURGERY Left 1953   L arm  . FRACTURE SURGERY Left 1985   L ankle  . HERNIA REPAIR  2004  . JOINT REPLACEMENT Right 2012   R TKA  . JOINT REPLACEMENT Left 2017  . KIDNEY STONE SURGERY    . LAPAROSCOPIC ASSISTED VAGINAL HYSTERECTOMY N/A 09/14/2016   [facility]  . R foot/ankle Right 2013 and 2014   plate and screws lateral foot and tendon removal medial foot in 2013, revision in 2014  . RECTOCELE REPAIR    . URETERAL STENT PLACEMENT    . URETEROSCOPY      Family History  Problem Relation Age of Onset  . Hyperlipidemia Mother   . Heart disease Mother   . Hypertension Mother   . Stroke Mother   . Thyroid disease Mother   . Hypertension Father   . Heart disease Father     Allergies  Allergen Reactions  . Latex Itching  . Ciprofloxacin Hives  . Metronidazole Hives  . Adhesive [Tape] Rash    Current Outpatient Medications on File Prior to Visit  Medication Sig Dispense Refill  . acetaminophen (TYLENOL) 500 MG tablet Take by mouth as needed.     [name] atorvastatin (LIPITOR) 20 MG tablet TAKE ONE (1) TABLET BY MOUTH EVERY DAY 90 tablet 0  . CALCIUM PO Take 1 tablet by mouth daily.    . Cholecalciferol (VITAMIN D3) 2000 units TABS Take by mouth. Take one tablet in the AM    . clopidogrel (PLAVIX) 75 MG tablet TAKE ONE (1) TABLET BY MOUTH EVERY DAY 90 tablet 0  . desonide (DESOWEN) 0.05 % cream Apply topically as needed.     [name] econazole nitrate 1 % cream Apply topically as needed.     . fluticasone  (FLONASE) 50 MCG/ACT nasal spray Place 1 spray into both nostrils daily as needed for allergies.     [name] lisinopril (PRINIVIL,ZESTRIL) 10 MG tablet TAKE ONE (1) TABLET EACH DAY 90 tablet 0  . metFORMIN (GLUCOPHAGE) 500 MG tablet Take 1 tablet (500 mg total) by mouth 2 (two) times daily with a meal. 180  tablet 0  . metroNIDAZOLE (METROCREAM) 0.75 % cream Apply topically 2 (two) times daily.    . mirabegron ER (MYRBETRIQ) 50 MG TB24 tablet Take 50 mg by mouth daily.     [name] omega-3 acid ethyl esters (LOVAZA) 1 g capsule Take 2 capsules (2 g total) by mouth 2 (two) times daily. 360 capsule 1  . oxyCODONE-acetaminophen (ROXICET) 5-325 MG/5ML solution Take by mouth every 4 (four) hours as needed for severe pain.    [name] PSYLLIUM PO Take by mouth.    . Vitamin D, Ergocalciferol, (DRISDOL) 50000 units CAPS capsule Take 1 capsule (50,000 Units total) by mouth every 7 (seven) days. 4 capsule 0   No current facility-administered medications on file prior to visit.     BP (!) 114/42   Pulse 83   Temp 98.1 F (36.7 C) (Oral)   Resp 16   Ht 5\' 1"  (1.549 m)   Wt 230 lb 6.4 oz (104.5 kg)   SpO2 100%   BMI 43.53 kg/m       Objective:   Physical Exam  General  Mental Status - Alert. General Appearance - Well groomed. Not in acute distress.  Skin Rashes- No Rashes.  HEENT Head- Normal. Ear Auditory Canal - Left- Normal. Right - Normal.Tympanic Membrane- Left- Normal. Right- Normal. Eye Sclera/Conjunctiva- Left- Normal. Right- Normal. Nose & Sinuses Nasal Mucosa- Left-  Boggy and Congested. Right-  Boggy and  Congested.Bilateral maxillary and frontal sinus pressure. Mouth & Throat Lips: Upper Lip- Normal: no dryness, cracking, pallor, cyanosis, or vesicular eruption. Lower Lip-Normal: no dryness, cracking, pallor, cyanosis or vesicular eruption. Buccal Mucosa- Bilateral- No Aphthous ulcers. Oropharynx- No Discharge or Erythema. Tonsils: Characteristics- Bilateral- No Erythema or Congestion.  Size/Enlargement- Bilateral- No enlargement. Discharge- bilateral-None.  Neck Neck- Supple. No Masses.   Chest and Lung Exam Auscultation: Breath Sounds:-Clear even and unlabored.  Cardiovascular Auscultation:Rythm- Regular, rate and rhythm. Murmurs & Other Heart Sounds:Ausculatation of the heart reveal- No Murmurs.  Lymphatic Head & Neck General Head & Neck Lymphatics: Bilateral: Description- No Localized lymphadenopathy.       Assessment & Plan:   You appear to have bronchitis and sinusitis(more bronchitis). Rest hydrate and tylenol for fever. I am prescribing cough medicine hycodan, and doxycyline antibiotic. For your nasal congestion rx flonase  You should gradually get better. If not then notify us and would recommend a chest xray.  Making albuterol inhaler available if needed(if were to start wheezing)  Follow up in 7-10 days or as needed  [employer], [employer]

## 2017-12-11 NOTE — Telephone Encounter (Signed)
TH follow up call made to patient. Patient still had symptoms and scheduled to see E. Saguire today.                                      [name]

## 2017-12-11 NOTE — Patient Instructions (Addendum)
You appear to have bronchitis and sinusitis(more bronchitis). Rest hydrate and tylenol for fever. I am prescribing cough medicine hycodan, and doxycyline antibiotic. For your nasal congestion rx flonase  You should gradually get better. If not then notify us and would recommend a chest xray.  Making albuterol inhaler available if needed(if were to start wheezing)  Follow up in 7-10 days or as needed

## 2017-12-14 ENCOUNTER — Other Ambulatory Visit: Payer: Self-pay | Admitting: Family Medicine

## 2017-12-14 DIAGNOSIS — Z8673 Personal history of transient ischemic attack (TIA), and cerebral infarction without residual deficits: Secondary | ICD-10-CM

## 2017-12-21 ENCOUNTER — Ambulatory Visit (INDEPENDENT_AMBULATORY_CARE_PROVIDER_SITE_OTHER): Payer: Medicare Other | Admitting: Family Medicine

## 2018-01-09 ENCOUNTER — Ambulatory Visit (INDEPENDENT_AMBULATORY_CARE_PROVIDER_SITE_OTHER): Payer: Medicare Other | Admitting: Family Medicine

## 2018-01-09 VITALS — BP 136/78 | HR 75 | Temp 98.3°F | Ht 61.0 in | Wt 234.0 lb

## 2018-01-09 DIAGNOSIS — E559 Vitamin D deficiency, unspecified: Secondary | ICD-10-CM | POA: Diagnosis not present

## 2018-01-09 DIAGNOSIS — Z6841 Body Mass Index (BMI) 40.0 and over, adult: Secondary | ICD-10-CM

## 2018-01-09 DIAGNOSIS — E119 Type 2 diabetes mellitus without complications: Secondary | ICD-10-CM | POA: Diagnosis not present

## 2018-01-09 MED ORDER — VITAMIN D (ERGOCALCIFEROL) 1.25 MG (50000 UNIT) PO CAPS: 50000.0000 [IU] | ORAL_CAPSULE | ORAL | 0 refills | Status: DC

## 2018-01-09 MED FILL — VIT D2 1.25 MG (50,000 UNIT: 1.25 MG | 28 days supply | Qty: 4 | Fill #0

## 2018-01-10 NOTE — Progress Notes (Signed)
Office: [phone]  /  Fax: [phone]   HPI:   Chief Complaint: OBESITY Jasmin [name] is here to discuss her progress with her obesity treatment plan. She is on the Category 2 plan and is following her eating plan approximately 20 to 25 % of the time. She states she is walking 6,500 to 10,000 steps daily. Jasmin [name] has been sick with multiple health issues this last month and she has not been concentrating on weight loss. Jasmin [name] is disappointed that she has gained weight and she is ready to get back on track. Her weight is 234 lb (106.1 kg) today and has had a weight gain of 9 pounds over a period of 6 weeks since her last visit. She has lost 11 lbs since starting treatment with [name].  Vitamin D deficiency Jasmin [name] has a diagnosis of vitamin D deficiency. She is stable on vit D and denies nausea, vomiting or muscle weakness.  Diabetes II Jasmin [name] has a diagnosis of diabetes type II. Jasmin [name] notes an increase in her blood sugars while she was sick recently. Normally her fasting BGs are approximately 105, but has increased up to 160. They are coming back down now and range in the 120's. Jasmin [name] denies any hypoglycemic episodes. Last A1c was at 6.4 She has been working on intensive lifestyle modifications including diet, exercise, and weight loss to help control her blood glucose levels.  ALLERGIES: Allergies  Allergen Reactions  . Latex Itching  . Ciprofloxacin Hives  . Metronidazole Hives  . Adhesive [Tape] Rash    MEDICATIONS: Current Outpatient Medications on File Prior to Visit  Medication Sig Dispense Refill  . acetaminophen (TYLENOL) 500 MG tablet Take by mouth as needed.     [name] albuterol (PROVENTIL HFA;VENTOLIN HFA) 108 (90 Base) MCG/ACT inhaler Inhale 2 puffs into the lungs every 6 (six) hours as needed for wheezing or shortness of breath. 1 Inhaler 1  . atorvastatin (LIPITOR) 20 MG tablet TAKE ONE (1) TABLET BY MOUTH EVERY DAY 90 tablet 0  . CALCIUM PO Take 1 tablet by mouth daily.     . Cholecalciferol (VITAMIN D3) 2000 units TABS Take by mouth. Take one tablet in the AM    . clopidogrel (PLAVIX) 75 MG tablet TAKE ONE (1) TABLET BY MOUTH EVERY DAY 90 tablet 1  . desonide (DESOWEN) 0.05 % cream Apply topically as needed.     . doxycycline (VIBRA-TABS) 100 MG tablet Take 1 tablet (100 mg total) by mouth 2 (two) times daily. 20 tablet 0  . econazole nitrate 1 % cream Apply topically as needed.     . fluticasone (FLONASE) 50 MCG/ACT nasal spray Place 1 spray into both nostrils daily as needed for allergies.     . fluticasone (FLONASE) 50 MCG/ACT nasal spray Place 2 sprays into both nostrils daily. 16 g 1  . HYDROcodone-homatropine (HYCODAN) 5-1.5 MG/5ML syrup Take 5 mLs by mouth every 8 (eight) hours as needed. 100 mL 0  . lisinopril (PRINIVIL,ZESTRIL) 10 MG tablet TAKE ONE (1) TABLET EACH DAY 90 tablet 0  . metFORMIN (GLUCOPHAGE) 500 MG tablet Take 1 tablet (500 mg total) by mouth 2 (two) times daily with a meal. 180 tablet 0  . metroNIDAZOLE (METROCREAM) 0.75 % cream Apply topically 2 (two) times daily.    . mirabegron ER (MYRBETRIQ) 50 MG TB24 tablet Take 50 mg by mouth daily.     [name] omega-3 acid ethyl esters (LOVAZA) 1 g capsule Take 2 capsules (2 g total) by mouth 2 (two) times daily. [place]  capsule 1  . oxyCODONE-acetaminophen (ROXICET) 5-325 MG/5ML solution Take by mouth every 4 (four) hours as needed for severe pain.    [name] PSYLLIUM PO Take by mouth.     No current facility-administered medications on file prior to visit.     PAST MEDICAL HISTORY: Past Medical History:  Diagnosis Date  . Acid reflux disease 03/09/2015  . Aortic stenosis 03/09/2015  . AR (allergic rhinitis) 03/09/2015  . Arthritis   . Back pain   . Controlled type 2 diabetes mellitus without complication, without long-term current use of insulin ([place]) 08/08/2016  . Cough   . 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 Decreased hearing   . Diabetes mellitus without complication ([place])   . Dry mouth   . Easy  bruising   . Essential hypertension 08/08/2016  . Heart murmur   . 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 History of CVA (cerebrovascular accident) 08/08/2016   Seen on MRI from 08/2015  . Hyperlipidemia   . Hypertension   . 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 IBS (irritable bowel syndrome) 07/16/2015  . Insomnia 07/16/2015  . Joint pain   . Knee pain   . Left ventricular hypertrophy 07/16/2015  . Leg cramping    right leg  . Mixed hyperlipidemia 08/08/2016  . Morbid obesity ([place]) 11/10/2015  . Nasal discharge   . Nuclear sclerotic cataract of left eye 07/07/2016  . OAB (overactive bladder) 01/20/2016  . OSA (obstructive sleep apnea) 03/09/2015  . Osteoporosis 03/09/2015  . 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 Pulmonary hypertension ([place]) 07/16/2015  . Red eyes   . Right kidney stone   . Shortness of breath on exertion   . Sleep apnea   . Stroke ([place])   . Swelling of both lower extremities   . UTI (urinary tract infection)   . Vitamin D deficiency 03/09/2015    PAST SURGICAL HISTORY: Past Surgical History:  Procedure Laterality Date  . ABDOMINAL HYSTERECTOMY  09/14/2016  . CATARACT EXTRACTION Left   . CATARACT EXTRACTION Left 06/2016  . COLPORRHAPHY N/A 09/14/2016   [facility]  . CYSTOCELE REPAIR    . FRACTURE SURGERY Left 1953   L arm  . FRACTURE SURGERY Left 1985   L ankle  . HERNIA REPAIR  2004  . JOINT REPLACEMENT Right 2012   R TKA  . JOINT REPLACEMENT Left 2017  . KIDNEY STONE SURGERY    . LAPAROSCOPIC ASSISTED VAGINAL HYSTERECTOMY N/A 09/14/2016   [facility]  . R foot/ankle Right 2013 and 2014   plate and screws lateral foot and tendon removal medial foot in 2013, revision in 2014  . RECTOCELE REPAIR    . URETERAL STENT PLACEMENT    . URETEROSCOPY      SOCIAL HISTORY: Social History   Tobacco Use  . Smoking status: Never Smoker  . Smokeless tobacco: Never Used  Substance Use Topics  . Alcohol use: Yes    Alcohol/week: 1.0 standard drinks     Types: 1 Glasses of wine per week    Comment: couple of glasses per week  . Drug use: No    FAMILY HISTORY: Family History  Problem Relation Age of Onset  . Hyperlipidemia Mother   . Heart disease Mother   . Hypertension Mother   . Stroke Mother   . Thyroid disease Mother   . Hypertension Father   . Heart disease Father     ROS: Review of Systems  Constitutional: Negative for weight loss.  Gastrointestinal:  Negative for nausea and vomiting.  Musculoskeletal:       Negative for muscle weakness    PHYSICAL EXAM: Blood pressure 136/78, pulse 75, temperature 98.3 F (36.8 C), temperature source Oral, height 5\' 1"  (1.549 m), weight 234 lb (106.1 kg), SpO2 99 %. Body mass index is 44.21 kg/m. Physical Exam  Constitutional: She is oriented to person, place, and time. She appears well-developed and well-nourished.  Cardiovascular: Normal rate.  Pulmonary/Chest: Effort normal.  Musculoskeletal: Normal range of motion.  Neurological: She is oriented to person, place, and time.  Skin: Skin is warm and dry.  Psychiatric: She has a normal mood and affect. Her behavior is normal.  Vitals reviewed.   RECENT LABS AND TESTS: BMET    Component Value Date/Time   NA 139 09/28/2017 0817   K 5.1 09/28/2017 0817   CL 102 09/28/2017 0817   CO2 22 09/28/2017 0817   GLUCOSE 109 (H) 09/28/2017 0817   GLUCOSE 109 (H) 05/24/2017 1023   BUN 14 09/28/2017 0817   CREATININE 0.66 09/28/2017 0817   CALCIUM 9.8 09/28/2017 0817   GFRNONAA 89 09/28/2017 0817   GFRAA 102 09/28/2017 0817   Lab Results  Component Value Date   HGBA1C 6.4 (H) 09/28/2017   HGBA1C 7.8 (H) 05/24/2017   HGBA1C 6.7 (H) 12/14/2016   HGBA1C 7.1 (H) 08/08/2016   HGBA1C 6.9 04/18/2016   Lab Results  Component Value Date   INSULIN 24.2 09/28/2017   INSULIN 17.6 06/21/2017   CBC    Component Value Date/Time   WBC 6.4 05/24/2017 1023   RBC 4.51 05/24/2017 1023   HGB 13.2 05/24/2017 1023   HCT 39.8 05/24/2017  1023   PLT 276.0 05/24/2017 1023   MCV 88.2 05/24/2017 1023   MCHC 33.2 05/24/2017 1023   RDW 16.1 (H) 05/24/2017 1023   Iron/TIBC/Ferritin/ %Sat No results found for: IRON, TIBC, FERRITIN, IRONPCTSAT Lipid Panel     Component Value Date/Time   CHOL 116 09/28/2017 0817   TRIG 153 (H) 09/28/2017 0817   HDL 42 09/28/2017 0817   CHOLHDL 2.8 09/28/2017 0817   CHOLHDL 3 12/14/2016 0856   VLDL 51.4 (H) 12/14/2016 0856   LDLCALC 43 09/28/2017 0817   LDLDIRECT 66.0 12/14/2016 0856   Hepatic Function Panel     Component Value Date/Time   PROT 6.7 09/28/2017 0817   ALBUMIN 4.8 09/28/2017 0817   AST 15 09/28/2017 0817   ALT 11 09/28/2017 0817   ALKPHOS 63 09/28/2017 0817   BILITOT 0.7 09/28/2017 0817      Component Value Date/Time   TSH 1.610 06/21/2017 1057   Results for JENETTA, WEASE (MRN [id number]) as of 01/10/2018 09:48  Ref. Range 09/28/2017 08:17  Vitamin D, 25-Hydroxy Latest Ref Range: 30.0 - 100.0 ng/mL 45.7   ASSESSMENT AND PLAN: Vitamin D deficiency - Plan: Vitamin D, Ergocalciferol, (DRISDOL) 1.25 MG (50000 UT) CAPS capsule  Type 2 diabetes mellitus without complication, without long-term current use of insulin (HCC)  Class 3 severe obesity with serious comorbidity and body mass index (BMI) of 40.0 to 44.9 in adult, unspecified obesity type ([place])  PLAN:  Vitamin D Deficiency Jasmin [name] was informed that low vitamin D levels contributes to fatigue and are associated with obesity, breast, and colon cancer. She agrees to continue to take prescription Vit D @50 ,000 IU every week #4 with no refills and will follow up for routine testing of vitamin D, at least 2-3 times per year. She was informed of the risk of over-replacement of  vitamin D and agrees to not increase her dose unless she discusses this with [name] first. Jasmin [name] agrees to follow up as directed.  Diabetes II Jasmin [name] has been given extensive diabetes education by myself today including ideal fasting and  post-prandial blood glucose readings, individual ideal Hgb A1c goals and hypoglycemia prevention. We discussed the importance of good blood sugar control to decrease the likelihood of diabetic complications such as nephropathy, neuropathy, limb loss, blindness, coronary artery disease, and death. We discussed the importance of intensive lifestyle modification including diet, exercise and weight loss as the first line treatment for diabetes. Jasmin [name] agrees to continue diet and metformin and follow up with our clinic in 2 weeks.  Obesity Jasmin [name] is currently in the action stage of change. As such, her goal is to continue with weight loss efforts She has agreed to change to the lower carbohydrate, vegetable and lean protein rich diet plan Jasmin [name] has been instructed to work up to a goal of 150 minutes of combined cardio and strengthening exercise per week for weight loss and overall health benefits. We discussed the following Behavioral Modification Strategies today: increasing lean protein intake, decreasing simple carbohydrates , work on meal planning and easy cooking plans and holiday eating strategies   Jasmin [name] has agreed to follow up with our clinic in 2 weeks. She was informed of the importance of frequent follow up visits to maximize her success with intensive lifestyle modifications for her multiple health conditions.   OBESITY BEHAVIORAL INTERVENTION VISIT  Today's visit was # 9   Starting weight: 248 lbs Starting date: 06/21/2017 Today's weight : 2234 lbs Today's date: 01/09/2018 Total lbs lost to date: 11 At least 15 minutes were spent on discussing the following behavioral intervention visit.   ASK: We discussed the diagnosis of obesity with Jasmin [name] today and Jasmin [name] agreed to give [name] permission to discuss obesity behavioral modification therapy today.  ASSESS: Jasmin [name] has the diagnosis of obesity and her BMI today is 44.24 Jasmin [name] is in the action stage of change    ADVISE: Jasmin [name] was educated on the multiple health risks of obesity as well as the benefit of weight loss to improve her health. She was advised of the need for long term treatment and the importance of lifestyle modifications to improve her current health and to decrease her risk of future health problems.  AGREE: Multiple dietary modification options and treatment options were discussed and  Jasmin [name] agreed to follow the recommendations documented in the above note.  ARRANGE: Jasmin [name] was educated on the importance of frequent visits to treat obesity as outlined per CMS and USPSTF guidelines and agreed to schedule her next follow up appointment today.  I, Doreene Nest, am acting as transcriptionist for Dennard Nip, MD  I have reviewed the above documentation for accuracy and completeness, and I agree with the above. -Dennard Nip, MD

## 2018-01-23 ENCOUNTER — Ambulatory Visit (INDEPENDENT_AMBULATORY_CARE_PROVIDER_SITE_OTHER): Payer: Medicare Other | Admitting: Family Medicine

## 2018-01-26 ENCOUNTER — Other Ambulatory Visit: Payer: Self-pay | Admitting: Family Medicine

## 2018-01-26 DIAGNOSIS — I10 Essential (primary) hypertension: Secondary | ICD-10-CM

## 2018-01-26 MED ORDER — LISINOPRIL 10 MG PO TABS: ORAL_TABLET | ORAL | 0 refills | Status: DC

## 2018-01-26 MED FILL — LISINOPRIL 10 MG TABS: 10 | 7 days supply | Qty: 7 | Fill #0

## 2018-01-26 MED FILL — FLUTICASONE PROP 50 MCG SPR: 50 | 30 days supply | Qty: 16 | Fill #1

## 2018-01-26 NOTE — Telephone Encounter (Signed)
Courtesy refill. Appt. 01/31/18

## 2018-01-26 NOTE — Telephone Encounter (Signed)
Copied from [place] [id number]. Topic: Quick Communication - Rx Refill/Question >> Jan 26, 2018  9:11 AM Scherrie Gerlach wrote: Medication: lisinopril (PRINIVIL,ZESTRIL) 10 MG tablet  Has the patient contacted their pharmacy? No (wants to change pharmacy is was sent to previous) Pt has appt on 12/11 but is completely out.  Would like a 90 day if that is [place]. Please call back when this is done, as pt states she has had issues in the past getting her med)  [place], [place] - [place] [phone] (Phone) [phone] (Fax)

## 2018-01-28 NOTE — Progress Notes (Addendum)
[facility] [address], [place], [place] [address] [phone]  Date:  01/31/2018   Name:  Jasmin [name]   DOB:  10/09/1945   MRN:  [id number]  PCP:  Darreld Mclean, MD    Chief Complaint: Diabetes (follow up, med refills)   History of Present Illness:  Jasmin [name] is a 72 y.o. very pleasant female patient who presents with the following:  Following up today Last seen by myself in April She was in recently and saw Percell Miller for illness, she is also attending the weight and wellness clinic She has lost several lbs and is very pleased with her progress   She has noted a runny nose recently but is otherwise feeling well  DM, HTN, hyperlipidemia, stroke, OSA, pulmonary HTN Visit with cardiology in May: 1. Nonrheumatic aortic valve stenosis   2. Essential hypertension   3. Mixed hyperlipidemia   4. Controlled type 2 diabetes mellitus without complication, without long-term current use of insulin ([place])    PLAN:    1. She is at risk for progression recheck echocardiogram.  If the gradient is moderate to severe we will plan on a repeat echocardiogram office follow-up in 6 months and should continue infectious endocarditis prophylaxis 2. Stable blood pressure target continue current treatment ACE inhibitor 3. Stable continue her statin no evidence of toxicity lipids at target 4. Stable continue metformin managed with her PCP  She also does see pulmonology  Lab Results  Component Value Date   HGBA1C 6.4 (H) 09/28/2017   Need to find out more about her stroke history. This occurred in July of 2017.  She was sitting at her computer and felt funny, had a hard time getting up from seated. This was thought to be vertigo at first, but then she had an MRI which showed CVA. Her only issue that she still has is a feeling of being off balance.  No falls and she is working on her balance as well.  She is on plavix for  this  Eye exam: she will go on 12/20.  She had a left cataract removed  Foot exam is due:  Take care of today shingrix  lipitor Vit D - checked in August, ok  plavix Estrace cream Lisinopril Metformin- BID, no issues with hypoglycemia noted  myrbetriq- she is seeing a urologist in [place].  Reports that her bladder sling "slipped" recently and she has poor bladder control.  She is not sure if she will need another operation  lovaza  Her morning sugars are generally under 125 She notes that she has loose hardware in her left knee, but they hope to avoid surgery  Patient Active Problem List   Diagnosis Date Noted  . Cough 03/17/2017  . Arthritis 12/16/2016  . UTI (urinary tract infection)   . Stroke ([place])   . Sleep apnea   . Right kidney stone   . Hypertension   . Hyperlipidemia   . Heart murmur   . Diabetes mellitus without complication ([place])   . 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 Mixed hyperlipidemia 08/08/2016  . Controlled type 2 diabetes mellitus without complication, without long-term current use of insulin ([place]) 08/08/2016  . Essential hypertension 08/08/2016  . History of CVA (cerebrovascular accident) 08/08/2016  . Nuclear sclerotic cataract of left eye 07/07/2016  . OAB (overactive bladder) 01/20/2016  . Morbid obesity ([place]) 11/10/2015  . IBS (  irritable bowel syndrome) 07/16/2015  . Insomnia 07/16/2015  . Left ventricular hypertrophy 07/16/2015  . Pulmonary hypertension ([place]) 07/16/2015  . 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 Acid reflux disease 03/09/2015  . Aortic stenosis 03/09/2015  . AR (allergic rhinitis) 03/09/2015  . 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 OSA (obstructive sleep apnea) 03/09/2015  . Osteoporosis 03/09/2015  . Vitamin D deficiency 03/09/2015    Past Medical History:  Diagnosis Date  . Acid reflux disease 03/09/2015  . Aortic stenosis 03/09/2015  . AR (allergic rhinitis)  03/09/2015  . Arthritis   . Back pain   . Controlled type 2 diabetes mellitus without complication, without long-term current use of insulin ([place]) 08/08/2016  . Cough   . 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 Decreased hearing   . Diabetes mellitus without complication ([place])   . Dry mouth   . Easy bruising   . Essential hypertension 08/08/2016  . Heart murmur   . 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 History of CVA (cerebrovascular accident) 08/08/2016   Seen on MRI from 08/2015  . Hyperlipidemia   . Hypertension   . 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 IBS (irritable bowel syndrome) 07/16/2015  . Insomnia 07/16/2015  . Joint pain   . Knee pain   . Left ventricular hypertrophy 07/16/2015  . Leg cramping    right leg  . Mixed hyperlipidemia 08/08/2016  . Morbid obesity ([place]) 11/10/2015  . Nasal discharge   . Nuclear sclerotic cataract of left eye 07/07/2016  . OAB (overactive bladder) 01/20/2016  . OSA (obstructive sleep apnea) 03/09/2015  . Osteoporosis 03/09/2015  . 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 Pulmonary hypertension ([place]) 07/16/2015  . Red eyes   . Right kidney stone   . Shortness of breath on exertion   . Sleep apnea   . Stroke ([place])   . Swelling of both lower extremities   . UTI (urinary tract infection)   . Vitamin D deficiency 03/09/2015    Past Surgical History:  Procedure Laterality Date  . ABDOMINAL HYSTERECTOMY  09/14/2016  . CATARACT EXTRACTION Left   . CATARACT EXTRACTION Left 06/2016  . COLPORRHAPHY N/A 09/14/2016   [facility]  . CYSTOCELE REPAIR    . FRACTURE SURGERY Left 1953   L arm  . FRACTURE SURGERY Left 1985   L ankle  . HERNIA REPAIR  2004  . JOINT REPLACEMENT Right 2012   R TKA  . JOINT REPLACEMENT Left 2017  . KIDNEY STONE SURGERY    . LAPAROSCOPIC ASSISTED VAGINAL HYSTERECTOMY N/A 09/14/2016   [facility]  . R foot/ankle Right 2013 and 2014   plate and screws lateral foot and tendon removal medial foot in 2013, revision in  2014  . RECTOCELE REPAIR    . URETERAL STENT PLACEMENT    . URETEROSCOPY      Social History   Tobacco Use  . Smoking status: Never Smoker  . Smokeless tobacco: Never Used  Substance Use Topics  . Alcohol use: Yes    Alcohol/week: 1.0 standard drinks    Types: 1 Glasses of wine per week    Comment: couple of glasses per week  . Drug use: No    Family History  Problem Relation Age of Onset  . Hyperlipidemia Mother   . Heart disease Mother   . Hypertension Mother   . Stroke Mother   . Thyroid disease Mother   . Hypertension Father   . Heart disease Father  Allergies  Allergen Reactions  . Latex Itching  . Ciprofloxacin Hives  . Metronidazole Hives  . Adhesive [Tape] Rash    Medication list has been reviewed and updated.  Current Outpatient Medications on File Prior to Visit  Medication Sig Dispense Refill  . acetaminophen (TYLENOL) 500 MG tablet Take by mouth as needed.     [name] amoxicillin (AMOXIL) 500 MG capsule     . atorvastatin (LIPITOR) 20 MG tablet TAKE ONE (1) TABLET BY MOUTH EVERY DAY 90 tablet 0  . CALCIUM PO Take 1 tablet by mouth daily.    . Cholecalciferol (VITAMIN D3) 2000 units TABS Take by mouth. Take one tablet in the AM    . clindamycin (CLEOCIN T) 1 % lotion     . clopidogrel (PLAVIX) 75 MG tablet TAKE ONE (1) TABLET BY MOUTH EVERY DAY 90 tablet 1  . desonide (DESOWEN) 0.05 % cream Apply topically as needed.     [name] econazole nitrate 1 % cream Apply topically as needed.     [name] estradiol (ESTRACE) 0.1 MG/GM vaginal cream Place vaginally.    . fluticasone (FLONASE) 50 MCG/ACT nasal spray Place 2 sprays into both nostrils daily. 16 g 1  . lisinopril (PRINIVIL,ZESTRIL) 10 MG tablet TAKE ONE (1) TABLET EACH DAY 7 tablet 0  . metFORMIN (GLUCOPHAGE) 500 MG tablet Take 1 tablet (500 mg total) by mouth 2 (two) times daily with a meal. 180 tablet 0  . metroNIDAZOLE (METROCREAM) 0.75 % cream Apply topically 2 (two) times daily.    . mirabegron ER (MYRBETRIQ)  50 MG TB24 tablet Take 50 mg by mouth daily.     [name] omega-3 acid ethyl esters (LOVAZA) 1 g capsule Take 2 capsules (2 g total) by mouth 2 (two) times daily. 360 capsule 1  . oxyCODONE-acetaminophen (ROXICET) 5-325 MG/5ML solution Take by mouth every 4 (four) hours as needed for severe pain.    [name] PSYLLIUM PO Take by mouth.    . Vitamin D, Ergocalciferol, (DRISDOL) 1.25 MG (50000 UT) CAPS capsule Take 1 capsule (50,000 Units total) by mouth every 7 (seven) days. 4 capsule 0  . albuterol (PROVENTIL HFA;VENTOLIN HFA) 108 (90 Base) MCG/ACT inhaler Inhale 2 puffs into the lungs every 6 (six) hours as needed for wheezing or shortness of breath. (Patient not taking: Reported on 01/31/2018) 1 Inhaler 1   No current facility-administered medications on file prior to visit.     Review of Systems:  As per HPI- otherwise negative.   Physical Examination: Vitals:   01/31/18 0831  BP: 132/70  Pulse: 87  Resp: 18  Temp: 98 F (36.7 C)  SpO2: 98%   Vitals:   01/31/18 0831  Weight: 232 lb (105.2 kg)  Height: 5\' 1"  (1.549 m)   Body mass index is 43.84 kg/m. Ideal Body Weight: Weight in (lb) to have BMI = 25: 132  GEN: WDWN, NAD, Non-toxic, A & O x 3, obese, looks well  HEENT: Atraumatic, Normocephalic. Neck supple. No masses, No LAD. Ears and Nose: No external deformity. CV: RRR, No M/G/R. No JVD. No thrill. No extra heart sounds. PULM: CTA B, no wheezes, crackles, rhonchi. No retractions. No resp. distress. No accessory muscle use. ABD: S, NT, ND, +BS. No rebound. No HSM. EXTR: No c/c/e NEURO Normal gait.  PSYCH: Normally interactive. Conversant. Not depressed or anxious appearing.  Calm demeanor.  Foot exam done today   Wt Readings from Last 3 Encounters:  01/31/18 232 lb (105.2 kg)  01/09/18 234 lb (106.1  kg)  12/11/17 230 lb 6.4 oz (104.5 kg)     Assessment and Plan: Non-seasonal allergic rhinitis, unspecified trigger - Plan: fluticasone (FLONASE) 50 MCG/ACT nasal  spray  Essential hypertension - Plan: lisinopril (PRINIVIL,ZESTRIL) 10 MG tablet  Controlled type 2 diabetes mellitus without complication, without long-term current use of insulin ([place]) - Plan: metFORMIN (GLUCOPHAGE) 500 MG tablet, Hemoglobin A1c  Mixed hyperlipidemia  Following up today.  She is overall doing well.  Refilled medications as above, she will let me know when the rest her refills are due.  She is in the process of switching her prescriptions to the med center pharmacy. We will be in touch with patient pending her A1c, so we can see how well her diabetes control.  Discussed Shingrix vaccine, and encouraged her to have this done at her local drugstore at her convenience. She is attending weight and wellness clinic, encouraged her to keep up her good efforts.  Signed Lamar Blinks, MD  Received her A1c, message to pt  Results for orders placed or performed in visit on 01/31/18  Hemoglobin A1c  Result Value Ref Range   Hgb A1c MFr Bld 6.7 (H) 4.6 - 6.5 %

## 2018-01-31 ENCOUNTER — Encounter: Payer: Self-pay | Admitting: Family Medicine

## 2018-01-31 ENCOUNTER — Ambulatory Visit: Payer: Medicare Other | Admitting: Family Medicine

## 2018-01-31 VITALS — BP 132/70 | HR 87 | Temp 98.0°F | Resp 18 | Ht 61.0 in | Wt 232.0 lb

## 2018-01-31 DIAGNOSIS — I10 Essential (primary) hypertension: Secondary | ICD-10-CM

## 2018-01-31 DIAGNOSIS — E782 Mixed hyperlipidemia: Secondary | ICD-10-CM | POA: Diagnosis not present

## 2018-01-31 DIAGNOSIS — J3089 Other allergic rhinitis: Secondary | ICD-10-CM

## 2018-01-31 DIAGNOSIS — Z6841 Body Mass Index (BMI) 40.0 and over, adult: Secondary | ICD-10-CM

## 2018-01-31 DIAGNOSIS — E119 Type 2 diabetes mellitus without complications: Secondary | ICD-10-CM

## 2018-01-31 LAB — HEMOGLOBIN A1C: Hgb A1c MFr Bld: 6.7 % — ABNORMAL HIGH (ref 4.6–6.5)

## 2018-01-31 MED ORDER — LISINOPRIL 10 MG PO TABS: ORAL_TABLET | ORAL | 3 refills | Status: DC

## 2018-01-31 MED ORDER — METFORMIN HCL 500 MG PO TABS: 500.0000 mg | ORAL_TABLET | Freq: Two times a day (BID) | ORAL | 3 refills | Status: DC

## 2018-01-31 MED ORDER — FLUTICASONE PROPIONATE 50 MCG/ACT NA SUSP: 2.0000 | Freq: Every day | NASAL | 3 refills | Status: DC

## 2018-01-31 MED FILL — metFORMIN HCL 500 MG TABS: 500 | 90 days supply | Qty: 180 | Fill #0

## 2018-01-31 MED FILL — LISINOPRIL 10 MG TABS: 10 | 90 days supply | Qty: 90 | Fill #0

## 2018-01-31 NOTE — Patient Instructions (Signed)
It was good to see you today.  Happy holidays exhalation point [place] job with weight loss so far, keep up your good efforts. Let me know if you need any further refills sent to the pharmacy here. I will be in touch with your A1c when it comes in. You might consider having the Shingrix vaccine for shingles done at your pharmacy. However, this is not offered downstairs, so you may need to visit a [facility] or [facility].

## 2018-02-01 ENCOUNTER — Ambulatory Visit (INDEPENDENT_AMBULATORY_CARE_PROVIDER_SITE_OTHER): Payer: Medicare Other | Admitting: Family Medicine

## 2018-02-01 ENCOUNTER — Encounter (INDEPENDENT_AMBULATORY_CARE_PROVIDER_SITE_OTHER): Payer: Self-pay | Admitting: Family Medicine

## 2018-02-01 VITALS — BP 120/60 | HR 67 | Temp 97.6°F | Ht 61.0 in | Wt 228.0 lb

## 2018-02-01 DIAGNOSIS — E119 Type 2 diabetes mellitus without complications: Secondary | ICD-10-CM

## 2018-02-01 DIAGNOSIS — E7849 Other hyperlipidemia: Secondary | ICD-10-CM

## 2018-02-01 DIAGNOSIS — Z6841 Body Mass Index (BMI) 40.0 and over, adult: Secondary | ICD-10-CM

## 2018-02-01 DIAGNOSIS — E559 Vitamin D deficiency, unspecified: Secondary | ICD-10-CM | POA: Diagnosis not present

## 2018-02-01 MED ORDER — VITAMIN D (ERGOCALCIFEROL) 1.25 MG (50000 UNIT) PO CAPS: 50000.0000 [IU] | ORAL_CAPSULE | ORAL | 0 refills | Status: DC

## 2018-02-01 MED FILL — VIT D2 1.25 MG (50,000 UNIT: 1.25 MG | 28 days supply | Qty: 4 | Fill #0

## 2018-02-01 NOTE — Progress Notes (Signed)
Office: [phone]  /  Fax: [phone]   HPI:   Chief Complaint: OBESITY Jasmin [name] is here to discuss her progress with her obesity treatment plan. She is on the lower carbohydrate, vegetable and lean protein rich diet plan and is following her eating plan approximately 70 % of the time. She states she is exercising 0 minutes 0 times per week. Jasmin [name] does not like bread. She misses eating fruit.  Her weight is 228 lb (103.4 kg) today and has had a weight loss of 6 pounds over a period of 3 weeks since her last visit. She has lost 20 lbs since starting treatment with [name].  Vitamin D deficiency Jasmin [name] has a diagnosis of vitamin D deficiency. She is currently taking vit D and is almost at goal. Her last vitamin D level was 45.7 on 09/28/17. She denies nausea, vomiting, or muscle weakness.  Diabetes II Jasmin [name] has a diagnosis of diabetes type II. Jasmin [name] states that her fasting BGs range between 90 and 138 and her 2 hour post prandial sugars are in the 110's. She is on metformin and a statin and denies any hypoglycemic episodes. Last A1c was 6.7 on 01/31/18. She has been working on intensive lifestyle modifications including diet, exercise, and weight loss to help control her blood glucose levels.  Hyperlipidemia Jasmin [name] has hyperlipidemia and has been trying to improve her cholesterol levels with intensive lifestyle modification including a low saturated fat diet, exercise and weight loss. She is on atorvastatin 20mg . Her last triglyceride level was elevated at 153 and her LDL was at goal and was 43 on 09/28/17. She denies any chest pain, shortness of breath, or myalgias.  ALLERGIES: Allergies  Allergen Reactions  . Latex Itching  . Ciprofloxacin Hives  . Metronidazole Hives  . Adhesive [Tape] Rash    MEDICATIONS: Current Outpatient Medications on File Prior to Visit  Medication Sig Dispense Refill  . acetaminophen (TYLENOL) 500 MG tablet Take by mouth as needed.     [name] albuterol  (PROVENTIL HFA;VENTOLIN HFA) 108 (90 Base) MCG/ACT inhaler Inhale 2 puffs into the lungs every 6 (six) hours as needed for wheezing or shortness of breath. 1 Inhaler 1  . amoxicillin (AMOXIL) 500 MG capsule     . atorvastatin (LIPITOR) 20 MG tablet TAKE ONE (1) TABLET BY MOUTH EVERY DAY 90 tablet 0  . CALCIUM PO Take 1 tablet by mouth daily.    . Cholecalciferol (VITAMIN D3) 2000 units TABS Take by mouth. Take one tablet in the AM    . clindamycin (CLEOCIN T) 1 % lotion     . clopidogrel (PLAVIX) 75 MG tablet TAKE ONE (1) TABLET BY MOUTH EVERY DAY 90 tablet 1  . desonide (DESOWEN) 0.05 % cream Apply topically as needed.     [name] econazole nitrate 1 % cream Apply topically as needed.     [name] estradiol (ESTRACE) 0.1 MG/GM vaginal cream Place vaginally.    . fluticasone (FLONASE) 50 MCG/ACT nasal spray Place 2 sprays into both nostrils daily. 48 g 3  . lisinopril (PRINIVIL,ZESTRIL) 10 MG tablet TAKE ONE (1) TABLET EACH DAY 90 tablet 3  . metFORMIN (GLUCOPHAGE) 500 MG tablet Take 1 tablet (500 mg total) by mouth 2 (two) times daily with a meal. 180 tablet 3  . metroNIDAZOLE (METROCREAM) 0.75 % cream Apply topically 2 (two) times daily.    . mirabegron ER (MYRBETRIQ) 50 MG TB24 tablet Take 50 mg by mouth daily.     [name] omega-3 acid ethyl esters (LOVAZA) 1  g capsule Take 2 capsules (2 g total) by mouth 2 (two) times daily. 360 capsule 1  . oxyCODONE-acetaminophen (ROXICET) 5-325 MG/5ML solution Take by mouth every 4 (four) hours as needed for severe pain.    [name] PSYLLIUM PO Take by mouth.     No current facility-administered medications on file prior to visit.     PAST MEDICAL HISTORY: Past Medical History:  Diagnosis Date  . Acid reflux disease 03/09/2015  . Aortic stenosis 03/09/2015  . AR (allergic rhinitis) 03/09/2015  . Arthritis   . Back pain   . Controlled type 2 diabetes mellitus without complication, without long-term current use of insulin ([place]) 08/08/2016  . Cough   . Cystocele with rectocele  08/21/2016  . Decreased hearing   . Diabetes mellitus without complication ([place])   . Dry mouth   . Easy bruising   . Essential hypertension 08/08/2016  . Heart murmur   . 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 History of CVA (cerebrovascular accident) 08/08/2016   Seen on MRI from 08/2015  . Hyperlipidemia   . Hypertension   . 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 IBS (irritable bowel syndrome) 07/16/2015  . Insomnia 07/16/2015  . Joint pain   . Knee pain   . Left ventricular hypertrophy 07/16/2015  . Leg cramping    right leg  . Mixed hyperlipidemia 08/08/2016  . Morbid obesity ([place]) 11/10/2015  . Nasal discharge   . Nuclear sclerotic cataract of left eye 07/07/2016  . OAB (overactive bladder) 01/20/2016  . OSA (obstructive sleep apnea) 03/09/2015  . Osteoporosis 03/09/2015  . 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 Pulmonary hypertension ([place]) 07/16/2015  . Red eyes   . Right kidney stone   . Shortness of breath on exertion   . Sleep apnea   . Stroke ([place])   . Swelling of both lower extremities   . UTI (urinary tract infection)   . Vitamin D deficiency 03/09/2015    PAST SURGICAL HISTORY: Past Surgical History:  Procedure Laterality Date  . ABDOMINAL HYSTERECTOMY  09/14/2016  . CATARACT EXTRACTION Left   . CATARACT EXTRACTION Left 06/2016  . COLPORRHAPHY N/A 09/14/2016   [facility]  . CYSTOCELE REPAIR    . FRACTURE SURGERY Left 1953   L arm  . FRACTURE SURGERY Left 1985   L ankle  . HERNIA REPAIR  2004  . JOINT REPLACEMENT Right 2012   R TKA  . JOINT REPLACEMENT Left 2017  . KIDNEY STONE SURGERY    . LAPAROSCOPIC ASSISTED VAGINAL HYSTERECTOMY N/A 09/14/2016   [facility]  . R foot/ankle Right 2013 and 2014   plate and screws lateral foot and tendon removal medial foot in 2013, revision in 2014  . RECTOCELE REPAIR    . URETERAL STENT PLACEMENT    . URETEROSCOPY      SOCIAL HISTORY: Social History   Tobacco Use  . Smoking status: Never Smoker  . Smokeless  tobacco: Never Used  Substance Use Topics  . Alcohol use: Yes    Alcohol/week: 1.0 standard drinks    Types: 1 Glasses of wine per week    Comment: couple of glasses per week  . Drug use: No    FAMILY HISTORY: Family History  Problem Relation Age of Onset  . Hyperlipidemia Mother   . Heart disease Mother   . Hypertension Mother   . Stroke Mother   . Thyroid disease Mother   . Hypertension Father   . Heart disease Father  ROS: Review of Systems  Constitutional: Positive for weight loss.  Respiratory: Negative for shortness of breath.   Cardiovascular: Negative for chest pain.  Gastrointestinal: Negative for nausea and vomiting.  Musculoskeletal: Negative for myalgias.       Negative for muscle weakness.  Endo/Heme/Allergies:       Negative for hypoglycemia.    PHYSICAL EXAM: Blood pressure 120/60, pulse 67, temperature 97.6 F (36.4 C), temperature source Oral, height 5\' 1"  (1.549 m), weight 228 lb (103.4 kg), SpO2 99 %. Body mass index is 43.08 kg/m. Physical Exam Vitals signs reviewed.  Constitutional:      Appearance: Normal appearance. She is obese.  Cardiovascular:     Rate and Rhythm: Normal rate.  Pulmonary:     Effort: Pulmonary effort is normal.  Musculoskeletal: Normal range of motion.  Skin:    General: Skin is warm and dry.  Neurological:     Mental Status: She is alert and oriented to person, place, and time.  Psychiatric:        Mood and Affect: Mood normal.        Behavior: Behavior normal.     RECENT LABS AND TESTS: BMET    Component Value Date/Time   NA 139 09/28/2017 0817   K 5.1 09/28/2017 0817   CL 102 09/28/2017 0817   CO2 22 09/28/2017 0817   GLUCOSE 109 (H) 09/28/2017 0817   GLUCOSE 109 (H) 05/24/2017 1023   BUN 14 09/28/2017 0817   CREATININE 0.66 09/28/2017 0817   CALCIUM 9.8 09/28/2017 0817   GFRNONAA 89 09/28/2017 0817   GFRAA 102 09/28/2017 0817   Lab Results  Component Value Date   HGBA1C 6.7 (H) 01/31/2018    HGBA1C 6.4 (H) 09/28/2017   HGBA1C 7.8 (H) 05/24/2017   HGBA1C 6.7 (H) 12/14/2016   HGBA1C 7.1 (H) 08/08/2016   Lab Results  Component Value Date   INSULIN 24.2 09/28/2017   INSULIN 17.6 06/21/2017   CBC    Component Value Date/Time   WBC 6.4 05/24/2017 1023   RBC 4.51 05/24/2017 1023   HGB 13.2 05/24/2017 1023   HCT 39.8 05/24/2017 1023   PLT 276.0 05/24/2017 1023   MCV 88.2 05/24/2017 1023   MCHC 33.2 05/24/2017 1023   RDW 16.1 (H) 05/24/2017 1023   Iron/TIBC/Ferritin/ %Sat No results found for: IRON, TIBC, FERRITIN, IRONPCTSAT Lipid Panel     Component Value Date/Time   CHOL 116 09/28/2017 0817   TRIG 153 (H) 09/28/2017 0817   HDL 42 09/28/2017 0817   CHOLHDL 2.8 09/28/2017 0817   CHOLHDL 3 12/14/2016 0856   VLDL 51.4 (H) 12/14/2016 0856   LDLCALC 43 09/28/2017 0817   LDLDIRECT 66.0 12/14/2016 0856   Hepatic Function Panel     Component Value Date/Time   PROT 6.7 09/28/2017 0817   ALBUMIN 4.8 09/28/2017 0817   AST 15 09/28/2017 0817   ALT 11 09/28/2017 0817   ALKPHOS 63 09/28/2017 0817   BILITOT 0.7 09/28/2017 0817      Component Value Date/Time   TSH 1.610 06/21/2017 1057   Results for ADREANA, COULL (MRN [id number]) as of 02/01/2018 15:07  Ref. Range 09/28/2017 08:17  Vitamin D, 25-Hydroxy Latest Ref Range: 30.0 - 100.0 ng/mL 45.7   ASSESSMENT AND PLAN: Vitamin D deficiency - Plan: VITAMIN D 25 Hydroxy (Vit-D Deficiency, Fractures), Vitamin D, Ergocalciferol, (DRISDOL) 1.25 MG (50000 UT) CAPS capsule  Type 2 diabetes mellitus without complication, without long-term current use of insulin (HCC) - Plan: Comprehensive metabolic panel  Other hyperlipidemia - Plan: Lipid Panel With LDL/HDL Ratio  Class 3 severe obesity with serious comorbidity and body mass index (BMI) of 40.0 to 44.9 in adult, unspecified obesity type (HCC)  PLAN:  Vitamin D Deficiency Jasmin [name] was informed that low vitamin D levels contributes to fatigue and are associated with  obesity, breast, and colon cancer. She agrees to continue to take prescription Vit D @50 ,000 IU every week #4 with no refills and will follow up for routine testing of vitamin D, at least 2-3 times per year. She was informed of the risk of over-replacement of vitamin D and agrees to not increase her dose unless she discusses this with [name] first. We will check her vitamin D level today and she agrees to follow up in 3 weeks.  Diabetes II Jasmin [name] has been given extensive diabetes education by myself today including ideal fasting and post-prandial blood glucose readings, individual ideal Hgb A1c goals, and hypoglycemia prevention. We discussed the importance of good blood sugar control to decrease the likelihood of diabetic complications such as nephropathy, neuropathy, limb loss, blindness, coronary artery disease, and death. We discussed the importance of intensive lifestyle modification including diet, exercise and weight loss as the first line treatment for diabetes. Jasmin [name] agrees to continue her metformin and we will check a fasting glucose today. She agrees to follow up at the agreed upon time.  Hyperlipidemia Jasmin [name] was informed of the American Heart Association Guidelines emphasizing intensive lifestyle modifications as the first line treatment for hyperlipidemia. We discussed many lifestyle modifications today in depth, and Jasmin [name] will continue to work on decreasing saturated fats such as fatty red meat, butter and many fried foods. She will also increase vegetables and lean protein in her diet and continue to work on exercise and weight loss efforts. We will check her FLP today and Tanita agrees to continue atorvastatin. She agrees to follow up as directed.  Obesity Jasmin [name] is currently in the action stage of change. As such, her goal is to continue with weight loss efforts. She has agreed to switch back to the Category 2 plan for the next few weeks. We discussed substitutions for  bread. Jasmin [name] has been instructed to work up to no weight bearing exercises now because of Left leg tendon problem. She may do hand weights. We discussed the following Behavioral Modification Strategies today: holiday eating strategies and planning for success.  Jasmin [name] has agreed to follow up with our clinic in 3 weeks. She was informed of the importance of frequent follow up visits to maximize her success with intensive lifestyle modifications for her multiple health conditions.   OBESITY BEHAVIORAL INTERVENTION VISIT  Today's visit was # 10   Starting weight: 248 lbs Starting date: 06/21/17 Today's weight : Weight: 228 lb (103.4 kg)  Today's date: 02/01/2018 Total lbs lost to date: 20 At least 15 minutes were spent on discussing the following behavioral intervention visit.  ASK: We discussed the diagnosis of obesity with Jasmin [name] today and Jasmin [name] agreed to give [name] permission to discuss obesity behavioral modification therapy today.  ASSESS: Jasmin [name] has the diagnosis of obesity and her BMI today is 43.1. Jasmin [name] is in the action stage of change.   ADVISE: Jasmin [name] was educated on the multiple health risks of obesity as well as the benefit of weight loss to improve her health. She was advised of the need for long term treatment and the importance of lifestyle modifications to improve her current health and to decrease her risk of future health  problems.  AGREE: Multiple dietary modification options and treatment options were discussed and Jasmin [name] agreed to follow the recommendations documented in the above note.  ARRANGE: Jasmin [name] was educated on the importance of frequent visits to treat obesity as outlined per CMS and USPSTF guidelines and agreed to schedule her next follow up appointment today.  I, Jasmin [name], am acting as Location manager for [employer], FNP-C.  I have reviewed the above documentation for accuracy and completeness, and I agree with the above.  -  Jasmin Audia, FNP-C.

## 2018-02-02 LAB — SPECIMEN STATUS

## 2018-02-05 ENCOUNTER — Encounter (INDEPENDENT_AMBULATORY_CARE_PROVIDER_SITE_OTHER): Payer: Self-pay | Admitting: Family Medicine

## 2018-02-06 LAB — COMPREHENSIVE METABOLIC PANEL
ALT: 6 IU/L (ref 0–32)
AST: 13 IU/L (ref 0–40)
Albumin/Globulin Ratio: 1.8 (ref 1.2–2.2)
Albumin: 4.4 g/dL (ref 3.5–4.8)
Alkaline Phosphatase: 63 IU/L (ref 39–117)
BUN/Creatinine Ratio: 23 (ref 12–28)
BUN: 13 mg/dL (ref 8–27)
Bilirubin Total: 0.9 mg/dL (ref 0.0–1.2)
CO2: 23 mmol/L (ref 20–29)
Calcium: 8.8 mg/dL (ref 8.7–10.3)
Chloride: 104 mmol/L (ref 96–106)
Creatinine, Ser: 0.57 mg/dL (ref 0.57–1.00)
GFR calc Af Amer: 107 mL/min/{1.73_m2} (ref >59)
GFR calc non Af Amer: 93 mL/min/{1.73_m2} (ref >59)
Globulin, Total: 2.4 g/dL (ref 1.5–4.5)
Glucose: 128 mg/dL — ABNORMAL HIGH (ref 65–99)
Potassium: 4.8 mmol/L (ref 3.5–5.2)
Sodium: 143 mmol/L (ref 134–144)
Total Protein: 6.8 g/dL (ref 6.0–8.5)

## 2018-02-06 LAB — LIPID PANEL WITH LDL/HDL RATIO
Cholesterol, Total: 148 mg/dL (ref 100–199)
HDL: 51 mg/dL (ref >39)
LDL Calculated: 74 mg/dL (ref 0–99)
LDl/HDL Ratio: 1.5 ratio (ref 0.0–3.2)
Triglycerides: 114 mg/dL (ref 0–149)
VLDL Cholesterol Cal: 23 mg/dL (ref 5–40)

## 2018-02-06 LAB — SPECIMEN STATUS REPORT

## 2018-02-06 LAB — VITAMIN D 25 HYDROXY (VIT D DEFICIENCY, FRACTURES): Vit D, 25-Hydroxy: 40.4 ng/mL (ref 30.0–100.0)

## 2018-02-08 MED FILL — CEPHALEXIN 500 MG CAPSULE: 500 | 36 days supply | Qty: 50 | Fill #0

## 2018-02-09 DIAGNOSIS — Z7984 Long term (current) use of oral hypoglycemic drugs: Secondary | ICD-10-CM | POA: Insufficient documentation

## 2018-02-09 DIAGNOSIS — H43813 Vitreous degeneration, bilateral: Secondary | ICD-10-CM | POA: Insufficient documentation

## 2018-02-09 DIAGNOSIS — Z961 Presence of intraocular lens: Secondary | ICD-10-CM | POA: Insufficient documentation

## 2018-02-09 DIAGNOSIS — H52203 Unspecified astigmatism, bilateral: Secondary | ICD-10-CM | POA: Insufficient documentation

## 2018-02-09 DIAGNOSIS — H524 Presbyopia: Secondary | ICD-10-CM | POA: Insufficient documentation

## 2018-02-09 DIAGNOSIS — H26492 Other secondary cataract, left eye: Secondary | ICD-10-CM | POA: Insufficient documentation

## 2018-02-22 ENCOUNTER — Other Ambulatory Visit: Payer: Self-pay | Admitting: Family Medicine

## 2018-02-22 DIAGNOSIS — E782 Mixed hyperlipidemia: Secondary | ICD-10-CM

## 2018-02-22 DIAGNOSIS — Z8673 Personal history of transient ischemic attack (TIA), and cerebral infarction without residual deficits: Secondary | ICD-10-CM

## 2018-02-22 MED ORDER — OMEGA-3-ACID ETHYL ESTERS 1 G PO CAPS: 2.0000 | ORAL_CAPSULE | Freq: Two times a day (BID) | ORAL | 1 refills | Status: DC

## 2018-02-22 MED ORDER — ATORVASTATIN CALCIUM 20 MG PO TABS
ORAL_TABLET | ORAL | 0 refills | Status: DC
Start: 2018-02-22 — End: 2018-05-28

## 2018-02-22 MED ORDER — CLOPIDOGREL BISULFATE 75 MG PO TABS: ORAL_TABLET | ORAL | 1 refills | Status: DC

## 2018-02-22 MED FILL — CLOPIDOGREL 75 MG TABLET: 75 | 90 days supply | Qty: 90 | Fill #0

## 2018-02-22 MED FILL — ATORVASTATIN CALCIUM 20 MG: 20 | 90 days supply | Qty: 90 | Fill #0

## 2018-02-22 MED FILL — OMEGA-3 ETHYL ESTER 1 GM CA: 1 | 90 days supply | Qty: 360 | Fill #0

## 2018-02-22 NOTE — Telephone Encounter (Signed)
Copied from [place] [id number]. Topic: Quick Communication - Rx Refill/Question >> Feb 22, 2018  9:56 AM Robina Ade, Helene Kelp D wrote: Medication: omega-3 acid ethyl esters (LOVAZA) 1 g capsule,atorvastatin (LIPITOR) 20 MG tablet, clopidogrel (PLAVIX) 75 MG tablet  Has the patient contacted their pharmacy? No because its a new transfer. (Agent: If no, request that the patient contact the pharmacy for the refill.) (Agent: If yes, when and what did the pharmacy advise?)  Preferred Pharmacy (with phone number or street name): [place], [place]  Agent: Please be advised that RX refills may take up to 3 business days. We ask that you follow-up with your pharmacy.

## 2018-03-01 ENCOUNTER — Encounter (INDEPENDENT_AMBULATORY_CARE_PROVIDER_SITE_OTHER): Payer: Self-pay | Admitting: Family Medicine

## 2018-03-01 ENCOUNTER — Ambulatory Visit (INDEPENDENT_AMBULATORY_CARE_PROVIDER_SITE_OTHER): Payer: Medicare Other | Admitting: Family Medicine

## 2018-03-01 VITALS — BP 120/74 | HR 69 | Ht 61.0 in | Wt 236.0 lb

## 2018-03-01 DIAGNOSIS — Z6841 Body Mass Index (BMI) 40.0 and over, adult: Secondary | ICD-10-CM

## 2018-03-01 DIAGNOSIS — E559 Vitamin D deficiency, unspecified: Secondary | ICD-10-CM

## 2018-03-01 MED ORDER — VITAMIN D (ERGOCALCIFEROL) 1.25 MG (50000 UNIT) PO CAPS: 50000.0000 [IU] | ORAL_CAPSULE | ORAL | 0 refills | Status: DC

## 2018-03-01 MED FILL — VIT D2 1.25 MG (50,000 UNIT: 1.25 MG | 28 days supply | Qty: 4 | Fill #0

## 2018-03-05 NOTE — Progress Notes (Signed)
Office: [phone]  /  Fax: [phone]   HPI:   Chief Complaint: OBESITY Jasmin [name] is here to discuss her progress with her obesity treatment plan. She is on the Category 2 plan and is following her eating plan approximately 50 % of the time. She states she is walking 10,000 steps x 5 days a week. Jasmin [name] has gotten of track over the holidays with food temptations and indulging but is ready to get back on track. Her birthday is in a few days and she is worried about weight gain.  Her weight is 236 lb (107 kg) today and has had a weight gain of 8 lbs since her last visit. She has lost 12 lbs since starting treatment with [name].  Vitamin D deficiency Jasmin [name] has a diagnosis of vitamin D deficiency. She is currently taking prescription Vit D and denies nausea, vomiting or muscle weakness. Stable.  ASSESSMENT AND PLAN:  Vitamin D deficiency - Plan: Vitamin D, Ergocalciferol, (DRISDOL) 1.25 MG (50000 UT) CAPS capsule  Class 3 severe obesity with serious comorbidity and body mass index (BMI) of 40.0 to 44.9 in adult, unspecified obesity type (HCC)  PLAN:  Vitamin D Deficiency Jasmin [name] was informed that low vitamin D levels contributes to fatigue and are associated with obesity, breast, and colon cancer. She is not yet at goal. She agrees to continue to take prescription Vit D 50,000 IU every week #30 with no refills and will follow up for routine testing of vitamin D, at least 2-3 times per year. She was informed of the risk of over-replacement of vitamin D and agrees to not increase her dose unless she discusses this with [name] first. Kahmya agrees to follow up with our clinic in 2-3 weeks.  Obesity Jasmin [name] is currently in the action stage of change. As such, her goal is to continue with weight loss efforts She has agreed to follow the Category 2 plan Jasmin [name] has been instructed to work up to a goal of 150 minutes of combined cardio and strengthening exercise per week for weight loss and  overall health benefits. We discussed the following Behavioral Modification Strategies today: work on meal planning and cooking strategies, no skipping meals, celebration eating strategies Jasmin [name] has agreed to follow up with our clinic in 2-3 weeks. She was informed of the importance of frequent follow up visits to maximize her success with intensive lifestyle modifications for her multiple health conditions.  ALLERGIES: Allergies  Allergen Reactions  . Latex Itching  . Ciprofloxacin Hives  . Metronidazole Hives  . Adhesive [Tape] Rash    MEDICATIONS: Current Outpatient Medications on File Prior to Visit  Medication Sig Dispense Refill  . acetaminophen (TYLENOL) 500 MG tablet Take by mouth as needed.     Jasmin [name] albuterol (PROVENTIL HFA;VENTOLIN HFA) 108 (90 Base) MCG/ACT inhaler Inhale 2 puffs into the lungs every 6 (six) hours as needed for wheezing or shortness of breath. 1 Inhaler 1  . amoxicillin (AMOXIL) 500 MG capsule     . atorvastatin (LIPITOR) 20 MG tablet TAKE ONE (1) TABLET BY MOUTH EVERY DAY 90 tablet 0  . CALCIUM PO Take 1 tablet by mouth daily.    . Cholecalciferol (VITAMIN D3) 2000 units TABS Take by mouth. Take one tablet in the AM    . clindamycin (CLEOCIN T) 1 % lotion     . clopidogrel (PLAVIX) 75 MG tablet TAKE ONE (1) TABLET BY MOUTH EVERY DAY 90 tablet 1  . desonide (DESOWEN) 0.05 % cream Apply topically as needed.     Jasmin [name]  econazole nitrate 1 % cream Apply topically as needed.     Jasmin [name] estradiol (ESTRACE) 0.1 MG/GM vaginal cream Place vaginally.    . fluticasone (FLONASE) 50 MCG/ACT nasal spray Place 2 sprays into both nostrils daily. 48 g 3  . lisinopril (PRINIVIL,ZESTRIL) 10 MG tablet TAKE ONE (1) TABLET EACH DAY 90 tablet 3  . metFORMIN (GLUCOPHAGE) 500 MG tablet Take 1 tablet (500 mg total) by mouth 2 (two) times daily with a meal. 180 tablet 3  . metroNIDAZOLE (METROCREAM) 0.75 % cream Apply topically 2 (two) times daily.    . mirabegron ER (MYRBETRIQ) 50 MG TB24  tablet Take 50 mg by mouth daily.     Jasmin [name] omega-3 acid ethyl esters (LOVAZA) 1 g capsule Take 2 capsules (2 g total) by mouth 2 (two) times daily. 360 capsule 1  . oxyCODONE-acetaminophen (ROXICET) 5-325 MG/5ML solution Take by mouth every 4 (four) hours as needed for severe pain.    Jasmin [name] PSYLLIUM PO Take by mouth.     No current facility-administered medications on file prior to visit.     PAST MEDICAL HISTORY: Past Medical History:  Diagnosis Date  . Acid reflux disease 03/09/2015  . Aortic stenosis 03/09/2015  . AR (allergic rhinitis) 03/09/2015  . Arthritis   . Back pain   . Controlled type 2 diabetes mellitus without complication, without long-term current use of insulin ([place]) 08/08/2016  . Cough   . 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 Decreased hearing   . Diabetes mellitus without complication ([place])   . Dry mouth   . Easy bruising   . Essential hypertension 08/08/2016  . Heart murmur   . 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 History of CVA (cerebrovascular accident) 08/08/2016   Seen on MRI from 08/2015  . Hyperlipidemia   . Hypertension   . 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 IBS (irritable bowel syndrome) 07/16/2015  . Insomnia 07/16/2015  . Joint pain   . Knee pain   . Left ventricular hypertrophy 07/16/2015  . Leg cramping    right leg  . Mixed hyperlipidemia 08/08/2016  . Morbid obesity ([place]) 11/10/2015  . Nasal discharge   . Nuclear sclerotic cataract of left eye 07/07/2016  . OAB (overactive bladder) 01/20/2016  . OSA (obstructive sleep apnea) 03/09/2015  . Osteoporosis 03/09/2015  . 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 Pulmonary hypertension ([place]) 07/16/2015  . Red eyes   . Right kidney stone   . Shortness of breath on exertion   . Sleep apnea   . Stroke ([place])   . Swelling of both lower extremities   . UTI (urinary tract infection)   . Vitamin D deficiency 03/09/2015    PAST SURGICAL HISTORY: Past Surgical History:  Procedure Laterality  Date  . ABDOMINAL HYSTERECTOMY  09/14/2016  . CATARACT EXTRACTION Left   . CATARACT EXTRACTION Left 06/2016  . COLPORRHAPHY N/A 09/14/2016   [facility]  . CYSTOCELE REPAIR    . FRACTURE SURGERY Left 1953   L arm  . FRACTURE SURGERY Left 1985   L ankle  . HERNIA REPAIR  2004  . JOINT REPLACEMENT Right 2012   R TKA  . JOINT REPLACEMENT Left 2017  . KIDNEY STONE SURGERY    . LAPAROSCOPIC ASSISTED VAGINAL HYSTERECTOMY N/A 09/14/2016   [facility]  . R foot/ankle Right 2013 and 2014   plate and screws lateral foot and tendon removal medial foot in 2013, revision in 2014  . RECTOCELE REPAIR    .  URETERAL STENT PLACEMENT    . URETEROSCOPY      SOCIAL HISTORY: Social History   Tobacco Use  . Smoking status: Never Smoker  . Smokeless tobacco: Never Used  Substance Use Topics  . Alcohol use: Yes    Alcohol/week: 1.0 standard drinks    Types: 1 Glasses of wine per week    Comment: couple of glasses per week  . Drug use: No    FAMILY HISTORY: Family History  Problem Relation Age of Onset  . Hyperlipidemia Mother   . Heart disease Mother   . Hypertension Mother   . Stroke Mother   . Thyroid disease Mother   . Hypertension Father   . Heart disease Father     ROS: Review of Systems  Constitutional: Negative for weight loss.  Gastrointestinal: Negative for nausea and vomiting.  Musculoskeletal:       Negative for muscle weakness    PHYSICAL EXAM: Blood pressure 120/74, pulse 69, height 5\' 1"  (1.549 m), weight 236 lb (107 kg), SpO2 96 %. Body mass index is 44.59 kg/m. Physical Exam Vitals signs reviewed.  Constitutional:      Appearance: Normal appearance. She is obese.  Cardiovascular:     Rate and Rhythm: Normal rate.     Pulses: Normal pulses.  Pulmonary:     Effort: Pulmonary effort is normal.  Musculoskeletal: Normal range of motion.  Skin:    General: Skin is warm and dry.  Neurological:     Mental Status: She is alert and oriented to person, place, and time.    Psychiatric:        Mood and Affect: Mood normal.        Behavior: Behavior normal.     RECENT LABS AND TESTS: BMET    Component Value Date/Time   NA 143 02/01/2018 0000   K 4.8 02/01/2018 0000   CL 104 02/01/2018 0000   CO2 23 02/01/2018 0000   GLUCOSE 128 (H) 02/01/2018 0000   GLUCOSE 109 (H) 05/24/2017 1023   BUN 13 02/01/2018 0000   CREATININE 0.57 02/01/2018 0000   CALCIUM 8.8 02/01/2018 0000   GFRNONAA 93 02/01/2018 0000   GFRAA 107 02/01/2018 0000   Lab Results  Component Value Date   HGBA1C 6.7 (H) 01/31/2018   HGBA1C 6.4 (H) 09/28/2017   HGBA1C 7.8 (H) 05/24/2017   HGBA1C 6.7 (H) 12/14/2016   HGBA1C 7.1 (H) 08/08/2016   Lab Results  Component Value Date   INSULIN 24.2 09/28/2017   INSULIN 17.6 06/21/2017   CBC    Component Value Date/Time   WBC WILL FOLLOW 02/01/2018 0000   WBC 6.4 05/24/2017 1023   RBC WILL FOLLOW 02/01/2018 0000   RBC 4.51 05/24/2017 1023   HGB WILL FOLLOW 02/01/2018 0000   HCT WILL FOLLOW 02/01/2018 0000   PLT WILL FOLLOW 02/01/2018 0000   MCV WILL FOLLOW 02/01/2018 0000   MCH WILL FOLLOW 02/01/2018 0000   MCHC WILL FOLLOW 02/01/2018 0000   MCHC 33.2 05/24/2017 1023   RDW WILL FOLLOW 02/01/2018 0000   LYMPHSABS WILL FOLLOW 02/01/2018 0000   EOSABS WILL FOLLOW 02/01/2018 0000   BASOSABS WILL FOLLOW 02/01/2018 0000   Iron/TIBC/Ferritin/ %Sat No results found for: IRON, TIBC, FERRITIN, IRONPCTSAT Lipid Panel     Component Value Date/Time   CHOL 148 02/01/2018 0000   TRIG 114 02/01/2018 0000   HDL 51 02/01/2018 0000   CHOLHDL 2.8 09/28/2017 0817   CHOLHDL 3 12/14/2016 0856   VLDL 51.4 (H) 12/14/2016  0856   LDLCALC 74 02/01/2018 0000   LDLDIRECT 66.0 12/14/2016 0856   Hepatic Function Panel     Component Value Date/Time   PROT 6.8 02/01/2018 0000   ALBUMIN 4.4 02/01/2018 0000   AST 13 02/01/2018 0000   ALT 6 02/01/2018 0000   ALKPHOS 63 02/01/2018 0000   BILITOT 0.9 02/01/2018 0000      Component Value Date/Time    TSH 1.610 06/21/2017 1057     Ref. Range 02/01/2018 00:00  Vitamin D, 25-Hydroxy Latest Ref Range: 30.0 - 100.0 ng/mL 40.4     OBESITY BEHAVIORAL INTERVENTION VISIT  Today's visit was # 11   Starting weight: 248 lbs Starting date: 06/21/2017 Today's weight : 236 lb  Today's date: 03/01/2018 Total lbs lost to date: 12 At least 15 minutes were spent on discussing the following behavioral intervention visit.   ASK: We discussed the diagnosis of obesity with [name] today and Chryl agreed to give [name] permission to discuss obesity behavioral modification therapy today.  ASSESS: Breshay has the diagnosis of obesity and her BMI today is 44.61 Jayle is in the action stage of change   ADVISE: Sailor was educated on the multiple health risks of obesity as well as the benefit of weight loss to improve her health. She was advised of the need for long term treatment and the importance of lifestyle modifications to improve her current health and to decrease her risk of future health problems.  AGREE: Multiple dietary modification options and treatment options were discussed and  Jearldine agreed to follow the recommendations documented in the above note.  ARRANGE: Kanyia was educated on the importance of frequent visits to treat obesity as outlined per CMS and USPSTF guidelines and agreed to schedule her next follow up appointment today.  I, Tammy Wysor, am acting as transcriptionist for Dr. Starlyn Skeans, MD  I have reviewed the above documentation for accuracy and completeness, and I agree with the above. -Dennard Nip, MD

## 2018-03-13 MED FILL — SOLIFENACIN SUCCINATE 5 MG: 5 | 90 days supply | Qty: 90 | Fill #0

## 2018-03-13 MED FILL — CEPHALEXIN 250 MG CAPSULE: 250 | 90 days supply | Qty: 90 | Fill #0

## 2018-03-13 MED FILL — MYRBETRIQ ER 50 MG TABLET: 50 | 90 days supply | Qty: 90 | Fill #0

## 2018-03-22 ENCOUNTER — Ambulatory Visit (INDEPENDENT_AMBULATORY_CARE_PROVIDER_SITE_OTHER): Payer: Medicare Other | Admitting: Family Medicine

## 2018-04-02 MED FILL — FLUTICASONE PROP 50 MCG SPR: 50 | 90 days supply | Qty: 48 | Fill #0

## 2018-04-12 ENCOUNTER — Ambulatory Visit (INDEPENDENT_AMBULATORY_CARE_PROVIDER_SITE_OTHER): Payer: Medicare Other | Admitting: Family Medicine

## 2018-04-12 ENCOUNTER — Encounter (INDEPENDENT_AMBULATORY_CARE_PROVIDER_SITE_OTHER): Payer: Self-pay | Admitting: Family Medicine

## 2018-04-12 VITALS — BP 124/73 | HR 66 | Temp 97.7°F | Ht 61.0 in | Wt 234.0 lb

## 2018-04-12 DIAGNOSIS — Z6841 Body Mass Index (BMI) 40.0 and over, adult: Secondary | ICD-10-CM

## 2018-04-12 DIAGNOSIS — E119 Type 2 diabetes mellitus without complications: Secondary | ICD-10-CM

## 2018-04-12 DIAGNOSIS — E559 Vitamin D deficiency, unspecified: Secondary | ICD-10-CM

## 2018-04-12 MED ORDER — VITAMIN D (ERGOCALCIFEROL) 1.25 MG (50000 UNIT) PO CAPS: 50000.0000 [IU] | ORAL_CAPSULE | ORAL | 0 refills | Status: DC

## 2018-04-12 MED FILL — VIT D2 1.25 MG (50,000 UNIT: 1.25 MG | 28 days supply | Qty: 4 | Fill #0

## 2018-04-12 NOTE — Progress Notes (Signed)
Office: [phone]  /  Fax: [phone]   HPI:   Chief Complaint: OBESITY Jasmin [name] is here to discuss her progress with her obesity treatment plan. She is on the Category 2 plan and is following her eating plan approximately 50 % of the time. She states she is exercising 0 minutes 0 times per week. [name] continues to do well with weight loss on her Category 2 plan. She notes increased boredom eating and snacking on simple carbs in the last 2 weeks.  Her weight is 234 lb (106.1 kg) today and has had a weight loss of 2 pounds over a period of 6 weeks since her last visit. She has lost 14 lbs since starting treatment with [name].  Vitamin D deficiency Jasmin [name] has a diagnosis of vitamin D deficiency. She is currently stable on vit D, but is not yet at goal. She denies nausea, vomiting, or muscle weakness.  Diabetes II Jasmin [name] has a diagnosis of diabetes type II. Jasmin [name]'s last A1c was 6.7 on 01/31/18, but an A1c done by [employer] nurse states it was 6.4. She is on metformin and has been working on intensive lifestyle modifications including diet, exercise, and weight loss to help control her blood glucose levels.  ASSESSMENT AND PLAN:  Vitamin D deficiency - Plan: Vitamin D, Ergocalciferol, (DRISDOL) 1.25 MG (50000 UT) CAPS capsule  Type 2 diabetes mellitus without complication, without long-term current use of insulin (HCC)  Class 3 severe obesity with serious comorbidity and body mass index (BMI) of 40.0 to 44.9 in adult, unspecified obesity type ([place])  PLAN:  Vitamin D Deficiency Halah  was informed that low vitamin D levels contributes to fatigue and are associated with obesity, breast, and colon cancer. [name]  agrees to continue to take prescription Vit D @50 ,000 IU every week #4 with no refills and will follow up for routine testing of vitamin D, at least 2-3 times per year. She was informed of the risk of over-replacement of vitamin D and agrees to not increase her dose  unless she discusses this with [name] first. [name]  agrees to follow up in 2 to 3 weeks as directed.  Diabetes II Jasmin [name] has been given extensive diabetes education by myself today including ideal fasting and post-prandial blood glucose readings, individual ideal Hgb A1c goals, and hypoglycemia prevention. We discussed the importance of good blood sugar control to decrease the likelihood of diabetic complications such as nephropathy, neuropathy, limb loss, blindness, coronary artery disease, and death. We discussed the importance of intensive lifestyle modification including diet, exercise and weight loss as the first line treatment for diabetes. Jasmin [name] agrees to continue her diet and metformin and will follow up at the agreed upon time.  Obesity Jasmin [name] is currently in the action stage of change. As such, her goal is to continue with weight loss efforts. She has agreed to follow the Category 2 plan. Jasmin [name] has been instructed to work up to a goal of 150 minutes of combined cardio and strengthening exercise per week for weight loss and overall health benefits. We discussed the following Behavioral Modification Strategies today: increasing lean protein intake, no skipping meals, keeping healthy foods in the home, better snacking choices, and decreasing simple carbohydrates.   Jasmin [name] has agreed to follow up with our clinic in 2 to 3 weeks. She was informed of the importance of frequent follow up visits to maximize her success with intensive lifestyle modifications for her multiple health conditions.  ALLERGIES: Allergies  Allergen Reactions  . Latex Itching  .  Ciprofloxacin Hives  . Metronidazole Hives  . Adhesive [Tape] Rash    MEDICATIONS: Current Outpatient Medications on File Prior to Visit  Medication Sig Dispense Refill  . acetaminophen (TYLENOL) 500 MG tablet Take by mouth as needed.     Jasmin [name] albuterol (PROVENTIL HFA;VENTOLIN HFA) 108 (90 Base) MCG/ACT inhaler Inhale 2 puffs into the  lungs every 6 (six) hours as needed for wheezing or shortness of breath. 1 Inhaler 1  . amoxicillin (AMOXIL) 500 MG capsule     . atorvastatin (LIPITOR) 20 MG tablet TAKE ONE (1) TABLET BY MOUTH EVERY DAY 90 tablet 0  . CALCIUM PO Take 1 tablet by mouth daily.    . cephALEXin (KEFLEX) 250 MG capsule Take 250 mg by mouth daily.    . Cholecalciferol (VITAMIN D3) 2000 units TABS Take by mouth. Take one tablet in the AM    . clindamycin (CLEOCIN T) 1 % lotion     . clopidogrel (PLAVIX) 75 MG tablet TAKE ONE (1) TABLET BY MOUTH EVERY DAY 90 tablet 1  . desonide (DESOWEN) 0.05 % cream Apply topically as needed.     Jasmin [name] econazole nitrate 1 % cream Apply topically as needed.     Jasmin [name] estradiol (ESTRACE) 0.1 MG/GM vaginal cream Place vaginally.    . fluticasone (FLONASE) 50 MCG/ACT nasal spray Place 2 sprays into both nostrils daily. 48 g 3  . lisinopril (PRINIVIL,ZESTRIL) 10 MG tablet TAKE ONE (1) TABLET EACH DAY 90 tablet 3  . metFORMIN (GLUCOPHAGE) 500 MG tablet Take 1 tablet (500 mg total) by mouth 2 (two) times daily with a meal. 180 tablet 3  . metroNIDAZOLE (METROCREAM) 0.75 % cream Apply topically 2 (two) times daily.    . mirabegron ER (MYRBETRIQ) 50 MG TB24 tablet Take 50 mg by mouth daily.     Jasmin [name] omega-3 acid ethyl esters (LOVAZA) 1 g capsule Take 2 capsules (2 g total) by mouth 2 (two) times daily. 360 capsule 1  . oxyCODONE-acetaminophen (ROXICET) 5-325 MG/5ML solution Take by mouth every 4 (four) hours as needed for severe pain.    . Probiotic Product (PROBIOTIC-10 PO) Take 1 capsule by mouth daily.    . PSYLLIUM PO Take by mouth.    . solifenacin (VESICARE) 5 MG tablet Take 5 mg by mouth daily.     No current facility-administered medications on file prior to visit.     PAST MEDICAL HISTORY: Past Medical History:  Diagnosis Date  . Acid reflux disease 03/09/2015  . Aortic stenosis 03/09/2015  . AR (allergic rhinitis) 03/09/2015  . Arthritis   . Back pain   . Controlled type 2 diabetes  mellitus without complication, without long-term current use of insulin (Jasmin [name]) 08/08/2016  . Cough   . 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 Decreased hearing   . Diabetes mellitus without complication ([place])   . Dry mouth   . Easy bruising   . Essential hypertension 08/08/2016  . Heart murmur   . 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 History of CVA (cerebrovascular accident) 08/08/2016   Seen on MRI from 08/2015  . Hyperlipidemia   . Hypertension   . 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 IBS (irritable bowel syndrome) 07/16/2015  . Insomnia 07/16/2015  . Joint pain   . Knee pain   . Left ventricular hypertrophy 07/16/2015  . Leg cramping    right leg  . Mixed hyperlipidemia 08/08/2016  . Morbid obesity ([place]) 11/10/2015  . Nasal discharge   . Nuclear sclerotic cataract  of left eye 07/07/2016  . OAB (overactive bladder) 01/20/2016  . OSA (obstructive sleep apnea) 03/09/2015  . Osteoporosis 03/09/2015  . 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 Pulmonary hypertension ([place]) 07/16/2015  . Red eyes   . Right kidney stone   . Shortness of breath on exertion   . Sleep apnea   . Stroke ([place])   . Swelling of both lower extremities   . UTI (urinary tract infection)   . Vitamin D deficiency 03/09/2015    PAST SURGICAL HISTORY: Past Surgical History:  Procedure Laterality Date  . ABDOMINAL HYSTERECTOMY  09/14/2016  . CATARACT EXTRACTION Left   . CATARACT EXTRACTION Left 06/2016  . COLPORRHAPHY N/A 09/14/2016   [facility]  . CYSTOCELE REPAIR    . FRACTURE SURGERY Left 1953   L arm  . FRACTURE SURGERY Left 1985   L ankle  . HERNIA REPAIR  2004  . JOINT REPLACEMENT Right 2012   R TKA  . JOINT REPLACEMENT Left 2017  . KIDNEY STONE SURGERY    . LAPAROSCOPIC ASSISTED VAGINAL HYSTERECTOMY N/A 09/14/2016   [facility]  . R foot/ankle Right 2013 and 2014   plate and screws lateral foot and tendon removal medial foot in 2013, revision in 2014  . RECTOCELE REPAIR    . URETERAL STENT  PLACEMENT    . URETEROSCOPY      SOCIAL HISTORY: Social History   Tobacco Use  . Smoking status: Never Smoker  . Smokeless tobacco: Never Used  Substance Use Topics  . Alcohol use: Yes    Alcohol/week: 1.0 standard drinks    Types: 1 Glasses of wine per week    Comment: couple of glasses per week  . Drug use: No    FAMILY HISTORY: Family History  Problem Relation Age of Onset  . Hyperlipidemia Mother   . Heart disease Mother   . Hypertension Mother   . Stroke Mother   . Thyroid disease Mother   . Hypertension Father   . Heart disease Father     ROS: Review of Systems  Constitutional: Positive for weight loss.  Gastrointestinal: Negative for nausea and vomiting.  Musculoskeletal:       Negative for muscle weakness.    PHYSICAL EXAM: Blood pressure 124/73, pulse 66, temperature 97.7 F (36.5 C), temperature source Oral, height 5\' 1"  (1.549 m), weight 234 lb (106.1 kg), SpO2 100 %. Body mass index is 44.21 kg/m. Physical Exam Vitals signs reviewed.  Constitutional:      Appearance: Normal appearance. She is obese.  Cardiovascular:     Rate and Rhythm: Normal rate.  Pulmonary:     Effort: Pulmonary effort is normal.  Musculoskeletal: Normal range of motion.  Skin:    General: Skin is warm and dry.  Neurological:     Mental Status: She is alert and oriented to person, place, and time.  Psychiatric:        Mood and Affect: Mood normal.        Behavior: Behavior normal.     RECENT LABS AND TESTS: BMET    Component Value Date/Time   NA 143 02/01/2018 0000   K 4.8 02/01/2018 0000   CL 104 02/01/2018 0000   CO2 23 02/01/2018 0000   GLUCOSE 128 (H) 02/01/2018 0000   GLUCOSE 109 (H) 05/24/2017 1023   BUN 13 02/01/2018 0000   CREATININE 0.57 02/01/2018 0000   CALCIUM 8.8 02/01/2018 0000   GFRNONAA 93 02/01/2018 0000   GFRAA 107 02/01/2018 0000  Lab Results  Component Value Date   HGBA1C 6.7 (H) 01/31/2018   HGBA1C 6.4 (H) 09/28/2017   HGBA1C 7.8  (H) 05/24/2017   HGBA1C 6.7 (H) 12/14/2016   HGBA1C 7.1 (H) 08/08/2016   Lab Results  Component Value Date   INSULIN 24.2 09/28/2017   INSULIN 17.6 06/21/2017   CBC    Component Value Date/Time   WBC WILL FOLLOW 02/01/2018 0000   WBC 6.4 05/24/2017 1023   RBC WILL FOLLOW 02/01/2018 0000   RBC 4.51 05/24/2017 1023   HGB WILL FOLLOW 02/01/2018 0000   HCT WILL FOLLOW 02/01/2018 0000   PLT WILL FOLLOW 02/01/2018 0000   MCV WILL FOLLOW 02/01/2018 0000   MCH WILL FOLLOW 02/01/2018 0000   MCHC WILL FOLLOW 02/01/2018 0000   MCHC 33.2 05/24/2017 1023   RDW WILL FOLLOW 02/01/2018 0000   LYMPHSABS WILL FOLLOW 02/01/2018 0000   EOSABS WILL FOLLOW 02/01/2018 0000   BASOSABS WILL FOLLOW 02/01/2018 0000   Iron/TIBC/Ferritin/ %Sat No results found for: IRON, TIBC, FERRITIN, IRONPCTSAT Lipid Panel     Component Value Date/Time   CHOL 148 02/01/2018 0000   TRIG 114 02/01/2018 0000   HDL 51 02/01/2018 0000   CHOLHDL 2.8 09/28/2017 0817   CHOLHDL 3 12/14/2016 0856   VLDL 51.4 (H) 12/14/2016 0856   LDLCALC 74 02/01/2018 0000   LDLDIRECT 66.0 12/14/2016 0856   Hepatic Function Panel     Component Value Date/Time   PROT 6.8 02/01/2018 0000   ALBUMIN 4.4 02/01/2018 0000   AST 13 02/01/2018 0000   ALT 6 02/01/2018 0000   ALKPHOS 63 02/01/2018 0000   BILITOT 0.9 02/01/2018 0000      Component Value Date/Time   TSH 1.610 06/21/2017 1057   Results for JESILYN, EASOM (MRN [id number]) as of 04/12/2018 14:33  Ref. Range 02/01/2018 00:00  Vitamin D, 25-Hydroxy Latest Ref Range: 30.0 - 100.0 ng/mL 40.4    OBESITY BEHAVIORAL INTERVENTION VISIT  Today's visit was # 12   Starting weight: 248 lbs Starting date: 06/21/17 Today's weight : Weight: 234 lb (106.1 kg)  Today's date: 04/12/2018 Total lbs lost to date: 14 At least 15 minutes were spent on discussing the following behavioral intervention visit.   04/12/2018  Height 5\' 1"  (1.549 m)  Weight 234 lb (106.1 kg)  BMI (Calculated)  44.24  BLOOD PRESSURE - SYSTOLIC 092  BLOOD PRESSURE - DIASTOLIC 73   Body Fat % 33.0 %  Total Body Water (lbs) 78.2 lbs    ASK: We discussed the diagnosis of obesity with [name] today and Deyra agreed to give [name] permission to discuss obesity behavioral modification therapy today.  ASSESS: Kaydence has the diagnosis of obesity and her BMI today is 44.2. Serenity is in the action stage of change.   ADVISE: Tenleigh was educated on the multiple health risks of obesity as well as the benefit of weight loss to improve her health. She was advised of the need for long term treatment and the importance of lifestyle modifications to improve her current health and to decrease her risk of future health problems.  AGREE: Multiple dietary modification options and treatment options were discussed and Lila agreed to follow the recommendations documented in the above note.  ARRANGE: Eletha was educated on the importance of frequent visits to treat obesity as outlined per CMS and USPSTF guidelines and agreed to schedule her next follow up appointment today.  IMarcille Blanco, CMA, am acting as transcriptionist for Starlyn Skeans, MD  I have reviewed the above documentation for accuracy and completeness, and I agree with the above. -Dennard Nip, MD

## 2018-04-16 MED FILL — LISINOPRIL 10 MG TABLET: 10 | 90 days supply | Qty: 90 | Fill #1

## 2018-04-26 ENCOUNTER — Ambulatory Visit (INDEPENDENT_AMBULATORY_CARE_PROVIDER_SITE_OTHER): Payer: Medicare Other | Admitting: Family Medicine

## 2018-04-30 ENCOUNTER — Ambulatory Visit (INDEPENDENT_AMBULATORY_CARE_PROVIDER_SITE_OTHER): Payer: Medicare Other | Admitting: Family Medicine

## 2018-04-30 ENCOUNTER — Encounter (INDEPENDENT_AMBULATORY_CARE_PROVIDER_SITE_OTHER): Payer: Self-pay | Admitting: Family Medicine

## 2018-04-30 VITALS — BP 124/76 | HR 79 | Ht 61.0 in | Wt 240.0 lb

## 2018-04-30 DIAGNOSIS — Z6841 Body Mass Index (BMI) 40.0 and over, adult: Secondary | ICD-10-CM

## 2018-04-30 DIAGNOSIS — E559 Vitamin D deficiency, unspecified: Secondary | ICD-10-CM

## 2018-04-30 DIAGNOSIS — E66813 Obesity, class 3: Secondary | ICD-10-CM

## 2018-04-30 DIAGNOSIS — E119 Type 2 diabetes mellitus without complications: Secondary | ICD-10-CM

## 2018-04-30 DIAGNOSIS — F3289 Other specified depressive episodes: Secondary | ICD-10-CM

## 2018-04-30 MED ORDER — BUPROPION HCL ER (SR) 150 MG PO TB12: 150.0000 mg | ORAL_TABLET | Freq: Every day | ORAL | 0 refills | Status: DC

## 2018-04-30 MED ORDER — VITAMIN D (ERGOCALCIFEROL) 1.25 MG (50000 UNIT) PO CAPS: 50000.0000 [IU] | ORAL_CAPSULE | ORAL | 0 refills | Status: DC

## 2018-04-30 MED FILL — BUPROPION HCL ER (SR) 150 M: 150 | 30 days supply | Qty: 30 | Fill #0

## 2018-04-30 NOTE — Progress Notes (Signed)
Office: [phone]  /  Fax: [phone]   HPI:   Chief Complaint: OBESITY Jasmin [name] is here to discuss her progress with her obesity treatment plan. She is on the Category 2 plan and is following her eating plan approximately 50 % of the time. She states she is doing yard work or walking 10,000 steps 7 times per week. Jasmin [name] has gotten off track with her Category 2 plan. She has regained 15 pounds from her lowest weight with Jasmin [name] and is close to regaining all of her weight. She notes increased emotional eating and decreased motivation to make changes.  Her weight is 240 lb (108.9 kg) today and has had a weight gain of 6 pounds over a period of 3 weeks since her last visit. She has lost 8 lbs since starting treatment with Jasmin [name].  Diabetes II Jasmin [name] has a diagnosis of diabetes type II. Shanna states that she does not check sugars. Her last A1c was 6.7 on 01/31/18. Elfrieda is on metformin, but is not following the plan closely. She notes increased snacking. She has been working on intensive lifestyle modifications including diet, exercise, and weight loss to help control her blood glucose levels.  Vitamin D deficiency Jasmin [name] has a diagnosis of vitamin D deficiency. She is currently stable on vit D and denies nausea, vomiting, or muscle weakness.  Depression with emotional eating behaviors Jasmin [name] notes increased stress at home with her husband. She is struggling with emotional eating and using food for comfort more frequently to the extent that it is negatively impacting her health. She often snacks when she is not hungry. Jasmin [name] sometimes feels she is out of control and then feels guilty that she made poor food choices. She has been working on behavior modification techniques to help reduce her emotional eating and has been somewhat successful.   ASSESSMENT AND PLAN:  Type 2 diabetes mellitus without complication, without long-term current use of insulin (HCC)  Vitamin D deficiency - Plan:  Vitamin D, Ergocalciferol, (DRISDOL) 1.25 MG (50000 UT) CAPS capsule  Other depression - Plan: buPROPion (WELLBUTRIN SR) 150 MG 12 hr tablet  Class 3 severe obesity with serious comorbidity and body mass index (BMI) of 45.0 to 49.9 in adult, unspecified obesity type (HCC)  PLAN:  Diabetes II [name] has been given extensive diabetes education by myself today including ideal fasting and post-prandial blood glucose readings, individual ideal Hgb A1c goals, and hypoglycemia prevention. We discussed the importance of good blood sugar control to decrease the likelihood of diabetic complications such as nephropathy, neuropathy, limb loss, blindness, coronary artery disease, and death. We discussed the importance of intensive lifestyle modification including diet, exercise and weight loss as the first line treatment for diabetes. Jasmin [name] agrees to continue her metformin as prescribed and to get back to her diet prescription. Jasmin [name] will follow up at the agreed upon time in 2 to 3 weeks.  Vitamin D Deficiency Jasmin [name] was informed that low vitamin D levels contributes to fatigue and are associated with obesity, breast, and colon cancer. Jasmin [name] agrees to continue to take prescription Vit D @50 ,000 IU every week #4 with no refills and will follow up for routine testing of vitamin D, at least 2-3 times per year. She was informed of the risk of over-replacement of vitamin D and agrees to not increase her dose unless she discusses this with Jasmin [name] first. Jasmin [name] agrees to follow up in 2 to 3 weeks as directed.  Depression with Emotional Eating Behaviors We discussed behavior modification techniques today  to help Jasmin [name] deal with her emotional eating and depression. She has agreed to start Wellbutrin SR 150mg  qAM #30 with no refills and she agreed to follow up as directed.  Obesity Jasmin [name] is currently in the action stage of change. As such, her goal is to maintain weight for now while working on her  depression. She has agreed to follow the Category 2 plan. Jasmin [name] has been instructed to work up to a goal of 150 minutes of combined cardio and strengthening exercise per week for weight loss and overall health benefits. We discussed the following Behavioral Modification Strategies today: increasing lean protein intake, decreasing simple carbohydrates, work on meal planning and easy cooking plans, and emotional eating strategies.  Jasmin [name] has agreed to follow up with our clinic in 2 to 3 weeks. She was informed of the importance of frequent follow up visits to maximize her success with intensive lifestyle modifications for her multiple health conditions.  ALLERGIES: Allergies  Allergen Reactions  . Latex Itching  . Ciprofloxacin Hives  . Metronidazole Hives  . Adhesive [Tape] Rash    MEDICATIONS: Current Outpatient Medications on File Prior to Visit  Medication Sig Dispense Refill  . acetaminophen (TYLENOL) 500 MG tablet Take by mouth as needed.     Jasmin [name] albuterol (PROVENTIL HFA;VENTOLIN HFA) 108 (90 Base) MCG/ACT inhaler Inhale 2 puffs into the lungs every 6 (six) hours as needed for wheezing or shortness of breath. 1 Inhaler 1  . amoxicillin (AMOXIL) 500 MG capsule     . atorvastatin (LIPITOR) 20 MG tablet TAKE ONE (1) TABLET BY MOUTH EVERY DAY 90 tablet 0  . CALCIUM PO Take 1 tablet by mouth daily.    . cephALEXin (KEFLEX) 250 MG capsule Take 250 mg by mouth daily.    . Cholecalciferol (VITAMIN D3) 2000 units TABS Take by mouth. Take one tablet in the AM    . clindamycin (CLEOCIN T) 1 % lotion     . clopidogrel (PLAVIX) 75 MG tablet TAKE ONE (1) TABLET BY MOUTH EVERY DAY 90 tablet 1  . desonide (DESOWEN) 0.05 % cream Apply topically as needed.     Jasmin [name] econazole nitrate 1 % cream Apply topically as needed.     Jasmin [name] estradiol (ESTRACE) 0.1 MG/GM vaginal cream Place vaginally.    . fluticasone (FLONASE) 50 MCG/ACT nasal spray Place 2 sprays into both nostrils daily. 48 g 3  . lisinopril  (PRINIVIL,ZESTRIL) 10 MG tablet TAKE ONE (1) TABLET EACH DAY 90 tablet 3  . metFORMIN (GLUCOPHAGE) 500 MG tablet Take 1 tablet (500 mg total) by mouth 2 (two) times daily with a meal. 180 tablet 3  . metroNIDAZOLE (METROCREAM) 0.75 % cream Apply topically 2 (two) times daily.    . mirabegron ER (MYRBETRIQ) 50 MG TB24 tablet Take 50 mg by mouth daily.     Jasmin [name] omega-3 acid ethyl esters (LOVAZA) 1 g capsule Take 2 capsules (2 g total) by mouth 2 (two) times daily. 360 capsule 1  . oxyCODONE-acetaminophen (ROXICET) 5-325 MG/5ML solution Take by mouth every 4 (four) hours as needed for severe pain.    . Probiotic Product (PROBIOTIC-10 PO) Take 1 capsule by mouth daily.    . PSYLLIUM PO Take by mouth.    . solifenacin (VESICARE) 5 MG tablet Take 5 mg by mouth daily.     No current facility-administered medications on file prior to visit.     PAST MEDICAL HISTORY: Past Medical History:  Diagnosis Date  . Acid reflux disease 03/09/2015  .  Aortic stenosis 03/09/2015  . AR (allergic rhinitis) 03/09/2015  . Arthritis   . Back pain   . Controlled type 2 diabetes mellitus without complication, without long-term current use of insulin ([place]) 08/08/2016  . Cough   . 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 Decreased hearing   . Diabetes mellitus without complication ([place])   . Dry mouth   . Easy bruising   . Essential hypertension 08/08/2016  . Heart murmur   . 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 History of CVA (cerebrovascular accident) 08/08/2016   Seen on MRI from 08/2015  . Hyperlipidemia   . Hypertension   . 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 IBS (irritable bowel syndrome) 07/16/2015  . Insomnia 07/16/2015  . Joint pain   . Knee pain   . Left ventricular hypertrophy 07/16/2015  . Leg cramping    right leg  . Mixed hyperlipidemia 08/08/2016  . Morbid obesity ([place]) 11/10/2015  . Nasal discharge   . Nuclear sclerotic cataract of left eye 07/07/2016  . OAB (overactive bladder)  01/20/2016  . OSA (obstructive sleep apnea) 03/09/2015  . Osteoporosis 03/09/2015  . 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 Pulmonary hypertension ([place]) 07/16/2015  . Red eyes   . Right kidney stone   . Shortness of breath on exertion   . Sleep apnea   . Stroke ([place])   . Swelling of both lower extremities   . UTI (urinary tract infection)   . Vitamin D deficiency 03/09/2015    PAST SURGICAL HISTORY: Past Surgical History:  Procedure Laterality Date  . ABDOMINAL HYSTERECTOMY  09/14/2016  . CATARACT EXTRACTION Left   . CATARACT EXTRACTION Left 06/2016  . COLPORRHAPHY N/A 09/14/2016   [facility]  . CYSTOCELE REPAIR    . FRACTURE SURGERY Left 1953   L arm  . FRACTURE SURGERY Left 1985   L ankle  . HERNIA REPAIR  2004  . JOINT REPLACEMENT Right 2012   R TKA  . JOINT REPLACEMENT Left 2017  . KIDNEY STONE SURGERY    . LAPAROSCOPIC ASSISTED VAGINAL HYSTERECTOMY N/A 09/14/2016   [facility]  . R foot/ankle Right 2013 and 2014   plate and screws lateral foot and tendon removal medial foot in 2013, revision in 2014  . RECTOCELE REPAIR    . URETERAL STENT PLACEMENT    . URETEROSCOPY      SOCIAL HISTORY: Social History   Tobacco Use  . Smoking status: Never Smoker  . Smokeless tobacco: Never Used  Substance Use Topics  . Alcohol use: Yes    Alcohol/week: 1.0 standard drinks    Types: 1 Glasses of wine per week    Comment: couple of glasses per week  . Drug use: No    FAMILY HISTORY: Family History  Problem Relation Age of Onset  . Hyperlipidemia Mother   . Heart disease Mother   . Hypertension Mother   . Stroke Mother   . Thyroid disease Mother   . Hypertension Father   . Heart disease Father     ROS: Review of Systems  Constitutional: Negative for weight loss.  Gastrointestinal: Negative for nausea and vomiting.  Musculoskeletal:       Negative for muscle weakness.    PHYSICAL EXAM: Blood pressure 124/76, pulse 79, height 5\' 1"  (1.549 m), weight 240 lb (108.9  kg), SpO2 99 %. Body mass index is 45.35 kg/m. Physical Exam Vitals signs reviewed.  Constitutional:      Appearance: Normal appearance. She is obese.  Cardiovascular:     Rate and Rhythm: Normal rate.  Pulmonary:     Effort: Pulmonary effort is normal.  Musculoskeletal: Normal range of motion.  Skin:    General: Skin is warm and dry.  Neurological:     Mental Status: She is alert and oriented to person, place, and time.  Psychiatric:        Mood and Affect: Mood normal.        Behavior: Behavior normal.     RECENT LABS AND TESTS: BMET    Component Value Date/Time   NA 143 02/01/2018 0000   K 4.8 02/01/2018 0000   CL 104 02/01/2018 0000   CO2 23 02/01/2018 0000   GLUCOSE 128 (H) 02/01/2018 0000   GLUCOSE 109 (H) 05/24/2017 1023   BUN 13 02/01/2018 0000   CREATININE 0.57 02/01/2018 0000   CALCIUM 8.8 02/01/2018 0000   GFRNONAA 93 02/01/2018 0000   GFRAA 107 02/01/2018 0000   Lab Results  Component Value Date   HGBA1C 6.7 (H) 01/31/2018   HGBA1C 6.4 (H) 09/28/2017   HGBA1C 7.8 (H) 05/24/2017   HGBA1C 6.7 (H) 12/14/2016   HGBA1C 7.1 (H) 08/08/2016   Lab Results  Component Value Date   INSULIN 24.2 09/28/2017   INSULIN 17.6 06/21/2017   CBC    Component Value Date/Time   WBC WILL FOLLOW 02/01/2018 0000   WBC 6.4 05/24/2017 1023   RBC WILL FOLLOW 02/01/2018 0000   RBC 4.51 05/24/2017 1023   HGB WILL FOLLOW 02/01/2018 0000   HCT WILL FOLLOW 02/01/2018 0000   PLT WILL FOLLOW 02/01/2018 0000   MCV WILL FOLLOW 02/01/2018 0000   MCH WILL FOLLOW 02/01/2018 0000   MCHC WILL FOLLOW 02/01/2018 0000   MCHC 33.2 05/24/2017 1023   RDW WILL FOLLOW 02/01/2018 0000   LYMPHSABS WILL FOLLOW 02/01/2018 0000   EOSABS WILL FOLLOW 02/01/2018 0000   BASOSABS WILL FOLLOW 02/01/2018 0000   Iron/TIBC/Ferritin/ %Sat No results found for: IRON, TIBC, FERRITIN, IRONPCTSAT Lipid Panel     Component Value Date/Time   CHOL 148 02/01/2018 0000   TRIG 114 02/01/2018 0000   HDL  51 02/01/2018 0000   CHOLHDL 2.8 09/28/2017 0817   CHOLHDL 3 12/14/2016 0856   VLDL 51.4 (H) 12/14/2016 0856   LDLCALC 74 02/01/2018 0000   LDLDIRECT 66.0 12/14/2016 0856   Hepatic Function Panel     Component Value Date/Time   PROT 6.8 02/01/2018 0000   ALBUMIN 4.4 02/01/2018 0000   AST 13 02/01/2018 0000   ALT 6 02/01/2018 0000   ALKPHOS 63 02/01/2018 0000   BILITOT 0.9 02/01/2018 0000      Component Value Date/Time   TSH 1.610 06/21/2017 1057   Results for EMSLEY, CUSTER (MRN [id number]) as of 04/30/2018 12:31  Ref. Range 02/01/2018 00:00  Vitamin D, 25-Hydroxy Latest Ref Range: 30.0 - 100.0 ng/mL 40.4   OBESITY BEHAVIORAL INTERVENTION VISIT  Today's visit was # 13  Starting weight: 248 lbs Starting date: 06/21/17 Today's weight : Weight: 240 lb (108.9 kg)  Today's date: 04/30/2018 Total lbs lost to date: 8 At least 15 minutes were spent on discussing the following behavioral intervention visit.    04/30/2018  Height 5\' 1"  (1.549 m)  Weight 240 lb (108.9 kg)  BMI (Calculated) 45.37  BLOOD PRESSURE - SYSTOLIC 588  BLOOD PRESSURE - DIASTOLIC 76   Body Fat % 50.2 %  Total Body Water (lbs) 79.2 lbs   ASK: We discussed the diagnosis of obesity with [place]  Rosita Fire today and Kacy agreed to give Jasmin [name] permission to discuss obesity behavioral modification therapy today.  ASSESS: Xochilth has the diagnosis of obesity and her BMI today is 45.37. Katerina is in the action stage of change.   ADVISE: Rodneisha was educated on the multiple health risks of obesity as well as the benefit of weight loss to improve her health. She was advised of the need for long term treatment and the importance of lifestyle modifications to improve her current health and to decrease her risk of future health problems.  AGREE: Multiple dietary modification options and treatment options were discussed and Shanitha agreed to follow the recommendations documented in the above note.  ARRANGE: Ayda was  educated on the importance of frequent visits to treat obesity as outlined per CMS and USPSTF guidelines and agreed to schedule her next follow up appointment today.  IMarcille Blanco, CMA, am acting as transcriptionist for Starlyn Skeans, MD  I have reviewed the above documentation for accuracy and completeness, and I agree with the above. -Dennard Nip, MD

## 2018-05-03 MED FILL — VIT D2 1.25 MG (50,000 UNIT: 1.25 MG | 28 days supply | Qty: 4 | Fill #0

## 2018-05-10 MED FILL — OMEGA-3 ETHYL ESTER 1 GM CA: 1 | 90 days supply | Qty: 360 | Fill #1

## 2018-05-10 MED FILL — metFORMIN HCL 500 MG TABS: 500 | 90 days supply | Qty: 180 | Fill #1

## 2018-05-15 ENCOUNTER — Encounter (INDEPENDENT_AMBULATORY_CARE_PROVIDER_SITE_OTHER): Payer: Self-pay

## 2018-05-21 ENCOUNTER — Ambulatory Visit (INDEPENDENT_AMBULATORY_CARE_PROVIDER_SITE_OTHER): Payer: Medicare Other | Admitting: Family Medicine

## 2018-05-25 MED FILL — MYRBETRIQ ER 50 MG TABLET: 50 | 90 days supply | Qty: 90 | Fill #0

## 2018-05-25 MED FILL — SOLIFENACIN SUCCINATE 5 MG: 5 | 90 days supply | Qty: 90 | Fill #0

## 2018-05-25 MED FILL — CEPHALEXIN 250 MG CAPSULE: 250 | 90 days supply | Qty: 90 | Fill #0

## 2018-05-28 ENCOUNTER — Ambulatory Visit: Payer: Medicare Other | Admitting: *Deleted

## 2018-05-28 ENCOUNTER — Other Ambulatory Visit: Payer: Self-pay | Admitting: Family Medicine

## 2018-05-28 DIAGNOSIS — E782 Mixed hyperlipidemia: Secondary | ICD-10-CM

## 2018-05-28 MED FILL — ATORVASTATIN 20 MG TABLET: 20 | 90 days supply | Qty: 90 | Fill #0

## 2018-05-28 MED FILL — FLUTICASONE PROP 50 MCG SPR: 50 | 90 days supply | Qty: 48 | Fill #1

## 2018-05-28 MED FILL — CLOPIDOGREL 75 MG TABLET: 75 | 90 days supply | Qty: 90 | Fill #1

## 2018-05-28 NOTE — Telephone Encounter (Signed)
Copied from [place]. Topic: Quick Communication - Rx Refill/Question >> May 28, 2018  8:13 AM Rayann Heman wrote: Medication:atorvastatin (LIPITOR) 20 MG tablet [[id number]]   Has the patient contacted their pharmacy? no Preferred Pharmacy (with phone number or street name): [place], [place] - [name] [phone] (Phone) [phone] (Fax)   Agent: Please be advised that RX refills may take up to 3 business days. We ask that you follow-up with your pharmacy.

## 2018-05-28 NOTE — Telephone Encounter (Signed)
Requested Prescriptions  Pending Prescriptions Disp Refills  . atorvastatin (LIPITOR) 20 MG tablet [Pharmacy Med Name: ATORVASTATIN CALCIUM 20 MG 20 TAB] 90 tablet 0    Sig: TAKE 1 TABLET BY MOUTH DAILY **NEED OFFICE VISIT FOR REFILLS**     Cardiovascular:  Antilipid - Statins Passed - 05/28/2018  9:22 AM      Passed - Total Cholesterol in normal range and within 360 days    Cholesterol, Total  Date Value Ref Range Status  02/01/2018 148 100 - 199 mg/dL Final         Passed - LDL in normal range and within 360 days    LDL Calculated  Date Value Ref Range Status  02/01/2018 74 0 - 99 mg/dL Final         Passed - HDL in normal range and within 360 days    HDL  Date Value Ref Range Status  02/01/2018 51 >39 mg/dL Final         Passed - Triglycerides in normal range and within 360 days    Triglycerides  Date Value Ref Range Status  02/01/2018 114 0 - 149 mg/dL Final         Passed - Patient is not pregnant      Passed - Valid encounter within last 12 months    Recent Outpatient Visits          3 months ago Non-seasonal allergic rhinitis, unspecified trigger   [facility], [place]   5 months ago [place] [facility] [place], [place]   1 year ago Cough   [place] [facility], Gay Filler, [place]   1 year ago Cough   [facility] [place], Jeremy E, [place]   1 year ago Acute maxillary sinusitis, recurrence not specified   [facility] [place], [facility]            In 2 months Vevelyn Royals, Parthenia Ames, Research scientist (physical sciences) at [employer], [facility]

## 2018-06-04 ENCOUNTER — Ambulatory Visit (INDEPENDENT_AMBULATORY_CARE_PROVIDER_SITE_OTHER): Payer: Medicare Other | Admitting: Family Medicine

## 2018-07-03 ENCOUNTER — Other Ambulatory Visit (INDEPENDENT_AMBULATORY_CARE_PROVIDER_SITE_OTHER): Payer: Self-pay | Admitting: Family Medicine

## 2018-07-03 DIAGNOSIS — E559 Vitamin D deficiency, unspecified: Secondary | ICD-10-CM

## 2018-07-27 MED FILL — LISINOPRIL 10 MG TABLET: 10 | 90 days supply | Qty: 90 | Fill #2

## 2018-08-13 ENCOUNTER — Other Ambulatory Visit: Payer: Self-pay

## 2018-08-13 DIAGNOSIS — E782 Mixed hyperlipidemia: Secondary | ICD-10-CM

## 2018-08-13 DIAGNOSIS — E119 Type 2 diabetes mellitus without complications: Secondary | ICD-10-CM

## 2018-08-13 MED ORDER — ATORVASTATIN CALCIUM 20 MG PO TABS: ORAL_TABLET | ORAL | 1 refills | Status: DC

## 2018-08-13 MED ORDER — METFORMIN HCL 500 MG PO TABS: 500.0000 mg | ORAL_TABLET | Freq: Two times a day (BID) | ORAL | 1 refills | Status: DC

## 2018-08-13 MED FILL — metFORMIN HCL 500 MG TABS: 500 | 90 days supply | Qty: 180 | Fill #0

## 2018-08-13 MED FILL — ATORVASTATIN 20 MG TABLET: 20 | 90 days supply | Qty: 90 | Fill #0

## 2018-08-13 NOTE — Progress Notes (Addendum)
Virtual Visit via Video Note  I connected with patient on 08/14/18 at  8:00 AM EDT by audio enabled telemedicine application and verified that I am speaking with the correct person using two identifiers.   THIS ENCOUNTER IS A VIRTUAL VISIT DUE TO COVID-19 - PATIENT WAS NOT SEEN IN THE OFFICE. PATIENT HAS CONSENTED TO VIRTUAL VISIT / TELEMEDICINE VISIT   Location of patient: home  Location of provider: office  I discussed the limitations of evaluation and management by telemedicine and the availability of in person appointments. The patient expressed understanding and agreed to proceed.   Subjective:   Jasmin [name] is a 73 y.o. female who presents for Medicare Annual (Subsequent) preventive examination.  Review of Systems: No ROS.  Medicare Wellness Virtual Visit.  Visual/audio telehealth visit, UTA vital signs.   See social history for additional risk factors. Cardiac Risk Factors include: advanced age (>33men, >27 women);diabetes mellitus;dyslipidemia;hypertension Sleep patterns:  No issues Home Safety/Smoke Alarms: Feels safe in home. Smoke alarms in place.  Lives with husband and adult son in 2 story home. Stays on main level. No issues navigating stairs.   Female:        Mammo- 11/13/17       Dexa scan- declines at this time. CCS- 07/16/15 pt reported   Eye-Dr.Forsee yearly. Pt states she is scheduled to go back in Jan 2021.    Objective:     Vitals: Wt 198 lb (89.8 kg)   BMI 37.41 kg/m   Body mass index is 37.41 kg/m.  Advanced Directives 08/14/2018 05/25/2017 10/02/2016 09/24/2015 06/03/2014  Does Patient Have a Medical Advance Directive? Yes Yes No Yes Yes  Type of Paramedic of [place];Living will [place];Living will - Living will;Healthcare Power of [place]  Does patient want to make changes to medical advance directive? No - Patient declined - - - No - Patient declined  Copy of [place] in Chart? No - copy requested No - copy requested - - No - copy requested  Would patient like information on creating a medical advance directive? - - Yes (ED - Information included in AVS) - -    Tobacco Social History   Tobacco Use  Smoking Status Never Smoker  Smokeless Tobacco Never Used     Counseling given: Not Answered   Clinical Intake: Pain : No/denies pain    Past Medical History:  Diagnosis Date  . Acid reflux disease 03/09/2015  . Aortic stenosis 03/09/2015  . AR (allergic rhinitis) 03/09/2015  . Arthritis   . Back pain   . Controlled type 2 diabetes mellitus without complication, without long-term current use of insulin ([place]) 08/08/2016  . Cough   . 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 Decreased hearing   . Diabetes mellitus without complication ([place])   . Dry mouth   . Easy bruising   . Essential hypertension 08/08/2016  . Heart murmur   . 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 History of CVA (cerebrovascular accident) 08/08/2016   Seen on MRI from 08/2015  . Hyperlipidemia   . Hypertension   . 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 IBS (irritable bowel syndrome) 07/16/2015  . Insomnia 07/16/2015  . Joint pain   . Knee pain   . Left ventricular hypertrophy 07/16/2015  . Leg cramping    right leg  . Mixed hyperlipidemia 08/08/2016  . Morbid obesity ([place]) 11/10/2015  . Nasal discharge   . Nuclear sclerotic  cataract of left eye 07/07/2016  . OAB (overactive bladder) 01/20/2016  . OSA (obstructive sleep apnea) 03/09/2015  . Osteoporosis 03/09/2015  . 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 Pulmonary hypertension ([place]) 07/16/2015  . Red eyes   . Right kidney stone   . Shortness of breath on exertion   . Sleep apnea   . Stroke ([place])   . Swelling of both lower extremities   . UTI (urinary tract infection)   . Vitamin D deficiency 03/09/2015   Past Surgical History:  Procedure Laterality Date  . ABDOMINAL HYSTERECTOMY   09/14/2016  . CATARACT EXTRACTION Left   . CATARACT EXTRACTION Left 06/2016  . COLPORRHAPHY N/A 09/14/2016   [facility]  . CYSTOCELE REPAIR    . FRACTURE SURGERY Left 1953   L arm  . FRACTURE SURGERY Left 1985   L ankle  . HERNIA REPAIR  2004  . JOINT REPLACEMENT Right 2012   R TKA  . JOINT REPLACEMENT Left 2017  . KIDNEY STONE SURGERY    . LAPAROSCOPIC ASSISTED VAGINAL HYSTERECTOMY N/A 09/14/2016   [facility]  . R foot/ankle Right 2013 and 2014   plate and screws lateral foot and tendon removal medial foot in 2013, revision in 2014  . RECTOCELE REPAIR    . URETERAL STENT PLACEMENT    . URETEROSCOPY     Family History  Problem Relation Age of Onset  . Hyperlipidemia Mother   . Heart disease Mother   . Hypertension Mother   . Stroke Mother   . Thyroid disease Mother   . Hypertension Father   . Heart disease Father    Social History   Socioeconomic History  . Marital status: Married    Spouse name: Navada Osterhout  . Number of children: 1  . Years of education: Not on file  . Highest education level: Not on file  Occupational History  . Occupation: Retired Tour manager  . Financial resource strain: Not on file  . Food insecurity    Worry: Not on file    Inability: Not on file  . Transportation needs    Medical: Not on file    Non-medical: Not on file  Tobacco Use  . Smoking status: Never Smoker  . Smokeless tobacco: Never Used  Substance and Sexual Activity  . Alcohol use: Yes    Alcohol/week: 1.0 standard drinks    Types: 1 Glasses of wine per week    Comment: couple of glasses per week  . Drug use: No  . Sexual activity: Not Currently  Lifestyle  . Physical activity    Days per week: Not on file    Minutes per session: Not on file  . Stress: Not on file  Relationships  . Social Herbalist on phone: Not on file    Gets together: Not on file    Attends religious service: Not on file    Active member of club or organization: Not on file     Attends meetings of clubs or organizations: Not on file    Relationship status: Not on file  Other Topics Concern  . Not on file  Social History Narrative  . Not on file    Outpatient Encounter Medications as of 08/14/2018  Medication Sig  . acetaminophen (TYLENOL) 500 MG tablet Take by mouth as needed.   [name] albuterol (PROVENTIL HFA;VENTOLIN HFA) 108 (90 Base) MCG/ACT inhaler Inhale 2 puffs into the lungs every 6 (six) hours as needed for  wheezing or shortness of breath.  [name] atorvastatin (LIPITOR) 20 MG tablet TAKE 1 TABLET BY MOUTH DAILY **NEED OFFICE VISIT FOR REFILLS**  . CALCIUM PO Take 1 tablet by mouth daily.  . cephALEXin (KEFLEX) 250 MG capsule Take 250 mg by mouth daily.  . Cholecalciferol (VITAMIN D3) 2000 units TABS Take by mouth. Take one tablet in the AM  . clopidogrel (PLAVIX) 75 MG tablet TAKE ONE (1) TABLET BY MOUTH EVERY DAY  . desonide (DESOWEN) 0.05 % cream Apply topically as needed.   [name] econazole nitrate 1 % cream Apply topically as needed.   . fluticasone (FLONASE) 50 MCG/ACT nasal spray Place 2 sprays into both nostrils daily.  [name] lisinopril (PRINIVIL,ZESTRIL) 10 MG tablet TAKE ONE (1) TABLET EACH DAY  . metFORMIN (GLUCOPHAGE) 500 MG tablet Take 1 tablet (500 mg total) by mouth 2 (two) times daily with a meal.  . metroNIDAZOLE (METROCREAM) 0.75 % cream Apply topically 2 (two) times daily.  . mirabegron ER (MYRBETRIQ) 50 MG TB24 tablet Take 50 mg by mouth daily.   [name] omega-3 acid ethyl esters (LOVAZA) 1 g capsule Take 2 capsules (2 g total) by mouth 2 (two) times daily.  [name] oxyCODONE-acetaminophen (ROXICET) 5-325 MG/5ML solution Take by mouth every 4 (four) hours as needed for severe pain.  . Probiotic Product (PROBIOTIC-10 PO) Take 1 capsule by mouth daily.  . PSYLLIUM PO Take by mouth.  . solifenacin (VESICARE) 5 MG tablet Take 5 mg by mouth daily.  [name] amoxicillin (AMOXIL) 500 MG capsule Take prior to dentist procedure  . buPROPion (WELLBUTRIN SR) 150 MG 12 hr tablet  Take 1 tablet (150 mg total) by mouth daily. (Patient not taking: Reported on 08/14/2018)  . Vitamin D, Ergocalciferol, (DRISDOL) 1.25 MG (50000 UT) CAPS capsule Take 1 capsule (50,000 Units total) by mouth every 7 (seven) days. (Patient not taking: Reported on 08/14/2018)  . [DISCONTINUED] atorvastatin (LIPITOR) 20 MG tablet TAKE 1 TABLET BY MOUTH DAILY **NEED OFFICE VISIT FOR REFILLS**  . [DISCONTINUED] clindamycin (CLEOCIN T) 1 % lotion   . [DISCONTINUED] estradiol (ESTRACE) 0.1 MG/GM vaginal cream Place vaginally.  . [DISCONTINUED] metFORMIN (GLUCOPHAGE) 500 MG tablet Take 1 tablet (500 mg total) by mouth 2 (two) times daily with a meal.   No facility-administered encounter medications on file as of 08/14/2018.     Activities of Daily Living In your present state of health, do you have any difficulty performing the following activities: 08/14/2018  Hearing? N  Vision? N  Difficulty concentrating or making decisions? N  Walking or climbing stairs? N  Dressing or bathing? N  Doing errands, shopping? N  Preparing Food and eating ? N  Using the Toilet? N  In the past six months, have you accidently leaked urine? Y  Do you have problems with loss of bowel control? N  Managing your Medications? N  Managing your Finances? N  Housekeeping or managing your Housekeeping? N  Some recent data might be hidden    Patient Care Team: Copland, Gay Filler, MD as PCP - General (Family Medicine) Charlaine Dalton, MD as Consulting Physician (Pulmonary Disease) Al-Khori, Stann Mainland., MD as Consulting Physician (Cardiology) Donette Larry, MD as Consulting Physician (Orthopedic Surgery) Hollar, Katharine Look, MD as Consulting Physician (Dermatology) Janeann Forehand, MD as Consulting Physician (Sports Medicine) Puschinsky, Fransico Him., MD as Consulting Physician (General Surgery) Pill, Barnabas Lister, MD as Consulting Physician (Orthopedic Surgery) Merilynn Finland., DDS as Consulting Physician  (Dentistry) Frazier Butt., MD (Obstetrics and Gynecology)  Assessment:   This is a routine wellness examination for Dailyn. Physical assessment deferred to PCP.  Exercise Activities and Dietary recommendations Current Exercise Habits: Home exercise routine, Type of exercise: walking(10,000 steps per day), Intensity: Mild, Exercise limited by: None identified Diet (meal preparation, eat out, water intake, caffeinated beverages, dairy products, fruits and vegetables): in general, a "healthy" diet  , well balanced, on average, 3 meals per day. Following Dr.Beasley's diet.   Goals    . Weight (lb) < 175 lb (79.4 kg)     Continuing to eat healthy and exercise. Following with Dr.Beasley.    . Weight (lb) < 200 lb (90.7 kg)     By eating smaller portions. Eating more vegetables. Eating less red meats. Stay more active.       Fall Risk Fall Risk  08/14/2018 01/31/2018 01/31/2018 05/25/2017 08/08/2016  Falls in the past year? 0 0 0 No No     Depression Screen PHQ 2/9 Scores 08/14/2018 01/31/2018 01/31/2018 06/21/2017  PHQ - 2 Score 0 0 0 4  PHQ- 9 Score - - - 15  Exception Documentation - - - -     Cognitive Function Ad8 score reviewed for issues:  Issues making decisions:no  Less interest in hobbies / activities:no  Repeats questions, stories (family complaining):no  Trouble using ordinary gadgets (microwave, computer, phone):no  Forgets the month or year: no  Mismanaging finances: no  Remembering appts:no  Daily problems with thinking and/or memory:no Ad8 score is=0   MMSE - Mini Mental State Exam 05/25/2017  Orientation to time 5  Orientation to Place 5  Registration 3  Attention/ Calculation 5  Recall 3  Language- name 2 objects 2  Language- repeat 1  Language- follow 3 step command 3  Language- read & follow direction 1  Write a sentence 1  Copy design 1  Total score 30        Immunization History  Administered Date(s) Administered  . Influenza,  High Dose Seasonal PF 11/23/2016, 11/06/2017  . Influenza,inj,Quad PF,6+ Mos 11/17/2014  . Influenza,inj,quad, With Preservative 11/17/2014  . Influenza-Unspecified 12/08/2015  . Pneumococcal Conjugate-13 10/02/2013  . Pneumococcal Polysaccharide-23 02/03/2010  . Tdap 11/10/2010  . Zoster 05/04/2010  . Zoster Recombinat (Shingrix) 08/01/2017, 10/31/2017    Screening Tests Health Maintenance  Topic Date Due  . PNA vac Low Risk Adult (2 of 2 - PPSV23) 02/04/2015  . OPHTHALMOLOGY EXAM  06/21/2017  . HEMOGLOBIN A1C  08/02/2018  . INFLUENZA VACCINE  09/22/2018  . FOOT EXAM  02/01/2019  . MAMMOGRAM  11/14/2019  . TETANUS/TDAP  11/09/2020  . COLONOSCOPY  07/15/2025  . DEXA SCAN  Completed  . Hepatitis C Screening  Completed      Plan:   See you next year!  Continue to eat heart healthy diet (full of fruits, vegetables, whole grains, lean protein, water--limit salt, fat, and sugar intake) and increase physical activity as tolerated.  Continue doing brain stimulating activities (puzzles, reading, adult coloring books, staying active) to keep memory sharp.   Bring a copy of your living will and/or healthcare power of attorney to your next office visit.   I have personally reviewed and noted the following in the patient's chart:   . Medical and social history . Use of alcohol, tobacco or illicit drugs  . Current medications and supplements . Functional ability and status . Nutritional status . Physical activity . Advanced directives . List of other physicians . Hospitalizations, surgeries, and ER visits in previous 12 months .  Vitals . Screenings to include cognitive, depression, and falls . Referrals and appointments  In addition, I have reviewed and discussed with patient certain preventive protocols, quality metrics, and best practice recommendations. A written personalized care plan for preventive services as well as general preventive health recommendations were provided  to patient.     Shela Nevin, [place]  08/14/2018  I have reviewed the above MWE note by Ms. Vevelyn Royals and agree with her documentation Denny Peon MD

## 2018-08-14 ENCOUNTER — Other Ambulatory Visit: Payer: Self-pay

## 2018-08-14 ENCOUNTER — Encounter: Payer: Self-pay | Admitting: *Deleted

## 2018-08-14 ENCOUNTER — Ambulatory Visit (INDEPENDENT_AMBULATORY_CARE_PROVIDER_SITE_OTHER): Payer: Medicare Other | Admitting: *Deleted

## 2018-08-14 VITALS — Wt 198.0 lb

## 2018-08-14 DIAGNOSIS — Z0000 Encounter for general adult medical examination without abnormal findings: Secondary | ICD-10-CM | POA: Diagnosis not present

## 2018-08-14 NOTE — Patient Instructions (Signed)
See you next year!  Continue to eat heart healthy diet (full of fruits, vegetables, whole grains, lean protein, water--limit salt, fat, and sugar intake) and increase physical activity as tolerated.  Continue doing brain stimulating activities (puzzles, reading, adult coloring books, staying active) to keep memory sharp.   Bring a copy of your living will and/or healthcare power of attorney to your next office visit.   Jasmin [name] , Thank you for taking time to come for your Medicare Wellness Visit. I appreciate your ongoing commitment to your health goals. Please review the following plan we discussed and let me know if I can assist you in the future.   These are the goals we discussed: Goals    . Weight (lb) < 175 lb (79.4 kg)     Continuing to eat healthy and exercise. Following with Dr.Beasley.    . Weight (lb) < 200 lb (90.7 kg)     By eating smaller portions. Eating more vegetables. Eating less red meats. Stay more active.       This is a list of the screening recommended for you and due dates:  Health Maintenance  Topic Date Due  . Pneumonia vaccines (2 of 2 - PPSV23) 02/04/2015  . Eye exam for diabetics  06/21/2017  . Hemoglobin A1C  08/02/2018  . Flu Shot  09/22/2018  . Complete foot exam   02/01/2019  . Mammogram  11/14/2019  . Tetanus Vaccine  11/09/2020  . Colon Cancer Screening  07/15/2025  . DEXA scan (bone density measurement)  Completed  .  Hepatitis C: One time screening is recommended by Center for Disease Control  (CDC) for  adults born from [birth year] through 1965.   Completed    Health Maintenance After Age 34 After age 49, you are at a higher risk for certain long-term diseases and infections as well as injuries from falls. Falls are a major cause of broken bones and head injuries in people who are older than age 22. Getting regular preventive care can help to keep you healthy and well. Preventive care includes getting regular testing and making lifestyle  changes as recommended by your health care provider. Talk with your health care provider about:  Which screenings and tests you should have. A screening is a test that checks for a disease when you have no symptoms.  A diet and exercise plan that is right for you. What should I know about screenings and tests to prevent falls? Screening and testing are the best ways to find a health problem early. Early diagnosis and treatment give you the best chance of managing medical conditions that are common after age 36. Certain conditions and lifestyle choices may make you more likely to have a fall. Your health care provider may recommend:  Regular vision checks. Poor vision and conditions such as cataracts can make you more likely to have a fall. If you wear glasses, make sure to get your prescription updated if your vision changes.  Medicine review. Work with your health care provider to regularly review all of the medicines you are taking, including over-the-counter medicines. Ask your health care provider about any side effects that may make you more likely to have a fall. Tell your health care provider if any medicines that you take make you feel dizzy or sleepy.  Osteoporosis screening. Osteoporosis is a condition that causes the bones to get weaker. This can make the bones weak and cause them to break more easily.  Blood pressure screening. Blood  pressure changes and medicines to control blood pressure can make you feel dizzy.  Strength and balance checks. Your health care provider may recommend certain tests to check your strength and balance while standing, walking, or changing positions.  Foot health exam. Foot pain and numbness, as well as not wearing proper footwear, can make you more likely to have a fall.  Depression screening. You may be more likely to have a fall if you have a fear of falling, feel emotionally low, or feel unable to do activities that you used to do.  Alcohol use  screening. Using too much alcohol can affect your balance and may make you more likely to have a fall. What actions can I take to lower my risk of falls? General instructions  Talk with your health care provider about your risks for falling. Tell your health care provider if: ? You fall. Be sure to tell your health care provider about all falls, even ones that seem minor. ? You feel dizzy, sleepy, or off-balance.  Take over-the-counter and prescription medicines only as told by your health care provider. These include any supplements.  Eat a healthy diet and maintain a healthy weight. A healthy diet includes low-fat dairy products, low-fat (lean) meats, and fiber from whole grains, beans, and lots of fruits and vegetables. Home safety  Remove any tripping hazards, such as rugs, cords, and clutter.  Install safety equipment such as grab bars in bathrooms and safety rails on stairs.  Keep rooms and walkways well-lit. Activity   Follow a regular exercise program to stay fit. This will help you maintain your balance. Ask your health care provider what types of exercise are appropriate for you.  If you need a cane or walker, use it as recommended by your health care provider.  Wear supportive shoes that have nonskid soles. Lifestyle  Do not drink alcohol if your health care provider tells you not to drink.  If you drink alcohol, limit how much you have: ? 0-1 drink a day for women. ? 0-2 drinks a day for men.  Be aware of how much alcohol is in your drink. In the [place] one drink equals one typical bottle of beer (12 oz), one-half glass of wine (5 oz), or one shot of hard liquor (1 oz).  Do not use any products that contain nicotine or tobacco, such as cigarettes and e-cigarettes. If you need help quitting, ask your health care provider. Summary  Having a healthy lifestyle and getting preventive care can help to protect your health and wellness after age 47.  Screening and testing  are the best way to find a health problem early and help you avoid having a fall. Early diagnosis and treatment give you the best chance for managing medical conditions that are more common for people who are older than age 70.  Falls are a major cause of broken bones and head injuries in people who are older than age 104. Take precautions to prevent a fall at home.  Work with your health care provider to learn what changes you can make to improve your health and wellness and to prevent falls. This information is not intended to replace advice given to you by your health care provider. Make sure you discuss any questions you have with your health care provider. Document Released: 12/21/2016 Document Revised: 12/21/2016 Document Reviewed: 12/21/2016 Elsevier Interactive Patient Education  2019 [employer].

## 2018-08-21 ENCOUNTER — Encounter: Payer: Self-pay | Admitting: Family Medicine

## 2018-08-21 NOTE — Progress Notes (Signed)
[place] at [facility] [address], [place], [place] [address] [phone]  Date:  08/23/2018   Name:  Jasmin [name]   DOB:  March 26, 1945   MRN:  [id number]  PCP:  Darreld Mclean, MD    Chief Complaint: No chief complaint on file.   History of Present Illness:  Jasmin [name] is a 73 y.o. very pleasant female patient who presents with the following:  Virtual follow-up visit today Pt location is home, provider location is office Pt ID confirmed with 2 factors, she gives consent for virtual visit today  She [facility].  Is she still seeing them; she is not doing this right now but plans to re-start once pandemic is resolved    Last seen by myself in December She has history of diabetes, hypertension/pulmonary hypertension, hyperlipidemia, stroke, obstructive sleep apnea, aortic valve stenosis She had a minor stroke in July 2017, diagnosed on MRI.  Only symptoms now are that she might be slightly off balance-she is maintained on Plavix She last saw her cardiologist about a year ago Her urogyn Dr. Kathyrn Lass is treating her for UTI and incontinence They have her on chronic low dose keflex which is helping She has her on both vesicare and myrbetriq  Lipitor 20 Wellbutrin SR 150 daily; she notes that her mood is good, she is not suffering from depression sx Plavix 75 Lisinopril 10 Metformin 500 twice daily Lovaza- she is taking 2 BID   Most recent labs in [place] did have a lipid panel then which looked fine Eye exam: 02/09/2018 Immun: can offer pneumovax booster- her last shot done prior to age 61 She may have this done with her flu shot this fall dexa is about due - however she does not yet feel comfortable going out, will let me know when she would like me to order this for her   She has been trying to stay home and stay well during the pandemic She is doing lots of yard  word She has not been sick at all  She is checking her glucose some- last night she checked her glucose late and it was 87, 102 this am Her am sugars are generally right around 100  No sx of hypoglycemia She is watching her carbs and sugars, she is trying to lose some weight  She does check her BP on occasion 124/70 at last check   She needs me to sign a form for [employer] about her CPAP machine- I am glad to do this, she will send it with her husband to his visit next week  Lab Results  Component Value Date   HGBA1C 6.7 (H) 01/31/2018     Patient Active Problem List   Diagnosis Date Noted  . Cough 03/17/2017  . Arthritis 12/16/2016  . UTI (urinary tract infection)   . Stroke ([place])   . Sleep apnea   . Right kidney stone   . Hypertension   . Hyperlipidemia   . Heart murmur   . Diabetes mellitus without complication ([place])   . 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 Mixed hyperlipidemia 08/08/2016  . Controlled type 2 diabetes mellitus without complication, without long-term current use of insulin ([place]) 08/08/2016  . Essential hypertension 08/08/2016  . History of CVA (cerebrovascular accident) 08/08/2016  . Nuclear sclerotic cataract of left eye 07/07/2016  . OAB (overactive  bladder) 01/20/2016  . Morbid obesity ([place]) 11/10/2015  . IBS (irritable bowel syndrome) 07/16/2015  . Insomnia 07/16/2015  . Left ventricular hypertrophy 07/16/2015  . Pulmonary hypertension ([place]) 07/16/2015  . 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 Acid reflux disease 03/09/2015  . Aortic stenosis 03/09/2015  . AR (allergic rhinitis) 03/09/2015  . 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 OSA (obstructive sleep apnea) 03/09/2015  . Osteoporosis 03/09/2015  . Vitamin D deficiency 03/09/2015    Past Medical History:  Diagnosis Date  . Acid reflux disease 03/09/2015  . Aortic stenosis 03/09/2015  . AR (allergic rhinitis) 03/09/2015  .  Arthritis   . Back pain   . Controlled type 2 diabetes mellitus without complication, without long-term current use of insulin ([place]) 08/08/2016  . Cough   . 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 Decreased hearing   . Diabetes mellitus without complication ([place])   . Dry mouth   . Easy bruising   . Essential hypertension 08/08/2016  . Heart murmur   . 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 History of CVA (cerebrovascular accident) 08/08/2016   Seen on MRI from 08/2015  . Hyperlipidemia   . Hypertension   . 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 IBS (irritable bowel syndrome) 07/16/2015  . Insomnia 07/16/2015  . Joint pain   . Knee pain   . Left ventricular hypertrophy 07/16/2015  . Leg cramping    right leg  . Mixed hyperlipidemia 08/08/2016  . Morbid obesity ([place]) 11/10/2015  . Nasal discharge   . Nuclear sclerotic cataract of left eye 07/07/2016  . OAB (overactive bladder) 01/20/2016  . OSA (obstructive sleep apnea) 03/09/2015  . Osteoporosis 03/09/2015  . 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 Pulmonary hypertension ([place]) 07/16/2015  . Red eyes   . Right kidney stone   . Shortness of breath on exertion   . Sleep apnea   . Stroke ([place])   . Swelling of both lower extremities   . UTI (urinary tract infection)   . Vitamin D deficiency 03/09/2015    Past Surgical History:  Procedure Laterality Date  . ABDOMINAL HYSTERECTOMY  09/14/2016  . CATARACT EXTRACTION Left   . CATARACT EXTRACTION Left 06/2016  . COLPORRHAPHY N/A 09/14/2016   [facility]  . CYSTOCELE REPAIR    . FRACTURE SURGERY Left 1953   L arm  . FRACTURE SURGERY Left 1985   L ankle  . HERNIA REPAIR  2004  . JOINT REPLACEMENT Right 2012   R TKA  . JOINT REPLACEMENT Left 2017  . KIDNEY STONE SURGERY    . LAPAROSCOPIC ASSISTED VAGINAL HYSTERECTOMY N/A 09/14/2016   [facility]  . R foot/ankle Right 2013 and 2014   plate and screws lateral foot and tendon removal medial foot in 2013, revision in 2014  .  RECTOCELE REPAIR    . URETERAL STENT PLACEMENT    . URETEROSCOPY      Social History   Tobacco Use  . Smoking status: Never Smoker  . Smokeless tobacco: Never Used  Substance Use Topics  . Alcohol use: Yes    Alcohol/week: 1.0 standard drinks    Types: 1 Glasses of wine per week    Comment: couple of glasses per week  . Drug use: No    Family History  Problem Relation Age of Onset  . Hyperlipidemia Mother   . Heart disease Mother   . Hypertension Mother   . Stroke Mother   . Thyroid disease Mother   .  Hypertension Father   . Heart disease Father     Allergies  Allergen Reactions  . Latex Itching  . Ciprofloxacin Hives  . Metronidazole Hives  . Adhesive [Tape] Rash    Medication list has been reviewed and updated.  Current Outpatient Medications on File Prior to Visit  Medication Sig Dispense Refill  . acetaminophen (TYLENOL) 500 MG tablet Take by mouth as needed.     [name] albuterol (PROVENTIL HFA;VENTOLIN HFA) 108 (90 Base) MCG/ACT inhaler Inhale 2 puffs into the lungs every 6 (six) hours as needed for wheezing or shortness of breath. 1 Inhaler 1  . amoxicillin (AMOXIL) 500 MG capsule Take prior to dentist procedure    . atorvastatin (LIPITOR) 20 MG tablet TAKE 1 TABLET BY MOUTH DAILY **NEED OFFICE VISIT FOR REFILLS** 90 tablet 1  . buPROPion (WELLBUTRIN SR) 150 MG 12 hr tablet Take 1 tablet (150 mg total) by mouth daily. (Patient not taking: Reported on 08/14/2018) 30 tablet 0  . CALCIUM PO Take 1 tablet by mouth daily.    . cephALEXin (KEFLEX) 250 MG capsule Take 250 mg by mouth daily.    . Cholecalciferol (VITAMIN D3) 2000 units TABS Take by mouth. Take one tablet in the AM    . clopidogrel (PLAVIX) 75 MG tablet TAKE ONE (1) TABLET BY MOUTH EVERY DAY 90 tablet 1  . desonide (DESOWEN) 0.05 % cream Apply topically as needed.     [name] econazole nitrate 1 % cream Apply topically as needed.     . fluticasone (FLONASE) 50 MCG/ACT nasal spray Place 2 sprays into both nostrils  daily. 48 g 3  . lisinopril (PRINIVIL,ZESTRIL) 10 MG tablet TAKE ONE (1) TABLET EACH DAY 90 tablet 3  . metFORMIN (GLUCOPHAGE) 500 MG tablet Take 1 tablet (500 mg total) by mouth 2 (two) times daily with a meal. 180 tablet 1  . metroNIDAZOLE (METROCREAM) 0.75 % cream Apply topically 2 (two) times daily.    . mirabegron ER (MYRBETRIQ) 50 MG TB24 tablet Take 50 mg by mouth daily.     [name] omega-3 acid ethyl esters (LOVAZA) 1 g capsule Take 2 capsules (2 g total) by mouth 2 (two) times daily. 360 capsule 1  . oxyCODONE-acetaminophen (ROXICET) 5-325 MG/5ML solution Take by mouth every 4 (four) hours as needed for severe pain.    . Probiotic Product (PROBIOTIC-10 PO) Take 1 capsule by mouth daily.    . PSYLLIUM PO Take by mouth.    . solifenacin (VESICARE) 5 MG tablet Take 5 mg by mouth daily.    . Vitamin D, Ergocalciferol, (DRISDOL) 1.25 MG (50000 UT) CAPS capsule Take 1 capsule (50,000 Units total) by mouth every 7 (seven) days. (Patient not taking: Reported on 08/14/2018) 4 capsule 0   No current facility-administered medications on file prior to visit.     Review of Systems:  As per HPI- otherwise negative.   Physical Examination: There were no vitals filed for this visit. There were no vitals filed for this visit. There is no height or weight on file to calculate BMI. Ideal Body Weight:   Pt observed on video- overweight, otherwise looks well No cough, wheezing or distress is noted   Assessment and Plan:   ICD-10-CM   1. Type 2 diabetes mellitus without complication, without long-term current use of insulin (HCC)  E11.9 Hemoglobin W4X    Basic metabolic panel    Microalbumin / creatinine urine ratio  2. Other hyperlipidemia  E78.49   3. Essential hypertension  I10 CBC  Basic metabolic panel  4. Mixed hyperlipidemia  E78.2 omega-3 acid ethyl esters (LOVAZA) 1 g capsule  5. History of CVA (cerebrovascular accident)  Z86.73 clopidogrel (PLAVIX) 75 MG tablet   Periodic recheck visit  today -  She will come in for labs asap Will plan further follow- up pending labs. Refilled needed meds   Follow-up: No follow-ups on file.  Meds ordered this encounter  Medications  . omega-3 acid ethyl esters (LOVAZA) 1 g capsule    Sig: Take 2 capsules (2 g total) by mouth 2 (two) times daily.    Dispense:  360 capsule    Refill:  3  . clopidogrel (PLAVIX) 75 MG tablet    Sig: TAKE ONE (1) TABLET BY MOUTH EVERY DAY    Dispense:  90 tablet    Refill:  3   Orders Placed This Encounter  Procedures  . Hemoglobin A1c  . CBC  . Basic metabolic panel  . Microalbumin / creatinine urine ratio    @SIGN @  Outpatient Encounter Medications as of 08/23/2018  Medication Sig  . acetaminophen (TYLENOL) 500 MG tablet Take by mouth as needed.   [name] albuterol (PROVENTIL HFA;VENTOLIN HFA) 108 (90 Base) MCG/ACT inhaler Inhale 2 puffs into the lungs every 6 (six) hours as needed for wheezing or shortness of breath.  [name] amoxicillin (AMOXIL) 500 MG capsule Take prior to dentist procedure  . atorvastatin (LIPITOR) 20 MG tablet TAKE 1 TABLET BY MOUTH DAILY **NEED OFFICE VISIT FOR REFILLS**  . buPROPion (WELLBUTRIN SR) 150 MG 12 hr tablet Take 1 tablet (150 mg total) by mouth daily. (Patient not taking: Reported on 08/14/2018)  . CALCIUM PO Take 1 tablet by mouth daily.  . cephALEXin (KEFLEX) 250 MG capsule Take 250 mg by mouth daily.  . Cholecalciferol (VITAMIN D3) 2000 units TABS Take by mouth. Take one tablet in the AM  . clopidogrel (PLAVIX) 75 MG tablet TAKE ONE (1) TABLET BY MOUTH EVERY DAY  . desonide (DESOWEN) 0.05 % cream Apply topically as needed.   [name] econazole nitrate 1 % cream Apply topically as needed.   . fluticasone (FLONASE) 50 MCG/ACT nasal spray Place 2 sprays into both nostrils daily.  [name] lisinopril (PRINIVIL,ZESTRIL) 10 MG tablet TAKE ONE (1) TABLET EACH DAY  . metFORMIN (GLUCOPHAGE) 500 MG tablet Take 1 tablet (500 mg total) by mouth 2 (two) times daily with a meal.  . metroNIDAZOLE  (METROCREAM) 0.75 % cream Apply topically 2 (two) times daily.  . mirabegron ER (MYRBETRIQ) 50 MG TB24 tablet Take 50 mg by mouth daily.   [name] omega-3 acid ethyl esters (LOVAZA) 1 g capsule Take 2 capsules (2 g total) by mouth 2 (two) times daily.  [name] oxyCODONE-acetaminophen (ROXICET) 5-325 MG/5ML solution Take by mouth every 4 (four) hours as needed for severe pain.  . Probiotic Product (PROBIOTIC-10 PO) Take 1 capsule by mouth daily.  . PSYLLIUM PO Take by mouth.  . solifenacin (VESICARE) 5 MG tablet Take 5 mg by mouth daily.  . Vitamin D, Ergocalciferol, (DRISDOL) 1.25 MG (50000 UT) CAPS capsule Take 1 capsule (50,000 Units total) by mouth every 7 (seven) days. (Patient not taking: Reported on 08/14/2018)  . [DISCONTINUED] clopidogrel (PLAVIX) 75 MG tablet TAKE ONE (1) TABLET BY MOUTH EVERY DAY  . [DISCONTINUED] omega-3 acid ethyl esters (LOVAZA) 1 g capsule Take 2 capsules (2 g total) by mouth 2 (two) times daily.   No facility-administered encounter medications on file as of 08/23/2018.      Signed Janett Billow Issiah Huffaker,  MD

## 2018-08-22 MED FILL — CEPHALEXIN 250 MG CAPSULE: 250 | 90 days supply | Qty: 90 | Fill #1

## 2018-08-23 ENCOUNTER — Other Ambulatory Visit: Payer: Self-pay

## 2018-08-23 ENCOUNTER — Encounter: Payer: Self-pay | Admitting: Family Medicine

## 2018-08-23 ENCOUNTER — Ambulatory Visit (INDEPENDENT_AMBULATORY_CARE_PROVIDER_SITE_OTHER): Payer: Medicare Other | Admitting: Family Medicine

## 2018-08-23 DIAGNOSIS — E119 Type 2 diabetes mellitus without complications: Secondary | ICD-10-CM

## 2018-08-23 DIAGNOSIS — E7849 Other hyperlipidemia: Secondary | ICD-10-CM

## 2018-08-23 DIAGNOSIS — E782 Mixed hyperlipidemia: Secondary | ICD-10-CM | POA: Diagnosis not present

## 2018-08-23 DIAGNOSIS — I10 Essential (primary) hypertension: Secondary | ICD-10-CM | POA: Diagnosis not present

## 2018-08-23 DIAGNOSIS — Z8673 Personal history of transient ischemic attack (TIA), and cerebral infarction without residual deficits: Secondary | ICD-10-CM

## 2018-08-23 MED ORDER — OMEGA-3-ACID ETHYL ESTERS 1 G PO CAPS: 2.0000 | ORAL_CAPSULE | Freq: Two times a day (BID) | ORAL | 3 refills | Status: DC

## 2018-08-23 MED ORDER — CLOPIDOGREL BISULFATE 75 MG PO TABS: ORAL_TABLET | ORAL | 3 refills | Status: DC

## 2018-08-23 MED FILL — CLOPIDOGREL 75 MG TABLET: 75 | 90 days supply | Qty: 90 | Fill #0

## 2018-08-23 MED FILL — OMEGA-3 ETHYL ESTER 1 GM CA: 1 | 90 days supply | Qty: 360 | Fill #0

## 2018-08-29 ENCOUNTER — Other Ambulatory Visit (INDEPENDENT_AMBULATORY_CARE_PROVIDER_SITE_OTHER): Payer: Medicare Other

## 2018-08-29 ENCOUNTER — Other Ambulatory Visit: Payer: Self-pay

## 2018-08-29 ENCOUNTER — Encounter: Payer: Self-pay | Admitting: Family Medicine

## 2018-08-29 DIAGNOSIS — I10 Essential (primary) hypertension: Secondary | ICD-10-CM | POA: Diagnosis not present

## 2018-08-29 DIAGNOSIS — E119 Type 2 diabetes mellitus without complications: Secondary | ICD-10-CM

## 2018-08-29 LAB — CBC
HCT: 43.1 % (ref 36.0–46.0)
Hemoglobin: 14.4 g/dL (ref 12.0–15.0)
MCHC: 33.3 g/dL (ref 30.0–36.0)
MCV: 92 fl (ref 78.0–100.0)
Platelets: 288 10*3/uL (ref 150.0–400.0)
RBC: 4.69 Mil/uL (ref 3.87–5.11)
RDW: 15.4 % (ref 11.5–15.5)
WBC: 7.7 10*3/uL (ref 4.0–10.5)

## 2018-08-29 LAB — BASIC METABOLIC PANEL
BUN: 17 mg/dL (ref 6–23)
CO2: 27 mEq/L (ref 19–32)
Calcium: 10 mg/dL (ref 8.4–10.5)
Chloride: 100 mEq/L (ref 96–112)
Creatinine, Ser: 0.69 mg/dL (ref 0.40–1.20)
GFR: 83.29 mL/min (ref >60.00)
Glucose, Bld: 112 mg/dL — ABNORMAL HIGH (ref 70–99)
Potassium: 4.6 mEq/L (ref 3.5–5.1)
Sodium: 140 mEq/L (ref 135–145)

## 2018-08-29 LAB — HEMOGLOBIN A1C: Hgb A1c MFr Bld: 6.3 % (ref 4.6–6.5)

## 2018-08-29 LAB — MICROALBUMIN / CREATININE URINE RATIO
Creatinine,U: 30 mg/dL
Microalb Creat Ratio: 2.3 mg/g (ref 0.0–30.0)
Microalb, Ur: <0.7 mg/dL (ref 0.0–1.9)

## 2018-09-06 MED FILL — MYRBETRIQ ER 50 MG TABLET: 50 | 90 days supply | Qty: 90 | Fill #1

## 2018-09-06 MED FILL — SOLIFENACIN SUCCINATE 5 MG: 5 | 90 days supply | Qty: 90 | Fill #1

## 2018-10-04 MED FILL — DESONIDE 0.05% CREAM: 0.05 | 30 days supply | Qty: 60 | Fill #0

## 2018-10-10 MED FILL — SULFACETAMIDE-SULFUR 10-5%: 10-5 | 30 days supply | Qty: 57 | Fill #0

## 2018-10-15 MED FILL — LISINOPRIL 10 MG TABS: 10 | 90 days supply | Qty: 90 | Fill #3

## 2018-10-23 ENCOUNTER — Ambulatory Visit (INDEPENDENT_AMBULATORY_CARE_PROVIDER_SITE_OTHER): Payer: Medicare Other

## 2018-10-23 ENCOUNTER — Other Ambulatory Visit: Payer: Self-pay

## 2018-10-23 DIAGNOSIS — Z23 Encounter for immunization: Secondary | ICD-10-CM | POA: Diagnosis not present

## 2018-10-23 NOTE — Progress Notes (Signed)
Pre visit review using our clinic review tool, if applicable. No additional management support is needed unless otherwise documented below in the visit note.given in patients left deltoid IM.   Patient here today for High Dose vaccine. 0.32mL given in left deltoid IM Patient tolerated well.

## 2018-11-07 MED FILL — OMEGA-3-ACID ETHYL ESTERS 1: 1 | 90 days supply | Qty: 360 | Fill #1

## 2018-11-07 MED FILL — FLUTICASONE PROP 50 MCG SPR: 50 | 90 days supply | Qty: 48 | Fill #2

## 2018-11-07 MED FILL — CLOPIDOGREL 75 MG TABLET: 75 | 90 days supply | Qty: 90 | Fill #1

## 2018-11-07 MED FILL — metFORMIN HCL 500 MG TABS: 500 | 90 days supply | Qty: 180 | Fill #1

## 2018-11-07 MED FILL — ATORVASTATIN 20 MG TABLET: 20 | 90 days supply | Qty: 90 | Fill #1

## 2018-11-12 MED FILL — CEPHALEXIN 250 MG CAPSULE: 250 | 90 days supply | Qty: 90 | Fill #2

## 2018-12-03 MED FILL — MYRBETRIQ ER 50 MG TABLET: 50 | 90 days supply | Qty: 90 | Fill #2

## 2018-12-03 MED FILL — SOLIFENACIN SUCCINATE 5 MG: 5 | 90 days supply | Qty: 90 | Fill #2

## 2018-12-26 DIAGNOSIS — N952 Postmenopausal atrophic vaginitis: Secondary | ICD-10-CM | POA: Insufficient documentation

## 2018-12-26 DIAGNOSIS — N390 Urinary tract infection, site not specified: Secondary | ICD-10-CM | POA: Insufficient documentation

## 2018-12-26 DIAGNOSIS — R350 Frequency of micturition: Secondary | ICD-10-CM | POA: Insufficient documentation

## 2018-12-26 DIAGNOSIS — R351 Nocturia: Secondary | ICD-10-CM | POA: Insufficient documentation

## 2018-12-26 MED FILL — DESMOPRESSIN ACETATE 0.2 MG: 0.2 | 30 days supply | Qty: 90 | Fill #0

## 2018-12-31 ENCOUNTER — Ambulatory Visit (HOSPITAL_BASED_OUTPATIENT_CLINIC_OR_DEPARTMENT_OTHER)
Admission: RE | Admit: 2018-12-31 | Discharge: 2018-12-31 | Disposition: A | Discharge status: Home / Self Care | Payer: Medicare Other | Source: Ambulatory Visit | Attending: Family Medicine | Admitting: Family Medicine

## 2018-12-31 ENCOUNTER — Other Ambulatory Visit: Payer: Self-pay

## 2018-12-31 ENCOUNTER — Encounter: Payer: Self-pay | Admitting: Family Medicine

## 2018-12-31 ENCOUNTER — Ambulatory Visit: Payer: Medicare Other | Admitting: Family Medicine

## 2018-12-31 VITALS — BP 132/80 | HR 88 | Temp 97.7°F | Resp 19 | Ht 61.0 in | Wt 247.0 lb

## 2018-12-31 DIAGNOSIS — Z23 Encounter for immunization: Secondary | ICD-10-CM

## 2018-12-31 DIAGNOSIS — I10 Essential (primary) hypertension: Secondary | ICD-10-CM

## 2018-12-31 DIAGNOSIS — R0781 Pleurodynia: Secondary | ICD-10-CM | POA: Diagnosis not present

## 2018-12-31 DIAGNOSIS — E7849 Other hyperlipidemia: Secondary | ICD-10-CM

## 2018-12-31 DIAGNOSIS — E119 Type 2 diabetes mellitus without complications: Secondary | ICD-10-CM

## 2018-12-31 DIAGNOSIS — Z8673 Personal history of transient ischemic attack (TIA), and cerebral infarction without residual deficits: Secondary | ICD-10-CM

## 2018-12-31 DIAGNOSIS — W102XXA Fall (on)(from) incline, initial encounter: Secondary | ICD-10-CM | POA: Diagnosis not present

## 2018-12-31 DIAGNOSIS — R0789 Other chest pain: Secondary | ICD-10-CM

## 2018-12-31 LAB — CBC
HCT: 41.2 % (ref 36.0–46.0)
Hemoglobin: 13.7 g/dL (ref 12.0–15.0)
MCHC: 33.2 g/dL (ref 30.0–36.0)
MCV: 91.8 fl (ref 78.0–100.0)
Platelets: 259 10*3/uL (ref 150.0–400.0)
RBC: 4.49 Mil/uL (ref 3.87–5.11)
RDW: 15.2 % (ref 11.5–15.5)
WBC: 6.4 10*3/uL (ref 4.0–10.5)

## 2018-12-31 LAB — COMPREHENSIVE METABOLIC PANEL
ALT: 13 U/L (ref 0–35)
AST: 18 U/L (ref 0–37)
Albumin: 4.6 g/dL (ref 3.5–5.2)
Alkaline Phosphatase: 60 U/L (ref 39–117)
BUN: 14 mg/dL (ref 6–23)
CO2: 28 mEq/L (ref 19–32)
Calcium: 9.2 mg/dL (ref 8.4–10.5)
Chloride: 100 mEq/L (ref 96–112)
Creatinine, Ser: 0.56 mg/dL (ref 0.40–1.20)
GFR: 105.87 mL/min (ref >60.00)
Glucose, Bld: 119 mg/dL — ABNORMAL HIGH (ref 70–99)
Potassium: 4.6 mEq/L (ref 3.5–5.1)
Sodium: 136 mEq/L (ref 135–145)
Total Bilirubin: 1 mg/dL (ref 0.2–1.2)
Total Protein: 6.8 g/dL (ref 6.0–8.3)

## 2018-12-31 LAB — LIPID PANEL
Cholesterol: 148 mg/dL (ref 0–200)
HDL: 46.6 mg/dL (ref >39.00)
LDL Cholesterol: 69 mg/dL (ref 0–99)
NonHDL: 101.29
Total CHOL/HDL Ratio: 3
Triglycerides: 162 mg/dL — ABNORMAL HIGH (ref 0.0–149.0)
VLDL: 32.4 mg/dL (ref 0.0–40.0)

## 2018-12-31 LAB — HEMOGLOBIN A1C: Hgb A1c MFr Bld: 6.6 % — ABNORMAL HIGH (ref 4.6–6.5)

## 2018-12-31 MED ORDER — HYDROCODONE-ACETAMINOPHEN 5-325 MG PO TABS: 1.0000 | ORAL_TABLET | Freq: Four times a day (QID) | ORAL | 0 refills | Status: DC | PRN

## 2018-12-31 MED ORDER — OXYCODONE-ACETAMINOPHEN 5-325 MG PO TABS: 1.0000 | ORAL_TABLET | Freq: Three times a day (TID) | ORAL | 0 refills | Status: DC | PRN

## 2018-12-31 MED FILL — OXYCODONE-ACETAMINOPHEN 5-3: 5-325 | 5 days supply | Qty: 15 | Fill #0

## 2018-12-31 NOTE — Patient Instructions (Signed)
It was good to see you today but I am sorry that you fell and got hurt!  I will be in touch with your labs You got your pneumonia booster today Please stop by the x-ray dept on the way out today and they will take x-rays of your ribs.  I will be in touch with your results ASAP  Use the hydrocodone pain medication as needed for pain in your ribs.  Please be sure to take deep breaths several times a day to help prevent congestion or pneumonia.  If you notice any fever or chills, or otherwise are not feeling well please let me know.  If you have a rib fracture, it may take several weeks for your pain to completely resolve.

## 2018-12-31 NOTE — Progress Notes (Addendum)
[place] [facility] [address] [place], [place] [address] [phone]  Date:  12/31/2018   Name:  Jasmin [name]   DOB:  [birth year]   MRN:  [id number]  PCP:  Darreld Mclean, MD    Chief Complaint: Fall (left rip side pain, worse when breathing, bruising on breast)   History of Present Illness:  Jasmin [name] is a 73 y.o. very pleasant female patient who presents with the following:  Patient with history of diabetes, hypertension, hyperlipidemia, stroke, OSA, obesity She fell this past Saturday- today is Monday.  She was out doing her walk with her husband Legrand Como- she tripped over a stump and fell forward.  She fell onto her left knee and her left ribs. She was able to break her fall with her hands- wrists and hands are ok  Here today with concern of pain in her hip Her knee and hip are ok She has some pain with deep breathing or moving around.  Sitting at rest she is fine Changing position- getting in and out of bed- is painful  No fever She notes some chronic cough due to her CPAP machine  Flu shot is up-to-date Shingrix series is complete Can give Pneumovax booster if she would like-last dose given at age 29  She is using some oxycodone that she had left over from knee surgery in 2018 at night and aleve during the day  This regimen does help control her pain We plan to give her pain medication today as she has 1 oxycodone left.  She states that hydrocodone does not really help her typically  12/11/2017  1   12/11/2017  Hydrocodone-Homatropine Soln  100.00  7 Ed Sag   [id number]   Med (5269)   0  14.29 MME  Comm Ins   [place]  05/24/2017  1   05/24/2017  Promethazine-Codeine Syrup  80.00  4 Je Cop   [id number]   [place] (8236)   0  6.00 MME  Medicare   [place]  03/17/2017  1   03/17/2017  Promethazine-Codeine Syrup  80.00  4 Je Sch   [id number]   Med (5269)   0  6.00 MME  Comm Ins   [place]  03/09/2017  1   03/09/2017   Hydrocodone-Homatropine Syrup  100.00  7 Yv Low   [id number]   Arc (8236)   0  14.29 MME       Lab Results  Component Value Date   HGBA1C 6.3 08/29/2018    Patient Active Problem List   Diagnosis Date Noted  . Cough 03/17/2017  . Arthritis 12/16/2016  . UTI (urinary tract infection)   . Stroke ([place])   . Sleep apnea   . Right kidney stone   . Hypertension   . Hyperlipidemia   . Heart murmur   . Diabetes mellitus without complication ([place])   . 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 Mixed hyperlipidemia 08/08/2016  . Controlled type 2 diabetes mellitus without complication, without long-term current use of insulin ([place]) 08/08/2016  . Essential hypertension 08/08/2016  . History of CVA (cerebrovascular accident) 08/08/2016  . Nuclear sclerotic cataract of left eye 07/07/2016  . OAB (overactive bladder) 01/20/2016  . Morbid obesity ([place]) 11/10/2015  . IBS (irritable bowel syndrome) 07/16/2015  . Insomnia 07/16/2015  . Left ventricular hypertrophy 07/16/2015  . Pulmonary hypertension ([place]) 07/16/2015  . Primary osteoarthritis of left knee  03/31/2015  . Acid reflux disease 03/09/2015  . Aortic stenosis 03/09/2015  . AR (allergic rhinitis) 03/09/2015  . 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 OSA (obstructive sleep apnea) 03/09/2015  . Osteoporosis 03/09/2015  . Vitamin D deficiency 03/09/2015    Past Medical History:  Diagnosis Date  . Acid reflux disease 03/09/2015  . Aortic stenosis 03/09/2015  . AR (allergic rhinitis) 03/09/2015  . Arthritis   . Back pain   . Controlled type 2 diabetes mellitus without complication, without long-term current use of insulin ([place]) 08/08/2016  . Cough   . 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 Decreased hearing   . Diabetes mellitus without complication ([place])   . Dry mouth   . Easy bruising   . Essential hypertension 08/08/2016  . Heart murmur   . History of bilateral knee replacement  12/07/2016  . History of CVA (cerebrovascular accident) 08/08/2016   Seen on MRI from 08/2015  . Hyperlipidemia   . Hypertension   . 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 IBS (irritable bowel syndrome) 07/16/2015  . Insomnia 07/16/2015  . Joint pain   . Knee pain   . Left ventricular hypertrophy 07/16/2015  . Leg cramping    right leg  . Mixed hyperlipidemia 08/08/2016  . Morbid obesity ([place]) 11/10/2015  . Nasal discharge   . Nuclear sclerotic cataract of left eye 07/07/2016  . OAB (overactive bladder) 01/20/2016  . OSA (obstructive sleep apnea) 03/09/2015  . Osteoporosis 03/09/2015  . 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 Pulmonary hypertension ([place]) 07/16/2015  . Red eyes   . Right kidney stone   . Shortness of breath on exertion   . Sleep apnea   . Stroke ([place])   . Swelling of both lower extremities   . UTI (urinary tract infection)   . Vitamin D deficiency 03/09/2015    Past Surgical History:  Procedure Laterality Date  . ABDOMINAL HYSTERECTOMY  09/14/2016  . CATARACT EXTRACTION Left   . CATARACT EXTRACTION Left 06/2016  . COLPORRHAPHY N/A 09/14/2016   [facility]  . CYSTOCELE REPAIR    . FRACTURE SURGERY Left 1953   L arm  . FRACTURE SURGERY Left 1985   L ankle  . HERNIA REPAIR  2004  . JOINT REPLACEMENT Right 2012   R TKA  . JOINT REPLACEMENT Left 2017  . KIDNEY STONE SURGERY    . LAPAROSCOPIC ASSISTED VAGINAL HYSTERECTOMY N/A 09/14/2016   [facility]  . R foot/ankle Right 2013 and 2014   plate and screws lateral foot and tendon removal medial foot in 2013, revision in 2014  . RECTOCELE REPAIR    . URETERAL STENT PLACEMENT    . URETEROSCOPY      Social History   Tobacco Use  . Smoking status: Never Smoker  . Smokeless tobacco: Never Used  Substance Use Topics  . Alcohol use: Yes    Alcohol/week: 1.0 standard drinks    Types: 1 Glasses of wine per week    Comment: couple of glasses per week  . Drug use: No    Family History  Problem Relation Age of Onset  .  Hyperlipidemia Mother   . Heart disease Mother   . Hypertension Mother   . Stroke Mother   . Thyroid disease Mother   . Hypertension Father   . Heart disease Father     Allergies  Allergen Reactions  . Latex Itching  . Ciprofloxacin Hives  . Metronidazole Hives  . Adhesive [Tape] Rash    Medication  list has been reviewed and updated.  Current Outpatient Medications on File Prior to Visit  Medication Sig Dispense Refill  . acetaminophen (TYLENOL) 500 MG tablet Take by mouth as needed.     [name] albuterol (PROVENTIL HFA;VENTOLIN HFA) 108 (90 Base) MCG/ACT inhaler Inhale 2 puffs into the lungs every 6 (six) hours as needed for wheezing or shortness of breath. 1 Inhaler 1  . amoxicillin (AMOXIL) 500 MG capsule Take prior to dentist procedure    . atorvastatin (LIPITOR) 20 MG tablet TAKE 1 TABLET BY MOUTH DAILY **NEED OFFICE VISIT FOR REFILLS** 90 tablet 1  . buPROPion (WELLBUTRIN SR) 150 MG 12 hr tablet Take 1 tablet (150 mg total) by mouth daily. 30 tablet 0  . CALCIUM PO Take 1 tablet by mouth daily.    . cephALEXin (KEFLEX) 250 MG capsule Take 250 mg by mouth daily.    . Cholecalciferol (VITAMIN D3) 2000 units TABS Take by mouth. Take one tablet in the AM    . clopidogrel (PLAVIX) 75 MG tablet TAKE ONE (1) TABLET BY MOUTH EVERY DAY 90 tablet 3  . desmopressin (DDAVP) 0.2 MG tablet Start 1 tablet nightly for 7 days and can increase to a maximum of 3 tablets over 3 weeks to decrease nocturia    . desonide (DESOWEN) 0.05 % cream Apply topically as needed.     [name] econazole nitrate 1 % cream Apply topically as needed.     . fluticasone (FLONASE) 50 MCG/ACT nasal spray Place 2 sprays into both nostrils daily. 48 g 3  . lisinopril (PRINIVIL,ZESTRIL) 10 MG tablet TAKE ONE (1) TABLET EACH DAY 90 tablet 3  . metFORMIN (GLUCOPHAGE) 500 MG tablet Take 1 tablet (500 mg total) by mouth 2 (two) times daily with a meal. 180 tablet 1  . metroNIDAZOLE (METROCREAM) 0.75 % cream Apply topically 2 (two)  times daily.    . mirabegron ER (MYRBETRIQ) 50 MG TB24 tablet Take 50 mg by mouth daily.     [name] omega-3 acid ethyl esters (LOVAZA) 1 g capsule Take 2 capsules (2 g total) by mouth 2 (two) times daily. 360 capsule 3  . oxyCODONE-acetaminophen (ROXICET) 5-325 MG/5ML solution Take by mouth every 4 (four) hours as needed for severe pain.    . Probiotic Product (PROBIOTIC-10 PO) Take 1 capsule by mouth daily.    . PSYLLIUM PO Take by mouth.    . solifenacin (VESICARE) 5 MG tablet Take 5 mg by mouth daily.    . Vitamin D, Ergocalciferol, (DRISDOL) 1.25 MG (50000 UT) CAPS capsule Take 1 capsule (50,000 Units total) by mouth every 7 (seven) days. 4 capsule 0   No current facility-administered medications on file prior to visit.     Review of Systems:  As per HPI- otherwise negative. No fever or chills  Physical Examination: Vitals:   12/31/18 0844  BP: 132/80  Pulse: 88  Resp: 19  Temp: 97.7 F (36.5 C)  SpO2: 97%   Vitals:   12/31/18 0844  Weight: 247 lb (112 kg)  Height: 5\' 1"  (1.549 m)   Body mass index is 46.67 kg/m. Ideal Body Weight: Weight in (lb) to have BMI = 25: 132  GEN: WDWN, NAD, Non-toxic, A & O x 3, obese, looks well HEENT: Atraumatic, Normocephalic. Neck supple. No masses, No LAD. Ears and Nose: No external deformity. CV: RRR, No M/G/R. No JVD. No thrill. No extra heart sounds. PULM: CTA B, no wheezes, crackles, rhonchi. No retractions. No resp. distress. No accessory muscle use.  ABD: S, NT, ND, +BS. No rebound. No HSM. EXTR: No c/c/e NEURO Normal gait.  PSYCH: Normally interactive. Conversant. Not depressed or anxious appearing.  Calm demeanor.  She has a bruise over her left breast.  Under the breast, along the ribs, she has tenderness. No crepitus or swelling Abdomen is benign   Assessment and Plan: Rib pain on left side - Plan: DG Ribs Unilateral W/Chest Left, oxyCODONE-acetaminophen (PERCOCET) 5-325 MG tablet, DISCONTINUED: HYDROcodone-acetaminophen  (NORCO/VICODIN) 5-325 MG tablet, DISCONTINUED: HYDROcodone-acetaminophen (NORCO/VICODIN) 5-325 MG tablet  Immunization due - Plan: Pneumococcal (PPSV23) vaccine  Fall (on)(from) incline, initial encounter - Plan: DISCONTINUED: HYDROcodone-acetaminophen (NORCO/VICODIN) 5-325 MG tablet, DISCONTINUED: HYDROcodone-acetaminophen (NORCO/VICODIN) 5-325 MG tablet  Type 2 diabetes mellitus without complication, without long-term current use of insulin (HCC) - Plan: Hemoglobin A1c  Other hyperlipidemia - Plan: Lipid panel  Essential hypertension - Plan: CBC, Comprehensive metabolic panel  History of CVA (cerebrovascular accident)  Here today following a fall, she has a rib contusion and possibly fracture.  Will obtain plain films for her today Also do routine lab work since she is here in the office Gave pneumonia booster Prescription for oxycodone to use as needed for pain, cancel prescription for hydrocodone as patient states not helpful for her Encouraged her to take deep breaths and cough several times a day, to prevent chest congestion and pneumonia.  She is to let me know if any symptoms of illness develop Blood pressures well controlled  Signed Lamar Blinks, MD  Received her rib films, message to patient Dg Ribs Unilateral W/chest Left  Result Date: 12/31/2018 CLINICAL DATA:  73 year old female with left sided rib pain. EXAM: LEFT RIBS AND CHEST - 3+ VIEW COMPARISON:  Chest radiograph 05/24/2017 FINDINGS: The lungs are clear. There is no pleural effusion or pneumothorax. The cardiac silhouette is within normal limits. There is atherosclerotic calcification of the aortic arch. Degenerative changes of the spine and shoulders. No acute osseous pathology. No displaced rib fractures. IMPRESSION: 1. No acute cardiopulmonary process. 2. No displaced rib fractures. Electronically Signed   By: Anner Crete M.D.   On: 12/31/2018 10:07    Received her labs 11/10- message to pt Results for  orders placed or performed in visit on 12/31/18  CBC  Result Value Ref Range   WBC 6.4 4.0 - 10.5 K/uL   RBC 4.49 3.87 - 5.11 Mil/uL   Platelets 259.0 150.0 - 400.0 K/uL   Hemoglobin 13.7 12.0 - 15.0 g/dL   HCT 41.2 36.0 - 46.0 %   MCV 91.8 78.0 - 100.0 fl   MCHC 33.2 30.0 - 36.0 g/dL   RDW 15.2 11.5 - 15.5 %  Comprehensive metabolic panel  Result Value Ref Range   Sodium 136 135 - 145 mEq/L   Potassium 4.6 3.5 - 5.1 mEq/L   Chloride 100 96 - 112 mEq/L   CO2 28 19 - 32 mEq/L   Glucose, Bld 119 (H) 70 - 99 mg/dL   BUN 14 6 - 23 mg/dL   Creatinine, Ser 0.56 0.40 - 1.20 mg/dL   Total Bilirubin 1.0 0.2 - 1.2 mg/dL   Alkaline Phosphatase 60 39 - 117 U/L   AST 18 0 - 37 U/L   ALT 13 0 - 35 U/L   Total Protein 6.8 6.0 - 8.3 g/dL   Albumin 4.6 3.5 - 5.2 g/dL   GFR 105.87 >60.00 mL/min   Calcium 9.2 8.4 - 10.5 mg/dL  Hemoglobin A1c  Result Value Ref Range   Hgb A1c MFr Bld  6.6 (H) 4.6 - 6.5 %  Lipid panel  Result Value Ref Range   Cholesterol 148 0 - 200 mg/dL   Triglycerides 162.0 (H) 0.0 - 149.0 mg/dL   HDL 46.60 >39.00 mg/dL   VLDL 32.4 0.0 - 40.0 mg/dL   LDL Cholesterol 69 0 - 99 mg/dL   Total CHOL/HDL Ratio 3    NonHDL 101.29

## 2019-01-01 ENCOUNTER — Encounter: Payer: Self-pay | Admitting: Family Medicine

## 2019-01-08 MED FILL — DESONIDE 0.05% CREAM: 0.05 | 30 days supply | Qty: 60 | Fill #0

## 2019-01-14 ENCOUNTER — Other Ambulatory Visit: Payer: Self-pay | Admitting: Family Medicine

## 2019-01-14 DIAGNOSIS — I10 Essential (primary) hypertension: Secondary | ICD-10-CM

## 2019-01-21 MED FILL — SULFAMETHOXAZOLE-TMP DS TAB: 800-160 | 7 days supply | Qty: 14 | Fill #0

## 2019-01-29 ENCOUNTER — Ambulatory Visit (INDEPENDENT_AMBULATORY_CARE_PROVIDER_SITE_OTHER): Payer: Medicare Other | Admitting: Family Medicine

## 2019-01-29 ENCOUNTER — Other Ambulatory Visit: Payer: Self-pay

## 2019-01-29 ENCOUNTER — Encounter (INDEPENDENT_AMBULATORY_CARE_PROVIDER_SITE_OTHER): Payer: Self-pay | Admitting: Family Medicine

## 2019-01-29 VITALS — BP 147/69 | HR 76 | Temp 97.9°F | Ht 61.0 in | Wt 241.0 lb

## 2019-01-29 DIAGNOSIS — E559 Vitamin D deficiency, unspecified: Secondary | ICD-10-CM

## 2019-01-29 DIAGNOSIS — Z6841 Body Mass Index (BMI) 40.0 and over, adult: Secondary | ICD-10-CM

## 2019-01-29 DIAGNOSIS — F3289 Other specified depressive episodes: Secondary | ICD-10-CM

## 2019-01-29 MED ORDER — BUPROPION HCL ER (SR) 150 MG PO TB12: 150.0000 mg | ORAL_TABLET | Freq: Every day | ORAL | 0 refills | Status: DC

## 2019-01-29 MED ORDER — VITAMIN D (ERGOCALCIFEROL) 1.25 MG (50000 UNIT) PO CAPS: 50000.0000 [IU] | ORAL_CAPSULE | ORAL | 0 refills | Status: DC

## 2019-01-29 MED FILL — BUPROPION HCL ER (SR) 150 M: 150 | 30 days supply | Qty: 30 | Fill #0

## 2019-01-29 MED FILL — VIT D2 1.25 MG (50,000 UNIT: 1.25 MG | 28 days supply | Qty: 4 | Fill #0

## 2019-01-29 NOTE — Progress Notes (Signed)
Office: [phone]  /  Fax: [phone]   HPI:   Chief Complaint: OBESITY Jasmin [name] is here to discuss her progress with her obesity treatment plan. She is on the Category 2 plan and is following her eating plan approximately 20-25% of the time. She states she is walking 10,000 steps 7 times per week. Jasmin [name] has been off track over the pandemic, but is ready to get back on track. She would like to do a different plan. Her weight is 241 lb (109.3 kg) today and has had a weight gain of 1 lb since her last visit. She has lost 7 lbs since starting treatment with [name].  Vitamin D deficiency Jasmin [name] has a diagnosis of Vitamin D deficiency. She has  Been off Vitamin D most of the year and her level is likely to be below goal. She denies nausea, vomiting or muscle weakness.  Depression with emotional eating behaviors Jasmin [name] is struggling with emotional eating and using food for comfort to the extent that it is negatively impacting her health. She often snacks when she is not hungry. Jasmin [name] sometimes feels she is out of control and then feels guilty that she made poor food choices. She has been working on behavior modification techniques to help reduce her emotional eating and has been somewhat successful. Jasmin [name] notes increased emotional eating, worse over the pandemic, and is off of Wellbutrin. She shows no sign of suicidal or homicidal ideations.  Depression screen [facility] 2/9 08/14/2018 01/31/2018 01/31/2018 06/21/2017 05/25/2017  Decreased Interest 0 0 0 3 0  Down, Depressed, Hopeless 0 0 0 1 0  PHQ - 2 Score 0 0 0 4 0  Altered sleeping - - - 3 -  Tired, decreased energy - - - 3 -  Change in appetite - - - 1 -  Feeling bad or failure about yourself  - - - 1 -  Trouble concentrating - - - 1 -  Moving slowly or fidgety/restless - - - 2 -  Suicidal thoughts - - - 0 -  PHQ-9 Score - - - 15 -  Difficult doing work/chores - - - Not difficult at all -   ASSESSMENT AND PLAN:  Vitamin D deficiency -  Plan: Vitamin D, Ergocalciferol, (DRISDOL) 1.25 MG (50000 UT) CAPS capsule  Other depression, emotional eating - Plan: buPROPion (WELLBUTRIN SR) 150 MG 12 hr tablet  Class 3 severe obesity with serious comorbidity and body mass index (BMI) of 45.0 to 49.9 in adult, unspecified obesity type (HCC)  PLAN:  Vitamin D Deficiency Jasmin [name] was informed that low Vitamin D levels contributes to fatigue and are associated with obesity, breast, and colon cancer. She agrees to continue to take prescription Vit D @ 50,000 IU every week #4 with 0 refills and will follow-up for routine testing of Vitamin D, at least 2-3 times per year. She was informed of the risk of over-replacement of Vitamin D and agrees to not increase her dose unless she discusses this with [name] first. Jasmin [name] agrees to follow-up with our clinic in 2 weeks.  Emotional Eating Behaviors (other depression) Behavior modification techniques were discussed today to help Jasmin [name] deal with her emotional/non-hunger eating behaviors. Tierre was given a refill on her Wellbutrin 150 mg #30 with 0 refills and agrees to follow-up with our clinic in 2 weeks.  Obesity Phala is currently in the action stage of change. As such, her goal is to continue with weight loss efforts. She has agreed to keep a food journal with 1000-1300 calories  and 75+ grams of protein.  Jasmin [name] has been instructed to work up to a goal of 150 minutes of combined cardio and strengthening exercise per week for weight loss and overall health benefits. We discussed the following Behavioral Modification Strategies today: increasing lean protein intake, work on meal planning and easy cooking plans, and keep a strict food journal.  Jasmin [name] has agreed to follow-up with our clinic in 2 weeks. She was informed of the importance of frequent follow-up visits to maximize her success with intensive lifestyle modifications for her multiple health conditions.  ALLERGIES: Allergies  Allergen  Reactions  . Latex Itching  . Ciprofloxacin Hives  . Metronidazole Hives  . Adhesive [Tape] Rash    MEDICATIONS: Current Outpatient Medications on File Prior to Visit  Medication Sig Dispense Refill  . acetaminophen (TYLENOL) 500 MG tablet Take by mouth as needed.     [name] albuterol (PROVENTIL HFA;VENTOLIN HFA) 108 (90 Base) MCG/ACT inhaler Inhale 2 puffs into the lungs every 6 (six) hours as needed for wheezing or shortness of breath. 1 Inhaler 1  . amoxicillin (AMOXIL) 500 MG capsule Take prior to dentist procedure    . atorvastatin (LIPITOR) 20 MG tablet TAKE 1 TABLET BY MOUTH DAILY **NEED OFFICE VISIT FOR REFILLS** 90 tablet 1  . CALCIUM PO Take 1 tablet by mouth daily.    . cephALEXin (KEFLEX) 250 MG capsule Take 250 mg by mouth daily.    . Cholecalciferol (VITAMIN D3) 2000 units TABS Take by mouth. Take one tablet in the AM    . clopidogrel (PLAVIX) 75 MG tablet TAKE ONE (1) TABLET BY MOUTH EVERY DAY 90 tablet 3  . desmopressin (DDAVP) 0.2 MG tablet Start 1 tablet nightly for 7 days and can increase to a maximum of 3 tablets over 3 weeks to decrease nocturia    . desonide (DESOWEN) 0.05 % cream Apply topically as needed.     [name] econazole nitrate 1 % cream Apply topically as needed.     [name] estradiol (ESTRACE) 0.1 MG/GM vaginal cream Place 1 Applicatorful vaginally at bedtime.    . fluticasone (FLONASE) 50 MCG/ACT nasal spray Place 2 sprays into both nostrils daily. 48 g 3  . lisinopril (ZESTRIL) 10 MG tablet TAKE ONE (1) TABLET BY MOUTH EVERY DAY 90 tablet 3  . metFORMIN (GLUCOPHAGE) 500 MG tablet Take 1 tablet (500 mg total) by mouth 2 (two) times daily with a meal. 180 tablet 1  . metroNIDAZOLE (METROCREAM) 0.75 % cream Apply topically 2 (two) times daily.    . miconazole (MICOTIN) 2 % powder Apply topically as needed for itching.    . mirabegron ER (MYRBETRIQ) 50 MG TB24 tablet Take 50 mg by mouth daily.     [name] omega-3 acid ethyl esters (LOVAZA) 1 g capsule Take 2 capsules (2 g total)  by mouth 2 (two) times daily. 360 capsule 3  . oxyCODONE-acetaminophen (PERCOCET) 5-325 MG tablet Take 1 tablet by mouth every 8 (eight) hours as needed for severe pain. 15 tablet 0  . Probiotic Product (PROBIOTIC-10 PO) Take 1 capsule by mouth daily.    . PSYLLIUM PO Take by mouth.    . solifenacin (VESICARE) 5 MG tablet Take 5 mg by mouth daily.     No current facility-administered medications on file prior to visit.     PAST MEDICAL HISTORY: Past Medical History:  Diagnosis Date  . Acid reflux disease 03/09/2015  . Aortic stenosis 03/09/2015  . AR (allergic rhinitis) 03/09/2015  . Arthritis   .  Back pain   . Controlled type 2 diabetes mellitus without complication, without long-term current use of insulin ([place]) 08/08/2016  . Cough   . 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 Decreased hearing   . Diabetes mellitus without complication ([place])   . Dry mouth   . Easy bruising   . Essential hypertension 08/08/2016  . Heart murmur   . 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 History of CVA (cerebrovascular accident) 08/08/2016   Seen on MRI from 08/2015  . Hyperlipidemia   . Hypertension   . 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 IBS (irritable bowel syndrome) 07/16/2015  . Insomnia 07/16/2015  . Joint pain   . Knee pain   . Left ventricular hypertrophy 07/16/2015  . Leg cramping    right leg  . Mixed hyperlipidemia 08/08/2016  . Morbid obesity ([place]) 11/10/2015  . Nasal discharge   . Nuclear sclerotic cataract of left eye 07/07/2016  . OAB (overactive bladder) 01/20/2016  . OSA (obstructive sleep apnea) 03/09/2015  . Osteoporosis 03/09/2015  . 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 Pulmonary hypertension ([place]) 07/16/2015  . Red eyes   . Right kidney stone   . Shortness of breath on exertion   . Sleep apnea   . Stroke ([place])   . Swelling of both lower extremities   . UTI (urinary tract infection)   . Vitamin D deficiency 03/09/2015    PAST SURGICAL HISTORY: Past  Surgical History:  Procedure Laterality Date  . ABDOMINAL HYSTERECTOMY  09/14/2016  . CATARACT EXTRACTION Left   . CATARACT EXTRACTION Left 06/2016  . COLPORRHAPHY N/A 09/14/2016   [facility]  . CYSTOCELE REPAIR    . FRACTURE SURGERY Left 1953   L arm  . FRACTURE SURGERY Left 1985   L ankle  . HERNIA REPAIR  2004  . JOINT REPLACEMENT Right 2012   R TKA  . JOINT REPLACEMENT Left 2017  . KIDNEY STONE SURGERY    . LAPAROSCOPIC ASSISTED VAGINAL HYSTERECTOMY N/A 09/14/2016   [facility]  . R foot/ankle Right 2013 and 2014   plate and screws lateral foot and tendon removal medial foot in 2013, revision in 2014  . RECTOCELE REPAIR    . URETERAL STENT PLACEMENT    . URETEROSCOPY      SOCIAL HISTORY: Social History   Tobacco Use  . Smoking status: Never Smoker  . Smokeless tobacco: Never Used  Substance Use Topics  . Alcohol use: Yes    Alcohol/week: 1.0 standard drinks    Types: 1 Glasses of wine per week    Comment: couple of glasses per week  . Drug use: No    FAMILY HISTORY: Family History  Problem Relation Age of Onset  . Hyperlipidemia Mother   . Heart disease Mother   . Hypertension Mother   . Stroke Mother   . Thyroid disease Mother   . Hypertension Father   . Heart disease Father    ROS: Review of Systems  Gastrointestinal: Negative for nausea and vomiting.  Musculoskeletal:       Negative for muscle weakness.  Psychiatric/Behavioral: Positive for depression (emotional eating). Negative for suicidal ideas.       Negative for homicidal ideas.   PHYSICAL EXAM: Blood pressure (!) 147/69, pulse 76, temperature 97.9 F (36.6 C), temperature source Oral, height 5\' 1"  (1.549 m), weight 241 lb (109.3 kg), SpO2 98 %. Body mass index is 45.54 kg/m. Physical Exam Vitals signs reviewed.  Constitutional:  Appearance: Normal appearance. She is obese.  Cardiovascular:     Rate and Rhythm: Normal rate.     Pulses: Normal pulses.  Pulmonary:     Effort: Pulmonary effort is  normal.     Breath sounds: Normal breath sounds.  Musculoskeletal: Normal range of motion.  Skin:    General: Skin is warm and dry.  Neurological:     Mental Status: She is alert and oriented to person, place, and time.  Psychiatric:        Behavior: Behavior normal.   RECENT LABS AND TESTS: BMET    Component Value Date/Time   NA 136 12/31/2018 0932   NA 143 02/01/2018 0000   K 4.6 12/31/2018 0932   CL 100 12/31/2018 0932   CO2 28 12/31/2018 0932   GLUCOSE 119 (H) 12/31/2018 0932   BUN 14 12/31/2018 0932   BUN 13 02/01/2018 0000   CREATININE 0.56 12/31/2018 0932   CALCIUM 9.2 12/31/2018 0932   GFRNONAA 93 02/01/2018 0000   GFRAA 107 02/01/2018 0000   Lab Results  Component Value Date   HGBA1C 6.6 (H) 12/31/2018   HGBA1C 6.3 08/29/2018   HGBA1C 6.7 (H) 01/31/2018   HGBA1C 6.4 (H) 09/28/2017   HGBA1C 7.8 (H) 05/24/2017   Lab Results  Component Value Date   INSULIN 24.2 09/28/2017   INSULIN 17.6 06/21/2017   CBC    Component Value Date/Time   WBC 6.4 12/31/2018 0932   RBC 4.49 12/31/2018 0932   HGB 13.7 12/31/2018 0932   HGB WILL FOLLOW 02/01/2018 0000   HCT 41.2 12/31/2018 0932   HCT WILL FOLLOW 02/01/2018 0000   PLT 259.0 12/31/2018 0932   PLT WILL FOLLOW 02/01/2018 0000   MCV 91.8 12/31/2018 0932   MCV WILL FOLLOW 02/01/2018 0000   MCH WILL FOLLOW 02/01/2018 0000   MCHC 33.2 12/31/2018 0932   RDW 15.2 12/31/2018 0932   RDW WILL FOLLOW 02/01/2018 0000   LYMPHSABS WILL FOLLOW 02/01/2018 0000   EOSABS WILL FOLLOW 02/01/2018 0000   BASOSABS WILL FOLLOW 02/01/2018 0000   Iron/TIBC/Ferritin/ %Sat No results found for: IRON, TIBC, FERRITIN, IRONPCTSAT Lipid Panel     Component Value Date/Time   CHOL 148 12/31/2018 0932   CHOL 148 02/01/2018 0000   TRIG 162.0 (H) 12/31/2018 0932   HDL 46.60 12/31/2018 0932   HDL 51 02/01/2018 0000   CHOLHDL 3 12/31/2018 0932   VLDL 32.4 12/31/2018 0932   LDLCALC 69 12/31/2018 0932   LDLCALC 74 02/01/2018 0000    LDLDIRECT 66.0 12/14/2016 0856   Hepatic Function Panel     Component Value Date/Time   PROT 6.8 12/31/2018 0932   PROT 6.8 02/01/2018 0000   ALBUMIN 4.6 12/31/2018 0932   ALBUMIN 4.4 02/01/2018 0000   AST 18 12/31/2018 0932   ALT 13 12/31/2018 0932   ALKPHOS 60 12/31/2018 0932   BILITOT 1.0 12/31/2018 0932   BILITOT 0.9 02/01/2018 0000      Component Value Date/Time   TSH 1.610 06/21/2017 1057   Results for Mancia, Dorthy "Allyssa CLAUDINE TUCKER Ogren" (MRN [id number]) as of 01/29/2019 11:33  Ref. Range 02/01/2018 00:00  Vitamin D, 25-Hydroxy Latest Ref Range: 30.0 - 100.0 ng/mL 40.4   OBESITY BEHAVIORAL INTERVENTION VISIT  Today's visit was #14  Starting weight: 248 lbs Starting date: 06/21/2017 Today's weight: 241 lbs  Today's date: 01/29/2019 Total lbs lost to date: 7  At least 15 minutes were spent on discussing the following behavioral intervention visit.    01/29/2019  Height 5\' 1"  (1.549 m)  Weight 241 lb (109.3 kg)  BMI (Calculated) 45.56  BLOOD PRESSURE - SYSTOLIC [id number]  BLOOD PRESSURE - DIASTOLIC 69   Body Fat % [id number] %  Total Body Water (lbs) 78.4 lbs   ASK: We discussed the diagnosis of obesity with Jasmin [name] today and Lashondra agreed to give [name] permission to discuss obesity behavioral modification therapy today.  ASSESS: Jasmin [name] has the diagnosis of obesity and her BMI today is 45.5. Jasmin [name] is in the action stage of change.   ADVISE: Jasmin [name] was educated on the multiple health risks of obesity as well as the benefit of weight loss to improve her health. She was advised of the need for long term treatment and the importance of lifestyle modifications to improve her current health and to decrease her risk of future health problems.  AGREE: Multiple dietary modification options and treatment options were discussed and  Jasmin [name] agreed to follow the recommendations documented in the above note.  ARRANGE: Jasmin [name] was educated on the importance of  frequent visits to treat obesity as outlined per CMS and USPSTF guidelines and agreed to schedule her next follow up appointment today.  I, Michaelene Song, am acting as Location manager for Dennard Nip, MD  I have reviewed the above documentation for accuracy and completeness, and I agree with the above. -Dennard Nip, MD

## 2019-02-01 ENCOUNTER — Other Ambulatory Visit: Payer: Self-pay | Admitting: Family Medicine

## 2019-02-01 DIAGNOSIS — J3089 Other allergic rhinitis: Secondary | ICD-10-CM

## 2019-02-01 MED FILL — FLUTICASONE PROP 50 MCG SPR: 50 | 90 days supply | Qty: 48 | Fill #0

## 2019-02-13 ENCOUNTER — Other Ambulatory Visit: Payer: Self-pay

## 2019-02-13 ENCOUNTER — Encounter (INDEPENDENT_AMBULATORY_CARE_PROVIDER_SITE_OTHER): Payer: Self-pay | Admitting: Family Medicine

## 2019-02-13 ENCOUNTER — Telehealth (INDEPENDENT_AMBULATORY_CARE_PROVIDER_SITE_OTHER): Payer: Medicare Other | Admitting: Family Medicine

## 2019-02-13 DIAGNOSIS — F3289 Other specified depressive episodes: Secondary | ICD-10-CM

## 2019-02-13 DIAGNOSIS — Z6841 Body Mass Index (BMI) 40.0 and over, adult: Secondary | ICD-10-CM | POA: Diagnosis not present

## 2019-02-13 DIAGNOSIS — E559 Vitamin D deficiency, unspecified: Secondary | ICD-10-CM

## 2019-02-13 MED ORDER — VITAMIN D (ERGOCALCIFEROL) 1.25 MG (50000 UNIT) PO CAPS: 50000.0000 [IU] | ORAL_CAPSULE | ORAL | 0 refills | Status: DC

## 2019-02-13 MED ORDER — BUPROPION HCL ER (SR) 150 MG PO TB12: 150.0000 mg | ORAL_TABLET | Freq: Every day | ORAL | 0 refills | Status: DC

## 2019-02-17 ENCOUNTER — Encounter: Payer: Self-pay | Admitting: Family Medicine

## 2019-02-17 DIAGNOSIS — E119 Type 2 diabetes mellitus without complications: Secondary | ICD-10-CM

## 2019-02-17 DIAGNOSIS — E782 Mixed hyperlipidemia: Secondary | ICD-10-CM

## 2019-02-18 MED ORDER — METFORMIN HCL 500 MG PO TABS: 500.0000 mg | ORAL_TABLET | Freq: Two times a day (BID) | ORAL | 3 refills | Status: DC

## 2019-02-18 MED ORDER — ATORVASTATIN CALCIUM 20 MG PO TABS: ORAL_TABLET | ORAL | 3 refills | Status: DC

## 2019-02-18 MED FILL — OMEGA-3-ACID ETHYL ESTERS 1: 1 | 90 days supply | Qty: 360 | Fill #2

## 2019-02-18 MED FILL — ATORVASTATIN 20 MG TABLET: 20 | 90 days supply | Qty: 90 | Fill #0

## 2019-02-18 MED FILL — CEPHALEXIN 250 MG CAPSULE: 250 | 90 days supply | Qty: 90 | Fill #3

## 2019-02-18 MED FILL — metFORMIN HCL 500 MG TABS: 500 | 90 days supply | Qty: 180 | Fill #0

## 2019-02-18 MED FILL — CLOPIDOGREL 75 MG TABLET: 75 | 90 days supply | Qty: 90 | Fill #2

## 2019-02-19 MED FILL — NITROFURANTOIN MONO-MCR 100: 100 | 5 days supply | Qty: 10 | Fill #0

## 2019-02-25 DIAGNOSIS — H52203 Unspecified astigmatism, bilateral: Secondary | ICD-10-CM | POA: Diagnosis not present

## 2019-02-25 DIAGNOSIS — H26492 Other secondary cataract, left eye: Secondary | ICD-10-CM | POA: Diagnosis not present

## 2019-02-25 DIAGNOSIS — E119 Type 2 diabetes mellitus without complications: Secondary | ICD-10-CM | POA: Diagnosis not present

## 2019-02-25 DIAGNOSIS — Z7984 Long term (current) use of oral hypoglycemic drugs: Secondary | ICD-10-CM | POA: Diagnosis not present

## 2019-02-25 DIAGNOSIS — H16223 Keratoconjunctivitis sicca, not specified as Sjogren's, bilateral: Secondary | ICD-10-CM | POA: Diagnosis not present

## 2019-02-25 DIAGNOSIS — H5203 Hypermetropia, bilateral: Secondary | ICD-10-CM | POA: Diagnosis not present

## 2019-02-25 DIAGNOSIS — H43813 Vitreous degeneration, bilateral: Secondary | ICD-10-CM | POA: Diagnosis not present

## 2019-02-25 DIAGNOSIS — Z961 Presence of intraocular lens: Secondary | ICD-10-CM | POA: Diagnosis not present

## 2019-02-25 DIAGNOSIS — H2511 Age-related nuclear cataract, right eye: Secondary | ICD-10-CM | POA: Diagnosis not present

## 2019-02-25 NOTE — Progress Notes (Signed)
Office: [phone]  /  Fax: [phone] TeleHealth Visit:  Jasmin [name] has verbally consented to this TeleHealth visit today. The patient is located at home, the provider is located at the [employer] [facility] office. The participants in this visit include the listed provider and patient and any and all parties involved. The visit was conducted today via FaceTime.  HPI:  Chief Complaint: OBESITY Jasmin [name] is here to discuss her progress with her obesity treatment plan. She is on the Category 2 Plan and states she is following her eating plan approximately 50 to 75 % of the time. She states she is walking, and doing a sitting to stand exercise 60 minutes 7 times per week.  Jasmin [name] has done well with her weight loss efforts since her last visit. She thinks she has lost another 4 pounds in the last 2 weeks (weight not reported). She was changed to journaling or she could continue her Category 2 plan.  Emotional Eating Jasmin [name] is doing well on Wellbutrin. Her mood is stable and she is sleeping better. Jasmin [name] is doing better with avoiding temptations.  Vitamin D Deficiency Jasmin [name] has a diagnosis of vitamin D deficiency. Her vitamin D level is not yet at goal, but it is close. She denies nausea, vomiting or muscle weakness.  ASSESSMENT AND PLAN:  Vitamin D deficiency - Plan: Vitamin D, Ergocalciferol, (DRISDOL) 1.25 MG (50000 UT) CAPS capsule  Other depression, emotional eating - Plan: buPROPion (WELLBUTRIN SR) 150 MG 12 hr tablet  Class 3 severe obesity with serious comorbidity and body mass index (BMI) of 45.0 to 49.9 in adult, unspecified obesity type ([place])  PLAN:  Emotional Eating Behavior modification techniques were discussed today to help Jasmin [name] deal with her emotional/non-hunger eating behaviors.  Jasmin [name] agrees to continue Wellbutrin 150 mg daily #30 with no refills and follow up as directed. Orders and follow up as documented in patient record.   Vitamin D  Deficiency Low Vitamin D level contributes to fatigue and are associated with obesity, breast, and colon cancer. Jasmin [name] agrees to continue to take prescription Vitamin D @50 ,000 IU every week #4 with no refills and she will follow-up for routine testing of vitamin D, at least 2-3 times per year to avoid over-replacement. We will recheck labs at the next in-office visit and Jasmin [name] agrees to follow up as directed.  Obesity Jasmin [name] is currently in the action stage of change. As such, her goal is to continue with weight loss efforts. She has agreed to follow the Category 2 Plan or keep a food journal and adhere to recommended goals of 1000 to 1300 calories and 75+ grams of protein daily. Jasmin [name] has been instructed to work up to a goal of 150 minutes of combined cardio and strengthening exercise per week for weight loss and overall health benefits. We discussed the following Behavioral Modification Strategies today: meal planning and cooking strategies and holiday eating strategies .  Jasmin [name] has agreed to follow-up with our clinic in 3 weeks. She was informed of the importance of frequent follow-up visits to maximize her success with intensive lifestyle modifications for her multiple health conditions.  ALLERGIES: Allergies  Allergen Reactions  . Latex Itching  . Ciprofloxacin Hives  . Metronidazole Hives  . Adhesive [Tape] Rash    MEDICATIONS: Current Outpatient Medications on File Prior to Visit  Medication Sig Dispense Refill  . acetaminophen (TYLENOL) 500 MG tablet Take by mouth as needed.     [name] amoxicillin (AMOXIL) 500 MG capsule Take prior to dentist  procedure    . CALCIUM PO Take 1 tablet by mouth daily.    . cephALEXin (KEFLEX) 250 MG capsule Take 250 mg by mouth daily.    . Cholecalciferol (VITAMIN D3) 2000 units TABS Take by mouth. Take one tablet in the AM    . clopidogrel (PLAVIX) 75 MG tablet TAKE ONE (1) TABLET BY MOUTH EVERY DAY 90 tablet 3  . desmopressin (DDAVP) 0.2 MG  tablet Start 1 tablet nightly for 7 days and can increase to a maximum of 3 tablets over 3 weeks to decrease nocturia    . desonide (DESOWEN) 0.05 % cream Apply topically as needed.     [name] econazole nitrate 1 % cream Apply topically as needed.     [name] estradiol (ESTRACE) 0.1 MG/GM vaginal cream Place 1 Applicatorful vaginally at bedtime.    . fluticasone (FLONASE) 50 MCG/ACT nasal spray PLACE 2 SPRAYS INTO BOTH NOSTRILS DAILY. 48 g 3  . lisinopril (ZESTRIL) 10 MG tablet TAKE ONE (1) TABLET BY MOUTH EVERY DAY 90 tablet 3  . metroNIDAZOLE (METROCREAM) 0.75 % cream Apply topically 2 (two) times daily.    . mirabegron ER (MYRBETRIQ) 50 MG TB24 tablet Take 50 mg by mouth daily.     [name] omega-3 acid ethyl esters (LOVAZA) 1 g capsule Take 2 capsules (2 g total) by mouth 2 (two) times daily. 360 capsule 3  . oxyCODONE-acetaminophen (PERCOCET) 5-325 MG tablet Take 1 tablet by mouth every 8 (eight) hours as needed for severe pain. 15 tablet 0  . Probiotic Product (PROBIOTIC-10 PO) Take 1 capsule by mouth daily.    . PSYLLIUM PO Take by mouth.    . solifenacin (VESICARE) 5 MG tablet Take 5 mg by mouth daily.     No current facility-administered medications on file prior to visit.    PAST MEDICAL HISTORY: Past Medical History:  Diagnosis Date  . Acid reflux disease 03/09/2015  . Aortic stenosis 03/09/2015  . AR (allergic rhinitis) 03/09/2015  . Arthritis   . Back pain   . Controlled type 2 diabetes mellitus without complication, without long-term current use of insulin ([place]) 08/08/2016  . Cough   . 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 Decreased hearing   . Diabetes mellitus without complication ([place])   . Dry mouth   . Easy bruising   . Essential hypertension 08/08/2016  . Heart murmur   . 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 History of CVA (cerebrovascular accident) 08/08/2016   Seen on MRI from 08/2015  . Hyperlipidemia   . Hypertension   . 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 IBS  (irritable bowel syndrome) 07/16/2015  . Insomnia 07/16/2015  . Joint pain   . Knee pain   . Left ventricular hypertrophy 07/16/2015  . Leg cramping    right leg  . Mixed hyperlipidemia 08/08/2016  . Morbid obesity ([place]) 11/10/2015  . Nasal discharge   . Nuclear sclerotic cataract of left eye 07/07/2016  . OAB (overactive bladder) 01/20/2016  . OSA (obstructive sleep apnea) 03/09/2015  . Osteoporosis 03/09/2015  . 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 Pulmonary hypertension ([place]) 07/16/2015  . Red eyes   . Right kidney stone   . Shortness of breath on exertion   . Sleep apnea   . Stroke ([place])   . Swelling of both lower extremities   . UTI (urinary tract infection)   . Vitamin D deficiency 03/09/2015    PAST SURGICAL HISTORY: Past Surgical History:  Procedure Laterality  Date  . ABDOMINAL HYSTERECTOMY  09/14/2016  . CATARACT EXTRACTION Left   . CATARACT EXTRACTION Left 06/2016  . COLPORRHAPHY N/A 09/14/2016   [facility]  . CYSTOCELE REPAIR    . FRACTURE SURGERY Left 1953   L arm  . FRACTURE SURGERY Left 1985   L ankle  . HERNIA REPAIR  2004  . JOINT REPLACEMENT Right 2012   R TKA  . JOINT REPLACEMENT Left 2017  . KIDNEY STONE SURGERY    . LAPAROSCOPIC ASSISTED VAGINAL HYSTERECTOMY N/A 09/14/2016   [facility]  . R foot/ankle Right 2013 and 2014   plate and screws lateral foot and tendon removal medial foot in 2013, revision in 2014  . RECTOCELE REPAIR    . URETERAL STENT PLACEMENT    . URETEROSCOPY      SOCIAL HISTORY: Social History   Tobacco Use  . Smoking status: Never Smoker  . Smokeless tobacco: Never Used  Substance Use Topics  . Alcohol use: Yes    Alcohol/week: 1.0 standard drinks    Types: 1 Glasses of wine per week    Comment: couple of glasses per week  . Drug use: No    FAMILY HISTORY: Family History  Problem Relation Age of Onset  . Hyperlipidemia Mother   . Heart disease Mother   . Hypertension Mother   . Stroke Mother   . Thyroid disease Mother    . Hypertension Father   . Heart disease Father     ROS: Review of Systems  Constitutional: Positive for weight loss.  Gastrointestinal: Negative for nausea and vomiting.  Musculoskeletal:       Negative for muscle weakness  Psychiatric/Behavioral: The patient does not have insomnia.     PHYSICAL EXAM: There were no vitals taken for this visit. There is no height or weight on file to calculate BMI. Physical Exam Vitals reviewed.  Constitutional:      General: She is not in acute distress.    Appearance: Normal appearance. She is well-developed. She is obese.  Cardiovascular:     Rate and Rhythm: Normal rate.  Pulmonary:     Effort: Pulmonary effort is normal.  Musculoskeletal:        General: Normal range of motion.  Skin:    General: Skin is warm and dry.  Neurological:     Mental Status: She is alert and oriented to person, place, and time.  Psychiatric:        Mood and Affect: Mood normal.        Behavior: Behavior normal.     RECENT LABS AND TESTS: BMET    Component Value Date/Time   NA 136 12/31/2018 0932   NA 143 02/01/2018 0000   K 4.6 12/31/2018 0932   CL 100 12/31/2018 0932   CO2 28 12/31/2018 0932   GLUCOSE 119 (H) 12/31/2018 0932   BUN 14 12/31/2018 0932   BUN 13 02/01/2018 0000   CREATININE 0.56 12/31/2018 0932   CALCIUM 9.2 12/31/2018 0932   GFRNONAA 93 02/01/2018 0000   GFRAA 107 02/01/2018 0000   Lab Results  Component Value Date   HGBA1C 6.6 (H) 12/31/2018   HGBA1C 6.3 08/29/2018   HGBA1C 6.7 (H) 01/31/2018   HGBA1C 6.4 (H) 09/28/2017   HGBA1C 7.8 (H) 05/24/2017   Lab Results  Component Value Date   INSULIN 24.2 09/28/2017   INSULIN 17.6 06/21/2017   CBC    Component Value Date/Time   WBC 6.4 12/31/2018 0932   RBC 4.49 12/31/2018 0932  HGB 13.7 12/31/2018 0932   HGB WILL FOLLOW 02/01/2018 0000   HCT 41.2 12/31/2018 0932   HCT WILL FOLLOW 02/01/2018 0000   PLT 259.0 12/31/2018 0932   PLT WILL FOLLOW 02/01/2018 0000   MCV  91.8 12/31/2018 0932   MCV WILL FOLLOW 02/01/2018 0000   MCH WILL FOLLOW 02/01/2018 0000   MCHC 33.2 12/31/2018 0932   RDW 15.2 12/31/2018 0932   RDW WILL FOLLOW 02/01/2018 0000   LYMPHSABS WILL FOLLOW 02/01/2018 0000   EOSABS WILL FOLLOW 02/01/2018 0000   BASOSABS WILL FOLLOW 02/01/2018 0000   Iron/TIBC/Ferritin/ %Sat No results found for: IRON, TIBC, FERRITIN, IRONPCTSAT Lipid Panel     Component Value Date/Time   CHOL 148 12/31/2018 0932   CHOL 148 02/01/2018 0000   TRIG 162.0 (H) 12/31/2018 0932   HDL 46.60 12/31/2018 0932   HDL 51 02/01/2018 0000   CHOLHDL 3 12/31/2018 0932   VLDL 32.4 12/31/2018 0932   LDLCALC 69 12/31/2018 0932   LDLCALC 74 02/01/2018 0000   LDLDIRECT 66.0 12/14/2016 0856   Hepatic Function Panel     Component Value Date/Time   PROT 6.8 12/31/2018 0932   PROT 6.8 02/01/2018 0000   ALBUMIN 4.6 12/31/2018 0932   ALBUMIN 4.4 02/01/2018 0000   AST 18 12/31/2018 0932   ALT 13 12/31/2018 0932   ALKPHOS 60 12/31/2018 0932   BILITOT 1.0 12/31/2018 0932   BILITOT 0.9 02/01/2018 0000      Component Value Date/Time   TSH 1.610 06/21/2017 1057    Ref. Range 02/01/2018 00:00  Vitamin D, 25-Hydroxy Latest Ref Range: 30.0 - 100.0 ng/mL 40.4    I, Doreene Nest, am acting as Location manager for Dennard Nip, MD I have reviewed the above documentation for accuracy and completeness, and I agree with the above. -Dennard Nip, MD

## 2019-02-26 MED FILL — MYRBETRIQ ER 50 MG TABLET: 50 | 90 days supply | Qty: 90 | Fill #3

## 2019-02-26 MED FILL — DESMOPRESSIN ACETATE 0.2 MG: 0.2 | 30 days supply | Qty: 90 | Fill #1

## 2019-02-26 MED FILL — VIT D2 1.25 MG (50,000 UNIT: 1.25 MG | 28 days supply | Qty: 4 | Fill #0

## 2019-02-26 MED FILL — SOLIFENACIN SUCCINATE 5 MG: 5 | 90 days supply | Qty: 90 | Fill #3

## 2019-03-01 DIAGNOSIS — R399 Unspecified symptoms and signs involving the genitourinary system: Secondary | ICD-10-CM | POA: Diagnosis not present

## 2019-03-06 ENCOUNTER — Encounter (INDEPENDENT_AMBULATORY_CARE_PROVIDER_SITE_OTHER): Payer: Self-pay | Admitting: Family Medicine

## 2019-03-06 ENCOUNTER — Other Ambulatory Visit: Payer: Self-pay

## 2019-03-06 ENCOUNTER — Telehealth (INDEPENDENT_AMBULATORY_CARE_PROVIDER_SITE_OTHER): Payer: Medicare PPO | Admitting: Family Medicine

## 2019-03-06 DIAGNOSIS — F3289 Other specified depressive episodes: Secondary | ICD-10-CM | POA: Diagnosis not present

## 2019-03-06 DIAGNOSIS — Z6841 Body Mass Index (BMI) 40.0 and over, adult: Secondary | ICD-10-CM

## 2019-03-06 DIAGNOSIS — E559 Vitamin D deficiency, unspecified: Secondary | ICD-10-CM | POA: Diagnosis not present

## 2019-03-06 MED ORDER — VITAMIN D (ERGOCALCIFEROL) 1.25 MG (50000 UNIT) PO CAPS: 50000.0000 [IU] | ORAL_CAPSULE | ORAL | 0 refills | Status: DC

## 2019-03-06 MED ORDER — BUPROPION HCL ER (SR) 150 MG PO TB12: 150.0000 mg | ORAL_TABLET | Freq: Every day | ORAL | 0 refills | Status: DC

## 2019-03-06 MED FILL — BUPROPION HCL ER (SR) 150 M: 150 | 30 days supply | Qty: 30 | Fill #0

## 2019-03-07 LAB — VITAMIN D 25 HYDROXY (VIT D DEFICIENCY, FRACTURES): Vit D, 25-Hydroxy: 31.2 ng/mL (ref 30.0–100.0)

## 2019-03-07 NOTE — Progress Notes (Signed)
TeleHealth Visit:  Due to the COVID-19 pandemic, this visit was completed with telemedicine (audio/video) technology to reduce patient and provider exposure as well as to preserve personal protective equipment.   Jasmin [name] has verbally consented to this TeleHealth visit. The patient is located at home, the provider is located at the [employer] [facility] office. The participants in this visit include the listed provider and patient. Jasmin [name] was unable to use realtime audiovisual technology today and the telehealth visit was conducted via telephone.  Chief Complaint: OBESITY Jasmin [name] is here to discuss her progress with her obesity treatment plan along with follow-up of her obesity related diagnoses. Jasmin [name] is on the Category 2 Plan or keeping a food journal and adhering to recommended goals of 1000-1300 calories and 75+ grams of protein daily and states she is following her eating plan approximately 50% of the time. Jasmin [name] states she is walking 7,000-10,000 steps 7 times per week.  Today's visit was #: 16 Starting weight: 248 lbs Starting date: 06/21/17  Interim History: Jasmin [name] continues to do well with weight loss on her Category 2 plan. She thinks she has lost 2 more lbs even over Christmas and her birthday, which is excellent. Her hunger is controlled on her Category 2 plan.  Subjective:   1. Vitamin D deficiency Jasmin [name] is over due to have her Vit D level checked. She denies nausea, vomiting, or muscle weakness.  2. Other depression, emotional eating Jasmin [name]'s mood is stable on medications. She is doing well avoiding too much emotional eating. Her sleep has improved and she denies palpitations.  Assessment/Plan:   1. Vitamin D deficiency Low Vitamin D level contributes to fatigue and are associated with obesity, breast, and colon cancer. We will refill prescription Vit D for 1 month. We will check labs (patient is to try to come in this  week). She will follow-up for routine testing of vitamin D, at least 2-3 times per year to avoid over-replacement. We will continue to monitor.  - Vitamin D (25 hydroxy) - Vitamin D, Ergocalciferol, (DRISDOL) 1.25 MG (50000 UNIT) CAPS capsule; Take 1 capsule (50,000 Units total) by mouth every 7 (seven) days.  Dispense: 4 capsule; Refill: 0  2. Other depression, emotional eating Behavior modification techniques were discussed today to help Jasmin [name] deal with her emotional/non-hunger eating behaviors. We will refill Wellbutrin for 1 month. Orders and follow up as documented in patient record.   - buPROPion (WELLBUTRIN SR) 150 MG 12 hr tablet; Take 1 tablet (150 mg total) by mouth daily.  Dispense: 30 tablet; Refill: 0  3. Class 3 severe obesity with serious comorbidity and body mass index (BMI) of 45.0 to 49.9 in adult, unspecified obesity type ([place]) Cecille is currently in the action stage of change. As such, her goal is to continue with weight loss efforts. She has agreed to on the Category 2 Plan.   We discussed the following exercise goals today: We discussed the following behavioral modification strategies today: meal planning and cooking strategies and emotional eating strategies.  Jasmin [name] has agreed to follow-up with our clinic in 3 weeks. She was informed of the importance of frequent follow-up visits to maximize her success with intensive lifestyle modifications for her multiple health conditions.  Jasmin [name] was informed we would discuss her lab results at her next visit unless there is a critical issue that needs to be addressed sooner. Jasmin [name] agreed to keep her next visit at the agreed upon time  to discuss these results.  Objective:   VITALS: Per patient if applicable, see vitals. GENERAL: Alert and in no acute distress. CARDIOPULMONARY: No increased WOB. Speaking in clear sentences.  PSYCH: Pleasant and cooperative. Speech normal rate and rhythm.  Affect is appropriate. Insight and judgement are appropriate. Attention is focused, linear, and appropriate.  NEURO: Oriented as arrived to appointment on time with no prompting.   Lab Results  Component Value Date   CREATININE 0.56 12/31/2018   BUN 14 12/31/2018   NA 136 12/31/2018   K 4.6 12/31/2018   CL 100 12/31/2018   CO2 28 12/31/2018   Lab Results  Component Value Date   ALT 13 12/31/2018   AST 18 12/31/2018   ALKPHOS 60 12/31/2018   BILITOT 1.0 12/31/2018   Lab Results  Component Value Date   HGBA1C 6.6 (H) 12/31/2018   HGBA1C 6.3 08/29/2018   HGBA1C 6.7 (H) 01/31/2018   HGBA1C 6.4 (H) 09/28/2017   HGBA1C 7.8 (H) 05/24/2017   Lab Results  Component Value Date   INSULIN 24.2 09/28/2017   INSULIN 17.6 06/21/2017   Lab Results  Component Value Date   TSH 1.610 06/21/2017   Lab Results  Component Value Date   CHOL 148 12/31/2018   HDL 46.60 12/31/2018   LDLCALC 69 12/31/2018   LDLDIRECT 66.0 12/14/2016   TRIG 162.0 (H) 12/31/2018   CHOLHDL 3 12/31/2018   Lab Results  Component Value Date   WBC 6.4 12/31/2018   HGB 13.7 12/31/2018   HCT 41.2 12/31/2018   MCV 91.8 12/31/2018   PLT 259.0 12/31/2018   No results found for: IRON, TIBC, FERRITIN  Attestation Statements:   Reviewed by clinician on day of visit: allergies, medications, problem list, medical history, surgical history, family history, social history, and previous encounter notes.  Time spent on visit including pre-visit chart review and post-visit care was 30 minutes.   I, Trixie Dredge, am acting as transcriptionist for Dennard Nip, MD.  I have reviewed the above documentation for accuracy and completeness, and I agree with the above. - Dennard Nip, MD

## 2019-03-08 ENCOUNTER — Encounter: Payer: Self-pay | Admitting: Family Medicine

## 2019-03-08 ENCOUNTER — Other Ambulatory Visit: Payer: Self-pay

## 2019-03-08 ENCOUNTER — Ambulatory Visit (INDEPENDENT_AMBULATORY_CARE_PROVIDER_SITE_OTHER): Payer: Medicare PPO | Admitting: Family Medicine

## 2019-03-08 VITALS — Ht 61.0 in | Wt 236.0 lb

## 2019-03-08 DIAGNOSIS — R300 Dysuria: Secondary | ICD-10-CM

## 2019-03-08 LAB — POC URINALSYSI DIPSTICK (AUTOMATED)
Bilirubin, UA: NEGATIVE
Blood, UA: NEGATIVE
Glucose, UA: NEGATIVE
Ketones, UA: NEGATIVE
Nitrite, UA: NEGATIVE
Protein, UA: NEGATIVE
Spec Grav, UA: 1.015 (ref 1.010–1.025)
Urobilinogen, UA: 0.2 E.U./dL
pH, UA: 7 (ref 5.0–8.0)

## 2019-03-08 MED ORDER — PHENAZOPYRIDINE HCL 200 MG PO TABS: 200.0000 mg | ORAL_TABLET | Freq: Three times a day (TID) | ORAL | 0 refills | Status: DC | PRN

## 2019-03-08 MED ORDER — CEPHALEXIN 500 MG PO CAPS: 500.0000 mg | ORAL_CAPSULE | Freq: Two times a day (BID) | ORAL | 0 refills | Status: DC

## 2019-03-08 MED FILL — PHENAZOPYRIDINE 200 MG TAB: 200 | 2 days supply | Qty: 6 | Fill #0

## 2019-03-08 MED FILL — CEPHALEXIN 500 MG CAPSULE: 500 | 7 days supply | Qty: 14 | Fill #0

## 2019-03-08 NOTE — Progress Notes (Addendum)
Virtual Visit via Telephone Note  I connected with Jasmin [name] on 03/08/19 at 10:40 AM EST by telephone and verified that I am speaking with the correct person using two identifiers.  Location: Patient: home alone Provider: home    I discussed the limitations, risks, security and privacy concerns of performing an evaluation and management service by telephone and the availability of in person appointments. I also discussed with the patient that there may be a patient responsible charge related to this service. The patient expressed understanding and agreed to proceed.   History of Present Illness: Pt is home c/o dysuria --- she was seen by urology and put on macrobid for 5 days and repeat ua was neg but symptoms are back and her urologist is on vacation    Past Medical History:  Diagnosis Date  . Acid reflux disease 03/09/2015  . Aortic stenosis 03/09/2015  . AR (allergic rhinitis) 03/09/2015  . Arthritis   . Back pain   . Controlled type 2 diabetes mellitus without complication, without long-term current use of insulin ([place]) 08/08/2016  . Cough   . 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 Decreased hearing   . Diabetes mellitus without complication ([place])   . Dry mouth   . Easy bruising   . Essential hypertension 08/08/2016  . Heart murmur   . 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 History of CVA (cerebrovascular accident) 08/08/2016   Seen on MRI from 08/2015  . Hyperlipidemia   . Hypertension   . 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 IBS (irritable bowel syndrome) 07/16/2015  . Insomnia 07/16/2015  . Joint pain   . Knee pain   . Left ventricular hypertrophy 07/16/2015  . Leg cramping    right leg  . Mixed hyperlipidemia 08/08/2016  . Morbid obesity ([address]) 11/10/2015  . Nasal discharge   . Nuclear sclerotic cataract of left eye 07/07/2016  . OAB (overactive bladder) 01/20/2016  . OSA (obstructive sleep apnea) 03/09/2015  . Osteoporosis 03/09/2015  . Primary  osteoarthritis of left knee 03/31/2015  . Pulmonary hypertension ([place]) 07/16/2015  . Red eyes   . Right kidney stone   . Shortness of breath on exertion   . Sleep apnea   . Stroke ([place])   . Swelling of both lower extremities   . UTI (urinary tract infection)   . Vitamin D deficiency 03/09/2015   Current Outpatient Medications on File Prior to Visit  Medication Sig Dispense Refill  . acetaminophen (TYLENOL) 500 MG tablet Take by mouth as needed.     [name] amoxicillin (AMOXIL) 500 MG capsule Take prior to dentist procedure    . atorvastatin (LIPITOR) 20 MG tablet TAKE 1 TABLET BY MOUTH DAILY **NEED OFFICE VISIT FOR REFILLS** 90 tablet 3  . buPROPion (WELLBUTRIN SR) 150 MG 12 hr tablet Take 1 tablet (150 mg total) by mouth daily. 30 tablet 0  . CALCIUM PO Take 1 tablet by mouth daily.    . Cholecalciferol (VITAMIN D3) 2000 units TABS Take by mouth. Take one tablet in the AM    . clopidogrel (PLAVIX) 75 MG tablet TAKE ONE (1) TABLET BY MOUTH EVERY DAY 90 tablet 3  . desmopressin (DDAVP) 0.2 MG tablet Start 1 tablet nightly for 7 days and can increase to a maximum of 3 tablets over 3 weeks to decrease nocturia    . desonide (DESOWEN) 0.05 % cream Apply topically as needed.     [name] econazole nitrate 1 % cream Apply topically as needed.     [name]  estradiol (ESTRACE) 0.1 MG/GM vaginal cream Place 1 Applicatorful vaginally at bedtime.    . fluticasone (FLONASE) 50 MCG/ACT nasal spray PLACE 2 SPRAYS INTO BOTH NOSTRILS DAILY. 48 g 3  . lisinopril (ZESTRIL) 10 MG tablet TAKE ONE (1) TABLET BY MOUTH EVERY DAY 90 tablet 3  . metFORMIN (GLUCOPHAGE) 500 MG tablet Take 1 tablet (500 mg total) by mouth 2 (two) times daily with a meal. 180 tablet 3  . metroNIDAZOLE (METROCREAM) 0.75 % cream Apply topically 2 (two) times daily.    . mirabegron ER (MYRBETRIQ) 50 MG TB24 tablet Take 50 mg by mouth daily.     [name] omega-3 acid ethyl esters (LOVAZA) 1 g capsule Take 2 capsules (2 g total) by mouth 2 (two) times daily. 360  capsule 3  . oxyCODONE-acetaminophen (PERCOCET) 5-325 MG tablet Take 1 tablet by mouth every 8 (eight) hours as needed for severe pain. 15 tablet 0  . Probiotic Product (PROBIOTIC-10 PO) Take 1 capsule by mouth daily.    . PSYLLIUM PO Take by mouth.    . solifenacin (VESICARE) 5 MG tablet Take 5 mg by mouth daily.    . Vitamin D, Ergocalciferol, (DRISDOL) 1.25 MG (50000 UNIT) CAPS capsule Take 1 capsule (50,000 Units total) by mouth every 7 (seven) days. 4 capsule 0   No current facility-administered medications on file prior to visit.   Allergies  Allergen Reactions  . Latex Itching  . Ciprofloxacin Hives  . Metronidazole Hives  . Adhesive [Tape] Rash    Observations/Objective: There were no vitals filed for this visit. No fevers per pt Pt in NAD  Assessment and Plan: 1. Dysuria abx and pyridium per orders Pt will drop off urine specimen today - cephALEXin (KEFLEX) 500 MG capsule; Take 1 capsule (500 mg total) by mouth 2 (two) times daily.  Dispense: 14 capsule; Refill: 0 - phenazopyridine (PYRIDIUM) 200 MG tablet; Take 1 tablet (200 mg total) by mouth 3 (three) times daily as needed for pain.  Dispense: 6 tablet; Refill: 0 - POCT Urinalysis Dipstick (Automated); Future - Urine culture; Future   Follow Up Instructions:    I discussed the assessment and treatment plan with the patient. The patient was provided an opportunity to ask questions and all were answered. The patient agreed with the plan and demonstrated an understanding of the instructions.   The patient was advised to call back or seek an in-person evaluation if the symptoms worsen or if the condition fails to improve as anticipated.  Phone call lasted 20 min   Ann Held, DO

## 2019-03-08 NOTE — Addendum Note (Signed)
Addended by: Kelle Darting A on: 03/08/2019 11:24 AM   Modules accepted: Orders

## 2019-03-09 LAB — URINE CULTURE
MICRO NUMBER:: 10046824
SPECIMEN QUALITY:: ADEQUATE

## 2019-03-19 DIAGNOSIS — R399 Unspecified symptoms and signs involving the genitourinary system: Secondary | ICD-10-CM | POA: Diagnosis not present

## 2019-03-19 MED FILL — VIT D2 1.25 MG (50,000 UNIT: 1.25 MG | 28 days supply | Qty: 4 | Fill #0

## 2019-03-20 MED FILL — SULFAMETHOXAZOLE-TMP DS TAB: 800-160 | 5 days supply | Qty: 10 | Fill #0

## 2019-03-27 ENCOUNTER — Telehealth (INDEPENDENT_AMBULATORY_CARE_PROVIDER_SITE_OTHER): Payer: Medicare PPO | Admitting: Family Medicine

## 2019-03-30 MED FILL — LISINOPRIL 10 MG TABS: 10 | 90 days supply | Qty: 90 | Fill #1

## 2019-04-03 ENCOUNTER — Telehealth (INDEPENDENT_AMBULATORY_CARE_PROVIDER_SITE_OTHER): Payer: Medicare PPO | Admitting: Family Medicine

## 2019-04-03 DIAGNOSIS — N8110 Cystocele, unspecified: Secondary | ICD-10-CM | POA: Diagnosis not present

## 2019-04-03 DIAGNOSIS — N3281 Overactive bladder: Secondary | ICD-10-CM | POA: Diagnosis not present

## 2019-04-03 MED FILL — DESMOPRESSIN ACETATE 0.2 MG: 0.2 | 30 days supply | Qty: 90 | Fill #2

## 2019-04-17 ENCOUNTER — Telehealth (INDEPENDENT_AMBULATORY_CARE_PROVIDER_SITE_OTHER): Payer: Medicare PPO | Admitting: Family Medicine

## 2019-04-17 ENCOUNTER — Other Ambulatory Visit: Payer: Self-pay

## 2019-04-17 ENCOUNTER — Encounter (INDEPENDENT_AMBULATORY_CARE_PROVIDER_SITE_OTHER): Payer: Self-pay | Admitting: Family Medicine

## 2019-04-17 DIAGNOSIS — E559 Vitamin D deficiency, unspecified: Secondary | ICD-10-CM | POA: Diagnosis not present

## 2019-04-17 DIAGNOSIS — F3289 Other specified depressive episodes: Secondary | ICD-10-CM | POA: Diagnosis not present

## 2019-04-17 DIAGNOSIS — Z6841 Body Mass Index (BMI) 40.0 and over, adult: Secondary | ICD-10-CM | POA: Diagnosis not present

## 2019-04-17 MED ORDER — VITAMIN D (ERGOCALCIFEROL) 1.25 MG (50000 UNIT) PO CAPS: 50000.0000 [IU] | ORAL_CAPSULE | ORAL | 0 refills | Status: DC

## 2019-04-17 MED ORDER — BUPROPION HCL ER (SR) 150 MG PO TB12: 150.0000 mg | ORAL_TABLET | Freq: Every day | ORAL | 0 refills | Status: DC

## 2019-04-17 MED FILL — VIT D2 1.25 MG (50,000 UNIT: 1.25 MG | 28 days supply | Qty: 4 | Fill #0

## 2019-04-17 MED FILL — BUPROPION HCL ER (SR) 150 M: 150 | 30 days supply | Qty: 30 | Fill #0

## 2019-04-17 NOTE — Progress Notes (Signed)
TeleHealth Visit:  Due to the COVID-19 pandemic, this visit was completed with telemedicine (audio/video) technology to reduce patient and provider exposure as well as to preserve personal protective equipment.   Jasmin [name] has verbally consented to this TeleHealth visit. The patient is located at [name], the provider is located at the [facility] office. The participants in this visit include the listed provider and patient. The visit was conducted today via face time.   Chief Complaint: OBESITY Jasmin [name] is here to discuss her progress with her obesity treatment plan along with follow-up of her obesity related diagnoses. Jasmin [name] is on the Category 2 Plan and states she is following her eating plan approximately 50% of the time. Jasmin [name] states she is walking occasionally.  Today's visit was #: 17 Starting weight: 248 lbs Starting date: 06/21/17  Interim History: Jasmin [name] has done well maintaining her weight. She is ready to start gardening for exercise and she is looking forward to being more active this Spring. She has had her second Gideon immunization and she is looking forward to leaving the house again with her mask to go grocery shopping, etc. She states her blood sugar was 109 this morning, and weight of 235 lbs today.  Subjective:   1. Vitamin D deficiency Jasmin [name] is stable on Vit D and she requests a refill today.  2. Other depression, emotional eating Jasmin [name] is stable on Wellbutrin, and she is working on decreasing emotional eating. She requests a refill today.  Assessment/Plan:   1. Vitamin D deficiency Low Vitamin D level contributes to fatigue and are associated with obesity, breast, and colon cancer. We will refill prescription Vitamin D for 1 month. Jasmin [name] will follow-up for routine testing of Vitamin D, at least 2-3 times per year to avoid over-replacement.  - Vitamin D, Ergocalciferol, (DRISDOL) 1.25 MG (50000 UNIT) CAPS capsule; Take 1 capsule (50,000 Units  total) by mouth every 7 (seven) days.  Dispense: 4 capsule; Refill: 0  2. Other depression, emotional eating Behavior modification techniques were discussed today to help Jasmin [name] deal with her emotional/non-hunger eating behaviors. We will refill Wellbutrin for 1 month. Orders and follow up as documented in patient record.   - buPROPion (WELLBUTRIN SR) 150 MG 12 hr tablet; Take 1 tablet (150 mg total) by mouth daily.  Dispense: 30 tablet; Refill: 0  3. Class 3 severe obesity with serious comorbidity and body mass index (BMI) of 45.0 to 49.9 in adult, unspecified obesity type (Jasmin [name]) Jasmin [name] is currently in the action stage of change. As such, her goal is to continue with weight loss efforts. She has agreed to the Category 2 Plan.   Exercise goals: Jasmin [name] is to continue her current exercise regimen as is.  Behavioral modification strategies: meal planning and cooking strategies and better snacking choices.  Jasmin [name] has agreed to follow-up with our clinic in 3 weeks. She was informed of the importance of frequent follow-up visits to maximize her success with intensive lifestyle modifications for her multiple health conditions.  Objective:   VITALS: Per patient if applicable, see vitals. GENERAL: Alert and in no acute distress. CARDIOPULMONARY: No increased WOB. Speaking in clear sentences.  PSYCH: Pleasant and cooperative. Speech normal rate and rhythm. Affect is appropriate. Insight and judgement are appropriate. Attention is focused, linear, and appropriate.  NEURO: Oriented as arrived to appointment on time with no prompting.   Lab Results  Component Value Date   CREATININE 0.56 12/31/2018   BUN 14 12/31/2018   NA 136 12/31/2018  K 4.6 12/31/2018   CL 100 12/31/2018   CO2 28 12/31/2018   Lab Results  Component Value Date   ALT 13 12/31/2018   AST 18 12/31/2018   ALKPHOS 60 12/31/2018   BILITOT 1.0 12/31/2018   Lab Results  Component Value Date   HGBA1C 6.6 (H) 12/31/2018    HGBA1C 6.3 08/29/2018   HGBA1C 6.7 (H) 01/31/2018   HGBA1C 6.4 (H) 09/28/2017   HGBA1C 7.8 (H) 05/24/2017   Lab Results  Component Value Date   INSULIN 24.2 09/28/2017   INSULIN 17.6 06/21/2017   Lab Results  Component Value Date   TSH 1.610 06/21/2017   Lab Results  Component Value Date   CHOL 148 12/31/2018   HDL 46.60 12/31/2018   LDLCALC 69 12/31/2018   LDLDIRECT 66.0 12/14/2016   TRIG 162.0 (H) 12/31/2018   CHOLHDL 3 12/31/2018   Lab Results  Component Value Date   WBC 6.4 12/31/2018   HGB 13.7 12/31/2018   HCT 41.2 12/31/2018   MCV 91.8 12/31/2018   PLT 259.0 12/31/2018   No results found for: IRON, TIBC, FERRITIN  Attestation Statements:   Reviewed by clinician on day of visit: allergies, medications, problem list, medical history, surgical history, family history, social history, and previous encounter notes.   I, Trixie Dredge, am acting as transcriptionist for Dennard Nip, MD.  I have reviewed the above documentation for accuracy and completeness, and I agree with the above. - Dennard Nip, MD

## 2019-04-30 MED FILL — DESMOPRESSIN ACETATE 0.2 MG: 0.2 | 30 days supply | Qty: 90 | Fill #3

## 2019-05-01 ENCOUNTER — Ambulatory Visit (INDEPENDENT_AMBULATORY_CARE_PROVIDER_SITE_OTHER): Payer: Medicare PPO | Admitting: Family Medicine

## 2019-05-01 ENCOUNTER — Encounter (INDEPENDENT_AMBULATORY_CARE_PROVIDER_SITE_OTHER): Payer: Self-pay | Admitting: Family Medicine

## 2019-05-01 ENCOUNTER — Other Ambulatory Visit: Payer: Self-pay

## 2019-05-01 VITALS — BP 143/76 | HR 78 | Temp 98.2°F | Ht 61.0 in | Wt 242.0 lb

## 2019-05-01 DIAGNOSIS — E559 Vitamin D deficiency, unspecified: Secondary | ICD-10-CM | POA: Diagnosis not present

## 2019-05-01 DIAGNOSIS — Z6841 Body Mass Index (BMI) 40.0 and over, adult: Secondary | ICD-10-CM | POA: Diagnosis not present

## 2019-05-01 DIAGNOSIS — F3289 Other specified depressive episodes: Secondary | ICD-10-CM | POA: Diagnosis not present

## 2019-05-01 MED ORDER — VITAMIN D (ERGOCALCIFEROL) 1.25 MG (50000 UNIT) PO CAPS: 50000.0000 [IU] | ORAL_CAPSULE | ORAL | 0 refills | Status: DC

## 2019-05-01 MED ORDER — BUPROPION HCL ER (SR) 150 MG PO TB12: 150.0000 mg | ORAL_TABLET | Freq: Every day | ORAL | 0 refills | Status: DC

## 2019-05-01 NOTE — Progress Notes (Signed)
Chief Complaint:   OBESITY Jasmin [name] is here to discuss her progress with her obesity treatment plan along with follow-up of her obesity related diagnoses. Jasmin [name] is on the Category 2 Plan and states she is following her eating plan approximately 50% of the time. Jasmin [name] states she is walking 10,000 steps 7 times per week.  Today's visit was #: 18 Starting weight: 248 lbs Starting date: 06/21/2017 Today's weight: 242 lbs Today's date: 05/01/2019 Total lbs lost to date: 6 Total lbs lost since last in-office visit: 0  Interim History: Jasmin [name] has struggled with increased temptations and increased snacking, especially with boredom. She is trying to be more active since the weather has improved and she is ready to get back on track with her eating plan.  Subjective:   1. Vitamin D deficiency Jasmin [name] is stable on Vit D, but her level is not yet at goal. She denies nausea or vomiting. She requests a refill today.  2. Other depression, emotional eating Jasmin [name] notes increased emotional eating especially with increased boredom. Otherwise she is doing well on her medications.  Assessment/Plan:   1. Vitamin D deficiency Low Vitamin D level contributes to fatigue and are associated with obesity, breast, and colon cancer. We will refill prescription Vitamin D for 1 month. Jasmin [name] will follow-up for routine testing of Vitamin D, at least 2-3 times per year to avoid over-replacement.  - Vitamin D, Ergocalciferol, (DRISDOL) 1.25 MG (50000 UNIT) CAPS capsule; Take 1 capsule (50,000 Units total) by mouth every 7 (seven) days.  Dispense: 4 capsule; Refill: 0  2. Other depression, emotional eating Emotional and boredom eating strategies were discussed today to help Jasmin [name] deal with her emotional/non-hunger eating behaviors. We will refill Wellbutrin for 1 month. Orders and follow up as documented in patient record.   - buPROPion (WELLBUTRIN SR) 150 MG 12 hr tablet; Take 1 tablet (150 mg total) by  mouth daily.  Dispense: 30 tablet; Refill: 0  3. Class 3 severe obesity with serious comorbidity and body mass index (BMI) of 45.0 to 49.9 in adult, unspecified obesity type (Jasmin [name]) Jasmin [name] is currently in the action stage of change. As such, her goal is to continue with weight loss efforts. She has agreed to the Category 2 Plan or keeping a food journal and adhering to recommended goals of 1000-1300 calories and 80+ grams of protein daily.   Exercise goals: As is.  Behavioral modification strategies: increasing lean protein intake and keeping a strict food journal.  Jasmin [name] has agreed to follow-up with our clinic in 2 weeks. She was informed of the importance of frequent follow-up visits to maximize her success with intensive lifestyle modifications for her multiple health conditions.   Objective:   Blood pressure (!) 143/76, pulse 78, temperature 98.2 F (36.8 C), temperature source Oral, height 5\' 1"  (1.549 m), weight 242 lb (109.8 kg), SpO2 98 %. Body mass index is 45.73 kg/m.  General: Cooperative, alert, well developed, in no acute distress. HEENT: Conjunctivae and lids unremarkable. Cardiovascular: Regular rhythm.  Lungs: Normal work of breathing. Neurologic: No focal deficits.   Lab Results  Component Value Date   CREATININE 0.56 12/31/2018   BUN 14 12/31/2018   NA 136 12/31/2018   K 4.6 12/31/2018   CL 100 12/31/2018   CO2 28 12/31/2018   Lab Results  Component Value Date   ALT 13 12/31/2018   AST 18 12/31/2018   ALKPHOS 60 12/31/2018   BILITOT 1.0 12/31/2018   Lab Results  Component Value  Date   HGBA1C 6.6 (H) 12/31/2018   HGBA1C 6.3 08/29/2018   HGBA1C 6.7 (H) 01/31/2018   HGBA1C 6.4 (H) 09/28/2017   HGBA1C 7.8 (H) 05/24/2017   Lab Results  Component Value Date   INSULIN 24.2 09/28/2017   INSULIN 17.6 06/21/2017   Lab Results  Component Value Date   TSH 1.610 06/21/2017   Lab Results  Component Value Date   CHOL 148 12/31/2018   HDL 46.60  12/31/2018   LDLCALC 69 12/31/2018   LDLDIRECT 66.0 12/14/2016   TRIG 162.0 (H) 12/31/2018   CHOLHDL 3 12/31/2018   Lab Results  Component Value Date   WBC 6.4 12/31/2018   HGB 13.7 12/31/2018   HCT 41.2 12/31/2018   MCV 91.8 12/31/2018   PLT 259.0 12/31/2018   No results found for: IRON, TIBC, FERRITIN  Obesity Behavioral Intervention Documentation for Insurance:   Approximately 15 minutes were spent on the discussion below.  Jasmin [name]: We discussed the diagnosis of obesity with Jasmin [name] today and Jasmin [name] agreed to give [name] permission to discuss obesity behavioral modification therapy today.  ASSESS: Jasmin [name] has the diagnosis of obesity and her BMI today is 45.75. Jasmin [name] is in the action stage of change.   ADVISE: Jasmin [name] was educated on the multiple health risks of obesity as well as the benefit of weight loss to improve her health. She was advised of the need for long term treatment and the importance of lifestyle modifications to improve her current health and to decrease her risk of future health problems.  AGREE: Multiple dietary modification options and treatment options were discussed and Jasmin [name] agreed to follow the recommendations documented in the above note.  ARRANGE: Jasmin [name] was educated on the importance of frequent visits to treat obesity as outlined per CMS and USPSTF guidelines and agreed to schedule her next follow up appointment today.  Attestation Statements:   Reviewed by clinician on day of visit: allergies, medications, problem list, medical history, surgical history, family history, social history, and previous encounter notes.   I, Trixie Dredge, am acting as transcriptionist for Dennard Nip, MD.  I have reviewed the above documentation for accuracy and completeness, and I agree with the above. -  Dennard Nip, MD

## 2019-05-08 MED FILL — VIT D2 1.25 MG (50,000 UNIT: 1.25 MG | 28 days supply | Qty: 4 | Fill #0

## 2019-05-10 MED FILL — BUPROPION HCL ER (SR) 150 M: 150 | 30 days supply | Qty: 30 | Fill #0

## 2019-05-15 ENCOUNTER — Ambulatory Visit (INDEPENDENT_AMBULATORY_CARE_PROVIDER_SITE_OTHER): Payer: Medicare PPO | Admitting: Family Medicine

## 2019-05-16 ENCOUNTER — Encounter: Payer: Self-pay | Admitting: Family Medicine

## 2019-05-16 ENCOUNTER — Other Ambulatory Visit (HOSPITAL_BASED_OUTPATIENT_CLINIC_OR_DEPARTMENT_OTHER): Payer: Self-pay | Admitting: Family Medicine

## 2019-05-16 DIAGNOSIS — Z1231 Encounter for screening mammogram for malignant neoplasm of breast: Secondary | ICD-10-CM

## 2019-05-16 MED FILL — METFORMIN HCL 500 MG TABS: 500 | 90 days supply | Qty: 180 | Fill #1

## 2019-05-16 MED FILL — OMEGA-3-ACID ETHYL ESTERS 1: 1 | 90 days supply | Qty: 360 | Fill #3

## 2019-05-16 MED FILL — ATORVASTATIN 20 MG TABLET: 20 | 90 days supply | Qty: 90 | Fill #1

## 2019-05-16 MED FILL — CLOPIDOGREL 75 MG TABLET: 75 | 90 days supply | Qty: 90 | Fill #3

## 2019-05-17 ENCOUNTER — Ambulatory Visit (HOSPITAL_BASED_OUTPATIENT_CLINIC_OR_DEPARTMENT_OTHER)
Admission: RE | Admit: 2019-05-17 | Discharge: 2019-05-17 | Disposition: A | Discharge status: Home / Self Care | Payer: Medicare PPO | Source: Ambulatory Visit | Attending: Family Medicine | Admitting: Family Medicine

## 2019-05-17 ENCOUNTER — Other Ambulatory Visit: Payer: Self-pay

## 2019-05-17 DIAGNOSIS — Z1231 Encounter for screening mammogram for malignant neoplasm of breast: Secondary | ICD-10-CM

## 2019-05-20 NOTE — Progress Notes (Signed)
Cardiology Office Note:    Date:  05/21/2019   ID:  [name], DOB Jun 26, 1945, MRN [id number]  PCP:  Jasmin Mclean, MD  Cardiologist:  Jasmin More, MD    Referring MD: Jasmin Mclean, MD    ASSESSMENT:    1. Nonrheumatic aortic valve stenosis   2. Nonrheumatic aortic valve insufficiency   3. Essential hypertension   4. Mixed hyperlipidemia   5. Myopathy    PLAN:    In order of problems listed above:  1. For mixed aortic stenosis aortic regurgitation check echocardiogram this summer I doubt she has progressed clinically and plan to see in 1 year and continue endocarditis prophylaxis 2. Blood pressure is well controlled I encouraged her to continue weight loss endeavors and ACE inhibitor last labs November 2019 showed normal renal function. 3. Stop statin with concern of proximal myopathy: 3 weeks if unimproved resume her statin if improved consider Zetia or PCSK9 therapy   Next appointment: 1 year   Medication Adjustments/Labs and Tests Ordered: Current medicines are reviewed at length with the patient today.  Concerns regarding medicines are outlined above.  No orders of the defined types were placed in this encounter.  No orders of the defined types were placed in this encounter.   Chief Complaint  Patient presents with  . Follow-up    For mixed aortic stenosis and regurgitation    History of Present Illness:    Jasmin [name] is a 74 y.o. female with a hx of aortic stenosis hypertension hyperlipidemia last seen 07/12/2017.  Echocardiogram performed 07/31/2017 showed normal left ventricular size and function mild aortic stenosis and mild aortic regurgitation. Compliance with diet, lifestyle and medications: Yes  She follows endocarditis prophylaxis and is not having chest pain shortness of breath or syncope no fever or chills.  She is on a statin and notices weakness when she tries to get out of a chair and climb stairs she may have myopathy she will  stop her statin for 3 weeks and send a MyChart message and if improved we will have to use nonstatin therapy for hyperlipidemia.  Is been 2 years we will recheck an echocardiogram.  I am pleased that she is involved in a supervised weight loss program I encouraged her to continue the same. Past Medical History:  Diagnosis Date  . Acid reflux disease 03/09/2015  . Aortic stenosis 03/09/2015  . AR (allergic rhinitis) 03/09/2015  . Arthritis   . Back pain   . Controlled type 2 diabetes mellitus without complication, without long-term current use of insulin ([place]) 08/08/2016  . Cough   . 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 Decreased hearing   . Diabetes mellitus without complication ([place])   . Dry mouth   . Easy bruising   . Essential hypertension 08/08/2016  . Heart murmur   . 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 History of CVA (cerebrovascular accident) 08/08/2016   Seen on MRI from 08/2015  . Hyperlipidemia   . Hypertension   . 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 IBS (irritable bowel syndrome) 07/16/2015  . Insomnia 07/16/2015  . Joint pain   . Knee pain   . Left ventricular hypertrophy 07/16/2015  . Leg cramping    right leg  . Mixed hyperlipidemia 08/08/2016  . Morbid obesity ([place]) 11/10/2015  . Nasal discharge   . Nuclear sclerotic cataract of left eye 07/07/2016  . OAB (overactive bladder) 01/20/2016  . OSA (obstructive sleep apnea) 03/09/2015  . Osteoporosis 03/09/2015  .  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 Pulmonary hypertension ([place]) 07/16/2015  . Red eyes   . Right kidney stone   . Shortness of breath on exertion   . Sleep apnea   . Stroke ([place])   . Swelling of both lower extremities   . UTI (urinary tract infection)   . Vitamin D deficiency 03/09/2015    Past Surgical History:  Procedure Laterality Date  . ABDOMINAL HYSTERECTOMY  09/14/2016  . CATARACT EXTRACTION Left   . CATARACT EXTRACTION Left 06/2016  . COLPORRHAPHY N/A 09/14/2016   [facility]    . CYSTOCELE REPAIR    . FRACTURE SURGERY Left 1953   L arm  . FRACTURE SURGERY Left 1985   L ankle  . HERNIA REPAIR  2004  . JOINT REPLACEMENT Right 2012   R TKA  . JOINT REPLACEMENT Left 2017  . KIDNEY STONE SURGERY    . LAPAROSCOPIC ASSISTED VAGINAL HYSTERECTOMY N/A 09/14/2016   [facility]  . R foot/ankle Right 2013 and 2014   plate and screws lateral foot and tendon removal medial foot in 2013, revision in 2014  . RECTOCELE REPAIR    . URETERAL STENT PLACEMENT    . URETEROSCOPY      Current Medications: Current Meds  Medication Sig  . amoxicillin (AMOXIL) 500 MG capsule Take prior to dentist procedure  . atorvastatin (LIPITOR) 20 MG tablet TAKE 1 TABLET BY MOUTH DAILY **NEED OFFICE VISIT FOR REFILLS**  . buPROPion (WELLBUTRIN SR) 150 MG 12 hr tablet Take 1 tablet (150 mg total) by mouth daily.  [name] CALCIUM PO Take 1 tablet by mouth daily.  . cephALEXin (KEFLEX) 500 MG capsule Take 1 capsule (500 mg total) by mouth 2 (two) times daily. (Patient taking differently: Take 500 mg by mouth at bedtime. )  . Cholecalciferol (VITAMIN D3) 2000 units TABS Take by mouth. Take one tablet in the AM  . clopidogrel (PLAVIX) 75 MG tablet TAKE ONE (1) TABLET BY MOUTH EVERY DAY  . desmopressin (DDAVP) 0.2 MG tablet Start 1 tablet nightly for 7 days and can increase to a maximum of 3 tablets over 3 weeks to decrease nocturia  . desonide (DESOWEN) 0.05 % cream Apply topically as needed.   [name] econazole nitrate 1 % cream Apply topically as needed.   [name] estradiol (ESTRACE) 0.1 MG/GM vaginal cream Place 1 Applicatorful vaginally at bedtime.  . fluticasone (FLONASE) 50 MCG/ACT nasal spray PLACE 2 SPRAYS INTO BOTH NOSTRILS DAILY.  [name] lisinopril (ZESTRIL) 10 MG tablet TAKE ONE (1) TABLET BY MOUTH EVERY DAY  . metFORMIN (GLUCOPHAGE) 500 MG tablet Take 1 tablet (500 mg total) by mouth 2 (two) times daily with a meal.  . metroNIDAZOLE (METROCREAM) 0.75 % cream Apply topically 2 (two) times daily.  . mirabegron ER  (MYRBETRIQ) 50 MG TB24 tablet Take 50 mg by mouth daily.   . naproxen sodium (ALEVE) 220 MG tablet Take 220 mg by mouth as needed.  [name] omega-3 acid ethyl esters (LOVAZA) 1 g capsule Take 2 capsules (2 g total) by mouth 2 (two) times daily.  [name] oxyCODONE-acetaminophen (PERCOCET) 5-325 MG tablet Take 1 tablet by mouth every 8 (eight) hours as needed for severe pain.  . Probiotic Product (PROBIOTIC-10 PO) Take 1 capsule by mouth daily.  . PSYLLIUM PO Take by mouth.  . solifenacin (VESICARE) 5 MG tablet Take 5 mg by mouth daily.  . Vitamin D, Ergocalciferol, (DRISDOL) 1.25 MG (50000 UNIT) CAPS capsule Take 1 capsule (50,000 Units total) by mouth every  7 (seven) days.  . [DISCONTINUED] acetaminophen (TYLENOL) 500 MG tablet Take by mouth as needed.   . [DISCONTINUED] albuterol (ACCUNEB) 0.63 MG/3ML nebulizer solution Inhale into the lungs.  . [DISCONTINUED] HYDROcodone-acetaminophen (NORCO/VICODIN) 5-325 MG tablet Take by mouth every 6 (six) hours as needed.  . [DISCONTINUED] Omega-3 Fatty Acids (FISH OIL) 1000 MG CAPS TAKE 2 CAPSULES BY MOUTH TWICE DAILY     Allergies:   Latex, Ciprofloxacin, Metronidazole, and Adhesive [tape]   Social History   Socioeconomic History  . Marital status: Married    Spouse name: Tempy Alcindor  . Number of children: 1  . Years of education: Not on file  . Highest education level: Not on file  Occupational History  . Occupation: Retired Pharmacist, hospital  Tobacco Use  . Smoking status: Never Smoker  . Smokeless tobacco: Never Used  Substance and Sexual Activity  . Alcohol use: Yes    Alcohol/week: 2.0 - 3.0 standard drinks    Types: 2 - 3 Glasses of wine per week    Comment: couple of glasses per week  . Drug use: No  . Sexual activity: Not Currently  Other Topics Concern  . Not on file  Social History Narrative  . Not on file   Social Determinants of Health   Financial Resource Strain:   . Difficulty of Paying Living Expenses:   Food Insecurity:   .  Worried About Charity fundraiser in the Last Year:   . Arboriculturist in the Last Year:   Transportation Needs:   . Film/video editor (Medical):   [name] Lack of Transportation (Non-Medical):   Physical Activity:   . Days of Exercise per Week:   . Minutes of Exercise per Session:   Stress:   . Feeling of Stress :   Social Connections:   . Frequency of Communication with Friends and Family:   . Frequency of Social Gatherings with Friends and Family:   . Attends Religious Services:   . Active Member of Clubs or Organizations:   . Attends Archivist Meetings:   [name] Marital Status:      Family History: The patient's family history includes Heart disease in her father and mother; Hyperlipidemia in her mother; Hypertension in her father and mother; Stroke in her mother; Thyroid disease in her mother. ROS:   Please see the history of present illness.    All other systems reviewed and are negative.  EKGs/Labs/Other Studies Reviewed:    The following studies were reviewed today:  EKG:  EKG ordered today and personally reviewed.  The ekg ordered today demonstrates sinus rhythm right bundle branch block new since 2018  Recent Labs: 12/31/2018: ALT 13; BUN 14; Creatinine, Ser 0.56; Hemoglobin 13.7; Platelets 259.0; Potassium 4.6; Sodium 136  Recent Lipid Panel    Component Value Date/Time   CHOL 148 12/31/2018 0932   CHOL 148 02/01/2018 0000   TRIG 162.0 (H) 12/31/2018 0932   HDL 46.60 12/31/2018 0932   HDL 51 02/01/2018 0000   CHOLHDL 3 12/31/2018 0932   VLDL 32.4 12/31/2018 0932   LDLCALC 69 12/31/2018 0932   LDLCALC 74 02/01/2018 0000   LDLDIRECT 66.0 12/14/2016 0856    Physical Exam:    VS:  BP 126/74 (BP Location: Left Arm, Patient Position: Sitting, Cuff Size: Large)   Pulse 84   Ht 5\' 1"  (1.549 m)   Wt 249 lb (112.9 kg)   SpO2 97%   BMI 47.05 kg/m     Wt  Readings from Last 3 Encounters:  05/21/19 249 lb (112.9 kg)  05/01/19 242 lb (109.8 kg)  03/08/19  236 lb (107 kg)     GEN:  Well nourished, well developed in no acute distress HEENT: Normal NECK: No JVD; No carotid bruits LYMPHATICS: No lymphadenopathy CARDIAC: RRR, she has a grade 1-2 soft midsystolic ejection murmur no AR located in the aortic area radiates to the right clavicle S2 is normal, rubs, gallops RESPIRATORY:  Clear to auscultation without rales, wheezing or rhonchi  ABDOMEN: Soft, non-tender, non-distended MUSCULOSKELETAL:  No edema; No deformity  SKIN: Warm and dry NEUROLOGIC:  Alert and oriented x 3 PSYCHIATRIC:  Normal affect    Signed, Jasmin More, MD  05/21/2019 8:40 AM    [place]

## 2019-05-21 ENCOUNTER — Encounter: Payer: Self-pay | Admitting: Cardiology

## 2019-05-21 ENCOUNTER — Ambulatory Visit: Payer: Medicare PPO | Admitting: Cardiology

## 2019-05-21 ENCOUNTER — Other Ambulatory Visit: Payer: Self-pay

## 2019-05-21 VITALS — BP 126/74 | HR 84 | Ht 61.0 in | Wt 249.0 lb

## 2019-05-21 DIAGNOSIS — I351 Nonrheumatic aortic (valve) insufficiency: Secondary | ICD-10-CM | POA: Diagnosis not present

## 2019-05-21 DIAGNOSIS — G729 Myopathy, unspecified: Secondary | ICD-10-CM

## 2019-05-21 DIAGNOSIS — I10 Essential (primary) hypertension: Secondary | ICD-10-CM

## 2019-05-21 DIAGNOSIS — I350 Nonrheumatic aortic (valve) stenosis: Secondary | ICD-10-CM | POA: Diagnosis not present

## 2019-05-21 DIAGNOSIS — E782 Mixed hyperlipidemia: Secondary | ICD-10-CM

## 2019-05-21 NOTE — Patient Instructions (Signed)
Medication Instructions:  Stop your atorvastatin, call us or send a Mychart message in 3 weeks and let us know if this helps with your muscle aches   *If you need a refill on your cardiac medications before your next appointment, please call your pharmacy*   Lab Work: None ordered   If you have labs (blood work) drawn today and your tests are completely normal, you will receive your results only by: [name] MyChart Message (if you have MyChart) OR . A paper copy in the mail If you have any lab test that is abnormal or we need to change your treatment, we will call you to review the results.   Testing/Procedures: Your physician has requested that you have an echocardiogram in June. Echocardiography is a painless test that uses sound waves to create images of your heart. It provides your doctor with information about the size and shape of your heart and how well your heart's chambers and valves are working. This procedure takes approximately one hour. There are no restrictions for this procedure.     Follow-Up: At [facility], you and your health needs are our priority.  As part of our continuing mission to provide you with exceptional heart care, we have created designated Provider Care Teams.  These Care Teams include your primary Cardiologist (physician) and Advanced Practice Providers (APPs -  Physician Assistants and Nurse Practitioners) who all work together to provide you with the care you need, when you need it.  We recommend signing up for the patient portal called "MyChart".  Sign up information is provided on this After Visit Summary.  MyChart is used to connect with patients for Virtual Visits (Telemedicine).  Patients are able to view lab/test results, encounter notes, upcoming appointments, etc.  Non-urgent messages can be sent to your provider as well.   To learn more about what you can do with MyChart, go to [email or web address].    Your next appointment:   12  month(s)  The format for your next appointment:   In Person  Provider:   Shirlee More, MD   Other Instructions None

## 2019-05-22 ENCOUNTER — Ambulatory Visit (HOSPITAL_BASED_OUTPATIENT_CLINIC_OR_DEPARTMENT_OTHER): Payer: Medicare PPO

## 2019-05-29 ENCOUNTER — Ambulatory Visit (INDEPENDENT_AMBULATORY_CARE_PROVIDER_SITE_OTHER): Payer: Medicare PPO | Admitting: Family Medicine

## 2019-05-29 MED FILL — FLUTICASONE PROP 50 MCG SPR: 50 | 90 days supply | Qty: 48 | Fill #1

## 2019-06-03 ENCOUNTER — Other Ambulatory Visit (HOSPITAL_BASED_OUTPATIENT_CLINIC_OR_DEPARTMENT_OTHER): Payer: Self-pay

## 2019-06-03 MED FILL — AMOXICILLIN 500 MG CAPSULE: 500 | 3 days supply | Qty: 12 | Fill #0

## 2019-06-04 ENCOUNTER — Other Ambulatory Visit (HOSPITAL_BASED_OUTPATIENT_CLINIC_OR_DEPARTMENT_OTHER): Payer: Self-pay | Admitting: Obstetrics and Gynecology

## 2019-06-04 MED FILL — DESMOPRESSIN ACETATE 0.2 MG: 0.2 | 90 days supply | Qty: 270 | Fill #0

## 2019-06-12 ENCOUNTER — Other Ambulatory Visit (INDEPENDENT_AMBULATORY_CARE_PROVIDER_SITE_OTHER): Payer: Self-pay | Admitting: Family Medicine

## 2019-06-12 ENCOUNTER — Ambulatory Visit (INDEPENDENT_AMBULATORY_CARE_PROVIDER_SITE_OTHER): Payer: Medicare PPO | Admitting: Family Medicine

## 2019-06-12 DIAGNOSIS — E559 Vitamin D deficiency, unspecified: Secondary | ICD-10-CM

## 2019-06-13 MED FILL — VIT D2 1.25 MG (50,000 UNIT: 1.25 MG | 28 days supply | Qty: 4 | Fill #0

## 2019-06-17 ENCOUNTER — Encounter: Payer: Self-pay | Admitting: Family Medicine

## 2019-06-17 ENCOUNTER — Other Ambulatory Visit (HOSPITAL_BASED_OUTPATIENT_CLINIC_OR_DEPARTMENT_OTHER): Payer: Self-pay | Admitting: Obstetrics and Gynecology

## 2019-06-17 DIAGNOSIS — N200 Calculus of kidney: Secondary | ICD-10-CM

## 2019-06-17 DIAGNOSIS — N3000 Acute cystitis without hematuria: Secondary | ICD-10-CM

## 2019-06-17 MED FILL — SOLIFENACIN SUCCINATE 5 MG: 5 | 90 days supply | Qty: 90 | Fill #0

## 2019-06-17 MED FILL — CEPHALEXIN 250 MG CAPSULE: 250 | 90 days supply | Qty: 90 | Fill #0

## 2019-06-18 DIAGNOSIS — E7800 Pure hypercholesterolemia, unspecified: Secondary | ICD-10-CM

## 2019-06-18 DIAGNOSIS — Z79899 Other long term (current) drug therapy: Secondary | ICD-10-CM

## 2019-06-19 DIAGNOSIS — E7800 Pure hypercholesterolemia, unspecified: Secondary | ICD-10-CM | POA: Diagnosis not present

## 2019-06-19 DIAGNOSIS — Z79899 Other long term (current) drug therapy: Secondary | ICD-10-CM | POA: Diagnosis not present

## 2019-06-19 MED FILL — MYRBETRIQ ER 50 MG TABLET: 50 | 90 days supply | Qty: 90 | Fill #0

## 2019-06-20 ENCOUNTER — Telehealth: Payer: Self-pay

## 2019-06-20 LAB — LIPID PANEL
Chol/HDL Ratio: 3.7 ratio (ref 0.0–4.4)
Cholesterol, Total: 193 mg/dL (ref 100–199)
HDL: 52 mg/dL (ref >39)
LDL Chol Calc (NIH): 103 mg/dL — ABNORMAL HIGH (ref 0–99)
Triglycerides: 220 mg/dL — ABNORMAL HIGH (ref 0–149)
VLDL Cholesterol Cal: 38 mg/dL (ref 5–40)

## 2019-06-20 NOTE — Telephone Encounter (Signed)
-----   Message from Richardo Priest, MD sent at 06/20/2019  7:43 AM EDT ----- Normal or stable result  She had muscle pain with a statin, at this time I would not restart.

## 2019-06-20 NOTE — Telephone Encounter (Signed)
Spoke with patient regarding results and recommendation.  Patient verbalizes understanding and is agreeable to plan of care. Advised patient to call back with any issues or concerns.  

## 2019-07-01 ENCOUNTER — Other Ambulatory Visit: Payer: Self-pay

## 2019-07-01 ENCOUNTER — Ambulatory Visit (INDEPENDENT_AMBULATORY_CARE_PROVIDER_SITE_OTHER): Payer: Medicare PPO | Admitting: Family Medicine

## 2019-07-01 ENCOUNTER — Encounter (INDEPENDENT_AMBULATORY_CARE_PROVIDER_SITE_OTHER): Payer: Self-pay | Admitting: Family Medicine

## 2019-07-01 VITALS — BP 148/75 | HR 77 | Temp 97.6°F | Ht 61.0 in | Wt 242.0 lb

## 2019-07-01 DIAGNOSIS — E559 Vitamin D deficiency, unspecified: Secondary | ICD-10-CM

## 2019-07-01 DIAGNOSIS — E7849 Other hyperlipidemia: Secondary | ICD-10-CM

## 2019-07-01 DIAGNOSIS — E119 Type 2 diabetes mellitus without complications: Secondary | ICD-10-CM

## 2019-07-01 DIAGNOSIS — Z6841 Body Mass Index (BMI) 40.0 and over, adult: Secondary | ICD-10-CM | POA: Diagnosis not present

## 2019-07-01 MED ORDER — VITAMIN D (ERGOCALCIFEROL) 1.25 MG (50000 UNIT) PO CAPS: 50000.0000 [IU] | ORAL_CAPSULE | ORAL | 0 refills | Status: DC

## 2019-07-01 NOTE — Progress Notes (Signed)
Chief Complaint:   OBESITY Jasmin [name] is here to discuss her progress with her obesity treatment plan along with follow-up of her obesity related diagnoses. Jasmin [name] is on the Category 2 Plan or keeping a food journal and adhering to recommended goals of 1000-1300 calories and 80+ grams of protein daily and states she is following her eating plan approximately 50% of the time. Jasmin [name] states she is walking, lifting weights, and steps for 25 minutes 7 times per week.  Today's visit was #: 1 Starting weight: 248 lbs Starting date: 06/21/2017 Today's weight: 242 lbs Today's date: 07/01/2019 Total lbs lost to date: 6 Total lbs lost since last in-office visit: 0  Interim History: Jasmin [name] has been snacking on apples, carrots, and avoiding higher calorie and carbohydrates snack options. Since her Cardiologist discontinued her statin, she has been able to steadily increase regular exercises and she states she feels just great.  Subjective:   1. Other hyperlipidemia Jasmin [name]'s lipid panel on 06/19/2019, showed a total cholesterol of 193, HDL 52, triglycerides 220, and LDL 103. Her Cardiologist stopped her atorvastatin 20 mg q daily due to myalgias on June 21, 2019.  2. Vitamin D deficiency Jasmin [name]'s Vit D level on 03/06/2019 was 31.2. She takes OTC Vit D 2,000 IU daily, then will take once weekly prescription strength will hold OTC on her prescription strength day.  3. Type 2 diabetes mellitus without complication, without long-term current use of insulin (HCC) Jasmin [name]'s home BGs range between 99 and 116. She denies episodes of hypoglycemia. She is taking metformin 500 mg BID, and she denies medication side effects.  Assessment/Plan:   1. Other hyperlipidemia Cardiovascular risk and specific lipid/LDL goals reviewed. We discussed several lifestyle modifications today. Jasmin [name] will continue her Category 2 meal plan, and will continue to work on regular exercise and weight loss efforts. Orders and  follow up as documented in patient record.   - Comprehensive metabolic panel  2. Vitamin D deficiency Low Vitamin D level contributes to fatigue and are associated with obesity, breast, and colon cancer. We will check labs today, and will refill prescription Vit D for 1 month. She will follow-up for routine testing of Vitamin D, at least 2-3 times per year to avoid over-replacement.  - Vitamin D, Ergocalciferol, (DRISDOL) 1.25 MG (50000 UNIT) CAPS capsule; Take 1 capsule (50,000 Units total) by mouth every 7 (seven) days.  Dispense: 4 capsule; Refill: 0  - VITAMIN D 25 Hydroxy (Vit-D Deficiency, Fractures)  3. Type 2 diabetes mellitus without complication, without long-term current use of insulin (HCC) Good blood sugar control is important to decrease the likelihood of diabetic complications such as nephropathy, neuropathy, limb loss, blindness, coronary artery disease, and death. Intensive lifestyle modification including diet, exercise and weight loss are the first line of treatment for diabetes. Jasmin [name] will continue to check her BGs at home, and will continue her Category 2 meal plan. We will check labs today.  - CBC with Differential/Platelet - Hemoglobin A1c - Insulin, random - T3 - T4, free - TSH  4. Class 3 severe obesity with serious comorbidity and body mass index (BMI) of 45.0 to 49.9 in adult, unspecified obesity type (HCC) Jasmin [name] is currently in the action stage of change. As such, her goal is to continue with weight loss efforts. She has agreed to the Category 2 Plan.   Exercise goals: As is.  Behavioral modification strategies: increasing lean protein intake, decreasing simple carbohydrates and celebration eating strategies.  Jasmin [name] has agreed to follow-up with  our clinic in 2 to 3 weeks. She was informed of the importance of frequent follow-up visits to maximize her success with intensive lifestyle modifications for her multiple health conditions.   Jasmin [name] was  informed we would discuss her lab results at her next visit unless there is a critical issue that needs to be addressed sooner. Jasmin [name] agreed to keep her next visit at the agreed upon time to discuss these results.  Objective:   Blood pressure (!) 148/75, pulse 77, temperature 97.6 F (36.4 C), temperature source Oral, height 5\' 1"  (1.549 m), weight 242 lb (109.8 kg), SpO2 100 %. Body mass index is 45.73 kg/m.  General: Cooperative, alert, well developed, in no acute distress. HEENT: Conjunctivae and lids unremarkable. Cardiovascular: Regular rhythm.  Lungs: Normal work of breathing. Neurologic: No focal deficits.   Lab Results  Component Value Date   CREATININE 0.56 12/31/2018   BUN 14 12/31/2018   NA 136 12/31/2018   K 4.6 12/31/2018   CL 100 12/31/2018   CO2 28 12/31/2018   Lab Results  Component Value Date   ALT 13 12/31/2018   AST 18 12/31/2018   ALKPHOS 60 12/31/2018   BILITOT 1.0 12/31/2018   Lab Results  Component Value Date   HGBA1C 6.6 (H) 12/31/2018   HGBA1C 6.3 08/29/2018   HGBA1C 6.7 (H) 01/31/2018   HGBA1C 6.4 (H) 09/28/2017   HGBA1C 7.8 (H) 05/24/2017   Lab Results  Component Value Date   INSULIN 24.2 09/28/2017   INSULIN 17.6 06/21/2017   Lab Results  Component Value Date   TSH 1.610 06/21/2017   Lab Results  Component Value Date   CHOL 193 06/19/2019   HDL 52 06/19/2019   LDLCALC 103 (H) 06/19/2019   LDLDIRECT 66.0 12/14/2016   TRIG 220 (H) 06/19/2019   CHOLHDL 3.7 06/19/2019   Lab Results  Component Value Date   WBC 6.4 12/31/2018   HGB 13.7 12/31/2018   HCT 41.2 12/31/2018   MCV 91.8 12/31/2018   PLT 259.0 12/31/2018   No results found for: IRON, TIBC, FERRITIN  Obesity Behavioral Intervention Documentation for Insurance:   Approximately 15 minutes were spent on the discussion below.  ASK: We discussed the diagnosis of obesity with Jasmin [name] today and Jasmin [name] agreed to give [name] permission to discuss obesity behavioral  modification therapy today.  ASSESS: Jasmin [name] has the diagnosis of obesity and her BMI today is 45.75. Jasmin [name] is in the action stage of change.   ADVISE: Jasmin [name] was educated on the multiple health risks of obesity as well as the benefit of weight loss to improve her health. She was advised of the need for long term treatment and the importance of lifestyle modifications to improve her current health and to decrease her risk of future health problems.  AGREE: Multiple dietary modification options and treatment options were discussed and Khristin agreed to follow the recommendations documented in the above note.  ARRANGE: Kynadie was educated on the importance of frequent visits to treat obesity as outlined per CMS and USPSTF guidelines and agreed to schedule her next follow up appointment today.  Attestation Statements:   Reviewed by clinician on day of visit: allergies, medications, problem list, medical history, surgical history, family history, social history, and previous encounter notes.   I, Trixie Dredge, am acting as transcriptionist for Dennard Nip, MD.  I have reviewed the above documentation for accuracy and completeness, and I agree with the above. -  Dennard Nip, MD

## 2019-07-02 LAB — CBC WITH DIFFERENTIAL/PLATELET
Basophils Absolute: 0.1 10*3/uL (ref 0.0–0.2)
Basos: 1 %
EOS (ABSOLUTE): 0.1 10*3/uL (ref 0.0–0.4)
Eos: 2 %
Hematocrit: 37.7 % (ref 34.0–46.6)
Hemoglobin: 13 g/dL (ref 11.1–15.9)
Immature Grans (Abs): 0 10*3/uL (ref 0.0–0.1)
Immature Granulocytes: 1 %
Lymphocytes Absolute: 1.8 10*3/uL (ref 0.7–3.1)
Lymphs: 29 %
MCH: 30.7 pg (ref 26.6–33.0)
MCHC: 34.5 g/dL (ref 31.5–35.7)
MCV: 89 fL (ref 79–97)
Monocytes Absolute: 0.4 10*3/uL (ref 0.1–0.9)
Monocytes: 7 %
Neutrophils Absolute: 3.6 10*3/uL (ref 1.4–7.0)
Neutrophils: 60 %
Platelets: 288 10*3/uL (ref 150–450)
RBC: 4.23 x10E6/uL (ref 3.77–5.28)
RDW: 14 % (ref 11.7–15.4)
WBC: 6 10*3/uL (ref 3.4–10.8)

## 2019-07-02 LAB — COMPREHENSIVE METABOLIC PANEL
ALT: 12 IU/L (ref 0–32)
AST: 20 IU/L (ref 0–40)
Albumin/Globulin Ratio: 2.3 — ABNORMAL HIGH (ref 1.2–2.2)
Albumin: 4.6 g/dL (ref 3.7–4.7)
Alkaline Phosphatase: 51 IU/L (ref 39–117)
BUN/Creatinine Ratio: 21 (ref 12–28)
BUN: 12 mg/dL (ref 8–27)
Bilirubin Total: 0.6 mg/dL (ref 0.0–1.2)
CO2: 23 mmol/L (ref 20–29)
Calcium: 9.3 mg/dL (ref 8.7–10.3)
Chloride: 95 mmol/L — ABNORMAL LOW (ref 96–106)
Creatinine, Ser: 0.56 mg/dL — ABNORMAL LOW (ref 0.57–1.00)
GFR calc Af Amer: 106 mL/min/{1.73_m2} (ref >59)
GFR calc non Af Amer: 92 mL/min/{1.73_m2} (ref >59)
Globulin, Total: 2 g/dL (ref 1.5–4.5)
Glucose: 104 mg/dL — ABNORMAL HIGH (ref 65–99)
Potassium: 4.5 mmol/L (ref 3.5–5.2)
Sodium: 132 mmol/L — ABNORMAL LOW (ref 134–144)
Total Protein: 6.6 g/dL (ref 6.0–8.5)

## 2019-07-02 LAB — HEMOGLOBIN A1C
Est. average glucose Bld gHb Est-mCnc: 128 mg/dL
Hgb A1c MFr Bld: 6.1 % — ABNORMAL HIGH (ref 4.8–5.6)

## 2019-07-02 LAB — T3: T3, Total: 99 ng/dL (ref 71–180)

## 2019-07-02 LAB — TSH: TSH: 1.86 u[IU]/mL (ref 0.450–4.500)

## 2019-07-02 LAB — INSULIN, RANDOM: INSULIN: 21.1 u[IU]/mL (ref 2.6–24.9)

## 2019-07-02 LAB — T4, FREE: Free T4: 1.33 ng/dL (ref 0.82–1.77)

## 2019-07-02 LAB — VITAMIN D 25 HYDROXY (VIT D DEFICIENCY, FRACTURES): Vit D, 25-Hydroxy: 39.9 ng/mL (ref 30.0–100.0)

## 2019-07-11 MED FILL — LISINOPRIL 10 MG TABS: 10 | 90 days supply | Qty: 90 | Fill #2

## 2019-07-16 DIAGNOSIS — R350 Frequency of micturition: Secondary | ICD-10-CM | POA: Diagnosis not present

## 2019-07-16 DIAGNOSIS — N3281 Overactive bladder: Secondary | ICD-10-CM | POA: Diagnosis not present

## 2019-07-16 DIAGNOSIS — N952 Postmenopausal atrophic vaginitis: Secondary | ICD-10-CM | POA: Diagnosis not present

## 2019-07-16 DIAGNOSIS — N812 Incomplete uterovaginal prolapse: Secondary | ICD-10-CM | POA: Diagnosis not present

## 2019-07-16 DIAGNOSIS — Z6841 Body Mass Index (BMI) 40.0 and over, adult: Secondary | ICD-10-CM | POA: Diagnosis not present

## 2019-07-16 DIAGNOSIS — N8111 Cystocele, midline: Secondary | ICD-10-CM | POA: Diagnosis not present

## 2019-07-16 DIAGNOSIS — R351 Nocturia: Secondary | ICD-10-CM | POA: Diagnosis not present

## 2019-07-16 DIAGNOSIS — N390 Urinary tract infection, site not specified: Secondary | ICD-10-CM | POA: Diagnosis not present

## 2019-07-17 ENCOUNTER — Other Ambulatory Visit (HOSPITAL_BASED_OUTPATIENT_CLINIC_OR_DEPARTMENT_OTHER): Payer: Self-pay | Admitting: Family Medicine

## 2019-07-17 DIAGNOSIS — R05 Cough: Secondary | ICD-10-CM | POA: Diagnosis not present

## 2019-07-17 MED FILL — OMEPRAZOLE 40 MG CPDR: 40 | 30 days supply | Qty: 30 | Fill #0

## 2019-07-19 MED FILL — SULFAMETHOXAZOLE-TMP DS TAB: 800-160 | 5 days supply | Qty: 10 | Fill #0

## 2019-07-24 ENCOUNTER — Ambulatory Visit (INDEPENDENT_AMBULATORY_CARE_PROVIDER_SITE_OTHER): Payer: Medicare PPO | Admitting: Family Medicine

## 2019-07-29 ENCOUNTER — Ambulatory Visit (HOSPITAL_BASED_OUTPATIENT_CLINIC_OR_DEPARTMENT_OTHER)
Admission: RE | Admit: 2019-07-29 | Discharge: 2019-07-29 | Disposition: A | Discharge status: Home / Self Care | Payer: Medicare PPO | Source: Ambulatory Visit | Attending: Cardiology | Admitting: Cardiology

## 2019-07-29 ENCOUNTER — Other Ambulatory Visit: Payer: Self-pay

## 2019-07-29 DIAGNOSIS — I350 Nonrheumatic aortic (valve) stenosis: Secondary | ICD-10-CM | POA: Diagnosis not present

## 2019-07-29 NOTE — Progress Notes (Signed)
  Echocardiogram 2D Echocardiogram has been performed.  Jasmin [name] F 07/29/2019, 9:28 AM

## 2019-07-30 ENCOUNTER — Telehealth: Payer: Self-pay

## 2019-07-30 NOTE — Telephone Encounter (Signed)
-----   Message from Richardo Priest, MD sent at 07/30/2019  7:39 AM EDT ----- Normal or stable result  No change recheck in 1 year

## 2019-07-30 NOTE — Telephone Encounter (Signed)
Spoke with patient regarding results and recommendation.  Patient verbalizes understanding and is agreeable to plan of care. Advised patient to call back with any issues or concerns.  

## 2019-08-08 ENCOUNTER — Encounter: Payer: Self-pay | Admitting: Family Medicine

## 2019-08-08 ENCOUNTER — Other Ambulatory Visit: Payer: Self-pay

## 2019-08-08 ENCOUNTER — Ambulatory Visit (INDEPENDENT_AMBULATORY_CARE_PROVIDER_SITE_OTHER): Payer: Medicare PPO | Admitting: Family Medicine

## 2019-08-08 ENCOUNTER — Other Ambulatory Visit: Payer: Self-pay | Admitting: Family Medicine

## 2019-08-08 VITALS — BP 140/72 | HR 81 | Temp 97.3°F | Resp 18 | Ht 61.0 in | Wt 251.0 lb

## 2019-08-08 DIAGNOSIS — R300 Dysuria: Secondary | ICD-10-CM

## 2019-08-08 DIAGNOSIS — I10 Essential (primary) hypertension: Secondary | ICD-10-CM | POA: Diagnosis not present

## 2019-08-08 DIAGNOSIS — E7849 Other hyperlipidemia: Secondary | ICD-10-CM

## 2019-08-08 DIAGNOSIS — E119 Type 2 diabetes mellitus without complications: Secondary | ICD-10-CM

## 2019-08-08 DIAGNOSIS — W19XXXD Unspecified fall, subsequent encounter: Secondary | ICD-10-CM

## 2019-08-08 LAB — POC URINALSYSI DIPSTICK (AUTOMATED)
Blood, UA: NEGATIVE
Glucose, UA: NEGATIVE
Ketones, UA: NEGATIVE
Nitrite, UA: POSITIVE
Protein, UA: NEGATIVE
Spec Grav, UA: 1.015 (ref 1.010–1.025)
Urobilinogen, UA: 1 E.U./dL
pH, UA: 7 (ref 5.0–8.0)

## 2019-08-08 MED ORDER — LOSARTAN POTASSIUM 25 MG PO TABS: 25.0000 mg | ORAL_TABLET | Freq: Every day | ORAL | 3 refills | Status: DC

## 2019-08-08 MED ORDER — NITROFURANTOIN MONOHYD MACRO 100 MG PO CAPS: 100.0000 mg | ORAL_CAPSULE | Freq: Two times a day (BID) | ORAL | 0 refills | Status: DC

## 2019-08-08 MED FILL — LOSARTAN POTASSIUM 25 MG TA: 25 | 90 days supply | Qty: 90 | Fill #0

## 2019-08-08 MED FILL — NITROFURANTOIN MONO-MCR 100: 100 | 7 days supply | Qty: 14 | Fill #0

## 2019-08-08 NOTE — Progress Notes (Signed)
[facility] [place], [place], [place] [address] [phone]  Date:  08/08/2019   Name:  Jasmin [name]   DOB:  Jun 29, 1945   MRN:  [id number]  PCP:  Darreld Mclean, MD    Chief Complaint: Dysuria (hx of uti, frequency, burning, taking azo) and Medication Management   History of Present Illness:  Jasmin [name] is a 74 y.o. very pleasant female patient who presents with the following:  Patient today to follow-up- history of well-controlled diabetes, hypertension, hyperlipidemia, stroke, OSA, obesity Last seen by myself in November 2020; at that time she had tripped and fallen and hurt her ribs, we did review films which did not reveal any fracture No further falls   She recently consulted with a new urologist, Dr. [place] Pink with [facility] She suffers from overactive bladder She continues to have some issues with UTI, etc She notes possible sx of a UTI yesterday with burning and frequency   She is taking vesicare and myrbetriq during the day and DDAVP at bedtime which has helped with her sleep a lot.   She also sees ENT, Dr. Hassell Done for chronic cough They have tried treating her for GERD and also wonder if her lisinopril could be to blame    [facility], most recent visit last month They did blood work at that time, her A1c is 6.1%, thyroid levels normal  Foot exam is due Eye exam- planned for next month, she is checked biannually  Immunizations are up-to-date  She paused her statin and it relieved her joint pains.  Dr Bettina Gavia took her off her statin and her lipids really did not go up  She had a CVA in 2017.  Over the last month or so she has noted that her balance seems not as good as prior She did have this fall last year.  She might like to do some PT to work on her balance and confidence in walking  Lab Results  Component Value Date    HGBA1C 6.1 (H) 07/01/2019    Patient Active Problem List   Diagnosis Date Noted  . Cough 03/17/2017  . Arthritis 12/16/2016  . UTI (urinary tract infection)   . Stroke ([place])   . Sleep apnea   . Right kidney stone   . Hypertension   . Hyperlipidemia   . Heart murmur   . Diabetes mellitus without complication ([place])   . 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 Mixed hyperlipidemia 08/08/2016  . Controlled type 2 diabetes mellitus without complication, without long-term current use of insulin ([place]) 08/08/2016  . Essential hypertension 08/08/2016  . History of CVA (cerebrovascular accident) 08/08/2016  . Nuclear sclerotic cataract of left eye 07/07/2016  . OAB (overactive bladder) 01/20/2016  . Morbid obesity ([place]) 11/10/2015  . IBS (irritable bowel syndrome) 07/16/2015  . Insomnia 07/16/2015  . Left ventricular hypertrophy 07/16/2015  . Pulmonary hypertension ([place]) 07/16/2015  . 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 Acid reflux disease 03/09/2015  . Aortic stenosis 03/09/2015  . AR (allergic rhinitis) 03/09/2015  . 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 OSA (obstructive sleep apnea) 03/09/2015  . Osteoporosis 03/09/2015  . Vitamin D deficiency 03/09/2015    Past Medical History:  Diagnosis Date  . Acid reflux disease 03/09/2015  . Aortic stenosis 03/09/2015  . AR (allergic rhinitis) 03/09/2015  .  Arthritis   . Back pain   . Controlled type 2 diabetes mellitus without complication, without long-term current use of insulin ([place]) 08/08/2016  . Cough   . 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 Decreased hearing   . Diabetes mellitus without complication ([place])   . Dry mouth   . Easy bruising   . Essential hypertension 08/08/2016  . Heart murmur   . 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 History of CVA (cerebrovascular accident) 08/08/2016   Seen on MRI from 08/2015  . Hyperlipidemia   . Hypertension   .  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 IBS (irritable bowel syndrome) 07/16/2015  . Insomnia 07/16/2015  . Joint pain   . Knee pain   . Left ventricular hypertrophy 07/16/2015  . Leg cramping    right leg  . Mixed hyperlipidemia 08/08/2016  . Morbid obesity ([place]) 11/10/2015  . Nasal discharge   . Nuclear sclerotic cataract of left eye 07/07/2016  . OAB (overactive bladder) 01/20/2016  . OSA (obstructive sleep apnea) 03/09/2015  . Osteoporosis 03/09/2015  . 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 Pulmonary hypertension ([place]) 07/16/2015  . Red eyes   . Right kidney stone   . Shortness of breath on exertion   . Sleep apnea   . Stroke ([place])   . Swelling of both lower extremities   . UTI (urinary tract infection)   . Vitamin D deficiency 03/09/2015    Past Surgical History:  Procedure Laterality Date  . ABDOMINAL HYSTERECTOMY  09/14/2016  . CATARACT EXTRACTION Left   . CATARACT EXTRACTION Left 06/2016  . COLPORRHAPHY N/A 09/14/2016   [facility]  . CYSTOCELE REPAIR    . FRACTURE SURGERY Left 1953   L arm  . FRACTURE SURGERY Left 1985   L ankle  . HERNIA REPAIR  2004  . JOINT REPLACEMENT Right 2012   R TKA  . JOINT REPLACEMENT Left 2017  . KIDNEY STONE SURGERY    . LAPAROSCOPIC ASSISTED VAGINAL HYSTERECTOMY N/A 09/14/2016   [facility]  . R foot/ankle Right 2013 and 2014   plate and screws lateral foot and tendon removal medial foot in 2013, revision in 2014  . RECTOCELE REPAIR    . URETERAL STENT PLACEMENT    . URETEROSCOPY      Social History   Tobacco Use  . Smoking status: Never Smoker  . Smokeless tobacco: Never Used  Vaping Use  . Vaping Use: Never used  Substance Use Topics  . Alcohol use: Yes    Alcohol/week: 2.0 - 3.0 standard drinks    Types: 2 - 3 Glasses of wine per week    Comment: couple of glasses per week  . Drug use: No    Family History  Problem Relation Age of Onset  . Hyperlipidemia Mother   . Heart disease Mother   . Hypertension Mother   . Stroke  Mother   . Thyroid disease Mother   . Hypertension Father   . Heart disease Father     Allergies  Allergen Reactions  . Latex Itching  . Ciprofloxacin Hives  . Metronidazole Hives  . Adhesive [Tape] Rash    Medication list has been reviewed and updated.  Current Outpatient Medications on File Prior to Visit  Medication Sig Dispense Refill  . amoxicillin (AMOXIL) 500 MG capsule Take prior to dentist procedure    . buPROPion (WELLBUTRIN SR) 150 MG 12 hr tablet Take 1 tablet (150 mg total) by mouth daily. 30 tablet 0  .  CALCIUM PO Take 1 tablet by mouth daily.    . cephALEXin (KEFLEX) 250 MG capsule Take by mouth at bedtime.    . Cholecalciferol (VITAMIN D3) 2000 units TABS Take by mouth. Take one tablet in the AM    . clopidogrel (PLAVIX) 75 MG tablet TAKE ONE (1) TABLET BY MOUTH EVERY DAY 90 tablet 3  . desmopressin (DDAVP) 0.2 MG tablet Start 1 tablet nightly for 7 days and can increase to a maximum of 3 tablets over 3 weeks to decrease nocturia    . desonide (DESOWEN) 0.05 % cream Apply topically as needed.     [name] econazole nitrate 1 % cream Apply topically as needed.     [name] estradiol (ESTRACE) 0.1 MG/GM vaginal cream Place 1 Applicatorful vaginally at bedtime.    . fluticasone (FLONASE) 50 MCG/ACT nasal spray PLACE 2 SPRAYS INTO BOTH NOSTRILS DAILY. 48 g 3  . lisinopril (ZESTRIL) 10 MG tablet TAKE ONE (1) TABLET BY MOUTH EVERY DAY 90 tablet 3  . metFORMIN (GLUCOPHAGE) 500 MG tablet Take 1 tablet (500 mg total) by mouth 2 (two) times daily with a meal. 180 tablet 3  . metroNIDAZOLE (METROCREAM) 0.75 % cream Apply topically 2 (two) times daily.    . mirabegron ER (MYRBETRIQ) 50 MG TB24 tablet Take 50 mg by mouth daily.     . naproxen sodium (ALEVE) 220 MG tablet Take 220 mg by mouth as needed.    [name] omega-3 acid ethyl esters (LOVAZA) 1 g capsule Take 2 capsules (2 g total) by mouth 2 (two) times daily. 360 capsule 3  . omeprazole (PRILOSEC) 40 MG capsule     . oxyCODONE-acetaminophen  (PERCOCET) 5-325 MG tablet Take 1 tablet by mouth every 8 (eight) hours as needed for severe pain. 15 tablet 0  . Probiotic Product (PROBIOTIC-10 PO) Take 1 capsule by mouth daily.    . PSYLLIUM PO Take by mouth.    . solifenacin (VESICARE) 5 MG tablet Take 10 mg by mouth daily.     . Vitamin D, Ergocalciferol, (DRISDOL) 1.25 MG (50000 UNIT) CAPS capsule Take 1 capsule (50,000 Units total) by mouth every 7 (seven) days. 4 capsule 0   No current facility-administered medications on file prior to visit.    Review of Systems:  As per HPI- otherwise negative.   Physical Examination: Vitals:   08/08/19 1104  BP: 140/72  Pulse: 81  Resp: 18  Temp: (!) 97.3 F (36.3 C)  SpO2: 97%   Vitals:   08/08/19 1104  Weight: 251 lb (113.9 kg)  Height: 5\' 1"  (1.549 m)   Body mass index is 47.43 kg/m. Ideal Body Weight: Weight in (lb) to have BMI = 25: 132  GEN: no acute distress. Obese, looks well  HEENT: Atraumatic, Normocephalic.   Bilateral TM wnl, oropharynx normal.  PEERL,EOMI.    Ears and Nose: No external deformity. CV: RRR, No M/G/R. No JVD. No thrill. No extra heart sounds. PULM: CTA B, no wheezes, crackles, rhonchi. No retractions. No resp. distress. No accessory muscle use. ABD: S, NT, ND, +BS. No rebound. No HSM. EXTR: No c/c/e PSYCH: Normally interactive. Conversant.  Foot exam normal  Results for orders placed or performed in visit on 08/08/19  POCT Urinalysis Dipstick (Automated)  Result Value Ref Range   Color, UA orange    Clarity, UA cloudy    Glucose, UA Negative Negative   Bilirubin, UA 1+    Ketones, UA negative    Spec Grav, UA 1.015 1.010 -  1.025   Blood, UA negative    pH, UA 7.0 5.0 - 8.0   Protein, UA Negative Negative   Urobilinogen, UA 1.0 0.2 or 1.0 E.U./dL   Nitrite, UA positive    Leukocytes, UA Trace (A) Negative     Assessment and Plan: Other hyperlipidemia  Type 2 diabetes mellitus without complication, without long-term current use of  insulin (HCC)  Essential hypertension - Plan: losartan (COZAAR) 25 MG tablet  Dysuria - Plan: POCT Urinalysis Dipstick (Automated), Urine Culture, nitrofurantoin, macrocrystal-monohydrate, (MACROBID) 100 MG capsule  Fall, subsequent encounter - Plan: Ambulatory referral to Physical Therapy  Patient today for a follow-up visit.  Her A1c was recently checked by the [facility] and showed good control diabetes She has had of more cough recently, we have tried treatment for GERD.  This is perhaps helped some, but her cough is still persistent.  Would like to try stopping ACE inhibitor in case this is to blame.  We will substitute losartan for lisinopril She has trouble with frequent UTI as well as urinary incontinence.  She is seen urology for this issue.  Her current UA is suggestive of a UTI.  We will start her on Macrobid while urine culture is pending Recent labs showed GFR over 90 She is interested in doing some physical therapy to work on her balance and confidence in walking since her fall last year.  Have made this referral for her This visit occurred during the SARS-CoV-2 public health emergency.  Safety protocols were in place, including screening questions prior to the visit, additional usage of staff PPE, and extensive cleaning of exam room while observing appropriate contact time as indicated for disinfecting solutions.    Signed Lamar Blinks, MD

## 2019-08-08 NOTE — Patient Instructions (Addendum)
It was great to see you again today I will be in touch with your urine culture asap For the time being we will treat you with macrobid twice a day for one week Stop lisinopril and change to losartan 25 mg-this may help decrease your cough symptoms  We will refer you to PT to work on your balance and walking confidence  Please see me in 6 months

## 2019-08-09 LAB — URINE CULTURE
MICRO NUMBER:: 10603112
SPECIMEN QUALITY:: ADEQUATE

## 2019-08-10 ENCOUNTER — Encounter: Payer: Self-pay | Admitting: Family Medicine

## 2019-08-12 ENCOUNTER — Other Ambulatory Visit: Payer: Self-pay | Admitting: Family Medicine

## 2019-08-12 ENCOUNTER — Other Ambulatory Visit (INDEPENDENT_AMBULATORY_CARE_PROVIDER_SITE_OTHER): Payer: Self-pay | Admitting: Family Medicine

## 2019-08-12 DIAGNOSIS — E782 Mixed hyperlipidemia: Secondary | ICD-10-CM

## 2019-08-12 DIAGNOSIS — Z8673 Personal history of transient ischemic attack (TIA), and cerebral infarction without residual deficits: Secondary | ICD-10-CM

## 2019-08-12 DIAGNOSIS — E559 Vitamin D deficiency, unspecified: Secondary | ICD-10-CM

## 2019-08-12 MED FILL — VIT D2 1.25 MG (50,000 UNIT: 1.25 MG | 28 days supply | Qty: 4 | Fill #0

## 2019-08-12 MED FILL — METFORMIN HCL 500 MG TABS: 500 | 90 days supply | Qty: 180 | Fill #2

## 2019-08-13 ENCOUNTER — Other Ambulatory Visit: Payer: Self-pay | Admitting: Family Medicine

## 2019-08-13 MED FILL — CLOPIDOGREL 75 MG TABLET: 75 | 90 days supply | Qty: 90 | Fill #0

## 2019-08-13 MED FILL — OMEGA-3-ACID ETHYL ESTERS 1: 1 | 90 days supply | Qty: 360 | Fill #0

## 2019-08-14 ENCOUNTER — Ambulatory Visit (INDEPENDENT_AMBULATORY_CARE_PROVIDER_SITE_OTHER): Payer: Medicare PPO | Admitting: Family Medicine

## 2019-08-14 ENCOUNTER — Telehealth: Payer: Self-pay | Admitting: Family Medicine

## 2019-08-14 ENCOUNTER — Other Ambulatory Visit: Payer: Self-pay | Admitting: Family Medicine

## 2019-08-14 ENCOUNTER — Encounter: Payer: Self-pay | Admitting: Family Medicine

## 2019-08-14 DIAGNOSIS — W19XXXD Unspecified fall, subsequent encounter: Secondary | ICD-10-CM

## 2019-08-14 MED FILL — SOLIFENACIN SUCCINATE 5 MG: 5 | 45 days supply | Qty: 90 | Fill #0

## 2019-08-14 NOTE — Telephone Encounter (Signed)
Caller:Tiffani Call back phone number: [phone]  Patient states needs a referral to be fax to [facility]- [facility] [place], [place]  Fax number [phone] Att: Kennyth Lose Add on your note patient request to see Azell Der

## 2019-08-15 ENCOUNTER — Ambulatory Visit: Payer: Medicare PPO | Admitting: *Deleted

## 2019-08-15 NOTE — Telephone Encounter (Signed)
Faxed via [facility] to below

## 2019-08-16 MED FILL — OMEPRAZOLE 40 MG CPDR: 40 | 30 days supply | Qty: 30 | Fill #1

## 2019-08-19 ENCOUNTER — Ambulatory Visit: Payer: Medicare PPO | Admitting: Physical Therapy

## 2019-08-22 DIAGNOSIS — Z9181 History of falling: Secondary | ICD-10-CM | POA: Diagnosis not present

## 2019-08-28 DIAGNOSIS — Z9181 History of falling: Secondary | ICD-10-CM | POA: Diagnosis not present

## 2019-08-29 MED FILL — DESMOPRESSIN ACETATE 0.2 MG: 0.2 | 90 days supply | Qty: 270 | Fill #1

## 2019-08-30 DIAGNOSIS — H52203 Unspecified astigmatism, bilateral: Secondary | ICD-10-CM | POA: Diagnosis not present

## 2019-08-30 DIAGNOSIS — H524 Presbyopia: Secondary | ICD-10-CM | POA: Diagnosis not present

## 2019-08-30 DIAGNOSIS — Z961 Presence of intraocular lens: Secondary | ICD-10-CM | POA: Diagnosis not present

## 2019-08-30 DIAGNOSIS — H16223 Keratoconjunctivitis sicca, not specified as Sjogren's, bilateral: Secondary | ICD-10-CM | POA: Diagnosis not present

## 2019-08-30 DIAGNOSIS — H26492 Other secondary cataract, left eye: Secondary | ICD-10-CM | POA: Diagnosis not present

## 2019-08-30 DIAGNOSIS — H2511 Age-related nuclear cataract, right eye: Secondary | ICD-10-CM | POA: Diagnosis not present

## 2019-08-30 DIAGNOSIS — H5203 Hypermetropia, bilateral: Secondary | ICD-10-CM | POA: Diagnosis not present

## 2019-09-02 DIAGNOSIS — Z9181 History of falling: Secondary | ICD-10-CM | POA: Diagnosis not present

## 2019-09-04 ENCOUNTER — Ambulatory Visit (INDEPENDENT_AMBULATORY_CARE_PROVIDER_SITE_OTHER): Payer: Medicare PPO | Admitting: Family Medicine

## 2019-09-05 DIAGNOSIS — Z9181 History of falling: Secondary | ICD-10-CM | POA: Diagnosis not present

## 2019-09-06 ENCOUNTER — Other Ambulatory Visit (INDEPENDENT_AMBULATORY_CARE_PROVIDER_SITE_OTHER): Payer: Self-pay | Admitting: Family Medicine

## 2019-09-06 DIAGNOSIS — E559 Vitamin D deficiency, unspecified: Secondary | ICD-10-CM

## 2019-09-09 DIAGNOSIS — Z9181 History of falling: Secondary | ICD-10-CM | POA: Diagnosis not present

## 2019-09-10 MED FILL — MYRBETRIQ ER 50 MG TABLET: 50 | 90 days supply | Qty: 90 | Fill #1

## 2019-09-10 MED FILL — CEPHALEXIN 250 MG CAPSULE: 250 | 90 days supply | Qty: 90 | Fill #1

## 2019-09-11 DIAGNOSIS — I272 Pulmonary hypertension, unspecified: Secondary | ICD-10-CM | POA: Diagnosis not present

## 2019-09-11 DIAGNOSIS — R05 Cough: Secondary | ICD-10-CM | POA: Diagnosis not present

## 2019-09-11 DIAGNOSIS — G4733 Obstructive sleep apnea (adult) (pediatric): Secondary | ICD-10-CM | POA: Diagnosis not present

## 2019-09-11 DIAGNOSIS — I517 Cardiomegaly: Secondary | ICD-10-CM | POA: Diagnosis not present

## 2019-09-12 DIAGNOSIS — Z9181 History of falling: Secondary | ICD-10-CM | POA: Diagnosis not present

## 2019-09-16 DIAGNOSIS — G4733 Obstructive sleep apnea (adult) (pediatric): Secondary | ICD-10-CM | POA: Diagnosis not present

## 2019-09-23 DIAGNOSIS — Z9181 History of falling: Secondary | ICD-10-CM | POA: Diagnosis not present

## 2019-09-23 DIAGNOSIS — I69398 Other sequelae of cerebral infarction: Secondary | ICD-10-CM | POA: Diagnosis not present

## 2019-09-23 DIAGNOSIS — M6281 Muscle weakness (generalized): Secondary | ICD-10-CM | POA: Diagnosis not present

## 2019-09-23 DIAGNOSIS — R2689 Other abnormalities of gait and mobility: Secondary | ICD-10-CM | POA: Diagnosis not present

## 2019-09-26 DIAGNOSIS — R2689 Other abnormalities of gait and mobility: Secondary | ICD-10-CM | POA: Diagnosis not present

## 2019-09-26 DIAGNOSIS — M6281 Muscle weakness (generalized): Secondary | ICD-10-CM | POA: Diagnosis not present

## 2019-09-26 DIAGNOSIS — Z9181 History of falling: Secondary | ICD-10-CM | POA: Diagnosis not present

## 2019-09-26 DIAGNOSIS — I69398 Other sequelae of cerebral infarction: Secondary | ICD-10-CM | POA: Diagnosis not present

## 2019-09-30 DIAGNOSIS — R2689 Other abnormalities of gait and mobility: Secondary | ICD-10-CM | POA: Diagnosis not present

## 2019-09-30 DIAGNOSIS — I69398 Other sequelae of cerebral infarction: Secondary | ICD-10-CM | POA: Diagnosis not present

## 2019-09-30 DIAGNOSIS — Z9181 History of falling: Secondary | ICD-10-CM | POA: Diagnosis not present

## 2019-09-30 DIAGNOSIS — M6281 Muscle weakness (generalized): Secondary | ICD-10-CM | POA: Diagnosis not present

## 2019-10-03 DIAGNOSIS — Z9181 History of falling: Secondary | ICD-10-CM | POA: Diagnosis not present

## 2019-10-03 DIAGNOSIS — I69398 Other sequelae of cerebral infarction: Secondary | ICD-10-CM | POA: Diagnosis not present

## 2019-10-03 DIAGNOSIS — M6281 Muscle weakness (generalized): Secondary | ICD-10-CM | POA: Diagnosis not present

## 2019-10-03 DIAGNOSIS — R2689 Other abnormalities of gait and mobility: Secondary | ICD-10-CM | POA: Diagnosis not present

## 2019-10-04 ENCOUNTER — Other Ambulatory Visit (HOSPITAL_BASED_OUTPATIENT_CLINIC_OR_DEPARTMENT_OTHER): Payer: Self-pay | Admitting: Obstetrics and Gynecology

## 2019-10-04 DIAGNOSIS — Z6841 Body Mass Index (BMI) 40.0 and over, adult: Secondary | ICD-10-CM | POA: Diagnosis not present

## 2019-10-04 DIAGNOSIS — N812 Incomplete uterovaginal prolapse: Secondary | ICD-10-CM | POA: Diagnosis not present

## 2019-10-04 DIAGNOSIS — N390 Urinary tract infection, site not specified: Secondary | ICD-10-CM | POA: Diagnosis not present

## 2019-10-04 DIAGNOSIS — R350 Frequency of micturition: Secondary | ICD-10-CM | POA: Diagnosis not present

## 2019-10-04 DIAGNOSIS — R351 Nocturia: Secondary | ICD-10-CM | POA: Diagnosis not present

## 2019-10-04 DIAGNOSIS — N3281 Overactive bladder: Secondary | ICD-10-CM | POA: Diagnosis not present

## 2019-10-04 MED FILL — FLUTICASONE PROP 50 MCG SPR: 50 | 90 days supply | Qty: 48 | Fill #2

## 2019-10-04 MED FILL — SULFAMETHOXAZOLE-TMP DS TAB: 800-160 | 7 days supply | Qty: 14 | Fill #0

## 2019-10-10 DIAGNOSIS — R2689 Other abnormalities of gait and mobility: Secondary | ICD-10-CM | POA: Diagnosis not present

## 2019-10-10 DIAGNOSIS — L718 Other rosacea: Secondary | ICD-10-CM | POA: Diagnosis not present

## 2019-10-10 DIAGNOSIS — Z9181 History of falling: Secondary | ICD-10-CM | POA: Diagnosis not present

## 2019-10-10 DIAGNOSIS — I69398 Other sequelae of cerebral infarction: Secondary | ICD-10-CM | POA: Diagnosis not present

## 2019-10-10 DIAGNOSIS — L304 Erythema intertrigo: Secondary | ICD-10-CM | POA: Diagnosis not present

## 2019-10-10 DIAGNOSIS — M6281 Muscle weakness (generalized): Secondary | ICD-10-CM | POA: Diagnosis not present

## 2019-10-10 DIAGNOSIS — L820 Inflamed seborrheic keratosis: Secondary | ICD-10-CM | POA: Diagnosis not present

## 2019-10-10 DIAGNOSIS — L821 Other seborrheic keratosis: Secondary | ICD-10-CM | POA: Diagnosis not present

## 2019-10-10 MED FILL — DESONIDE 0.05% CREAM: 0.05 | 30 days supply | Qty: 60 | Fill #0

## 2019-10-13 NOTE — Patient Instructions (Signed)
Good to see you again today!    Health Maintenance After Age 74 After age 41, you are at a higher risk for certain long-term diseases and infections as well as injuries from falls. Falls are a major cause of broken bones and head injuries in people who are older than age 84. Getting regular preventive care can help to keep you healthy and well. Preventive care includes getting regular testing and making lifestyle changes as recommended by your health care provider. Talk with your health care provider about:  Which screenings and tests you should have. A screening is a test that checks for a disease when you have no symptoms.  A diet and exercise plan that is right for you. What should I know about screenings and tests to prevent falls? Screening and testing are the best ways to find a health problem early. Early diagnosis and treatment give you the best chance of managing medical conditions that are common after age 37. Certain conditions and lifestyle choices may make you more likely to have a fall. Your health care provider may recommend:  Regular vision checks. Poor vision and conditions such as cataracts can make you more likely to have a fall. If you wear glasses, make sure to get your prescription updated if your vision changes.  Medicine review. Work with your health care provider to regularly review all of the medicines you are taking, including over-the-counter medicines. Ask your health care provider about any side effects that may make you more likely to have a fall. Tell your health care provider if any medicines that you take make you feel dizzy or sleepy.  Osteoporosis screening. Osteoporosis is a condition that causes the bones to get weaker. This can make the bones weak and cause them to break more easily.  Blood pressure screening. Blood pressure changes and medicines to control blood pressure can make you feel dizzy.  Strength and balance checks. Your health care provider may  recommend certain tests to check your strength and balance while standing, walking, or changing positions.  Foot health exam. Foot pain and numbness, as well as not wearing proper footwear, can make you more likely to have a fall.  Depression screening. You may be more likely to have a fall if you have a fear of falling, feel emotionally low, or feel unable to do activities that you used to do.  Alcohol use screening. Using too much alcohol can affect your balance and may make you more likely to have a fall. What actions can I take to lower my risk of falls? General instructions  Talk with your health care provider about your risks for falling. Tell your health care provider if: ? You fall. Be sure to tell your health care provider about all falls, even ones that seem minor. ? You feel dizzy, sleepy, or off-balance.  Take over-the-counter and prescription medicines only as told by your health care provider. These include any supplements.  Eat a healthy diet and maintain a healthy weight. A healthy diet includes low-fat dairy products, low-fat (lean) meats, and fiber from whole grains, beans, and lots of fruits and vegetables. Home safety  Remove any tripping hazards, such as rugs, cords, and clutter.  Install safety equipment such as grab bars in bathrooms and safety rails on stairs.  Keep rooms and walkways well-lit. Activity   Follow a regular exercise program to stay fit. This will help you maintain your balance. Ask your health care provider what types of exercise are appropriate  for you.  If you need a cane or walker, use it as recommended by your health care provider.  Wear supportive shoes that have nonskid soles. Lifestyle  Do not drink alcohol if your health care provider tells you not to drink.  If you drink alcohol, limit how much you have: ? 0-1 drink a day for women. ? 0-2 drinks a day for men.  Be aware of how much alcohol is in your drink. In the [place] one drink  equals one typical bottle of beer (12 oz), one-half glass of wine (5 oz), or one shot of hard liquor (1 oz).  Do not use any products that contain nicotine or tobacco, such as cigarettes and e-cigarettes. If you need help quitting, ask your health care provider. Summary  Having a healthy lifestyle and getting preventive care can help to protect your health and wellness after age 45.  Screening and testing are the best way to find a health problem early and help you avoid having a fall. Early diagnosis and treatment give you the best chance for managing medical conditions that are more common for people who are older than age 49.  Falls are a major cause of broken bones and head injuries in people who are older than age 21. Take precautions to prevent a fall at home.  Work with your health care provider to learn what changes you can make to improve your health and wellness and to prevent falls. This information is not intended to replace advice given to you by your health care provider. Make sure you discuss any questions you have with your health care provider. Document Revised: 05/31/2018 Document Reviewed: 12/21/2016 Elsevier Patient Education  2020 [employer].

## 2019-10-13 NOTE — Progress Notes (Deleted)
[place] at [facility] [address], [place], [place] [address] [phone]  Date:  10/16/2019   Name:  Jasmin [name]   DOB:  1945/11/01   MRN:  [id number]  PCP:  Darreld Mclean, MD    Chief Complaint: No chief complaint on file.   History of Present Illness:  Jasmin [name] is a 74 y.o. very pleasant female patient who presents with the following:  Here today for annual CPE Medicare-  history of well-controlled diabetes, hypertension, hyperlipidemia, stroke, OSA, obesity Last seen by myself in june  Her urologist is Dr Zigmund Daniel- seen earlier this month DR Camillo Flaming pulmonology [facility]   Foot exam Eye exam mammo UTD Colon UTD Covid, zoster done  Lab Results  Component Value Date   HGBA1C 6.1 (H) 07/01/2019     Patient Active Problem List   Diagnosis Date Noted  . Cough 03/17/2017  . Arthritis 12/16/2016  . UTI (urinary tract infection)   . Stroke ([place])   . Sleep apnea   . Right kidney stone   . Hypertension   . Hyperlipidemia   . Heart murmur   . Diabetes mellitus without complication ([place])   . 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 Mixed hyperlipidemia 08/08/2016  . Controlled type 2 diabetes mellitus without complication, without long-term current use of insulin ([place]) 08/08/2016  . Essential hypertension 08/08/2016  . History of CVA (cerebrovascular accident) 08/08/2016  . Nuclear sclerotic cataract of left eye 07/07/2016  . OAB (overactive bladder) 01/20/2016  . Morbid obesity ([place]) 11/10/2015  . IBS (irritable bowel syndrome) 07/16/2015  . Insomnia 07/16/2015  . Left ventricular hypertrophy 07/16/2015  . Pulmonary hypertension ([place]) 07/16/2015  . 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 Acid reflux disease 03/09/2015  . Aortic stenosis 03/09/2015  . AR (allergic rhinitis) 03/09/2015  .  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 OSA (obstructive sleep apnea) 03/09/2015  . Osteoporosis 03/09/2015  . Vitamin D deficiency 03/09/2015    Past Medical History:  Diagnosis Date  . Acid reflux disease 03/09/2015  . Aortic stenosis 03/09/2015  . AR (allergic rhinitis) 03/09/2015  . Arthritis   . Back pain   . Controlled type 2 diabetes mellitus without complication, without long-term current use of insulin ([place]) 08/08/2016  . Cough   . 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 Decreased hearing   . Diabetes mellitus without complication ([place])   . Dry mouth   . Easy bruising   . Essential hypertension 08/08/2016  . Heart murmur   . 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 History of CVA (cerebrovascular accident) 08/08/2016   Seen on MRI from 08/2015  . Hyperlipidemia   . Hypertension   . 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 IBS (irritable bowel syndrome) 07/16/2015  . Insomnia 07/16/2015  . Joint pain   . Knee pain   . Left ventricular hypertrophy 07/16/2015  . Leg cramping    right leg  . Mixed hyperlipidemia 08/08/2016  . Morbid obesity ([place]) 11/10/2015  . Nasal discharge   . Nuclear sclerotic cataract of left eye 07/07/2016  . OAB (overactive bladder) 01/20/2016  . OSA (obstructive sleep apnea) 03/09/2015  . Osteoporosis 03/09/2015  . 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 Pulmonary hypertension ([place]) 07/16/2015  . Red eyes   . Right kidney stone   . Shortness of breath on exertion   .  Sleep apnea   . Stroke ([place])   . Swelling of both lower extremities   . UTI (urinary tract infection)   . Vitamin D deficiency 03/09/2015    Past Surgical History:  Procedure Laterality Date  . ABDOMINAL HYSTERECTOMY  09/14/2016  . CATARACT EXTRACTION Left   . CATARACT EXTRACTION Left 06/2016  . COLPORRHAPHY N/A 09/14/2016   [facility]  . CYSTOCELE REPAIR    . FRACTURE SURGERY Left 1953   L arm  . FRACTURE SURGERY Left 1985   L ankle  . HERNIA REPAIR  2004  . JOINT  REPLACEMENT Right 2012   R TKA  . JOINT REPLACEMENT Left 2017  . KIDNEY STONE SURGERY    . LAPAROSCOPIC ASSISTED VAGINAL HYSTERECTOMY N/A 09/14/2016   [facility]  . R foot/ankle Right 2013 and 2014   plate and screws lateral foot and tendon removal medial foot in 2013, revision in 2014  . RECTOCELE REPAIR    . URETERAL STENT PLACEMENT    . URETEROSCOPY      Social History   Tobacco Use  . Smoking status: Never Smoker  . Smokeless tobacco: Never Used  Vaping Use  . Vaping Use: Never used  Substance Use Topics  . Alcohol use: Yes    Alcohol/week: 2.0 - 3.0 standard drinks    Types: 2 - 3 Glasses of wine per week    Comment: couple of glasses per week  . Drug use: No    Family History  Problem Relation Age of Onset  . Hyperlipidemia Mother   . Heart disease Mother   . Hypertension Mother   . Stroke Mother   . Thyroid disease Mother   . Hypertension Father   . Heart disease Father     Allergies  Allergen Reactions  . Latex Itching  . Ciprofloxacin Hives  . Metronidazole Hives  . Adhesive [Tape] Rash    Medication list has been reviewed and updated.  Current Outpatient Medications on File Prior to Visit  Medication Sig Dispense Refill  . amoxicillin (AMOXIL) 500 MG capsule Take prior to dentist procedure    . buPROPion (WELLBUTRIN SR) 150 MG 12 hr tablet Take 1 tablet (150 mg total) by mouth daily. 30 tablet 0  . CALCIUM PO Take 1 tablet by mouth daily.    . cephALEXin (KEFLEX) 250 MG capsule Take by mouth at bedtime.    . Cholecalciferol (VITAMIN D3) 2000 units TABS Take by mouth. Take one tablet in the AM    . clopidogrel (PLAVIX) 75 MG tablet Take 1 tablet (75 mg total) by mouth daily. 90 tablet 3  . desmopressin (DDAVP) 0.2 MG tablet Start 1 tablet nightly for 7 days and can increase to a maximum of 3 tablets over 3 weeks to decrease nocturia    . desonide (DESOWEN) 0.05 % cream Apply topically as needed.     [name] econazole nitrate 1 % cream Apply topically as needed.      [name] estradiol (ESTRACE) 0.1 MG/GM vaginal cream Place 1 Applicatorful vaginally at bedtime.    . fluticasone (FLONASE) 50 MCG/ACT nasal spray PLACE 2 SPRAYS INTO BOTH NOSTRILS DAILY. 48 g 3  . losartan (COZAAR) 25 MG tablet Take 1 tablet (25 mg total) by mouth daily. 90 tablet 3  . metFORMIN (GLUCOPHAGE) 500 MG tablet Take 1 tablet (500 mg total) by mouth 2 (two) times daily with a meal. 180 tablet 3  . metroNIDAZOLE (METROCREAM) 0.75 % cream Apply topically 2 (two) times daily.    [name]  mirabegron ER (MYRBETRIQ) 50 MG TB24 tablet Take 50 mg by mouth daily.     . naproxen sodium (ALEVE) 220 MG tablet Take 220 mg by mouth as needed.    . nitrofurantoin, macrocrystal-monohydrate, (MACROBID) 100 MG capsule Take 1 capsule (100 mg total) by mouth 2 (two) times daily. 14 capsule 0  . omega-3 acid ethyl esters (LOVAZA) 1 g capsule Take 2 capsules (2 g total) by mouth 2 (two) times daily. 360 capsule 1  . omeprazole (PRILOSEC) 40 MG capsule     . oxyCODONE-acetaminophen (PERCOCET) 5-325 MG tablet Take 1 tablet by mouth every 8 (eight) hours as needed for severe pain. 15 tablet 0  . Probiotic Product (PROBIOTIC-10 PO) Take 1 capsule by mouth daily.    . PSYLLIUM PO Take by mouth.    . solifenacin (VESICARE) 5 MG tablet Take 10 mg by mouth daily.     . Vitamin D, Ergocalciferol, (DRISDOL) 1.25 MG (50000 UNIT) CAPS capsule TAKE 1 CAPSULE (50,000 UNITS TOTAL) BY MOUTH EVERY 7 (SEVEN) DAYS. 4 capsule 0   No current facility-administered medications on file prior to visit.    Review of Systems:  As per HPI- otherwise negative.   Physical Examination: There were no vitals filed for this visit. There were no vitals filed for this visit. There is no height or weight on file to calculate BMI. Ideal Body Weight:    GEN: no acute distress. HEENT: Atraumatic, Normocephalic.  Ears and Nose: No external deformity. CV: RRR, No M/G/R. No JVD. No thrill. No extra heart sounds. PULM: CTA B, no wheezes,  crackles, rhonchi. No retractions. No resp. distress. No accessory muscle use. ABD: S, NT, ND, +BS. No rebound. No HSM. EXTR: No c/c/e PSYCH: Normally interactive. Conversant.    Assessment and Plan: *** This visit occurred during the SARS-CoV-2 public health emergency.  Safety protocols were in place, including screening questions prior to the visit, additional usage of staff PPE, and extensive cleaning of exam room while observing appropriate contact time as indicated for disinfecting solutions.    Signed Lamar Blinks, MD

## 2019-10-14 DIAGNOSIS — R2689 Other abnormalities of gait and mobility: Secondary | ICD-10-CM | POA: Diagnosis not present

## 2019-10-14 DIAGNOSIS — M6281 Muscle weakness (generalized): Secondary | ICD-10-CM | POA: Diagnosis not present

## 2019-10-14 DIAGNOSIS — Z9181 History of falling: Secondary | ICD-10-CM | POA: Diagnosis not present

## 2019-10-14 DIAGNOSIS — I69398 Other sequelae of cerebral infarction: Secondary | ICD-10-CM | POA: Diagnosis not present

## 2019-10-14 MED FILL — OMEPRAZOLE 40 MG CPDR: 40 | 30 days supply | Qty: 30 | Fill #2

## 2019-10-16 ENCOUNTER — Ambulatory Visit (INDEPENDENT_AMBULATORY_CARE_PROVIDER_SITE_OTHER): Payer: Medicare PPO | Admitting: Family Medicine

## 2019-10-16 ENCOUNTER — Other Ambulatory Visit: Payer: Self-pay

## 2019-10-16 ENCOUNTER — Encounter: Payer: Self-pay | Admitting: Family Medicine

## 2019-10-16 VITALS — BP 132/70 | HR 97 | Resp 18 | Ht 61.0 in | Wt 248.0 lb

## 2019-10-16 DIAGNOSIS — I10 Essential (primary) hypertension: Secondary | ICD-10-CM

## 2019-10-16 DIAGNOSIS — E7849 Other hyperlipidemia: Secondary | ICD-10-CM

## 2019-10-16 DIAGNOSIS — E119 Type 2 diabetes mellitus without complications: Secondary | ICD-10-CM | POA: Diagnosis not present

## 2019-10-16 NOTE — Progress Notes (Addendum)
[place] at [facility] [place], [place], [place] [address] [phone]  Date:  10/16/2019   Name:  Jasmin [name]   DOB:  May 05, 1945   MRN:  [id number]  PCP:  Darreld Mclean, MD    Chief Complaint: Annual Exam   History of Present Illness:  Jasmin [name] is a 74 y.o. very pleasant female patient who presents with the following:  Here today for annual CPE Medicare-  history of well-controlled diabetes, hypertension, hyperlipidemia, stroke, OSA, obesity Last seen by myself in June We did change from lisinopril to losartan in June- this has helped with her cough She is also being treated for reflux  She is doing PT for her balance and this is really helping her  She feels more confident walking  Her urologist is Dr Zigmund Daniel- seen earlier this month DR Camillo Flaming pulmonology [facility]   Foot exam-due today Eye exam- she is seeing every 6 months, most recent visit in February  mammo UTD Colon UTD Covid, zoster done 2nd shot given mid February  Discussed boosters today  Discussed flu shot   She is not able to take statins due to muscle pains She is fasting today- will check her lipids  She is taking lovaza still She is seeing Dr Leafy Ro with [facility] Her pulmonologist advised her that losing weight will help with her breathing  She continues to have issues with pelvic floor control- she has to pee every 30 minutes  They are thinking about placing a neuromodulator per her urologist   Lab Results  Component Value Date   HGBA1C 6.1 (H) 07/01/2019   Her BP at home has looked ok when she checks it   Patient Active Problem List   Diagnosis Date Noted  . Cough 03/17/2017  . Arthritis 12/16/2016  . UTI (urinary tract infection)   . Stroke ([place])   . Sleep apnea   . Right kidney stone   . Hypertension   . Hyperlipidemia   . Heart murmur   . Diabetes mellitus  without complication ([place])   . 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 Mixed hyperlipidemia 08/08/2016  . Controlled type 2 diabetes mellitus without complication, without long-term current use of insulin ([place]) 08/08/2016  . Essential hypertension 08/08/2016  . History of CVA (cerebrovascular accident) 08/08/2016  . Nuclear sclerotic cataract of left eye 07/07/2016  . OAB (overactive bladder) 01/20/2016  . Morbid obesity ([place]) 11/10/2015  . IBS (irritable bowel syndrome) 07/16/2015  . Insomnia 07/16/2015  . Left ventricular hypertrophy 07/16/2015  . Pulmonary hypertension ([place]) 07/16/2015  . 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 Acid reflux disease 03/09/2015  . Aortic stenosis 03/09/2015  . AR (allergic rhinitis) 03/09/2015  . 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 OSA (obstructive sleep apnea) 03/09/2015  . Osteoporosis 03/09/2015  . Vitamin D deficiency 03/09/2015    Past Medical History:  Diagnosis Date  . Acid reflux disease 03/09/2015  . Aortic stenosis 03/09/2015  . AR (allergic rhinitis) 03/09/2015  . Arthritis   . Back pain   . Controlled type 2 diabetes mellitus without complication, without long-term current use of insulin ([place]) 08/08/2016  . Cough   . 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 Decreased hearing   . Diabetes mellitus without complication ([place])   . Dry mouth   . Easy bruising   . Essential hypertension 08/08/2016  .  Heart murmur   . 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 History of CVA (cerebrovascular accident) 08/08/2016   Seen on MRI from 08/2015  . Hyperlipidemia   . Hypertension   . 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 IBS (irritable bowel syndrome) 07/16/2015  . Insomnia 07/16/2015  . Joint pain   . Knee pain   . Left ventricular hypertrophy 07/16/2015  . Leg cramping    right leg  . Mixed hyperlipidemia 08/08/2016  . Morbid obesity ([place]) 11/10/2015  . Nasal discharge    . Nuclear sclerotic cataract of left eye 07/07/2016  . OAB (overactive bladder) 01/20/2016  . OSA (obstructive sleep apnea) 03/09/2015  . Osteoporosis 03/09/2015  . 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 Pulmonary hypertension ([place]) 07/16/2015  . Red eyes   . Right kidney stone   . Shortness of breath on exertion   . Sleep apnea   . Stroke ([place])   . Swelling of both lower extremities   . UTI (urinary tract infection)   . Vitamin D deficiency 03/09/2015    Past Surgical History:  Procedure Laterality Date  . ABDOMINAL HYSTERECTOMY  09/14/2016  . CATARACT EXTRACTION Left   . CATARACT EXTRACTION Left 06/2016  . COLPORRHAPHY N/A 09/14/2016   [facility]  . CYSTOCELE REPAIR    . FRACTURE SURGERY Left 1953   L arm  . FRACTURE SURGERY Left 1985   L ankle  . HERNIA REPAIR  2004  . JOINT REPLACEMENT Right 2012   R TKA  . JOINT REPLACEMENT Left 2017  . KIDNEY STONE SURGERY    . LAPAROSCOPIC ASSISTED VAGINAL HYSTERECTOMY N/A 09/14/2016   [facility]  . R foot/ankle Right 2013 and 2014   plate and screws lateral foot and tendon removal medial foot in 2013, revision in 2014  . RECTOCELE REPAIR    . URETERAL STENT PLACEMENT    . URETEROSCOPY      Social History   Tobacco Use  . Smoking status: Never Smoker  . Smokeless tobacco: Never Used  Vaping Use  . Vaping Use: Never used  Substance Use Topics  . Alcohol use: Yes    Alcohol/week: 2.0 - 3.0 standard drinks    Types: 2 - 3 Glasses of wine per week    Comment: couple of glasses per week  . Drug use: No    Family History  Problem Relation Age of Onset  . Hyperlipidemia Mother   . Heart disease Mother   . Hypertension Mother   . Stroke Mother   . Thyroid disease Mother   . Hypertension Father   . Heart disease Father     Allergies  Allergen Reactions  . Latex Itching  . Ciprofloxacin Hives  . Metronidazole Hives  . Adhesive [Tape] Rash    Medication list has been reviewed and updated.  Current Outpatient  Medications on File Prior to Visit  Medication Sig Dispense Refill  . amoxicillin (AMOXIL) 500 MG capsule Take prior to dentist procedure    . buPROPion (WELLBUTRIN SR) 150 MG 12 hr tablet Take 1 tablet (150 mg total) by mouth daily. 30 tablet 0  . CALCIUM PO Take 1 tablet by mouth daily.    . cephALEXin (KEFLEX) 250 MG capsule Take by mouth at bedtime.    . Cholecalciferol (VITAMIN D3) 2000 units TABS Take by mouth. Take one tablet in the AM    . clopidogrel (PLAVIX) 75 MG tablet Take 1 tablet (75 mg total) by mouth daily. 90 tablet 3  .  desmopressin (DDAVP) 0.2 MG tablet Start 1 tablet nightly for 7 days and can increase to a maximum of 3 tablets over 3 weeks to decrease nocturia    . desonide (DESOWEN) 0.05 % cream Apply topically as needed.     [name] econazole nitrate 1 % cream Apply topically as needed.     [name] estradiol (ESTRACE) 0.1 MG/GM vaginal cream Place 1 Applicatorful vaginally at bedtime.    . fluticasone (FLONASE) 50 MCG/ACT nasal spray PLACE 2 SPRAYS INTO BOTH NOSTRILS DAILY. 48 g 3  . losartan (COZAAR) 25 MG tablet Take 1 tablet (25 mg total) by mouth daily. 90 tablet 3  . metFORMIN (GLUCOPHAGE) 500 MG tablet Take 1 tablet (500 mg total) by mouth 2 (two) times daily with a meal. 180 tablet 3  . metroNIDAZOLE (METROCREAM) 0.75 % cream Apply topically 2 (two) times daily.    . mirabegron ER (MYRBETRIQ) 50 MG TB24 tablet Take 50 mg by mouth daily.     . naproxen sodium (ALEVE) 220 MG tablet Take 220 mg by mouth as needed.    [name] omega-3 acid ethyl esters (LOVAZA) 1 g capsule Take 2 capsules (2 g total) by mouth 2 (two) times daily. 360 capsule 1  . omeprazole (PRILOSEC) 40 MG capsule     . oxyCODONE-acetaminophen (PERCOCET) 5-325 MG tablet Take 1 tablet by mouth every 8 (eight) hours as needed for severe pain. 15 tablet 0  . Probiotic Product (PROBIOTIC-10 PO) Take 1 capsule by mouth daily.    . PSYLLIUM PO Take by mouth.    . solifenacin (VESICARE) 5 MG tablet Take 10 mg by mouth daily.      . Vitamin D, Ergocalciferol, (DRISDOL) 1.25 MG (50000 UNIT) CAPS capsule TAKE 1 CAPSULE (50,000 UNITS TOTAL) BY MOUTH EVERY 7 (SEVEN) DAYS. 4 capsule 0   No current facility-administered medications on file prior to visit.    Review of Systems:  As per HPI- otherwise negative.   Physical Examination: Vitals:   10/16/19 0949  BP: (!) 174/98  Pulse: 97  Resp: 18  SpO2: 95%   Vitals:   10/16/19 0949  Weight: 248 lb (112.5 kg)  Height: 5\' 1"  (1.549 m)   Body mass index is 46.86 kg/m. Ideal Body Weight: Weight in (lb) to have BMI = 25: 132  GEN: no acute distress.  Obese, otherwise looks well HEENT: Atraumatic, Normocephalic.  Ears and Nose: No external deformity. CV: RRR, No M/G/R. No JVD. No thrill. No extra heart sounds. PULM: CTA B, no wheezes, crackles, rhonchi. No retractions. No resp. distress. No accessory muscle use. ABD: S, NT, ND, +BS. No rebound. No HSM. EXTR: No c/c/e PSYCH: Normally interactive. Conversant.  Foot exam normal today.  Unable to document because when I log into quality metrics epic shuts down repeatedly  Assessment and Plan: Essential hypertension - Plan: Basic metabolic panel  Other hyperlipidemia - Plan: Lipid panel  Type 2 diabetes mellitus without complication, without long-term current use of insulin ([place]) - Plan: Hemoglobin A1c  Patient here today for follow-up visit Blood pressures under good control, continue current regimen Labs pending to follow-up on diabetes and lipids Patient has chronic urinary incontinence, discussed the possibility for getting neurostimulator today.  She is thinking about this Discussed immunization history and boosters which may be due Will plan further follow- up pending labs.  This visit occurred during the SARS-CoV-2 public health emergency.  Safety protocols were in place, including screening questions prior to the visit, additional usage of staff PPE,  and extensive cleaning of exam room while  observing appropriate contact time as indicated for disinfecting solutions.    Signed Lamar Blinks, MD  Received labs 8/26, message to patient  Results for orders placed or performed in visit on 58/44/17  Basic metabolic panel  Result Value Ref Range   Glucose, Bld 103 (H) 65 - 99 mg/dL   BUN 14 7 - 25 mg/dL   Creat 0.57 (L) 0.60 - 0.93 mg/dL   BUN/Creatinine Ratio 25 (H) 6 - 22 (calc)   Sodium 138 135 - 146 mmol/L   Potassium 4.3 3.5 - 5.3 mmol/L   Chloride 102 98 - 110 mmol/L   CO2 28 20 - 32 mmol/L   Calcium 9.3 8.6 - 10.4 mg/dL  Lipid panel  Result Value Ref Range   Cholesterol 198 <200 mg/dL   HDL 46 (L) > OR = 50 mg/dL   Triglycerides 229 (H) <150 mg/dL   LDL Cholesterol (Calc) 117 (H) mg/dL (calc)   Total CHOL/HDL Ratio 4.3 <5.0 (calc)   Non-HDL Cholesterol (Calc) 152 (H) <130 mg/dL (calc)  Hemoglobin A1c  Result Value Ref Range   Hgb A1c MFr Bld 6.1 (H) <5.7 % of total Hgb   Mean Plasma Glucose 128 (calc)   eAG (mmol/L) 7.1 (calc)

## 2019-10-17 ENCOUNTER — Encounter: Payer: Self-pay | Admitting: Family Medicine

## 2019-10-17 LAB — HEMOGLOBIN A1C
Hgb A1c MFr Bld: 6.1 % of total Hgb — ABNORMAL HIGH (ref <5.7)
Mean Plasma Glucose: 128 (calc)
eAG (mmol/L): 7.1 (calc)

## 2019-10-17 LAB — BASIC METABOLIC PANEL
BUN/Creatinine Ratio: 25 (calc) — ABNORMAL HIGH (ref 6–22)
BUN: 14 mg/dL (ref 7–25)
CO2: 28 mmol/L (ref 20–32)
Calcium: 9.3 mg/dL (ref 8.6–10.4)
Chloride: 102 mmol/L (ref 98–110)
Creat: 0.57 mg/dL — ABNORMAL LOW (ref 0.60–0.93)
Glucose, Bld: 103 mg/dL — ABNORMAL HIGH (ref 65–99)
Potassium: 4.3 mmol/L (ref 3.5–5.3)
Sodium: 138 mmol/L (ref 135–146)

## 2019-10-17 LAB — LIPID PANEL
Cholesterol: 198 mg/dL (ref <200)
HDL: 46 mg/dL — ABNORMAL LOW (ref >=50)
LDL Cholesterol (Calc): 117 mg/dL (calc) — ABNORMAL HIGH
Non-HDL Cholesterol (Calc): 152 mg/dL (calc) — ABNORMAL HIGH (ref <130)
Total CHOL/HDL Ratio: 4.3 (calc) (ref <5.0)
Triglycerides: 229 mg/dL — ABNORMAL HIGH (ref <150)

## 2019-10-21 ENCOUNTER — Encounter: Payer: Medicare PPO | Admitting: Family Medicine

## 2019-10-21 ENCOUNTER — Other Ambulatory Visit: Payer: Self-pay

## 2019-10-21 ENCOUNTER — Ambulatory Visit (INDEPENDENT_AMBULATORY_CARE_PROVIDER_SITE_OTHER): Payer: Medicare PPO | Admitting: Family Medicine

## 2019-10-21 VITALS — BP 143/76 | HR 89 | Temp 98.0°F | Ht 61.0 in | Wt 249.0 lb

## 2019-10-21 DIAGNOSIS — F3289 Other specified depressive episodes: Secondary | ICD-10-CM | POA: Diagnosis not present

## 2019-10-21 DIAGNOSIS — Z6841 Body Mass Index (BMI) 40.0 and over, adult: Secondary | ICD-10-CM | POA: Diagnosis not present

## 2019-10-21 DIAGNOSIS — E559 Vitamin D deficiency, unspecified: Secondary | ICD-10-CM | POA: Diagnosis not present

## 2019-10-21 MED ORDER — BUPROPION HCL ER (SR) 150 MG PO TB12: 150.0000 mg | ORAL_TABLET | Freq: Every day | ORAL | 0 refills | Status: DC

## 2019-10-21 MED ORDER — VITAMIN D (ERGOCALCIFEROL) 1.25 MG (50000 UNIT) PO CAPS: 50000.0000 [IU] | ORAL_CAPSULE | ORAL | 0 refills | Status: DC

## 2019-10-21 MED FILL — VIT D2 1.25 MG (50,000 UNIT: 1.25 MG | 28 days supply | Qty: 4 | Fill #0

## 2019-10-21 MED FILL — BUPROPION HCL ER (SR) 150 M: 150 | 30 days supply | Qty: 30 | Fill #0

## 2019-10-21 NOTE — Progress Notes (Signed)
Chief Complaint:   OBESITY Jasmin [name] is here to discuss her progress with her obesity treatment plan along with follow-up of her obesity related diagnoses. Jasmin [name] is on the Category 2 Plan and states she is following her eating plan approximately 30% of the time. Jasmin [name] states she is doing PT for 45 minutes 2-3 times per week.  Today's visit was #: 20 Starting weight: 248 lbs Starting date: 06/21/2017 Today's weight: 249 lbs Today's date: 10/21/2019 Total lbs lost to date: 0 Total lbs lost since last in-office visit: 0  Interim History: Jasmin [name]'s last visit in the clinic was 3-4 months ago. She has gone through a lot of health issues, but she is ready to get back on track with her weight loss efforts.  Subjective:   1. Vitamin D deficiency Jasmin [name] is stable on Vit D, and she requests a refill today.  2. Other depression, emotional eating Jasmin [name] notes increased emotional eating. She has been off Wellbutrin for a bit but she is ready to restart.  Assessment/Plan:   1. Vitamin D deficiency Low Vitamin D level contributes to fatigue and are associated with obesity, breast, and colon cancer. We will refill prescription Vitamin D for 1 month, and will follow up closely. Jasmin [name] will follow-up for routine testing of Vitamin D, at least 2-3 times per year to avoid over-replacement.  - Vitamin D, Ergocalciferol, (DRISDOL) 1.25 MG (50000 UNIT) CAPS capsule; Take 1 capsule (50,000 Units total) by mouth every 7 (seven) days.  Dispense: 4 capsule; Refill: 0  2. Other depression, emotional eating Behavior modification techniques were discussed today to help Jasmin [name] deal with her emotional/non-hunger eating behaviors. We will refill Wellbutrin SR for 1 month, and will follow up closely Orders and follow up as documented in patient record.   - buPROPion (WELLBUTRIN SR) 150 MG 12 hr tablet; Take 1 tablet (150 mg total) by mouth daily.  Dispense: 30 tablet; Refill: 0  3. Class 3 severe obesity  with serious comorbidity and body mass index (BMI) of 45.0 to 49.9 in adult, unspecified obesity type ([place]) Jasmin [name] is currently in the action stage of change. As such, her goal is to get back to weightloss efforts . She has agreed to the Category 2 Plan.   Exercise goals: As is.  Behavioral modification strategies: increasing lean protein intake, increasing water intake, no skipping meals and meal planning and cooking strategies.  Jasmin [name] has agreed to follow-up with our clinic in 2 to 3 weeks. She was informed of the importance of frequent follow-up visits to maximize her success with intensive lifestyle modifications for her multiple health conditions.   Objective:   Blood pressure (!) 143/76, pulse 89, temperature 98 F (36.7 C), height 5\' 1"  (1.549 m), weight 249 lb (112.9 kg), SpO2 98 %. Body mass index is 47.05 kg/m.  General: Cooperative, alert, well developed, in no acute distress. HEENT: Conjunctivae and lids unremarkable. Cardiovascular: Regular rhythm.  Lungs: Normal work of breathing. Neurologic: No focal deficits.   Lab Results  Component Value Date   CREATININE 0.57 (L) 10/16/2019   BUN 14 10/16/2019   NA 138 10/16/2019   K 4.3 10/16/2019   CL 102 10/16/2019   CO2 28 10/16/2019   Lab Results  Component Value Date   ALT 12 07/01/2019   AST 20 07/01/2019   ALKPHOS 51 07/01/2019   BILITOT 0.6 07/01/2019   Lab Results  Component Value Date   HGBA1C 6.1 (H) 10/16/2019   HGBA1C 6.1 (H) 07/01/2019  HGBA1C 6.6 (H) 12/31/2018   HGBA1C 6.3 08/29/2018   HGBA1C 6.7 (H) 01/31/2018   Lab Results  Component Value Date   INSULIN 21.1 07/01/2019   INSULIN 24.2 09/28/2017   INSULIN 17.6 06/21/2017   Lab Results  Component Value Date   TSH 1.860 07/01/2019   Lab Results  Component Value Date   CHOL 198 10/16/2019   HDL 46 (L) 10/16/2019   LDLCALC 117 (H) 10/16/2019   LDLDIRECT 66.0 12/14/2016   TRIG 229 (H) 10/16/2019   CHOLHDL 4.3 10/16/2019   Lab  Results  Component Value Date   WBC 6.0 07/01/2019   HGB 13.0 07/01/2019   HCT 37.7 07/01/2019   MCV 89 07/01/2019   PLT 288 07/01/2019   No results found for: IRON, TIBC, FERRITIN  Obesity Behavioral Intervention Documentation for Insurance:   Approximately 15 minutes were spent on the discussion below.  ASK: We discussed the diagnosis of obesity with Jasmin [name] today and Jasmin [name] agreed to give [name] permission to discuss obesity behavioral modification therapy today.  ASSESS: Jasmin [name] has the diagnosis of obesity and her BMI today is 47.07. Jasmin [name] is in the action stage of change.   ADVISE: Jasmin [name] was educated on the multiple health risks of obesity as well as the benefit of weight loss to improve her health. She was advised of the need for long term treatment and the importance of lifestyle modifications to improve her current health and to decrease her risk of future health problems.  AGREE: Multiple dietary modification options and treatment options were discussed and Jasmin [name] agreed to follow the recommendations documented in the above note.  ARRANGE: Jasmin [name] was educated on the importance of frequent visits to treat obesity as outlined per CMS and USPSTF guidelines and agreed to schedule her next follow up appointment today.  Attestation Statements:   Reviewed by clinician on day of visit: allergies, medications, problem list, medical history, surgical history, family history, social history, and previous encounter notes.   I, Trixie Dredge, am acting as transcriptionist for Dennard Nip, MD.  I have reviewed the above documentation for accuracy and completeness, and I agree with the above. -  Dennard Nip, MD

## 2019-10-29 MED FILL — LOSARTAN POTASSIUM 25 MG TA: 25 | 90 days supply | Qty: 90 | Fill #1

## 2019-10-29 MED FILL — SULFAMETHOXAZOLE-TMP DS TAB: 800-160 | 7 days supply | Qty: 14 | Fill #1

## 2019-11-04 MED FILL — SOLIFENACIN SUCCINATE 5 MG: 5 | 45 days supply | Qty: 90 | Fill #1

## 2019-11-11 MED FILL — OMEPRAZOLE 40 MG CPDR: 40 | 30 days supply | Qty: 30 | Fill #3

## 2019-11-11 MED FILL — METFORMIN HCL 500 MG TABS: 500 | 90 days supply | Qty: 180 | Fill #3

## 2019-11-11 MED FILL — OMEGA-3-ACID ETHYL ESTERS 1: 1 | 90 days supply | Qty: 360 | Fill #1

## 2019-11-13 ENCOUNTER — Ambulatory Visit (INDEPENDENT_AMBULATORY_CARE_PROVIDER_SITE_OTHER): Payer: Medicare PPO | Admitting: Family Medicine

## 2019-11-18 ENCOUNTER — Ambulatory Visit (INDEPENDENT_AMBULATORY_CARE_PROVIDER_SITE_OTHER): Payer: Medicare PPO | Admitting: Family Medicine

## 2019-11-18 MED FILL — CLOPIDOGREL 75 MG TABLET: 75 | 90 days supply | Qty: 90 | Fill #1

## 2019-11-29 MED FILL — DESMOPRESSIN ACETATE 0.2 MG: 0.2 | 90 days supply | Qty: 270 | Fill #2

## 2019-12-02 ENCOUNTER — Other Ambulatory Visit (HOSPITAL_BASED_OUTPATIENT_CLINIC_OR_DEPARTMENT_OTHER): Payer: Self-pay | Admitting: Internal Medicine

## 2019-12-02 ENCOUNTER — Ambulatory Visit: Payer: Medicare PPO | Attending: Internal Medicine

## 2019-12-02 DIAGNOSIS — Z23 Encounter for immunization: Secondary | ICD-10-CM

## 2019-12-02 MED FILL — PFIZER-BIONTECH COVID-19 VA: 30 | 1 days supply | Qty: 0 | Fill #0

## 2019-12-02 NOTE — Progress Notes (Signed)
   Covid-19 Vaccination Clinic  Name:  Jasmin [name]    MRN: [id number] DOB: 1945-07-28  12/02/2019  Ms. Weiskopf was observed post Covid-19 immunization for 15 minutes without incident. She was provided with Vaccine Information Sheet and instruction to access the V-Safe system. Vaccinated by Harriet Pho.  Ms. [place] was instructed to call 911 with any severe reactions post vaccine: [name] Difficulty breathing  . Swelling of face and throat  . A fast heartbeat  . A bad rash all over body  . Dizziness and weakness

## 2019-12-03 ENCOUNTER — Ambulatory Visit (INDEPENDENT_AMBULATORY_CARE_PROVIDER_SITE_OTHER): Payer: Medicare PPO | Admitting: Family Medicine

## 2019-12-04 ENCOUNTER — Telehealth: Payer: Self-pay | Admitting: Family Medicine

## 2019-12-04 ENCOUNTER — Encounter: Payer: Self-pay | Admitting: Family Medicine

## 2019-12-04 MED ORDER — BLOOD GLUCOSE MONITOR KIT: PACK | 0 refills | Status: AC

## 2019-12-04 NOTE — Telephone Encounter (Signed)
I will send in but how often would you like for patient to check glucose?

## 2019-12-04 NOTE — Telephone Encounter (Signed)
Caller name: Jannat Call back number: [phone]  Patient states she needs a new Glucose  Monitor machine along with test strips and needles.   [place], [place] [facility]  [place], [place] [address]  Phone:  [phone] Fax:  [phone]

## 2019-12-06 ENCOUNTER — Other Ambulatory Visit (HOSPITAL_BASED_OUTPATIENT_CLINIC_OR_DEPARTMENT_OTHER): Payer: Self-pay | Admitting: Family Medicine

## 2019-12-06 MED ORDER — BLOOD GLUCOSE MONITOR KIT: PACK | 0 refills | Status: DC

## 2019-12-06 MED FILL — ACCU-CHEK GUIDE W/DEVICE KI: W/DEVICE | 1 days supply | Qty: 1 | Fill #0

## 2019-12-06 MED FILL — ACCU-CHEK GUIDE STRP: 90 days supply | Qty: 100 | Fill #0

## 2019-12-06 MED FILL — ACCU-CHEK SOFTCLIX LANCETS: 90 days supply | Qty: 100 | Fill #0

## 2019-12-06 NOTE — Addendum Note (Signed)
Addended by: Lamar Blinks C on: 12/06/2019 07:07 AM   Modules accepted: Orders

## 2019-12-09 ENCOUNTER — Other Ambulatory Visit: Payer: Self-pay | Admitting: Family Medicine

## 2019-12-09 ENCOUNTER — Other Ambulatory Visit: Payer: Self-pay

## 2019-12-09 DIAGNOSIS — E119 Type 2 diabetes mellitus without complications: Secondary | ICD-10-CM

## 2019-12-09 MED ORDER — METFORMIN HCL 500 MG PO TABS: 500.0000 mg | ORAL_TABLET | Freq: Two times a day (BID) | ORAL | 3 refills | Status: DC

## 2019-12-09 MED FILL — OMEPRAZOLE 40 MG CPDR: 40 | 30 days supply | Qty: 30 | Fill #4

## 2019-12-09 MED FILL — CEPHALEXIN 250 MG CAPSULE: 250 | 90 days supply | Qty: 90 | Fill #2

## 2019-12-09 MED FILL — MYRBETRIQ ER 50 MG TABLET: 50 | 90 days supply | Qty: 90 | Fill #2

## 2019-12-16 IMAGING — CR DG RIBS W/ CHEST 3+V*L*
4 series · 4 of 4 positions shown · non-contrast
Comparison: Chest radiograph 05/24/2017

CLINICAL DATA: 73-year-old female with left sided rib pain.

EXAM:
LEFT RIBS AND CHEST - 3+ VIEW

[w chest pa *]
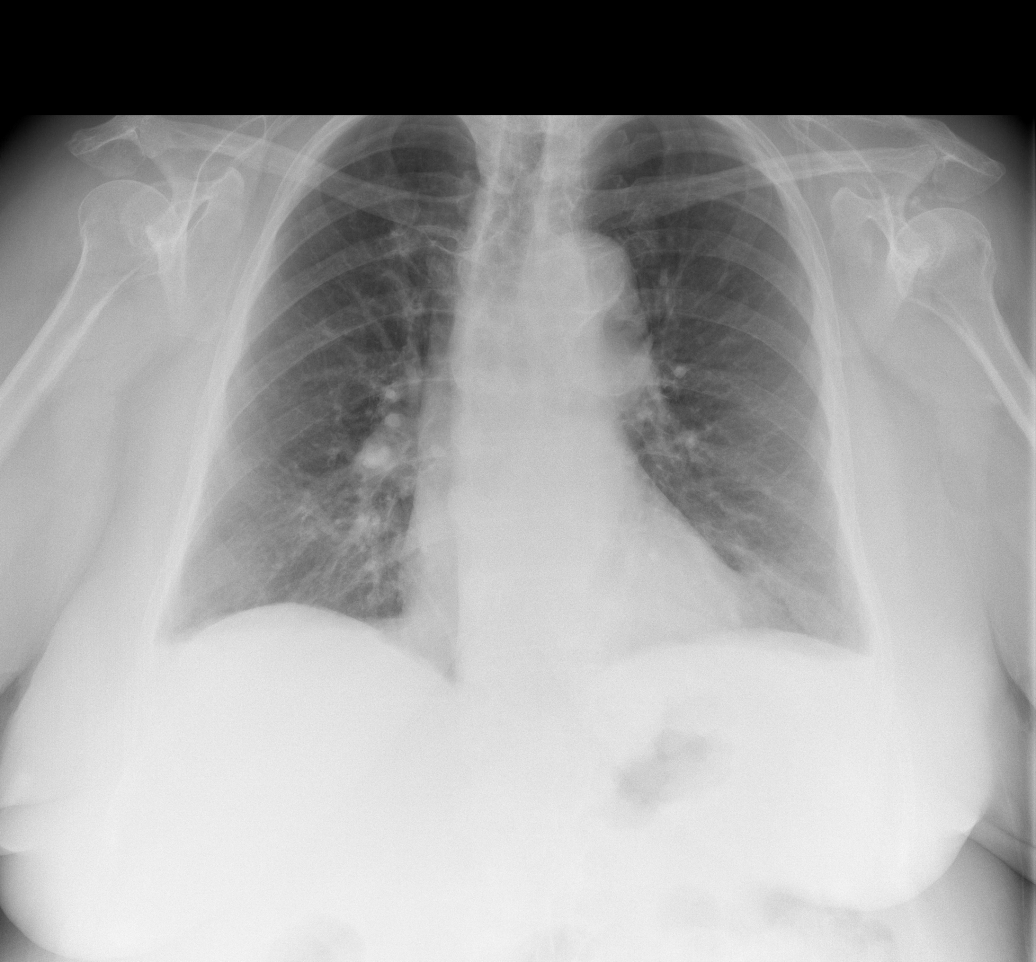

[w ribs ap/pa upper left]
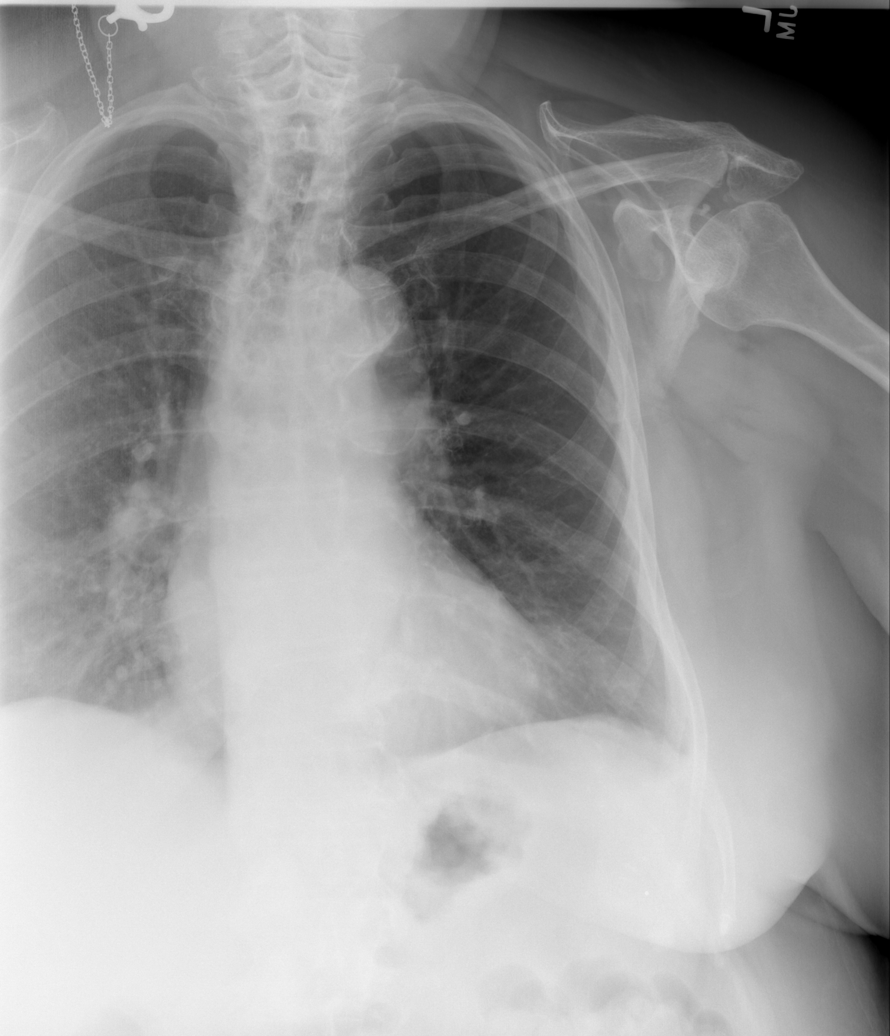

[w ribs ap/pa lower left]
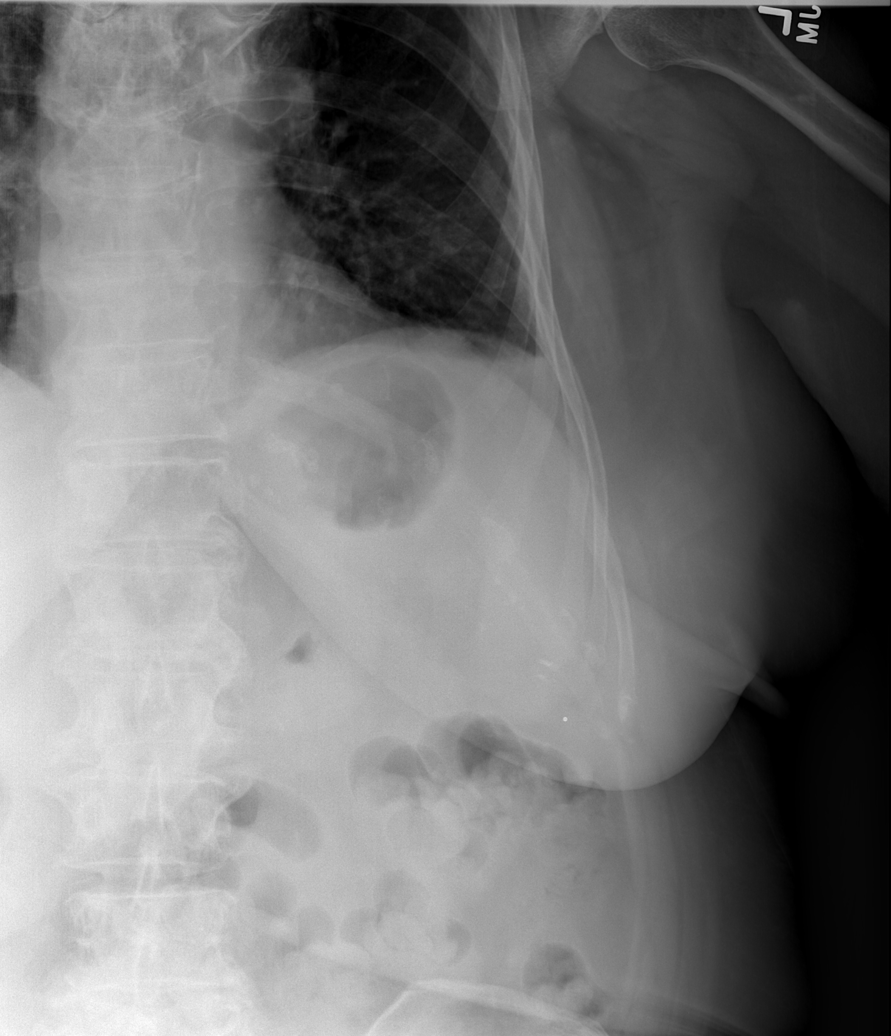

[w ribs oblique left *]
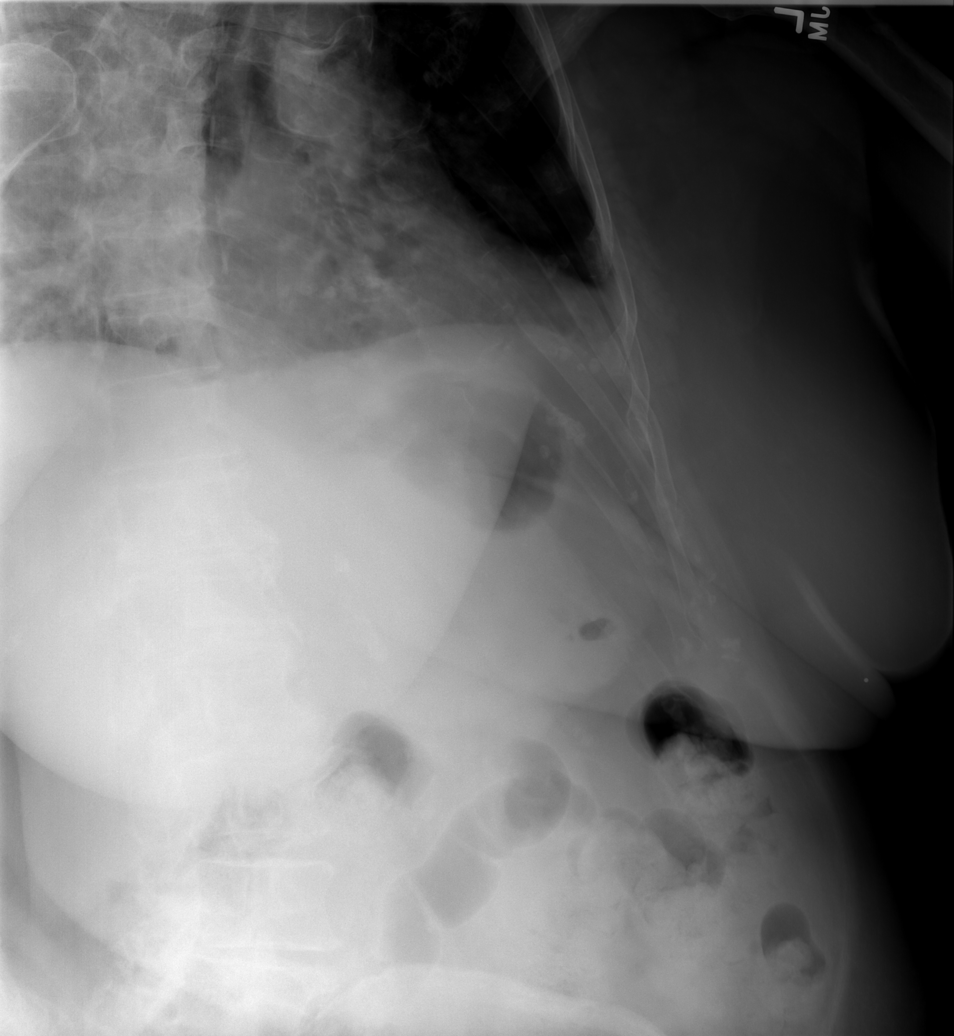

[4 of 4 positions shown; findings below may reference images not displayed]

FINDINGS: The lungs are clear. There is no pleural effusion or pneumothorax.
The cardiac silhouette is within normal limits. There is
atherosclerotic calcification of the aortic arch. Degenerative
changes of the spine and shoulders. No acute osseous pathology. No
displaced rib fractures.
IMPRESSION: 1. No acute cardiopulmonary process.
2. No displaced rib fractures.

## 2019-12-24 ENCOUNTER — Ambulatory Visit (INDEPENDENT_AMBULATORY_CARE_PROVIDER_SITE_OTHER): Payer: Medicare PPO | Admitting: Family Medicine

## 2019-12-31 DIAGNOSIS — G4733 Obstructive sleep apnea (adult) (pediatric): Secondary | ICD-10-CM | POA: Diagnosis not present

## 2020-01-06 MED FILL — FLUTICASONE PROP 50 MCG SPR: 50 | 90 days supply | Qty: 48 | Fill #3

## 2020-01-06 MED FILL — AMOXICILLIN 500 MG CAPSULE: 500 | 3 days supply | Qty: 12 | Fill #1

## 2020-01-08 ENCOUNTER — Ambulatory Visit (INDEPENDENT_AMBULATORY_CARE_PROVIDER_SITE_OTHER): Payer: Medicare PPO | Admitting: Family Medicine

## 2020-01-13 MED FILL — LOSARTAN POTASSIUM 25 MG TA: 25 | 90 days supply | Qty: 90 | Fill #2

## 2020-01-14 DIAGNOSIS — N3281 Overactive bladder: Secondary | ICD-10-CM | POA: Diagnosis not present

## 2020-01-14 DIAGNOSIS — N812 Incomplete uterovaginal prolapse: Secondary | ICD-10-CM | POA: Diagnosis not present

## 2020-01-14 DIAGNOSIS — N390 Urinary tract infection, site not specified: Secondary | ICD-10-CM | POA: Diagnosis not present

## 2020-01-14 DIAGNOSIS — Z6841 Body Mass Index (BMI) 40.0 and over, adult: Secondary | ICD-10-CM | POA: Diagnosis not present

## 2020-01-14 DIAGNOSIS — E6609 Other obesity due to excess calories: Secondary | ICD-10-CM | POA: Diagnosis not present

## 2020-01-15 ENCOUNTER — Other Ambulatory Visit (HOSPITAL_BASED_OUTPATIENT_CLINIC_OR_DEPARTMENT_OTHER): Payer: Self-pay

## 2020-01-15 MED FILL — NITROFURANTOIN MONO-MCR 100: 100 | 5 days supply | Qty: 10 | Fill #0

## 2020-01-20 MED FILL — OMEPRAZOLE 40 MG CPDR: 40 | 30 days supply | Qty: 30 | Fill #5

## 2020-01-27 ENCOUNTER — Other Ambulatory Visit: Payer: Self-pay

## 2020-01-27 ENCOUNTER — Other Ambulatory Visit (INDEPENDENT_AMBULATORY_CARE_PROVIDER_SITE_OTHER): Payer: Self-pay | Admitting: Family Medicine

## 2020-01-27 ENCOUNTER — Ambulatory Visit (INDEPENDENT_AMBULATORY_CARE_PROVIDER_SITE_OTHER): Payer: Medicare PPO | Admitting: Family Medicine

## 2020-01-27 ENCOUNTER — Encounter (INDEPENDENT_AMBULATORY_CARE_PROVIDER_SITE_OTHER): Payer: Self-pay | Admitting: Family Medicine

## 2020-01-27 VITALS — BP 146/65 | HR 78 | Temp 97.8°F | Ht 61.0 in | Wt 244.0 lb

## 2020-01-27 DIAGNOSIS — F3289 Other specified depressive episodes: Secondary | ICD-10-CM

## 2020-01-27 DIAGNOSIS — Z6841 Body Mass Index (BMI) 40.0 and over, adult: Secondary | ICD-10-CM

## 2020-01-27 DIAGNOSIS — I10 Essential (primary) hypertension: Secondary | ICD-10-CM

## 2020-01-27 DIAGNOSIS — E1169 Type 2 diabetes mellitus with other specified complication: Secondary | ICD-10-CM

## 2020-01-27 DIAGNOSIS — E559 Vitamin D deficiency, unspecified: Secondary | ICD-10-CM | POA: Diagnosis not present

## 2020-01-27 MED ORDER — BUPROPION HCL ER (SR) 150 MG PO TB12: 150.0000 mg | ORAL_TABLET | Freq: Every day | ORAL | 0 refills | Status: DC

## 2020-01-27 MED ORDER — VITAMIN D (ERGOCALCIFEROL) 1.25 MG (50000 UNIT) PO CAPS: 50000.0000 [IU] | ORAL_CAPSULE | ORAL | 0 refills | Status: DC

## 2020-01-27 MED FILL — VIT D2 1.25 MG (50,000 UNIT: 1.25 MG | 28 days supply | Qty: 4 | Fill #0

## 2020-01-27 MED FILL — BUPROPION HCL ER (SR) 150 M: 150 | 30 days supply | Qty: 30 | Fill #0

## 2020-01-27 NOTE — Telephone Encounter (Signed)
Dr.Beasley 

## 2020-01-28 ENCOUNTER — Encounter: Payer: Self-pay | Admitting: Family Medicine

## 2020-01-28 NOTE — Progress Notes (Signed)
Chief Complaint:   OBESITY Jasmin [name] is here to discuss her progress with her obesity treatment plan along with follow-up of her obesity related diagnoses. Jasmin [name] is on the Category 2 Plan and states she is following her eating plan approximately 20% of the time. Jasmin [name] states she is walking <10,000 steps.  Today's visit was #: 21 Starting weight: 248 lbs Starting date: 06/21/2017 Today's weight: 244 lbs Today's date: 01/27/2020 Total lbs lost to date: 4 Total lbs lost since last in-office visit: 5  Interim History: Jasmin [name] has increased walking recently, and she did some celebration eating over Thanksgiving but she has tried to be mindful.  Subjective:   1. Vitamin D deficiency Jasmin [name] is stable on Vit D, and she requests a refill today.  2. Type 2 diabetes mellitus with other specified complication, without long-term current use of insulin (Jasmin [name]) Jasmin [name] notes her fasting BGs has increased slightly to 120-130's. Last A1c per the patient was 6.1, so it is still well controlled. She denies signs of hypoglycemia.  3. Essential hypertension Jasmin [name]'s systolic blood pressure has been 150-160 in the morning lately. She had her lisinopril discontinued and losartan started recently.  4. Other depression, emotional eating Jasmin [name] ran out of her medications for a few days, and she notes increased cravings and emotional eating.  Assessment/Plan:   1. Vitamin D deficiency Low Vitamin D level contributes to fatigue and are associated with obesity, breast, and colon cancer. We will refill prescription Vitamin D for 1 month. Jasmin [name] will follow-up for routine testing of Vitamin D, at least 2-3 times per year to avoid over-replacement.  - Vitamin D, Ergocalciferol, (DRISDOL) 1.25 MG (50000 UNIT) CAPS capsule; Take 1 capsule (50,000 Units total) by mouth every 7 (seven) days.  Dispense: 4 capsule; Refill: 0  2. Type 2 diabetes mellitus with other specified complication, without long-term  current use of insulin (H[facility]) Good blood sugar control is important to decrease the likelihood of diabetic complications such as nephropathy, neuropathy, limb loss, blindness, coronary artery disease, and death. Intensive lifestyle modification including diet, exercise and weight loss are the first line of treatment for diabetes. Jasmin [name] will continue diet and exercise, and will continue to monitor.  3. Essential hypertension Jasmin [name] will continue her medications as is, and will continue working on healthy weight loss, diet, and exercise to improve blood pressure control. We will watch for signs of hypotension as she continues her lifestyle modifications.  4. Other depression, emotional eating Behavior modification techniques were discussed today to help Jasmin [name] deal with her emotional/non-hunger eating behaviors.  Orders and follow up as documented in patient record.   - buPROPion (WELLBUTRIN SR) 150 MG 12 hr tablet; Take 1 tablet (150 mg total) by mouth daily.  Dispense: 30 tablet; Refill: 0  5. Class 3 severe obesity with serious comorbidity and body mass index (BMI) of 45.0 to 49.9 in adult, unspecified obesity type (Jasmin [name]) Jasmin [name] is currently in the action stage of change. As such, her goal is to continue with weight loss efforts. She has agreed to the Category 2 Plan (strict).   Exercise goals: As is.  Behavioral modification strategies: increasing lean protein intake and meal planning and cooking strategies.  Jasmin [name] has agreed to follow-up with our clinic in 4 weeks. She was informed of the importance of frequent follow-up visits to maximize her success with intensive lifestyle modifications for her multiple health conditions.   Objective:   Blood pressure (!) 146/65, pulse 78, temperature 97.8 F (36.6 C), height 5'  1" (1.549 m), weight 244 lb (110.7 kg), SpO2 96 %. Body mass index is 46.1 kg/m.  General: Cooperative, alert, well developed, in no acute distress. HEENT: Conjunctivae  and lids unremarkable. Cardiovascular: Regular rhythm.  Lungs: Normal work of breathing. Neurologic: No focal deficits.   Lab Results  Component Value Date   CREATININE 0.57 (L) 10/16/2019   BUN 14 10/16/2019   NA 138 10/16/2019   K 4.3 10/16/2019   CL 102 10/16/2019   CO2 28 10/16/2019   Lab Results  Component Value Date   ALT 12 07/01/2019   AST 20 07/01/2019   ALKPHOS 51 07/01/2019   BILITOT 0.6 07/01/2019   Lab Results  Component Value Date   HGBA1C 6.1 (H) 10/16/2019   HGBA1C 6.1 (H) 07/01/2019   HGBA1C 6.6 (H) 12/31/2018   HGBA1C 6.3 08/29/2018   HGBA1C 6.7 (H) 01/31/2018   Lab Results  Component Value Date   INSULIN 21.1 07/01/2019   INSULIN 24.2 09/28/2017   INSULIN 17.6 06/21/2017   Lab Results  Component Value Date   TSH 1.860 07/01/2019   Lab Results  Component Value Date   CHOL 198 10/16/2019   HDL 46 (L) 10/16/2019   LDLCALC 117 (H) 10/16/2019   LDLDIRECT 66.0 12/14/2016   TRIG 229 (H) 10/16/2019   CHOLHDL 4.3 10/16/2019   Lab Results  Component Value Date   WBC 6.0 07/01/2019   HGB 13.0 07/01/2019   HCT 37.7 07/01/2019   MCV 89 07/01/2019   PLT 288 07/01/2019   No results found for: IRON, TIBC, FERRITIN  Obesity Behavioral Intervention:   Approximately 15 minutes were spent on the discussion below.  ASK: We discussed the diagnosis of obesity with Jasmin [name] today and Jasmin [name] agreed to give [name] permission to discuss obesity behavioral modification therapy today.  ASSESS: Jasmin [name] has the diagnosis of obesity and her BMI today is 46.13. Jasmin [name] is in the action stage of change.   ADVISE: Jasmin [name] was educated on the multiple health risks of obesity as well as the benefit of weight loss to improve her health. She was advised of the need for long term treatment and the importance of lifestyle modifications to improve her current health and to decrease her risk of future health problems.  AGREE: Multiple dietary modification options and  treatment options were discussed and Jasmin [name] agreed to follow the recommendations documented in the above note.  ARRANGE: Jasmin [name] was educated on the importance of frequent visits to treat obesity as outlined per CMS and USPSTF guidelines and agreed to schedule her next follow up appointment today.  Attestation Statements:   Reviewed by clinician on day of visit: allergies, medications, problem list, medical history, surgical history, family history, social history, and previous encounter notes.   I, Jasmin [name], am acting as transcriptionist for Dennard Nip, MD.  I have reviewed the above documentation for accuracy and completeness, and I agree with the above. -  Dennard Nip, MD

## 2020-02-03 ENCOUNTER — Encounter: Payer: Self-pay | Admitting: Family Medicine

## 2020-02-03 ENCOUNTER — Other Ambulatory Visit: Payer: Self-pay | Admitting: Family Medicine

## 2020-02-03 DIAGNOSIS — E782 Mixed hyperlipidemia: Secondary | ICD-10-CM

## 2020-02-03 DIAGNOSIS — I10 Essential (primary) hypertension: Secondary | ICD-10-CM

## 2020-02-03 MED ORDER — LOSARTAN POTASSIUM 50 MG PO TABS
50.0000 mg | ORAL_TABLET | Freq: Every day | ORAL | 3 refills | Status: DC
Start: 2020-02-03 — End: 2020-02-03

## 2020-02-03 MED FILL — METFORMIN HCL 500 MG TABS: 500 | 90 days supply | Qty: 180 | Fill #0

## 2020-02-03 MED FILL — LOSARTAN POTASSIUM 50 MG TA: 50 | 90 days supply | Qty: 90 | Fill #0

## 2020-02-03 MED FILL — OMEGA-3-ACID ETHYL ESTERS 1: 1 | 90 days supply | Qty: 360 | Fill #0

## 2020-02-10 ENCOUNTER — Ambulatory Visit: Payer: Medicare PPO | Admitting: Family Medicine

## 2020-02-12 MED FILL — CLOPIDOGREL 75 MG TABLET: 75 | 90 days supply | Qty: 90 | Fill #2

## 2020-02-12 MED FILL — OMEPRAZOLE 40 MG CPDR: 40 | 30 days supply | Qty: 30 | Fill #6

## 2020-02-24 ENCOUNTER — Ambulatory Visit (INDEPENDENT_AMBULATORY_CARE_PROVIDER_SITE_OTHER): Payer: Medicare PPO | Admitting: Family Medicine

## 2020-02-25 ENCOUNTER — Other Ambulatory Visit: Payer: Self-pay

## 2020-02-25 ENCOUNTER — Ambulatory Visit (INDEPENDENT_AMBULATORY_CARE_PROVIDER_SITE_OTHER): Payer: Medicare PPO | Admitting: Family Medicine

## 2020-02-25 ENCOUNTER — Encounter (INDEPENDENT_AMBULATORY_CARE_PROVIDER_SITE_OTHER): Payer: Self-pay | Admitting: Family Medicine

## 2020-02-25 ENCOUNTER — Other Ambulatory Visit (INDEPENDENT_AMBULATORY_CARE_PROVIDER_SITE_OTHER): Payer: Self-pay | Admitting: Family Medicine

## 2020-02-25 VITALS — BP 155/73 | HR 66 | Temp 97.9°F | Ht 61.0 in | Wt 242.0 lb

## 2020-02-25 DIAGNOSIS — F3289 Other specified depressive episodes: Secondary | ICD-10-CM

## 2020-02-25 DIAGNOSIS — E559 Vitamin D deficiency, unspecified: Secondary | ICD-10-CM | POA: Diagnosis not present

## 2020-02-25 DIAGNOSIS — I10 Essential (primary) hypertension: Secondary | ICD-10-CM

## 2020-02-25 DIAGNOSIS — Z6841 Body Mass Index (BMI) 40.0 and over, adult: Secondary | ICD-10-CM | POA: Diagnosis not present

## 2020-02-25 MED ORDER — VITAMIN D (ERGOCALCIFEROL) 1.25 MG (50000 UNIT) PO CAPS: 50000.0000 [IU] | ORAL_CAPSULE | ORAL | 0 refills | Status: DC

## 2020-02-25 MED ORDER — BUPROPION HCL ER (SR) 150 MG PO TB12: 150.0000 mg | ORAL_TABLET | Freq: Every day | ORAL | 0 refills | Status: DC

## 2020-02-25 MED FILL — BUPROPION HCL ER (SR) 150 M: 150 | 30 days supply | Qty: 30 | Fill #0

## 2020-02-25 MED FILL — VIT D2 1.25 MG (50,000 UNIT: 1.25 MG | 28 days supply | Qty: 4 | Fill #0

## 2020-02-27 NOTE — Progress Notes (Signed)
Chief Complaint:   OBESITY Jasmin [name] is here to discuss her progress with her obesity treatment plan along with follow-up of her obesity related diagnoses. Jasmin [name] is on the Category 2 Plan (strict) and states she is following her eating plan approximately 25-30% of the time. Jasmin [name] states she is walking 7,000-10,000 steps per day, and generic moving exercises for 10-20 minutes 3-7 times per week.  Today's visit was #: 22 Starting weight: 248 lbs Starting date: 06/21/2017 Today's weight: 242 lbs Today's date: 02/25/2020 Total lbs lost to date: 6 Total lbs lost since last in-office visit: 2  Interim History: Jasmin [name] did well with weight loss even over the holiday. She was able to walk much of the time and did well with portion control and smarter choices. She is happy with her Category 2 plan overall.  Subjective:   1. Vitamin D deficiency Jasmin [name] is stable on Vit D, and she denies nausea, vomiting, or muscle weakness. She requests a refill today.  2. Essential hypertension Jasmin [name]'s blood pressure medications were changed, and her blood pressure have been borderline elevated. She is following up with Dr. Patsy [name] and trying to improve her blood pressure with diet and weight loss.  3. Other depression, emotional eating Jasmin [name] is doing well with minimizing emotional eating. She denies insomnia but her blood pressure is borderline elevated.  Assessment/Plan:   1. Vitamin D deficiency Low Vitamin D level contributes to fatigue and are associated with obesity, breast, and colon cancer. We will refill prescription Vitamin D for 1 month, and we will recheck labs in 1-2 months. Jasmin [name] will follow-up for routine testing of Vitamin D, at least 2-3 times per year to avoid over-replacement.  - Vitamin D, Ergocalciferol, (DRISDOL) 1.25 MG (50000 UNIT) CAPS capsule; Take 1 capsule (50,000 Units total) by mouth every 7 (seven) days.  Dispense: 4 capsule; Refill: 0  2. Essential  hypertension Jasmin [name] will continue to work on decreasing Na+ in her diet, healthy weight loss and exercise to improve blood pressure control. We will continue to to monitor closely. She will watch for signs of hypotension as she continues her lifestyle modifications.  3. Other depression, emotional eating Behavior modification techniques were discussed today to help Jasmin [name] deal with her emotional/non-hunger eating behaviors. We will refill Wellbutrin SR for 1 month. Jasmin [name] will continue to work on weight loss as her blood pressure does not seem to be elevated due to Wellbutrin. Orders and follow up as documented in patient record.   - buPROPion (WELLBUTRIN SR) 150 MG 12 hr tablet; Take 1 tablet (150 mg total) by mouth daily.  Dispense: 30 tablet; Refill: 0  4. Class 3 severe obesity with serious comorbidity and body mass index (BMI) of 45.0 to 49.9 in adult, unspecified obesity type ([facility]) Jasmin [name] is currently in the action stage of change. As such, her goal is to continue with weight loss efforts. She has agreed to the Category 2 Plan.   Exercise goals: As is.  Behavioral modification strategies: increasing lean protein intake and meal planning and cooking strategies.  Jasmin [name] has agreed to follow-up with our clinic in 3 weeks. She was informed of the importance of frequent follow-up visits to maximize her success with intensive lifestyle modifications for her multiple health conditions.   Objective:   Blood pressure (!) 155/73, pulse 66, temperature 97.9 F (36.6 C), height 5\' 1"  (1.549 m), weight 242 lb (109.8 kg), SpO2 98 %. Body mass index is 45.73 kg/m.  General: Cooperative, alert, well developed, in  no acute distress. HEENT: Conjunctivae and lids unremarkable. Cardiovascular: Regular rhythm.  Lungs: Normal work of breathing. Neurologic: No focal deficits.   Lab Results  Component Value Date   CREATININE 0.57 (L) 10/16/2019   BUN 14 10/16/2019   NA 138 10/16/2019   K 4.3  10/16/2019   CL 102 10/16/2019   CO2 28 10/16/2019   Lab Results  Component Value Date   ALT 12 07/01/2019   AST 20 07/01/2019   ALKPHOS 51 07/01/2019   BILITOT 0.6 07/01/2019   Lab Results  Component Value Date   HGBA1C 6.1 (H) 10/16/2019   HGBA1C 6.1 (H) 07/01/2019   HGBA1C 6.6 (H) 12/31/2018   HGBA1C 6.3 08/29/2018   HGBA1C 6.7 (H) 01/31/2018   Lab Results  Component Value Date   INSULIN 21.1 07/01/2019   INSULIN 24.2 09/28/2017   INSULIN 17.6 06/21/2017   Lab Results  Component Value Date   TSH 1.860 07/01/2019   Lab Results  Component Value Date   CHOL 198 10/16/2019   HDL 46 (L) 10/16/2019   LDLCALC 117 (H) 10/16/2019   LDLDIRECT 66.0 12/14/2016   TRIG 229 (H) 10/16/2019   CHOLHDL 4.3 10/16/2019   Lab Results  Component Value Date   WBC 6.0 07/01/2019   HGB 13.0 07/01/2019   HCT 37.7 07/01/2019   MCV 89 07/01/2019   PLT 288 07/01/2019   No results found for: IRON, TIBC, FERRITIN  Obesity Behavioral Intervention:   Approximately 15 minutes were spent on the discussion below.  ASK: We discussed the diagnosis of obesity with Jasmin [name] today and Jasmin [name] agreed to give [name] permission to discuss obesity behavioral modification therapy today.  ASSESS: Jasmin [name] has the diagnosis of obesity and her BMI today is 45.75. Jasmin [name] is in the action stage of change.   ADVISE: Jasmin [name] was educated on the multiple health risks of obesity as well as the benefit of weight loss to improve her health. She was advised of the need for long term treatment and the importance of lifestyle modifications to improve her current health and to decrease her risk of future health problems.  AGREE: Multiple dietary modification options and treatment options were discussed and Jasmin [name] agreed to follow the recommendations documented in the above note.  ARRANGE: Jasmin [name] was educated on the importance of frequent visits to treat obesity as outlined per CMS and USPSTF guidelines and agreed  to schedule her next follow up appointment today.  Attestation Statements:   Reviewed by clinician on day of visit: allergies, medications, problem list, medical history, surgical history, family history, social history, and previous encounter notes.   I, Trixie Dredge, am acting as transcriptionist for Dennard Nip, MD.  I have reviewed the above documentation for accuracy and completeness, and I agree with the above. -  Dennard Nip, MD

## 2020-02-28 MED FILL — DESMOPRESSIN ACETATE 0.2 MG: 0.2 | 90 days supply | Qty: 270 | Fill #3

## 2020-03-06 DIAGNOSIS — Z7984 Long term (current) use of oral hypoglycemic drugs: Secondary | ICD-10-CM | POA: Diagnosis not present

## 2020-03-06 DIAGNOSIS — H5203 Hypermetropia, bilateral: Secondary | ICD-10-CM | POA: Diagnosis not present

## 2020-03-06 DIAGNOSIS — H2511 Age-related nuclear cataract, right eye: Secondary | ICD-10-CM | POA: Diagnosis not present

## 2020-03-06 DIAGNOSIS — H43813 Vitreous degeneration, bilateral: Secondary | ICD-10-CM | POA: Diagnosis not present

## 2020-03-06 DIAGNOSIS — H26492 Other secondary cataract, left eye: Secondary | ICD-10-CM | POA: Diagnosis not present

## 2020-03-06 DIAGNOSIS — H16223 Keratoconjunctivitis sicca, not specified as Sjogren's, bilateral: Secondary | ICD-10-CM | POA: Diagnosis not present

## 2020-03-06 DIAGNOSIS — Z961 Presence of intraocular lens: Secondary | ICD-10-CM | POA: Diagnosis not present

## 2020-03-06 DIAGNOSIS — H52203 Unspecified astigmatism, bilateral: Secondary | ICD-10-CM | POA: Diagnosis not present

## 2020-03-06 DIAGNOSIS — E119 Type 2 diabetes mellitus without complications: Secondary | ICD-10-CM | POA: Diagnosis not present

## 2020-03-12 ENCOUNTER — Ambulatory Visit (INDEPENDENT_AMBULATORY_CARE_PROVIDER_SITE_OTHER): Payer: Medicare PPO | Admitting: Family Medicine

## 2020-03-13 ENCOUNTER — Other Ambulatory Visit (HOSPITAL_BASED_OUTPATIENT_CLINIC_OR_DEPARTMENT_OTHER): Payer: Self-pay | Admitting: Obstetrics and Gynecology

## 2020-03-13 MED FILL — MYRBETRIQ ER 50 MG TABLET: 50 | 90 days supply | Qty: 90 | Fill #3

## 2020-03-13 MED FILL — CEPHALEXIN 250 MG CAPSULE: 250 | 90 days supply | Qty: 90 | Fill #0

## 2020-03-13 MED FILL — OMEPRAZOLE 40 MG CPDR: 40 | 30 days supply | Qty: 30 | Fill #7

## 2020-03-30 MED FILL — SULFAMETHOXAZOLE-TMP DS TAB: 800-160 | 7 days supply | Qty: 14 | Fill #2

## 2020-04-06 ENCOUNTER — Ambulatory Visit (INDEPENDENT_AMBULATORY_CARE_PROVIDER_SITE_OTHER): Payer: Medicare PPO | Admitting: Family Medicine

## 2020-04-17 ENCOUNTER — Other Ambulatory Visit (HOSPITAL_BASED_OUTPATIENT_CLINIC_OR_DEPARTMENT_OTHER): Payer: Self-pay | Admitting: Obstetrics and Gynecology

## 2020-04-17 DIAGNOSIS — R350 Frequency of micturition: Secondary | ICD-10-CM | POA: Diagnosis not present

## 2020-04-17 DIAGNOSIS — R3915 Urgency of urination: Secondary | ICD-10-CM | POA: Diagnosis not present

## 2020-04-17 DIAGNOSIS — R351 Nocturia: Secondary | ICD-10-CM | POA: Diagnosis not present

## 2020-04-17 MED FILL — SOLIFENACIN SUCCINATE 10 MG: 10 | 30 days supply | Qty: 30 | Fill #0

## 2020-04-17 NOTE — Progress Notes (Addendum)
[place] at [facility] [address] [place], [place] [address] [phone]  Date:  04/20/2020   Name:  Jasmin [name]   DOB:  [birth year]   MRN:  [id number]  PCP:  Darreld Mclean, MD    Chief Complaint: Hypertension and Diabetes (6 month follow up/)   History of Present Illness:  Jasmin [name] is a 75 y.o. very pleasant female patient who presents with the following:  6 month follow-up today/ physical exam  Last seen by myself in August - history ofwell-controlleddiabetes, hypertension, hyperlipidemia, stroke, OSA, obesity  She uses a HD placard at times for knee or ankle pain.  She tries not to use it but does like to keep one current -needs an update today  Her urologist is Dr Zigmund Daniel- seen earlier this month She also consulted with Dr Diona Fanti recently for a 2nd opinion and really liked him.  She plans to continue to follow-up with [facility] at this time  Dr Camillo Flaming pulmonology- seen annually.  Pt reports good compliance with her CPAP machine  [facility]   Foot exam due Eye exam 03/02/20 covid done Flu UTD shingrix done  mammo is due next month Fasting today   Lab Results  Component Value Date   HGBA1C 6.1 (H) 10/16/2019   Wt Readings from Last 3 Encounters:  04/20/20 252 lb (114.3 kg)  02/25/20 242 lb (109.8 kg)  01/27/20 244 lb (110.7 kg)   She notes that she gained a few lbs as she "took a break" from her weight loss efforts but she plans to get back at it   Patient Active Problem List   Diagnosis Date Noted  . Cough 03/17/2017  . Arthritis 12/16/2016  . UTI (urinary tract infection)   . Stroke ([place])   . Sleep apnea   . Right kidney stone   . Hypertension   . Hyperlipidemia   . Heart murmur   . Diabetes mellitus without complication ([place])   . 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 Mixed hyperlipidemia  08/08/2016  . Controlled type 2 diabetes mellitus without complication, without long-term current use of insulin ([place]) 08/08/2016  . Essential hypertension 08/08/2016  . History of CVA (cerebrovascular accident) 08/08/2016  . Nuclear sclerotic cataract of left eye 07/07/2016  . OAB (overactive bladder) 01/20/2016  . Morbid obesity ([place]) 11/10/2015  . IBS (irritable bowel syndrome) 07/16/2015  . Insomnia 07/16/2015  . Left ventricular hypertrophy 07/16/2015  . Pulmonary hypertension ([place]) 07/16/2015  . 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 Acid reflux disease 03/09/2015  . Aortic stenosis 03/09/2015  . AR (allergic rhinitis) 03/09/2015  . 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 OSA (obstructive sleep apnea) 03/09/2015  . Osteoporosis 03/09/2015  . Vitamin D deficiency 03/09/2015    Past Medical History:  Diagnosis Date  . Acid reflux disease 03/09/2015  . Aortic stenosis 03/09/2015  . AR (allergic rhinitis) 03/09/2015  . Arthritis   . Back pain   . Controlled type 2 diabetes mellitus without complication, without long-term current use of insulin ([place]) 08/08/2016  . Cough   . 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 Decreased hearing   . Diabetes mellitus without complication ([place])   . Dry mouth   . Easy bruising   . Essential hypertension 08/08/2016  . Heart murmur   . 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 History of CVA (cerebrovascular accident)  08/08/2016   Seen on MRI from 08/2015  . Hyperlipidemia   . Hypertension   . 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 IBS (irritable bowel syndrome) 07/16/2015  . Insomnia 07/16/2015  . Joint pain   . Knee pain   . Left ventricular hypertrophy 07/16/2015  . Leg cramping    right leg  . Mixed hyperlipidemia 08/08/2016  . Morbid obesity ([place]) 11/10/2015  . Nasal discharge   . Nuclear sclerotic cataract of left eye 07/07/2016  . OAB (overactive bladder) 01/20/2016  . OSA (obstructive sleep apnea) 03/09/2015  .  Osteoporosis 03/09/2015  . 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 Pulmonary hypertension ([place]) 07/16/2015  . Red eyes   . Right kidney stone   . Shortness of breath on exertion   . Sleep apnea   . Stroke ([place])   . Swelling of both lower extremities   . UTI (urinary tract infection)   . Vitamin D deficiency 03/09/2015    Past Surgical History:  Procedure Laterality Date  . ABDOMINAL HYSTERECTOMY  09/14/2016  . CATARACT EXTRACTION Left   . CATARACT EXTRACTION Left 06/2016  . COLPORRHAPHY N/A 09/14/2016   [facility]  . CYSTOCELE REPAIR    . FRACTURE SURGERY Left 1953   L arm  . FRACTURE SURGERY Left 1985   L ankle  . HERNIA REPAIR  2004  . JOINT REPLACEMENT Right 2012   R TKA  . JOINT REPLACEMENT Left 2017  . KIDNEY STONE SURGERY    . LAPAROSCOPIC ASSISTED VAGINAL HYSTERECTOMY N/A 09/14/2016   [facility]  . R foot/ankle Right 2013 and 2014   plate and screws lateral foot and tendon removal medial foot in 2013, revision in 2014  . RECTOCELE REPAIR    . URETERAL STENT PLACEMENT    . URETEROSCOPY      Social History   Tobacco Use  . Smoking status: Never Smoker  . Smokeless tobacco: Never Used  Vaping Use  . Vaping Use: Never used  Substance Use Topics  . Alcohol use: Yes    Alcohol/week: 2.0 - 3.0 standard drinks    Types: 2 - 3 Glasses of wine per week    Comment: couple of glasses per week  . Drug use: No    Family History  Problem Relation Age of Onset  . Hyperlipidemia Mother   . Heart disease Mother   . Hypertension Mother   . Stroke Mother   . Thyroid disease Mother   . Hypertension Father   . Heart disease Father     Allergies  Allergen Reactions  . Latex Itching  . Ciprofloxacin Hives  . Metronidazole Hives  . Adhesive [Tape] Rash    Medication list has been reviewed and updated.  Current Outpatient Medications on File Prior to Visit  Medication Sig Dispense Refill  . amoxicillin (AMOXIL) 500 MG capsule Take prior to dentist procedure    .  blood glucose meter kit and supplies KIT Dispense based on patient and insurance preference. Use once daily to monitor glucose as needed (FOR ICD-9 250.00, 250.01). 1 each 0  . blood glucose meter kit and supplies KIT Dispense based on patient and insurance preference. Use once daily to check glucose as needed (FOR ICD-9 250.00, 250.01). 1 each 0  . buPROPion (WELLBUTRIN SR) 150 MG 12 hr tablet Take 1 tablet (150 mg total) by mouth daily. 30 tablet 0  . CALCIUM PO Take 1 tablet by mouth daily.    . cephALEXin (KEFLEX) 250 MG capsule Take  by mouth at bedtime.    . Cholecalciferol (VITAMIN D3) 2000 units TABS Take by mouth. Take one tablet in the AM    . clopidogrel (PLAVIX) 75 MG tablet Take 1 tablet (75 mg total) by mouth daily. 90 tablet 3  . desmopressin (DDAVP) 0.2 MG tablet Start 1 tablet nightly for 7 days and can increase to a maximum of 3 tablets over 3 weeks to decrease nocturia    . desonide (DESOWEN) 0.05 % cream Apply topically as needed.     [name] econazole nitrate 1 % cream Apply topically as needed.     [name] estradiol (ESTRACE) 0.1 MG/GM vaginal cream Place 1 Applicatorful vaginally at bedtime.    . fluticasone (FLONASE) 50 MCG/ACT nasal spray PLACE 2 SPRAYS INTO BOTH NOSTRILS DAILY. 48 g 3  . losartan (COZAAR) 50 MG tablet Take 1 tablet (50 mg total) by mouth daily. 90 tablet 3  . metFORMIN (GLUCOPHAGE) 500 MG tablet Take 1 tablet (500 mg total) by mouth 2 (two) times daily with a meal. 180 tablet 3  . mirabegron ER (MYRBETRIQ) 50 MG TB24 tablet Take 50 mg by mouth daily.     . naproxen sodium (ALEVE) 220 MG tablet Take 220 mg by mouth as needed.    [name] omega-3 acid ethyl esters (LOVAZA) 1 g capsule Take 2 capsules (2 g total) by mouth 2 (two) times daily. 360 capsule 1  . omeprazole (PRILOSEC) 40 MG capsule     . oxyCODONE-acetaminophen (PERCOCET) 5-325 MG tablet Take 1 tablet by mouth every 8 (eight) hours as needed for severe pain. 15 tablet 0  . Probiotic Product (PROBIOTIC-10 PO) Take 1  capsule by mouth daily.    . PSYLLIUM PO Take by mouth.    . solifenacin (VESICARE) 5 MG tablet Take 10 mg by mouth daily.     . Vitamin D, Ergocalciferol, (DRISDOL) 1.25 MG (50000 UNIT) CAPS capsule Take 1 capsule (50,000 Units total) by mouth every 7 (seven) days. 4 capsule 0   No current facility-administered medications on file prior to visit.    Review of Systems:  As per HPI- otherwise negative.   Physical Examination: Vitals:   04/20/20 0912  BP: (!) 142/82  Pulse: 88  Resp: 18  Temp: 98.1 F (36.7 C)  SpO2: 98%   Vitals:   04/20/20 0912  Weight: 252 lb (114.3 kg)  Height: 5' 1"  (1.549 m)   Body mass index is 47.61 kg/m. Ideal Body Weight: Weight in (lb) to have BMI = 25: 132  GEN: no acute distress.  Obese, otherwise looks well HEENT: Atraumatic, Normocephalic.  Bilateral TM wnl, oropharynx normal.  PEERL,EOMI.   Ears and Nose: No external deformity. CV: RRR, No M/G/R. No JVD. No thrill. No extra heart sounds. PULM: CTA B, no wheezes, crackles, rhonchi. No retractions. No resp. distress. No accessory muscle use. ABD: S, NT, ND, +BS. No rebound. No HSM. EXTR: No c/c/e PSYCH: Normally interactive. Conversant.    Assessment and Plan: Physical exam  Essential hypertension - Plan: CBC, Comprehensive metabolic panel  Type 2 diabetes mellitus with other specified complication, without long-term current use of insulin (HCC) - Plan: Comprehensive metabolic panel, Hemoglobin A1c  Class 3 severe obesity with serious comorbidity and body mass index (BMI) of 45.0 to 49.9 in adult, unspecified obesity type (HCC)  Vitamin D deficiency - Plan: VITAMIN D 25 Hydroxy (Vit-D Deficiency, Fractures)  Mixed hyperlipidemia - Plan: Lipid panel  Estrogen deficiency - Plan: DG Bone Density  Here today for  physical exam Encouraged healthy diet and exercise routine Discussed health maintenance and immunizations Ordered bone density scan We will follow-up with patient further  pending her labs as above This visit occurred during the SARS-CoV-2 public health emergency.  Safety protocols were in place, including screening questions prior to the visit, additional usage of staff PPE, and extensive cleaning of exam room while observing appropriate contact time as indicated for disinfecting solutions.    Signed Lamar Blinks, MD  Received her labs, message to pt  Results for orders placed or performed in visit on 04/20/20  CBC  Result Value Ref Range   WBC 6.3 4.0 - 10.5 K/uL   RBC 4.39 3.87 - 5.11 Mil/uL   Platelets 269.0 150.0 - 400.0 K/uL   Hemoglobin 13.0 12.0 - 15.0 g/dL   HCT 39.2 36.0 - 46.0 %   MCV 89.4 78.0 - 100.0 fl   MCHC 33.2 30.0 - 36.0 g/dL   RDW 16.1 (H) 11.5 - 15.5 %  Comprehensive metabolic panel  Result Value Ref Range   Sodium 137 135 - 145 mEq/L   Potassium 4.3 3.5 - 5.1 mEq/L   Chloride 101 96 - 112 mEq/L   CO2 26 19 - 32 mEq/L   Glucose, Bld 103 (H) 70 - 99 mg/dL   BUN 15 6 - 23 mg/dL   Creatinine, Ser 0.53 0.40 - 1.20 mg/dL   Total Bilirubin 0.8 0.2 - 1.2 mg/dL   Alkaline Phosphatase 49 39 - 117 U/L   AST 21 0 - 37 U/L   ALT 15 0 - 35 U/L   Total Protein 7.0 6.0 - 8.3 g/dL   Albumin 4.4 3.5 - 5.2 g/dL   GFR 90.65 >60.00 mL/min   Calcium 9.5 8.4 - 10.5 mg/dL  Hemoglobin A1c  Result Value Ref Range   Hgb A1c MFr Bld 6.4 4.6 - 6.5 %  Lipid panel  Result Value Ref Range   Cholesterol 204 (H) 0 - 200 mg/dL   Triglycerides 207.0 (H) 0.0 - 149.0 mg/dL   HDL 51.00 >39.00 mg/dL   VLDL 41.4 (H) 0.0 - 40.0 mg/dL   Total CHOL/HDL Ratio 4    NonHDL 152.84   VITAMIN D 25 Hydroxy (Vit-D Deficiency, Fractures)  Result Value Ref Range   VITD 38.16 30.00 - 100.00 ng/mL  LDL cholesterol, direct  Result Value Ref Range   Direct LDL 122.0 mg/dL

## 2020-04-20 ENCOUNTER — Encounter: Payer: Self-pay | Admitting: Family Medicine

## 2020-04-20 ENCOUNTER — Other Ambulatory Visit (HOSPITAL_BASED_OUTPATIENT_CLINIC_OR_DEPARTMENT_OTHER): Payer: Self-pay | Admitting: Family Medicine

## 2020-04-20 ENCOUNTER — Other Ambulatory Visit: Payer: Self-pay

## 2020-04-20 ENCOUNTER — Ambulatory Visit: Payer: Medicare PPO | Admitting: Family Medicine

## 2020-04-20 VITALS — BP 140/70 | HR 88 | Temp 98.1°F | Resp 18 | Ht 61.0 in | Wt 252.0 lb

## 2020-04-20 DIAGNOSIS — E559 Vitamin D deficiency, unspecified: Secondary | ICD-10-CM

## 2020-04-20 DIAGNOSIS — E1169 Type 2 diabetes mellitus with other specified complication: Secondary | ICD-10-CM | POA: Diagnosis not present

## 2020-04-20 DIAGNOSIS — E2839 Other primary ovarian failure: Secondary | ICD-10-CM

## 2020-04-20 DIAGNOSIS — I10 Essential (primary) hypertension: Secondary | ICD-10-CM | POA: Diagnosis not present

## 2020-04-20 DIAGNOSIS — Z0000 Encounter for general adult medical examination without abnormal findings: Secondary | ICD-10-CM

## 2020-04-20 DIAGNOSIS — E782 Mixed hyperlipidemia: Secondary | ICD-10-CM

## 2020-04-20 DIAGNOSIS — Z6841 Body Mass Index (BMI) 40.0 and over, adult: Secondary | ICD-10-CM

## 2020-04-20 DIAGNOSIS — Z1231 Encounter for screening mammogram for malignant neoplasm of breast: Secondary | ICD-10-CM

## 2020-04-20 LAB — COMPREHENSIVE METABOLIC PANEL
ALT: 15 U/L (ref 0–35)
AST: 21 U/L (ref 0–37)
Albumin: 4.4 g/dL (ref 3.5–5.2)
Alkaline Phosphatase: 49 U/L (ref 39–117)
BUN: 15 mg/dL (ref 6–23)
CO2: 26 mEq/L (ref 19–32)
Calcium: 9.5 mg/dL (ref 8.4–10.5)
Chloride: 101 mEq/L (ref 96–112)
Creatinine, Ser: 0.53 mg/dL (ref 0.40–1.20)
GFR: 90.65 mL/min (ref >60.00)
Glucose, Bld: 103 mg/dL — ABNORMAL HIGH (ref 70–99)
Potassium: 4.3 mEq/L (ref 3.5–5.1)
Sodium: 137 mEq/L (ref 135–145)
Total Bilirubin: 0.8 mg/dL (ref 0.2–1.2)
Total Protein: 7 g/dL (ref 6.0–8.3)

## 2020-04-20 LAB — CBC
HCT: 39.2 % (ref 36.0–46.0)
Hemoglobin: 13 g/dL (ref 12.0–15.0)
MCHC: 33.2 g/dL (ref 30.0–36.0)
MCV: 89.4 fl (ref 78.0–100.0)
Platelets: 269 10*3/uL (ref 150.0–400.0)
RBC: 4.39 Mil/uL (ref 3.87–5.11)
RDW: 16.1 % — ABNORMAL HIGH (ref 11.5–15.5)
WBC: 6.3 10*3/uL (ref 4.0–10.5)

## 2020-04-20 LAB — LIPID PANEL
Cholesterol: 204 mg/dL — ABNORMAL HIGH (ref 0–200)
HDL: 51 mg/dL (ref >39.00)
NonHDL: 152.84
Total CHOL/HDL Ratio: 4
Triglycerides: 207 mg/dL — ABNORMAL HIGH (ref 0.0–149.0)
VLDL: 41.4 mg/dL — ABNORMAL HIGH (ref 0.0–40.0)

## 2020-04-20 LAB — HEMOGLOBIN A1C: Hgb A1c MFr Bld: 6.4 % (ref 4.6–6.5)

## 2020-04-20 LAB — LDL CHOLESTEROL, DIRECT: Direct LDL: 122 mg/dL

## 2020-04-20 LAB — VITAMIN D 25 HYDROXY (VIT D DEFICIENCY, FRACTURES): VITD: 38.16 ng/mL (ref 30.00–100.00)

## 2020-04-20 MED FILL — OMEPRAZOLE 40 MG CPDR: 40 | 30 days supply | Qty: 30 | Fill #8

## 2020-04-20 NOTE — Patient Instructions (Signed)
Great to see you again today- take care and I will be in touch with your labs asap  Assuming all is well please see me in 6 months Mammogram next month You can also schedule your bone density test-  You can call the [place] imaging desk at [phone]

## 2020-04-21 ENCOUNTER — Other Ambulatory Visit: Payer: Self-pay | Admitting: Family Medicine

## 2020-04-21 MED ORDER — SIMVASTATIN 20 MG PO TABS: 20.0000 mg | ORAL_TABLET | Freq: Every day | ORAL | 3 refills | Status: DC

## 2020-04-21 MED FILL — SIMVASTATIN 20 MG TABLET: 20 | 90 days supply | Qty: 90 | Fill #0

## 2020-04-23 ENCOUNTER — Ambulatory Visit (INDEPENDENT_AMBULATORY_CARE_PROVIDER_SITE_OTHER): Payer: Medicare PPO | Admitting: Family Medicine

## 2020-05-06 ENCOUNTER — Other Ambulatory Visit (HOSPITAL_BASED_OUTPATIENT_CLINIC_OR_DEPARTMENT_OTHER): Payer: Self-pay | Admitting: Orthopedic Surgery

## 2020-05-06 DIAGNOSIS — M79672 Pain in left foot: Secondary | ICD-10-CM | POA: Diagnosis not present

## 2020-05-06 DIAGNOSIS — M79671 Pain in right foot: Secondary | ICD-10-CM | POA: Diagnosis not present

## 2020-05-12 DIAGNOSIS — R682 Dry mouth, unspecified: Secondary | ICD-10-CM | POA: Insufficient documentation

## 2020-05-12 DIAGNOSIS — R233 Spontaneous ecchymoses: Secondary | ICD-10-CM | POA: Insufficient documentation

## 2020-05-12 DIAGNOSIS — M549 Dorsalgia, unspecified: Secondary | ICD-10-CM | POA: Insufficient documentation

## 2020-05-12 DIAGNOSIS — J3489 Other specified disorders of nose and nasal sinuses: Secondary | ICD-10-CM | POA: Insufficient documentation

## 2020-05-12 DIAGNOSIS — R0602 Shortness of breath: Secondary | ICD-10-CM | POA: Insufficient documentation

## 2020-05-12 DIAGNOSIS — H9190 Unspecified hearing loss, unspecified ear: Secondary | ICD-10-CM | POA: Insufficient documentation

## 2020-05-12 DIAGNOSIS — M7989 Other specified soft tissue disorders: Secondary | ICD-10-CM | POA: Insufficient documentation

## 2020-05-12 DIAGNOSIS — M25569 Pain in unspecified knee: Secondary | ICD-10-CM | POA: Insufficient documentation

## 2020-05-12 DIAGNOSIS — R238 Other skin changes: Secondary | ICD-10-CM | POA: Insufficient documentation

## 2020-05-12 DIAGNOSIS — M2550 Pain in unspecified joint: Secondary | ICD-10-CM | POA: Insufficient documentation

## 2020-05-12 DIAGNOSIS — R252 Cramp and spasm: Secondary | ICD-10-CM | POA: Insufficient documentation

## 2020-05-12 DIAGNOSIS — H5789 Other specified disorders of eye and adnexa: Secondary | ICD-10-CM | POA: Insufficient documentation

## 2020-05-13 ENCOUNTER — Ambulatory Visit (INDEPENDENT_AMBULATORY_CARE_PROVIDER_SITE_OTHER): Payer: Medicare PPO | Admitting: Family Medicine

## 2020-05-13 ENCOUNTER — Encounter (INDEPENDENT_AMBULATORY_CARE_PROVIDER_SITE_OTHER): Payer: Self-pay | Admitting: Family Medicine

## 2020-05-13 ENCOUNTER — Other Ambulatory Visit: Payer: Self-pay

## 2020-05-13 ENCOUNTER — Other Ambulatory Visit (INDEPENDENT_AMBULATORY_CARE_PROVIDER_SITE_OTHER): Payer: Self-pay | Admitting: Family Medicine

## 2020-05-13 VITALS — BP 155/67 | HR 74 | Temp 97.4°F | Ht 61.0 in | Wt 247.0 lb

## 2020-05-13 DIAGNOSIS — E559 Vitamin D deficiency, unspecified: Secondary | ICD-10-CM

## 2020-05-13 DIAGNOSIS — E1169 Type 2 diabetes mellitus with other specified complication: Secondary | ICD-10-CM

## 2020-05-13 DIAGNOSIS — F3289 Other specified depressive episodes: Secondary | ICD-10-CM

## 2020-05-13 DIAGNOSIS — I10 Essential (primary) hypertension: Secondary | ICD-10-CM

## 2020-05-13 DIAGNOSIS — Z6841 Body Mass Index (BMI) 40.0 and over, adult: Secondary | ICD-10-CM

## 2020-05-13 MED ORDER — VITAMIN D (ERGOCALCIFEROL) 1.25 MG (50000 UNIT) PO CAPS: 50000.0000 [IU] | ORAL_CAPSULE | ORAL | 0 refills | Status: DC

## 2020-05-13 MED ORDER — BUPROPION HCL ER (SR) 150 MG PO TB12: 150.0000 mg | ORAL_TABLET | Freq: Every day | ORAL | 0 refills | Status: DC

## 2020-05-13 MED FILL — BUPROPION HCL ER (SR) 150 M: 150 | 30 days supply | Qty: 30 | Fill #0

## 2020-05-13 MED FILL — VIT D2 1.25 MG (50,000 UNIT: 1.25 MG | 28 days supply | Qty: 4 | Fill #0

## 2020-05-13 NOTE — Telephone Encounter (Signed)
DR Beasley 

## 2020-05-15 MED FILL — METFORMIN HCL 500 MG TABS: 500 | 90 days supply | Qty: 180 | Fill #1

## 2020-05-15 MED FILL — CLOPIDOGREL 75 MG TABLET: 75 | 90 days supply | Qty: 90 | Fill #3

## 2020-05-15 MED FILL — SOLIFENACIN SUCCINATE 10 MG: 10 | 30 days supply | Qty: 30 | Fill #1

## 2020-05-15 MED FILL — OMEGA-3-ACID ETHYL ESTERS 1: 1 | 90 days supply | Qty: 360 | Fill #1

## 2020-05-15 MED FILL — LOSARTAN POTASSIUM 50 MG TA: 50 | 90 days supply | Qty: 90 | Fill #1

## 2020-05-15 MED FILL — OMEPRAZOLE 40 MG CPDR: 40 | 30 days supply | Qty: 30 | Fill #9

## 2020-05-18 ENCOUNTER — Ambulatory Visit (HOSPITAL_BASED_OUTPATIENT_CLINIC_OR_DEPARTMENT_OTHER)
Admission: RE | Admit: 2020-05-18 | Discharge: 2020-05-18 | Disposition: A | Discharge status: Home / Self Care | Payer: Medicare PPO | Source: Ambulatory Visit | Attending: Family Medicine | Admitting: Family Medicine

## 2020-05-18 ENCOUNTER — Encounter (HOSPITAL_BASED_OUTPATIENT_CLINIC_OR_DEPARTMENT_OTHER): Payer: Self-pay

## 2020-05-18 ENCOUNTER — Other Ambulatory Visit: Payer: Self-pay

## 2020-05-18 ENCOUNTER — Encounter: Payer: Self-pay | Admitting: Family Medicine

## 2020-05-18 DIAGNOSIS — Z1231 Encounter for screening mammogram for malignant neoplasm of breast: Secondary | ICD-10-CM | POA: Diagnosis not present

## 2020-05-18 DIAGNOSIS — E2839 Other primary ovarian failure: Secondary | ICD-10-CM | POA: Diagnosis not present

## 2020-05-18 DIAGNOSIS — Z780 Asymptomatic menopausal state: Secondary | ICD-10-CM | POA: Diagnosis not present

## 2020-05-18 DIAGNOSIS — M85852 Other specified disorders of bone density and structure, left thigh: Secondary | ICD-10-CM | POA: Diagnosis not present

## 2020-05-19 NOTE — Progress Notes (Signed)
Chief Complaint:   OBESITY Jasmin [name] is here to discuss her progress with her obesity treatment plan along with follow-up of her obesity related diagnoses. Jasmin [name] is on the Category 2 Plan and states she is following her eating plan approximately 30% of the time. Jasmin [name] states she is walking and doing therapy exercises for 30 minutes 7 times per week.  Today's visit was #: 23 Starting weight: 248 lbs Starting date: 06/21/2017 Today's weight: 247 lbs Today's date: 05/13/2020 Total lbs lost to date: 1 Total lbs lost since last in-office visit: 0  Interim History: Jasmin [name]'s last visit was approximately 10 weeks ago. She has done some traveling and celebration eating, and she has gained some of her weight back. She is ready to get back on track with her weight loss and would like to try to be more structured again.  Subjective:   1. Essential hypertension [name]'s blood pressure is elevated today. Her medications were recently changed by her primary care physician. She is ready to work on weight loss again which should help.  2. Type 2 diabetes mellitus with other specified complication, without long-term current use of insulin (HCC) [name]'s fasting BGs mostly range between 110-120 over the last month. She denies signs of hypoglycemia.  3. Vitamin D deficiency Jasmin [name] is stable on Vit D, and she requests a refill today.  4. Other depression, emotional eating Jasmin [name] is doing well on her medications. She is working on decreasing emotional eating behaviors. Her blood pressure is elevated today, but it is normally below 272 systolic (she is >53 years old).  Assessment/Plan:   1. Essential hypertension Jasmin [name] is to restart diet and exercise to improve blood pressure control. We will recheck her blood pressure in 2 weeks.  2. Type 2 diabetes mellitus with other specified complication, without long-term current use of insulin (HCC) Good blood sugar control is important to decrease  the likelihood of diabetic complications such as nephropathy, neuropathy, limb loss, blindness, coronary artery disease, and death. Intensive lifestyle modification including diet, exercise and weight loss are the first line of treatment for diabetes. Jasmin [name] is to restart diet exercise, and try to check her 2 hour post prandial BGs 1-2 times per week as well as to get a better picture of any BGs excursions.  3. Vitamin D deficiency Low Vitamin D level contributes to fatigue and are associated with obesity, breast, and colon cancer. We will refill prescription Vitamin D for 1 month. [name] will follow-up for routine testing of Vitamin D, at least 2-3 times per year to avoid over-replacement.  - Vitamin D, Ergocalciferol, (DRISDOL) 1.25 MG (50000 UNIT) CAPS capsule; Take 1 capsule (50,000 Units total) by mouth every 7 (seven) days.  Dispense: 4 capsule; Refill: 0  4. Other depression, emotional eating Behavior modification techniques were discussed today to help Jasmin [name] deal with her emotional/non-hunger eating behaviors. We will refill Wellbutrin SR for 1 month. Orders and follow up as documented in patient record.   - buPROPion (WELLBUTRIN SR) 150 MG 12 hr tablet; Take 1 tablet (150 mg total) by mouth daily.  Dispense: 30 tablet; Refill: 0  5. Obesity with current BMI 46.7 Jasmin [name] is currently in the action stage of change. As such, her goal is to continue with weight loss efforts. She has agreed to the Category 2 Plan and keeping a food journal and adhering to recommended goals of 400-550 calories and 35 grams of protein at supper dialy.   Exercise goals: As is.  Behavioral modification strategies:  increasing lean protein intake, meal planning and cooking strategies and keeping a strict food journal.  Jasmin [name] has agreed to follow-up with our clinic in 2 weeks. She was informed of the importance of frequent follow-up visits to maximize her success with intensive lifestyle modifications for her  multiple health conditions.   Objective:   Blood pressure (!) 155/67, pulse 74, temperature (!) 97.4 F (36.3 C), height 5\' 1"  (1.549 m), weight 247 lb (112 kg), SpO2 98 %. Body mass index is 46.67 kg/m.  General: Cooperative, alert, well developed, in no acute distress. HEENT: Conjunctivae and lids unremarkable. Cardiovascular: Regular rhythm.  Lungs: Normal work of breathing. Neurologic: No focal deficits.   Lab Results  Component Value Date   CREATININE 0.53 04/20/2020   BUN 15 04/20/2020   NA 137 04/20/2020   K 4.3 04/20/2020   CL 101 04/20/2020   CO2 26 04/20/2020   Lab Results  Component Value Date   ALT 15 04/20/2020   AST 21 04/20/2020   ALKPHOS 49 04/20/2020   BILITOT 0.8 04/20/2020   Lab Results  Component Value Date   HGBA1C 6.4 04/20/2020   HGBA1C 6.1 (H) 10/16/2019   HGBA1C 6.1 (H) 07/01/2019   HGBA1C 6.6 (H) 12/31/2018   HGBA1C 6.3 08/29/2018   Lab Results  Component Value Date   INSULIN 21.1 07/01/2019   INSULIN 24.2 09/28/2017   INSULIN 17.6 06/21/2017   Lab Results  Component Value Date   TSH 1.860 07/01/2019   Lab Results  Component Value Date   CHOL 204 (H) 04/20/2020   HDL 51.00 04/20/2020   LDLCALC 117 (H) 10/16/2019   LDLDIRECT 122.0 04/20/2020   TRIG 207.0 (H) 04/20/2020   CHOLHDL 4 04/20/2020   Lab Results  Component Value Date   WBC 6.3 04/20/2020   HGB 13.0 04/20/2020   HCT 39.2 04/20/2020   MCV 89.4 04/20/2020   PLT 269.0 04/20/2020   No results found for: IRON, TIBC, FERRITIN  Obesity Behavioral Intervention:   Approximately 15 minutes were spent on the discussion below.  ASK: We discussed the diagnosis of obesity with Jasmin [name] today and Jasmin [name] agreed to give Jasmin [name] permission to discuss obesity behavioral modification therapy today.  ASSESS: Safire has the diagnosis of obesity and her BMI today is 46.69. Jasmin [name] is in the action stage of change.   ADVISE: Jasmin [name] was educated on the multiple health risks of  obesity as well as the benefit of weight loss to improve her health. She was advised of the need for long term treatment and the importance of lifestyle modifications to improve her current health and to decrease her risk of future health problems.  AGREE: Multiple dietary modification options and treatment options were discussed and Jasmin [name] agreed to follow the recommendations documented in the above note.  ARRANGE: Blessed was educated on the importance of frequent visits to treat obesity as outlined per CMS and USPSTF guidelines and agreed to schedule her next follow up appointment today.  Attestation Statements:   Reviewed by clinician on day of visit: allergies, medications, problem list, medical history, surgical history, family history, social history, and previous encounter notes.   I, Trixie Dredge, am acting as transcriptionist for Dennard Nip, MD.  I have reviewed the above documentation for accuracy and completeness, and I agree with the above. -  Dennard Nip, MD

## 2020-05-20 NOTE — Progress Notes (Signed)
Cardiology Office Note:    Date:  05/21/2020   ID:  Jasmin [name], DOB [birth year], MRN [id number]  PCP:  Darreld Mclean, MD  Cardiologist:  Shirlee More, MD    Referring MD: Darreld Mclean, MD    ASSESSMENT:    1. Nonrheumatic aortic valve stenosis   2. Essential hypertension   3. Mixed hyperlipidemia    PLAN:    In order of problems listed above:  1. Stable aortic stenosis recheck echocardiogram in 1 year this June if stable seen the office 1 year later typically progression is slow and she is asymptomatic. 2. Continue current treatment 3. Continue statin indicated with aortic stenosis 4. I think she is an appropriate candidate for low risk for planned neuro stimulatory therapy for urinary incontinence   Next appointment: 1 year   Medication Adjustments/Labs and Tests Ordered: Current medicines are reviewed at length with the patient today.  Concerns regarding medicines are outlined above.  No orders of the defined types were placed in this encounter.  No orders of the defined types were placed in this encounter.   Chief Complaint  Patient presents with  . Follow-up  . Aortic Stenosis    History of Present Illness:    Jasmin [name] is a 75 y.o. female with a hx of calcific aortic stenosis hypertension hyperlipidemia last seen 05/21/2019.  Compliance with diet, lifestyle and medications: Yes  She has had no cardiovascular symptoms of edema and chest pain shortness of breath palpitation or syncope She is engaging in a supervised weight loss program She is considering neuromodulation for urinary incontinence  Echocardiogram performed 07/29/2019 showed mild to  moderate aortic stenosis peak and mean gradients 36 and 20 mmHg VTI ratio 0.52 valve area of 1.35 and a normal stroke-volume index. Past Medical History:  Diagnosis Date  . Acid reflux disease 03/09/2015  . Aortic stenosis 03/09/2015  . AR (allergic rhinitis) 03/09/2015  . Arthritis   . Back  pain   . Controlled type 2 diabetes mellitus without complication, without long-term current use of insulin ([place]) 08/08/2016  . Cough   . 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 Decreased hearing   . Diabetes mellitus without complication ([place])   . Dry mouth   . Easy bruising   . Essential hypertension 08/08/2016  . Heart murmur   . 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 History of CVA (cerebrovascular accident) 08/08/2016   Seen on MRI from 08/2015  . Hyperlipidemia   . Hypertension   . 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 IBS (irritable bowel syndrome) 07/16/2015  . Insomnia 07/16/2015  . Joint pain   . Knee pain   . Left ventricular hypertrophy 07/16/2015  . Leg cramping    right leg  . Mixed hyperlipidemia 08/08/2016  . Morbid obesity ([place]) 11/10/2015  . Nasal discharge   . Nuclear sclerotic cataract of left eye 07/07/2016  . OAB (overactive bladder) 01/20/2016  . OSA (obstructive sleep apnea) 03/09/2015  . Osteoporosis 03/09/2015  . 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 Pulmonary hypertension ([place]) 07/16/2015  . Red eyes   . Right kidney stone   . Shortness of breath on exertion   . Sleep apnea   . Stroke ([place])   . Swelling of both lower extremities   . UTI (urinary tract infection)   . Vitamin D deficiency 03/09/2015    Past Surgical History:  Procedure Laterality Date  . ABDOMINAL HYSTERECTOMY  09/14/2016  . CATARACT EXTRACTION Left   .  CATARACT EXTRACTION Left 06/2016  . COLPORRHAPHY N/A 09/14/2016   [facility]  . CYSTOCELE REPAIR    . FRACTURE SURGERY Left 1953   L arm  . FRACTURE SURGERY Left 1985   L ankle  . HERNIA REPAIR  2004  . JOINT REPLACEMENT Right 2012   R TKA  . JOINT REPLACEMENT Left 2017  . KIDNEY STONE SURGERY    . LAPAROSCOPIC ASSISTED VAGINAL HYSTERECTOMY N/A 09/14/2016   [facility]  . R foot/ankle Right 2013 and 2014   plate and screws lateral foot and tendon removal medial foot in 2013, revision in 2014  . RECTOCELE REPAIR    .  URETERAL STENT PLACEMENT    . URETEROSCOPY      Current Medications: Current Meds  Medication Sig  . amoxicillin (AMOXIL) 500 MG capsule Take prior to dentist procedure  . blood glucose meter kit and supplies KIT Dispense based on patient and insurance preference. Use once daily to monitor glucose as needed (FOR ICD-9 250.00, 250.01).  [name] buPROPion (WELLBUTRIN SR) 150 MG 12 hr tablet Take 1 tablet (150 mg total) by mouth daily.  [name] CALCIUM PO Take 1 tablet by mouth daily.  . cephALEXin (KEFLEX) 250 MG capsule Take by mouth at bedtime.  . Cholecalciferol (VITAMIN D3) 2000 units TABS Take by mouth. Take one tablet in the AM  . clopidogrel (PLAVIX) 75 MG tablet Take 1 tablet (75 mg total) by mouth daily.  [name] desmopressin (DDAVP) 0.2 MG tablet Start 1 tablet nightly for 7 days and can increase to a maximum of 3 tablets over 3 weeks to decrease nocturia  . desonide (DESOWEN) 0.05 % cream Apply topically as needed.   [name] econazole nitrate 1 % cream Apply topically as needed.   [name] estradiol (ESTRACE) 0.1 MG/GM vaginal cream Place 1 Applicatorful vaginally daily. AS NEEDED  . fluticasone (FLONASE) 50 MCG/ACT nasal spray PLACE 2 SPRAYS INTO BOTH NOSTRILS DAILY.  [name] losartan (COZAAR) 50 MG tablet Take 1 tablet (50 mg total) by mouth daily.  . meloxicam (MOBIC) 15 MG tablet Take 15 mg by mouth in the morning.  . metFORMIN (GLUCOPHAGE) 500 MG tablet Take 1 tablet (500 mg total) by mouth 2 (two) times daily with a meal.  . Miconazole Nitrate (ZEASORB-AF EX) Apply topically daily as needed.  . mirabegron ER (MYRBETRIQ) 50 MG TB24 tablet Take 50 mg by mouth daily.   . naproxen sodium (ALEVE) 220 MG tablet Take 220 mg by mouth as needed.  [name] omega-3 acid ethyl esters (LOVAZA) 1 g capsule Take 2 capsules (2 g total) by mouth 2 (two) times daily.  [name] omeprazole (PRILOSEC) 40 MG capsule   . oxyCODONE-acetaminophen (PERCOCET) 5-325 MG tablet Take 1 tablet by mouth every 8 (eight) hours as needed for severe pain.  .  Probiotic Product (PROBIOTIC-10 PO) Take 1 capsule by mouth daily.  . PSYLLIUM PO Take by mouth.  . simvastatin (ZOCOR) 20 MG tablet Take 1 tablet (20 mg total) by mouth at bedtime.  . solifenacin (VESICARE) 5 MG tablet Take 10 mg by mouth daily.   . Vitamin D, Ergocalciferol, (DRISDOL) 1.25 MG (50000 UNIT) CAPS capsule Take 1 capsule (50,000 Units total) by mouth every 7 (seven) days.     Allergies:   Latex, Ciprofloxacin, Metronidazole, and Adhesive [tape]   Social History   Socioeconomic History  . Marital status: Married    Spouse name: Jasmin [name]  . Number of children: 1  . Years of education: Not on file  . Highest  education level: Not on file  Occupational History  . Occupation: Retired Pharmacist, hospital  Tobacco Use  . Smoking status: Never Smoker  . Smokeless tobacco: Never Used  Vaping Use  . Vaping Use: Never used  Substance and Sexual Activity  . Alcohol use: Yes    Alcohol/week: 2.0 - 3.0 standard drinks    Types: 2 - 3 Glasses of wine per week    Comment: couple of glasses per week  . Drug use: No  . Sexual activity: Not Currently  Other Topics Concern  . Not on file  Social History Narrative  . Not on file   Social Determinants of Health   Financial Resource Strain: Not on file  Food Insecurity: Not on file  Transportation Needs: Not on file  Physical Activity: Not on file  Stress: Not on file  Social Connections: Not on file     Family History: The patient's family history includes Heart disease in her father and mother; Hyperlipidemia in her mother; Hypertension in her father and mother; Stroke in her mother; Thyroid disease in her mother. ROS:   Please see the history of present illness.    All other systems reviewed and are negative.  EKGs/Labs/Other Studies Reviewed:    The following studies were reviewed today:  EKG:  EKG ordered today and personally reviewed.  The ekg ordered today demonstrates sinus rhythm and is normal  Recent  Labs: 07/01/2019: TSH 1.860 04/20/2020: ALT 15; BUN 15; Creatinine, Ser 0.53; Hemoglobin 13.0; Platelets 269.0; Potassium 4.3; Sodium 137  Recent Lipid Panel    Component Value Date/Time   CHOL 204 (H) 04/20/2020 0946   CHOL 193 06/19/2019 0859   TRIG 207.0 (H) 04/20/2020 0946   HDL 51.00 04/20/2020 0946   HDL 52 06/19/2019 0859   CHOLHDL 4 04/20/2020 0946   VLDL 41.4 (H) 04/20/2020 0946   LDLCALC 117 (H) 10/16/2019 1025   LDLDIRECT 122.0 04/20/2020 0946    Physical Exam:    VS:  BP (!) 146/64   Pulse 78   Ht 5' 1"  (1.549 m)   Wt 248 lb (112.5 kg)   SpO2 99%   BMI 46.86 kg/m     Wt Readings from Last 3 Encounters:  05/21/20 248 lb (112.5 kg)  05/13/20 247 lb (112 kg)  04/20/20 252 lb (114.3 kg)     GEN: Obese well nourished, well developed in no acute distress HEENT: Normal NECK: No JVD; No carotid bruits LYMPHATICS: No lymphadenopathy CARDIAC: 2/6 midsystolic harsh ejection murmur S2 is split no AR does not radiate to the carotids RRR, no murmurs, rubs, gallops RESPIRATORY:  Clear to auscultation without rales, wheezing or rhonchi  ABDOMEN: Soft, non-tender, non-distended MUSCULOSKELETAL:  No edema; No deformity  SKIN: Warm and dry NEUROLOGIC:  Alert and oriented x 3 PSYCHIATRIC:  Normal affect    Signed, Shirlee More, MD  05/21/2020 8:37 AM    [place]

## 2020-05-21 ENCOUNTER — Encounter: Payer: Self-pay | Admitting: Cardiology

## 2020-05-21 ENCOUNTER — Other Ambulatory Visit: Payer: Self-pay

## 2020-05-21 ENCOUNTER — Ambulatory Visit: Payer: Medicare PPO | Admitting: Cardiology

## 2020-05-21 VITALS — BP 146/64 | HR 78 | Ht 61.0 in | Wt 248.0 lb

## 2020-05-21 DIAGNOSIS — E782 Mixed hyperlipidemia: Secondary | ICD-10-CM

## 2020-05-21 DIAGNOSIS — I10 Essential (primary) hypertension: Secondary | ICD-10-CM

## 2020-05-21 DIAGNOSIS — I350 Nonrheumatic aortic (valve) stenosis: Secondary | ICD-10-CM

## 2020-05-21 NOTE — Patient Instructions (Signed)
Medication Instructions:  Your physician recommends that you continue on your current medications as directed. Please refer to the Current Medication list given to you today.  *If you need a refill on your cardiac medications before your next appointment, please call your pharmacy*   Lab Work: None If you have labs (blood work) drawn today and your tests are completely normal, you will receive your results only by: MyChart Message (if you have MyChart) OR A paper copy in the mail If you have any lab test that is abnormal or we need to change your treatment, we will call you to review the results.   Testing/Procedures: Your physician has requested that you have an echocardiogram. Echocardiography is a painless test that uses sound waves to create images of your heart. It provides your doctor with information about the size and shape of your heart and how well your heart's chambers and valves are working. This procedure takes approximately one hour. There are no restrictions for this procedure.    Follow-Up: At [facility], you and your health needs are our priority.  As part of our continuing mission to provide you with exceptional heart care, we have created designated Provider Care Teams.  These Care Teams include your primary Cardiologist (physician) and Advanced Practice Providers (APPs -  Physician Assistants and Nurse Practitioners) who all work together to provide you with the care you need, when you need it.  We recommend signing up for the patient portal called "MyChart".  Sign up information is provided on this After Visit Summary.  MyChart is used to connect with patients for Virtual Visits (Telemedicine).  Patients are able to view lab/test results, encounter notes, upcoming appointments, etc.  Non-urgent messages can be sent to your provider as well.   To learn more about what you can do with MyChart, go to [email or web address].    Your next appointment:   1 year(s)  The  format for your next appointment:   In Person  Provider:   Brian Munley, MD   Other Instructions   

## 2020-05-22 DIAGNOSIS — R3915 Urgency of urination: Secondary | ICD-10-CM | POA: Diagnosis not present

## 2020-05-22 DIAGNOSIS — R350 Frequency of micturition: Secondary | ICD-10-CM | POA: Diagnosis not present

## 2020-05-22 DIAGNOSIS — R351 Nocturia: Secondary | ICD-10-CM | POA: Diagnosis not present

## 2020-05-28 ENCOUNTER — Other Ambulatory Visit (HOSPITAL_BASED_OUTPATIENT_CLINIC_OR_DEPARTMENT_OTHER): Payer: Self-pay

## 2020-05-28 ENCOUNTER — Other Ambulatory Visit: Payer: Self-pay

## 2020-05-28 MED FILL — Meloxicam Tab 15 MG: ORAL | 30 days supply | Qty: 30 | Fill #0 | Status: CN

## 2020-05-29 ENCOUNTER — Other Ambulatory Visit (HOSPITAL_BASED_OUTPATIENT_CLINIC_OR_DEPARTMENT_OTHER): Payer: Self-pay

## 2020-05-29 MED ORDER — DESMOPRESSIN ACETATE 0.2 MG PO TABS
ORAL_TABLET | ORAL | 0 refills | Status: DC
  Filled 2020-05-29: qty 90, 30d supply, fill #0

## 2020-06-04 ENCOUNTER — Other Ambulatory Visit (HOSPITAL_BASED_OUTPATIENT_CLINIC_OR_DEPARTMENT_OTHER): Payer: Self-pay

## 2020-06-04 ENCOUNTER — Other Ambulatory Visit: Payer: Self-pay | Admitting: Family Medicine

## 2020-06-04 ENCOUNTER — Ambulatory Visit (INDEPENDENT_AMBULATORY_CARE_PROVIDER_SITE_OTHER): Payer: Medicare PPO | Admitting: Physician Assistant

## 2020-06-04 ENCOUNTER — Other Ambulatory Visit: Payer: Self-pay

## 2020-06-04 DIAGNOSIS — J3089 Other allergic rhinitis: Secondary | ICD-10-CM

## 2020-06-04 MED ORDER — SOLIFENACIN SUCCINATE 10 MG PO TABS
10.0000 mg | ORAL_TABLET | Freq: Every day | ORAL | 1 refills | Status: DC
  Filled 2020-06-04 – 2020-06-08 (×3): qty 30, 30d supply, fill #0
  Filled 2020-07-13: qty 30, 30d supply, fill #1

## 2020-06-04 MED ORDER — MYRBETRIQ 50 MG PO TB24
50.0000 mg | ORAL_TABLET | Freq: Every day | ORAL | 3 refills | Status: DC
  Filled 2020-06-04 (×2): qty 90, 90d supply, fill #0
  Filled 2020-09-04: qty 90, 90d supply, fill #1
  Filled 2020-12-07: qty 90, 90d supply, fill #2
  Filled 2021-03-08: qty 90, 90d supply, fill #3

## 2020-06-04 MED ORDER — FLUTICASONE PROPIONATE 50 MCG/ACT NA SUSP
2.0000 | Freq: Every day | NASAL | 3 refills | Status: DC
  Filled 2020-06-04: qty 48, 90d supply, fill #0
  Filled 2021-01-15: qty 48, 90d supply, fill #1

## 2020-06-04 MED FILL — Meloxicam Tab 15 MG: ORAL | 30 days supply | Qty: 30 | Fill #0 | Status: AC

## 2020-06-05 ENCOUNTER — Other Ambulatory Visit (HOSPITAL_BASED_OUTPATIENT_CLINIC_OR_DEPARTMENT_OTHER): Payer: Self-pay

## 2020-06-05 DIAGNOSIS — E871 Hypo-osmolality and hyponatremia: Secondary | ICD-10-CM | POA: Diagnosis not present

## 2020-06-05 MED ORDER — DESONIDE 0.05 % EX CREA
TOPICAL_CREAM | CUTANEOUS | 0 refills | Status: DC
  Filled 2020-06-05: qty 60, 30d supply, fill #0

## 2020-06-08 ENCOUNTER — Other Ambulatory Visit: Payer: Self-pay

## 2020-06-08 ENCOUNTER — Other Ambulatory Visit (HOSPITAL_BASED_OUTPATIENT_CLINIC_OR_DEPARTMENT_OTHER): Payer: Self-pay

## 2020-06-09 ENCOUNTER — Other Ambulatory Visit (HOSPITAL_BASED_OUTPATIENT_CLINIC_OR_DEPARTMENT_OTHER): Payer: Self-pay

## 2020-06-15 ENCOUNTER — Other Ambulatory Visit (HOSPITAL_BASED_OUTPATIENT_CLINIC_OR_DEPARTMENT_OTHER): Payer: Self-pay

## 2020-06-15 MED FILL — Omeprazole Cap Delayed Release 40 MG: ORAL | 30 days supply | Qty: 30 | Fill #0 | Status: AC

## 2020-06-17 ENCOUNTER — Other Ambulatory Visit (HOSPITAL_BASED_OUTPATIENT_CLINIC_OR_DEPARTMENT_OTHER): Payer: Self-pay

## 2020-06-17 ENCOUNTER — Ambulatory Visit (INDEPENDENT_AMBULATORY_CARE_PROVIDER_SITE_OTHER): Payer: Medicare PPO | Admitting: Family Medicine

## 2020-06-22 ENCOUNTER — Other Ambulatory Visit: Payer: Self-pay

## 2020-06-22 ENCOUNTER — Other Ambulatory Visit (HOSPITAL_BASED_OUTPATIENT_CLINIC_OR_DEPARTMENT_OTHER): Payer: Self-pay

## 2020-06-22 DIAGNOSIS — M21962 Unspecified acquired deformity of left lower leg: Secondary | ICD-10-CM | POA: Diagnosis not present

## 2020-06-22 DIAGNOSIS — M2142 Flat foot [pes planus] (acquired), left foot: Secondary | ICD-10-CM | POA: Diagnosis not present

## 2020-06-22 MED FILL — Meloxicam Tab 15 MG: ORAL | 30 days supply | Qty: 30 | Fill #1 | Status: CN

## 2020-06-23 ENCOUNTER — Other Ambulatory Visit (HOSPITAL_BASED_OUTPATIENT_CLINIC_OR_DEPARTMENT_OTHER): Payer: Self-pay

## 2020-06-24 ENCOUNTER — Other Ambulatory Visit (HOSPITAL_BASED_OUTPATIENT_CLINIC_OR_DEPARTMENT_OTHER): Payer: Self-pay

## 2020-06-24 MED ORDER — CEPHALEXIN 250 MG PO CAPS
ORAL_CAPSULE | ORAL | 1 refills | Status: DC
  Filled 2020-06-24: qty 90, 90d supply, fill #0
  Filled 2020-09-28: qty 90, 90d supply, fill #1

## 2020-06-29 ENCOUNTER — Other Ambulatory Visit (HOSPITAL_BASED_OUTPATIENT_CLINIC_OR_DEPARTMENT_OTHER): Payer: Self-pay

## 2020-06-29 MED ORDER — DESMOPRESSIN ACETATE 0.2 MG PO TABS
ORAL_TABLET | ORAL | 3 refills | Status: DC
  Filled 2020-06-29: qty 90, 30d supply, fill #0
  Filled 2020-07-31: qty 90, 30d supply, fill #1
  Filled 2020-08-25: qty 90, 30d supply, fill #2
  Filled 2020-09-28: qty 90, 30d supply, fill #3

## 2020-06-29 MED FILL — Meloxicam Tab 15 MG: ORAL | 30 days supply | Qty: 30 | Fill #1 | Status: AC

## 2020-06-30 ENCOUNTER — Other Ambulatory Visit (HOSPITAL_BASED_OUTPATIENT_CLINIC_OR_DEPARTMENT_OTHER): Payer: Self-pay

## 2020-07-06 ENCOUNTER — Ambulatory Visit (HOSPITAL_BASED_OUTPATIENT_CLINIC_OR_DEPARTMENT_OTHER)
Admission: RE | Admit: 2020-07-06 | Discharge: 2020-07-06 | Disposition: A | Discharge status: Home / Self Care | Payer: Medicare PPO | Source: Ambulatory Visit | Attending: Cardiology | Admitting: Cardiology

## 2020-07-06 ENCOUNTER — Other Ambulatory Visit: Payer: Self-pay

## 2020-07-06 DIAGNOSIS — I350 Nonrheumatic aortic (valve) stenosis: Secondary | ICD-10-CM | POA: Diagnosis not present

## 2020-07-06 LAB — ECHOCARDIOGRAM COMPLETE
AR max vel: 1.26 cm2
AV Area VTI: 1.18 cm2
AV Area mean vel: 1.11 cm2
AV Mean grad: 18.3 mmHg
AV Peak grad: 27.8 mmHg
Ao pk vel: 2.64 m/s
Area-P 1/2: 2.22 cm2
P 1/2 time: 440 msec
S' Lateral: 3.31 cm

## 2020-07-06 NOTE — Progress Notes (Signed)
  Echocardiogram 2D Echocardiogram has been performed.  Jasmin [name] 07/06/2020, 9:04 AM

## 2020-07-07 ENCOUNTER — Ambulatory Visit (INDEPENDENT_AMBULATORY_CARE_PROVIDER_SITE_OTHER): Payer: Medicare PPO | Admitting: Family Medicine

## 2020-07-07 ENCOUNTER — Telehealth: Payer: Self-pay

## 2020-07-07 NOTE — Telephone Encounter (Signed)
Spoke with patient regarding results and recommendation.  Patient verbalizes understanding and is agreeable to plan of care. Advised patient to call back with any issues or concerns.  

## 2020-07-13 ENCOUNTER — Other Ambulatory Visit (HOSPITAL_BASED_OUTPATIENT_CLINIC_OR_DEPARTMENT_OTHER): Payer: Self-pay

## 2020-07-13 MED FILL — Omeprazole Cap Delayed Release 40 MG: ORAL | 30 days supply | Qty: 30 | Fill #1 | Status: AC

## 2020-07-15 ENCOUNTER — Other Ambulatory Visit (HOSPITAL_BASED_OUTPATIENT_CLINIC_OR_DEPARTMENT_OTHER): Payer: Self-pay

## 2020-07-15 ENCOUNTER — Encounter: Payer: Self-pay | Admitting: Family Medicine

## 2020-07-15 MED FILL — Simvastatin Tab 20 MG: ORAL | 90 days supply | Qty: 90 | Fill #0 | Status: AC

## 2020-07-17 ENCOUNTER — Other Ambulatory Visit (HOSPITAL_BASED_OUTPATIENT_CLINIC_OR_DEPARTMENT_OTHER): Payer: Medicare PPO

## 2020-07-22 ENCOUNTER — Other Ambulatory Visit (HOSPITAL_BASED_OUTPATIENT_CLINIC_OR_DEPARTMENT_OTHER): Payer: Medicare PPO

## 2020-07-31 ENCOUNTER — Other Ambulatory Visit (HOSPITAL_BASED_OUTPATIENT_CLINIC_OR_DEPARTMENT_OTHER): Payer: Self-pay

## 2020-07-31 DIAGNOSIS — N812 Incomplete uterovaginal prolapse: Secondary | ICD-10-CM | POA: Diagnosis not present

## 2020-07-31 DIAGNOSIS — Z6841 Body Mass Index (BMI) 40.0 and over, adult: Secondary | ICD-10-CM | POA: Insufficient documentation

## 2020-07-31 DIAGNOSIS — N3281 Overactive bladder: Secondary | ICD-10-CM | POA: Diagnosis not present

## 2020-07-31 DIAGNOSIS — R351 Nocturia: Secondary | ICD-10-CM | POA: Diagnosis not present

## 2020-07-31 MED ORDER — SOLIFENACIN SUCCINATE 10 MG PO TABS
10.0000 mg | ORAL_TABLET | Freq: Every day | ORAL | 1 refills | Status: DC
  Filled 2020-07-31: qty 30, 30d supply, fill #0

## 2020-07-31 MED FILL — Meloxicam Tab 15 MG: ORAL | 30 days supply | Qty: 30 | Fill #2 | Status: AC

## 2020-08-03 ENCOUNTER — Encounter (INDEPENDENT_AMBULATORY_CARE_PROVIDER_SITE_OTHER): Payer: Self-pay | Admitting: Family Medicine

## 2020-08-03 ENCOUNTER — Other Ambulatory Visit (HOSPITAL_BASED_OUTPATIENT_CLINIC_OR_DEPARTMENT_OTHER): Payer: Self-pay

## 2020-08-03 ENCOUNTER — Ambulatory Visit (INDEPENDENT_AMBULATORY_CARE_PROVIDER_SITE_OTHER): Payer: Medicare PPO | Admitting: Family Medicine

## 2020-08-03 ENCOUNTER — Other Ambulatory Visit: Payer: Self-pay

## 2020-08-03 VITALS — BP 147/75 | HR 77 | Temp 98.0°F | Ht 61.0 in | Wt 243.0 lb

## 2020-08-03 DIAGNOSIS — F3289 Other specified depressive episodes: Secondary | ICD-10-CM

## 2020-08-03 DIAGNOSIS — E66813 Obesity, class 3: Secondary | ICD-10-CM

## 2020-08-03 DIAGNOSIS — E559 Vitamin D deficiency, unspecified: Secondary | ICD-10-CM

## 2020-08-03 DIAGNOSIS — E119 Type 2 diabetes mellitus without complications: Secondary | ICD-10-CM

## 2020-08-03 DIAGNOSIS — Z6841 Body Mass Index (BMI) 40.0 and over, adult: Secondary | ICD-10-CM

## 2020-08-03 DIAGNOSIS — I10 Essential (primary) hypertension: Secondary | ICD-10-CM

## 2020-08-03 MED ORDER — BUPROPION HCL ER (SR) 150 MG PO TB12
ORAL_TABLET | Freq: Every day | ORAL | 0 refills | Status: DC
  Filled 2020-08-03: qty 30, 30d supply, fill #0

## 2020-08-03 MED ORDER — VITAMIN D (ERGOCALCIFEROL) 1.25 MG (50000 UNIT) PO CAPS
ORAL_CAPSULE | ORAL | 0 refills | Status: DC
  Filled 2020-08-03: qty 4, 28d supply, fill #0

## 2020-08-04 ENCOUNTER — Other Ambulatory Visit (HOSPITAL_BASED_OUTPATIENT_CLINIC_OR_DEPARTMENT_OTHER): Payer: Self-pay

## 2020-08-10 NOTE — Progress Notes (Signed)
Chief Complaint:   [name] [name] [name] is here to discuss her progress with her [name] treatment plan along with follow-up of her [name] related diagnoses. [name] [name] is on the Category 2 Plan [name] keeping a food journal [name] adhering to recommended goals of 400-500 calories [name] 35 grams of protein at supper daily [name] states she is following her eating plan approximately 50% of the time. [name] [name] states she is walking 20 minutes, [name] doing leg exercises for 10-15 minutes 7 times per week.  Today's visit was #: 24 Starting weight: 248 lbs Starting date: 06/21/2017 Today's weight: 243 lbs Today's date: 08/03/2020 Total lbs lost to date: 5 Total lbs lost since last in-office visit: 4  Interim History: [name] [name] has done well with weight loss since her last visit. Her walking is limited due to now wearing a camboot per Ortho. She is trying to stay active however.  Subjective:   1. Type 2 diabetes mellitus without complication, without long-term current use of insulin (HCC) [name] [name]'s fasting BGs mostly range between 110-120. She does not have any hypoglycemic episodes recorded, [name] she continues to work on diet [name] weight loss.  2. Essential hypertension [name] [name]'s blood pressure medications recently increased [name] her blood pressure has improved. She denies chest pain or feeling lightheaded or dizzy.  3. Vitamin D [name] [name] [name] is stable on [name] D, [name] she denies signs of over-replacement.  4. Other depression, emotional eating [name] [name]'s mood is stable on [name], [name] she denies worsening insomnia.  Assessment/Plan:   1. Type 2 diabetes mellitus without complication, without long-term current use of insulin (HCC) [name] [name] will continue to increase activity to help control her blood sugars while her ankle is healing. Good blood sugar control is important to decrease the likelihood of diabetic complications such as nephropathy, neuropathy, limb loss, blindness, coronary artery disease, [name]  death. Intensive lifestyle modification including diet, exercise [name] weight loss are the first line of treatment for diabetes.   2. Essential hypertension [name] [name] will continue her medications [name] weight loss, [name] she is to make  sure to increase her water intake to improve blood pressure control. We will watch for signs of hypotension as she continues her lifestyle modifications.  3. Vitamin D [name] [name] [name], [name] [name], [name] colon cancer. We will refill prescription Vitamin D for 1 month. [name] [name] will follow-up for routine testing of Vitamin D, at least 2-3 times per year to avoid over-replacement.  - Vitamin D, Ergocalciferol, (DRISDOL) 1.25 MG (50000 UNIT) CAPS capsule; TAKE 1 CAPSULE (50,000 UNITS TOTAL) BY MOUTH EVERY 7 (SEVEN) DAYS.  Dispense: 4 capsule; Refill: 0  4. Other depression, emotional eating Behavior modification techniques were discussed today to help [name] [name] deal with her emotional/non-hunger eating behaviors. We will refill [name] [name] for 1 month. Orders [name] follow up as documented in patient record.   - buPROPion ([name] [name]) 150 MG 12 hr tablet; TAKE 1 TABLET (150 MG TOTAL) BY MOUTH DAILY.  Dispense: 30 tablet; Refill: 0  5. [name] [name] [name] is currently in the action stage of change. As such, her goal is to continue with weight loss efforts. She has [name] to the Category 2 Plan [name] keeping a food journal [name] adhering to recommended goals of 400-550 calories [name] 35+ grams of protein at supper daily.   Exercise goals: As is.  Behavioral modification strategies: increasing water intake.  [name] [name] has [name] to follow-up with our clinic in 4 weeks.  She was informed of the importance of frequent follow-up visits to maximize her success with intensive lifestyle modifications for her multiple health conditions.   Objective:   Blood pressure (!) 147/75, pulse 77, temperature 98 F  (36.7 C), height 5\' 1"  (1.549 m), weight 243 lb (110.2 kg), SpO2 97 %. Body mass index is 45.91 kg/m.  General: Cooperative, alert, well developed, in no acute distress. HEENT: Conjunctivae [name] lids unremarkable. Cardiovascular: Regular rhythm.  Lungs: Normal work of breathing. Neurologic: No focal deficits.   Lab Results  Component Value Date   CREATININE 0.53 04/20/2020   BUN 15 04/20/2020   NA 137 04/20/2020   K 4.3 04/20/2020   CL 101 04/20/2020   CO2 26 04/20/2020   Lab Results  Component Value Date   ALT 15 04/20/2020   AST 21 04/20/2020   ALKPHOS 49 04/20/2020   BILITOT 0.8 04/20/2020   Lab Results  Component Value Date   HGBA1C 6.4 04/20/2020   HGBA1C 6.1 (H) 10/16/2019   HGBA1C 6.1 (H) 07/01/2019   HGBA1C 6.6 (H) 12/31/2018   HGBA1C 6.3 08/29/2018   Lab Results  Component Value Date   INSULIN 21.1 07/01/2019   INSULIN 24.2 09/28/2017   INSULIN 17.6 06/21/2017   Lab Results  Component Value Date   TSH 1.860 07/01/2019   Lab Results  Component Value Date   CHOL 204 (H) 04/20/2020   HDL 51.00 04/20/2020   LDLCALC 117 (H) 10/16/2019   LDLDIRECT 122.0 04/20/2020   TRIG 207.0 (H) 04/20/2020   CHOLHDL 4 04/20/2020   Lab Results  Component Value Date   WBC 6.3 04/20/2020   HGB 13.0 04/20/2020   HCT 39.2 04/20/2020   MCV 89.4 04/20/2020   PLT 269.0 04/20/2020   No results found for: IRON, TIBC, FERRITIN  [name] Behavioral Intervention:   Approximately 15 minutes were spent on the discussion below.  ASK: We discussed the diagnosis of [name] with Jasmin [name] today [name] [name] to give [name] permission to discuss [name] behavioral modification therapy today.  ASSESS: [name] has the diagnosis of [name] [name] her BMI today is 45.94. [name] is in the action stage of change.   ADVISE: Jasmin [name] was educated on the multiple health risks of [name] as well as the benefit of weight loss to improve her health. She was advised of the need for long term  treatment [name] the importance of lifestyle modifications to improve her current health [name] to decrease her risk of future health problems.  AGREE: Multiple dietary modification options [name] treatment options were discussed [name] Daven [name] to follow the recommendations documented in the above note.  ARRANGE: Kalissa was educated on the importance of frequent visits to treat [name] as outlined per CMS [name] USPSTF guidelines [name] to schedule her next follow up appointment today.  Attestation Statements:   Reviewed by clinician on day of visit: allergies, medications, problem list, medical history, surgical history, family history, social history, [name] previous encounter notes.   I, Trixie Dredge, am acting as transcriptionist for Dennard Nip, MD.  I have reviewed the above documentation for accuracy [name] completeness, [name] I agree with the above. -  Dennard Nip, MD

## 2020-08-12 ENCOUNTER — Encounter: Payer: Self-pay | Admitting: Family Medicine

## 2020-08-12 DIAGNOSIS — M79672 Pain in left foot: Secondary | ICD-10-CM | POA: Diagnosis not present

## 2020-08-12 DIAGNOSIS — I10 Essential (primary) hypertension: Secondary | ICD-10-CM

## 2020-08-12 DIAGNOSIS — M79671 Pain in right foot: Secondary | ICD-10-CM | POA: Diagnosis not present

## 2020-08-12 MED ORDER — LOSARTAN POTASSIUM 50 MG PO TABS
75.0000 mg | ORAL_TABLET | Freq: Every day | ORAL | 3 refills | Status: DC
  Filled 2020-08-12: qty 135, 90d supply, fill #0
  Filled 2020-11-16: qty 135, 90d supply, fill #1
  Filled 2021-01-27 – 2021-02-01 (×2): qty 135, 90d supply, fill #2
  Filled 2021-04-22: qty 135, 90d supply, fill #3

## 2020-08-13 ENCOUNTER — Other Ambulatory Visit: Payer: Self-pay

## 2020-08-13 ENCOUNTER — Other Ambulatory Visit (HOSPITAL_BASED_OUTPATIENT_CLINIC_OR_DEPARTMENT_OTHER): Payer: Self-pay

## 2020-08-13 ENCOUNTER — Other Ambulatory Visit: Payer: Self-pay | Admitting: Family Medicine

## 2020-08-13 DIAGNOSIS — Z8673 Personal history of transient ischemic attack (TIA), and cerebral infarction without residual deficits: Secondary | ICD-10-CM

## 2020-08-13 MED ORDER — SOLIFENACIN SUCCINATE 10 MG PO TABS
10.0000 mg | ORAL_TABLET | Freq: Every day | ORAL | 1 refills | Status: DC
  Filled 2020-08-13: qty 30, 30d supply, fill #0
  Filled 2020-09-04 – 2020-09-07 (×2): qty 30, 30d supply, fill #1

## 2020-08-13 MED ORDER — CLOPIDOGREL BISULFATE 75 MG PO TABS
75.0000 mg | ORAL_TABLET | Freq: Every day | ORAL | 1 refills | Status: DC
  Filled 2020-08-13: qty 90, 90d supply, fill #0
  Filled 2020-11-16: qty 90, 90d supply, fill #1

## 2020-08-13 MED FILL — Metformin HCl Tab 500 MG: ORAL | 90 days supply | Qty: 180 | Fill #0 | Status: AC

## 2020-08-17 ENCOUNTER — Other Ambulatory Visit (HOSPITAL_BASED_OUTPATIENT_CLINIC_OR_DEPARTMENT_OTHER): Payer: Self-pay

## 2020-08-17 MED FILL — Glucose Blood Test Strip: 50 days supply | Qty: 50 | Fill #0 | Status: AC

## 2020-08-25 ENCOUNTER — Other Ambulatory Visit: Payer: Self-pay | Admitting: Family Medicine

## 2020-08-25 ENCOUNTER — Other Ambulatory Visit (HOSPITAL_BASED_OUTPATIENT_CLINIC_OR_DEPARTMENT_OTHER): Payer: Self-pay

## 2020-08-25 DIAGNOSIS — E782 Mixed hyperlipidemia: Secondary | ICD-10-CM

## 2020-08-25 MED ORDER — OMEGA-3-ACID ETHYL ESTERS 1 G PO CAPS
2.0000 | ORAL_CAPSULE | Freq: Two times a day (BID) | ORAL | 0 refills | Status: DC
  Filled 2020-08-25: qty 360, 90d supply, fill #0

## 2020-08-25 MED FILL — Meloxicam Tab 15 MG: ORAL | 30 days supply | Qty: 30 | Fill #3 | Status: AC

## 2020-08-26 ENCOUNTER — Ambulatory Visit: Payer: Self-pay | Admitting: Adult Health

## 2020-08-26 ENCOUNTER — Encounter: Payer: Self-pay | Admitting: Family Medicine

## 2020-08-26 ENCOUNTER — Other Ambulatory Visit (HOSPITAL_BASED_OUTPATIENT_CLINIC_OR_DEPARTMENT_OTHER): Payer: Self-pay

## 2020-08-26 ENCOUNTER — Other Ambulatory Visit: Payer: Self-pay

## 2020-08-26 ENCOUNTER — Telehealth: Payer: Self-pay

## 2020-08-26 ENCOUNTER — Ambulatory Visit (INDEPENDENT_AMBULATORY_CARE_PROVIDER_SITE_OTHER): Payer: Medicare PPO | Admitting: Family Medicine

## 2020-08-26 VITALS — BP 159/72 | HR 73 | Temp 98.3°F | Wt 249.0 lb

## 2020-08-26 DIAGNOSIS — R350 Frequency of micturition: Secondary | ICD-10-CM | POA: Diagnosis not present

## 2020-08-26 DIAGNOSIS — K2900 Acute gastritis without bleeding: Secondary | ICD-10-CM

## 2020-08-26 DIAGNOSIS — K219 Gastro-esophageal reflux disease without esophagitis: Secondary | ICD-10-CM

## 2020-08-26 LAB — POC URINALSYSI DIPSTICK (AUTOMATED)
Bilirubin, UA: NEGATIVE
Blood, UA: NEGATIVE
Glucose, UA: NEGATIVE
Ketones, UA: NEGATIVE
Leukocytes, UA: NEGATIVE
Nitrite, UA: NEGATIVE
Protein, UA: NEGATIVE
Spec Grav, UA: >=1.03 — AB (ref 1.010–1.025)
Urobilinogen, UA: 1 E.U./dL
pH, UA: 7 (ref 5.0–8.0)

## 2020-08-26 MED ORDER — SUCRALFATE 1 G PO TABS
1.0000 g | ORAL_TABLET | Freq: Three times a day (TID) | ORAL | 0 refills | Status: DC
  Filled 2020-08-26: qty 120, 30d supply, fill #0

## 2020-08-26 MED ORDER — OMEPRAZOLE 40 MG PO CPDR
DELAYED_RELEASE_CAPSULE | ORAL | 0 refills | Status: DC
  Filled 2020-08-26: qty 90, 90d supply, fill #0

## 2020-08-26 MED ORDER — ONDANSETRON HCL 4 MG PO TABS
4.0000 mg | ORAL_TABLET | Freq: Three times a day (TID) | ORAL | 0 refills | Status: DC | PRN
  Filled 2020-08-26: qty 20, 7d supply, fill #0

## 2020-08-26 NOTE — Patient Instructions (Signed)
I believe you have inflammation of your stomach.  We will call you with results once available.   Zofran every 8 hrs as needed for nausea.  Carafate about 30 minutes before meals and before bed. This coats the stomach to allow to heal.   Refill omeprazole for you.    Gastritis, Adult  Gastritis is swelling (inflammation) of the stomach. Gastritis can develop quickly (acute). It can also develop slowly over time (chronic). It is important to get help for this condition. If you do not get help, your stomach can bleed, and you can get sores (ulcers) in your stomach. What are the causes? This condition may be caused by: Germs that get to your stomach. Drinking too much alcohol. Medicines you are taking. Too much acid in the stomach. A disease of the intestines or stomach. Stress. An allergic reaction. Crohn's disease. Some cancer treatments (radiation). Sometimes the cause of this condition is not known. What are the signs or symptoms? Symptoms of this condition include: Pain in your stomach. A burning feeling in your stomach. Feeling sick to your stomach (nauseous). Throwing up (vomiting). Feeling too full after you eat. Weight loss. Bad breath. Throwing up blood. Blood in your poop (stool). How is this diagnosed? This condition may be diagnosed with: Your medical history and symptoms. A physical exam. Tests. These can include: Blood tests. Stool tests. A procedure to look inside your stomach (upper endoscopy). A test in which a sample of tissue is taken for testing (biopsy). How is this treated? Treatment for this condition depends on what caused it. You may be given: Antibiotic medicine, if your condition was caused by germs. H2 blockers and similar medicines, if your condition was caused by too much acid. Follow these instructions at home: Medicines Take over-the-counter and prescription medicines only as told by your doctor. If you were prescribed an antibiotic  medicine, take it as told by your doctor. Do not stop taking it even if you start to feel better. Eating and drinking  Eat small meals often, instead of large meals. Avoid foods and drinks that make your symptoms worse. Drink enough fluid to keep your pee (urine) pale yellow.  Alcohol use Do not drink alcohol if: Your doctor tells you not to drink. You are pregnant, may be pregnant, or are planning to become pregnant. If you drink alcohol: Limit your use to: 0-1 drink a day for women. 0-2 drinks a day for men. Be aware of how much alcohol is in your drink. In the [place] one drink equals one 12 oz bottle of beer (355 mL), one 5 oz glass of wine (148 mL), or one 1 oz glass of hard liquor (44 mL). General instructions Talk with your doctor about ways to manage stress. You can exercise or do deep breathing, meditation, or yoga. Do not smoke or use products that have nicotine or tobacco. If you need help quitting, ask your doctor. Keep all follow-up visits as told by your doctor. This is important. Contact a doctor if: Your symptoms get worse. Your symptoms go away and then come back. Get help right away if: You throw up blood or something that looks like coffee grounds. You have black or dark red poop. You throw up any time you try to drink fluids. Your stomach pain gets worse. You have a fever. You do not feel better after one week. Summary Gastritis is swelling (inflammation) of the stomach. You must get help for this condition. If you do not get help,  your stomach can bleed, and you can get sores (ulcers). This condition is diagnosed with medical history, physical exam, or tests. You can be treated with medicines for germs or medicines to block too much acid in your stomach. This information is not intended to replace advice given to you by your health care provider. Make sure you discuss any questions you have with your healthcare provider. Document Revised: 06/27/2017 Document  Reviewed: 06/27/2017 Elsevier Patient Education  [place].

## 2020-08-26 NOTE — Telephone Encounter (Signed)
FYI: Pt called with c/o nausea since Monday it worsened yesterday. Denies any fever, cough, or exposure. Pt was scheduled with Cory Naf on 08/26/20.

## 2020-08-26 NOTE — Progress Notes (Signed)
This visit occurred during the SARS-CoV-2 public health emergency.  Safety protocols were in place, including screening questions prior to the visit, additional usage of staff PPE, and extensive cleaning of exam room while observing appropriate contact time as indicated for disinfecting solutions.    Jasmin [name] , 03-Sep-1945, 75 y.o., female MRN: [id number] Patient Care Team    Relationship Specialty Notifications Start End  Copland, Gay Filler, MD PCP - General Family Medicine  08/08/16    Comment: PCP was not changed when patient was Arrived as New Patient/SLS 07/12  Charlaine Dalton, MD Consulting Physician Pulmonary Disease  08/08/16   Donette Larry, MD Consulting Physician Orthopedic Surgery  08/08/16   Hollar, Katharine Look, MD Consulting Physician Dermatology  08/08/16   Janeann Forehand, [place] Physician Sports Medicine  08/08/16   Puschinsky, Fransico Him., MD Consulting Physician General Surgery  08/08/16   Pill, Barnabas Lister, MD Consulting Physician Orthopedic Surgery  08/08/16   Merilynn Finland., DDS Consulting Physician Dentistry  08/08/16   Frazier Butt., MD  Obstetrics and Gynecology  05/25/17   Marti Sleigh, MD Referring Physician Gynecology  05/21/20     Chief Complaint  Patient presents with   Nausea    Pt c/o nausea, fatigue, light headed, loss of appetite x 2 days; pt has been out of GERD meds for 2 weeks;      Subjective: Pt presents for an OV with complaints of nausea, fatigue, lightheaded, loss of appetite for about 2 days.  She reports symptoms seem to have started on Monday when she was out working in her garden and became dizzy.  Reports she did feel like she got overheated.  She feels the dizziness got better when she went inside and cool down, the remainder of the symptoms did not resolve.  She reports she has had a history of reflux and had been placed on omeprazole about 1 year ago.  She did run out of this medication.  She attempted  to get a refill from her GI, but she needed to make a follow-up appointment in order to receive refills. She denies any bowel changes.  She does endorse feeling more gassy and notes belching more frequently.  She denies emesis, but states when she tries to eat anything it makes her stomach burning and crampy.  Patient reports she has not had an EGD in the past.  Her colonoscopy is up-to-date. She is prescribed chronic prophylactic Keflex 250 mg daily for bladder infection.  She reports she has frequent urination and incontinence chronically.  She denies any burning with urination today.  She denies any fever, chills or headache. She does take a chronic NSAID.  She does not drink alcohol.  Depression screen [facility] 2/9 08/14/2018 01/31/2018 01/31/2018 06/21/2017 05/25/2017  Decreased Interest 0 0 0 3 0  Down, Depressed, Hopeless 0 0 0 1 0  PHQ - 2 Score 0 0 0 4 0  Altered sleeping - - - 3 -  Tired, decreased energy - - - 3 -  Change in appetite - - - 1 -  Feeling bad or failure about yourself  - - - 1 -  Trouble concentrating - - - 1 -  Moving slowly or fidgety/restless - - - 2 -  Suicidal thoughts - - - 0 -  PHQ-9 Score - - - 15 -  Difficult doing work/chores - - - Not difficult at all -    Allergies  Allergen Reactions   Latex  Itching   Ciprofloxacin Hives   Metronidazole Hives   Adhesive [Tape] Rash   Social History   Social History Narrative   Not on file   Past Medical History:  Diagnosis Date   Acid reflux disease 03/09/2015   Aortic stenosis 03/09/2015   AR (allergic rhinitis) 03/09/2015   Arthritis    Back pain    Controlled type 2 diabetes mellitus without complication, without long-term current use of insulin ([place]) 08/08/2016   Cough    Cystocele with rectocele 08/21/2016   Decreased hearing    Diabetes mellitus without complication (HCC)    Dry mouth    Easy bruising    Essential hypertension 08/08/2016   Heart murmur    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    History of CVA (cerebrovascular accident) 08/08/2016   Seen on MRI from 08/2015   Hyperlipidemia    Hypertension    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   IBS (irritable bowel syndrome) 07/16/2015   Insomnia 07/16/2015   Joint pain    Knee pain    Left ventricular hypertrophy 07/16/2015   Leg cramping    right leg   Mixed hyperlipidemia 08/08/2016   Morbid obesity ([place]) 11/10/2015   Nasal discharge    Nuclear sclerotic cataract of left eye 07/07/2016   OAB (overactive bladder) 01/20/2016   OSA (obstructive sleep apnea) 03/09/2015   Osteoporosis 03/09/2015   Primary osteoarthritis of left knee 03/31/2015   Pulmonary hypertension ([place]) 07/16/2015   Red eyes    Right kidney stone    Shortness of breath on exertion    Sleep apnea    Stroke ([place])    Swelling of both lower extremities    UTI (urinary tract infection)    Vitamin D deficiency 03/09/2015   Past Surgical History:  Procedure Laterality Date   ABDOMINAL HYSTERECTOMY  09/14/2016   CATARACT EXTRACTION Left    CATARACT EXTRACTION Left 06/2016   COLPORRHAPHY N/A 09/14/2016   [facility]   CYSTOCELE REPAIR     FRACTURE SURGERY Left 1953   L arm   FRACTURE SURGERY Left 1985   L ankle   HERNIA REPAIR  2004   JOINT REPLACEMENT Right 2012   R TKA   JOINT REPLACEMENT Left 2017   KIDNEY STONE SURGERY     LAPAROSCOPIC ASSISTED VAGINAL HYSTERECTOMY N/A 09/14/2016   [facility]   R foot/ankle Right 2013 and 2014   plate and screws lateral foot and tendon removal medial foot in 2013, revision in 2014   [place]     URETEROSCOPY     Family History  Problem Relation Age of Onset   Hyperlipidemia Mother    Heart disease Mother    Hypertension Mother    Stroke Mother    Thyroid disease Mother    Hypertension Father    Heart disease Father    Allergies as of 08/26/2020       Reactions   Latex Itching   Ciprofloxacin Hives   Metronidazole Hives   Adhesive [tape] Rash        Medication List         Accurate as of August 26, 2020 11:59 PM. If you have any questions, ask your nurse or doctor.          STOP taking these medications    estradiol 0.1 MG/GM vaginal cream Commonly known as: ESTRACE Stopped by: Howard Pouch, DO   naproxen sodium 220 MG tablet Commonly known as: ALEVE  Stopped by: Howard Pouch, DO   nitrofurantoin (macrocrystal-monohydrate) 100 MG capsule Commonly known as: MACROBID Stopped by: Howard Pouch, DO   Pfizer-BioNTech COVID-19 Vacc 30 MCG/0.3ML injection Generic drug: COVID-19 mRNA vaccine Therapist, music) Stopped by: Howard Pouch, DO       TAKE these medications    Accu-Chek Guide test strip Generic drug: glucose blood USE AS DIRECTED TO CHECK BLOOD GLUCOSE ONCE DAILY   Accu-Chek Guide w/Device Kit USE AS DIRECTED ONCE DAILY TO CHECK BLOOD GLUCOSE   Accu-Chek Softclix Lancets lancets USE AS DIRECTED TO CHECK BLOOD GLUCOSE ONCE DAILY   amoxicillin 500 MG capsule Commonly known as: AMOXIL Take prior to dentist procedure   blood glucose meter kit and supplies Kit Dispense based on patient and insurance preference. Use once daily to monitor glucose as needed (FOR ICD-9 250.00, 250.01).   buPROPion 150 MG 12 hr tablet Commonly known as: WELLBUTRIN SR TAKE 1 TABLET (150 MG TOTAL) BY MOUTH DAILY.   CALCIUM PO Take 1 tablet by mouth daily.   cephALEXin 250 MG capsule Commonly known as: KEFLEX Take 1 capsule (250 mg total) by mouth daily. What changed: when to take this   clopidogrel 75 MG tablet Commonly known as: PLAVIX Take 1 tablet (75 mg total) by mouth daily.   desmopressin 0.2 MG tablet Commonly known as: DDAVP Take 3 tablets by mouth nightly   desonide 0.05 % cream Commonly known as: DESOWEN Apply a thin layer to the affected area twice daily for 2 weeks then stop for 1 week. Repeat for flares.   econazole nitrate 1 % cream Apply topically as needed.   fluticasone 50 MCG/ACT nasal spray Commonly known as: FLONASE Place 2 sprays  into both nostrils daily.   losartan 50 MG tablet Commonly known as: COZAAR Take 1 & 1/2 tablets (75 mg total) by mouth daily.   meloxicam 15 MG tablet Commonly known as: MOBIC TAKE 1 TABLET (15 MG) BY MOUTH IN THE MORNING.   metFORMIN 500 MG tablet Commonly known as: GLUCOPHAGE TAKE 1 TABLET (500 MG TOTAL) BY MOUTH 2 (TWO) TIMES DAILY WITH A MEAL.   Myrbetriq 50 MG Tb24 tablet Generic drug: mirabegron ER Take 1 tablet (50 mg total) by mouth daily.   omega-3 acid ethyl esters 1 g capsule Commonly known as: LOVAZA TAKE 2 CAPSULES BY MOUTH TWICE DAILY   omeprazole 40 MG capsule Commonly known as: PRILOSEC TAKE 1 CAPSULE (40 MG TOTAL) BY MOUTH DAILY FOR 30 DAYS.   ondansetron 4 MG tablet Commonly known as: ZOFRAN Take 1 tablet (4 mg total) by mouth every 8 (eight) hours as needed for nausea or vomiting. Started by: Howard Pouch, DO   oxyCODONE-acetaminophen 5-325 MG tablet Commonly known as: Percocet Take 1 tablet by mouth every 8 (eight) hours as needed for severe pain.   PROBIOTIC-10 PO Take 1 capsule by mouth daily.   PSYLLIUM PO Take by mouth.   simvastatin 20 MG tablet Commonly known as: ZOCOR TAKE 1 TABLET (20 MG TOTAL) BY MOUTH AT BEDTIME.   solifenacin 10 MG tablet Commonly known as: VESICARE TAKE 1 TABLET (10 MG TOTAL) BY MOUTH DAILY.   solifenacin 10 MG tablet Commonly known as: VESICARE Take 1 tablet (10 mg total) by mouth daily.   sucralfate 1 g tablet Commonly known as: Carafate Take 1 tablet (1 g total) by mouth 4 (four) times daily -  with meals and at bedtime. Started by: Howard Pouch, DO   Vitamin D (Ergocalciferol) 1.25 MG (50000 UNIT) Caps capsule  Commonly known as: DRISDOL TAKE 1 CAPSULE (50,000 UNITS TOTAL) BY MOUTH EVERY 7 (SEVEN) DAYS.   Vitamin D3 50 MCG (2000 UT) Tabs Take by mouth. Take one tablet in the AM   ZEASORB-AF EX Apply topically daily as needed.        All past medical history, surgical history, allergies, family  history, immunizations andmedications were updated in the EMR today and reviewed under the history and medication portions of their EMR.     ROS: Negative, with the exception of above mentioned in HPI   Objective:  BP (!) 159/72   Pulse 73   Temp 98.3 F (36.8 C) (Oral)   Wt 249 lb (112.9 kg)   SpO2 99%   BMI 47.05 kg/m  Body mass index is 47.05 kg/m. Gen: Afebrile. No acute distress. Nontoxic in appearance, well developed, well nourished.  Very pleasant female. HENT: AT. [place].  Eyes:Pupils Equal Round Reactive to light, Extraocular movements intact,  Conjunctiva without redness, discharge or icterus. CV: RRR  Chest: CTAB, no wheeze or crackles.  Abd: Soft.  NTND. BS present.  No rebound or guarding.  Skin: No rashes, purpura or petechiae.  Neuro:  Normal gait. PERLA. EOMi. Alert. Oriented x3  Psych: Normal affect, dress and demeanor. Normal speech. Normal thought content and judgment.  No results found. No results found. No results found for this or any previous visit (from the past 24 hour(s)).  Assessment/Plan: Jasmin [name] is a 75 y.o. female present for OV for  Acute gastritis without hemorrhage, unspecified gastritis type/chronic GERD Suspect gastritis as cause of her symptoms.  She has never had an EGD or been tested for H. pylori in the past.  She is out of her reflux medicines over the last 2 weeks, and she is on chronic NSAIDs. Will test for H. pylori IgG today if positive would assume new infection for her and treat appropriately. Avoid NSAIDs at least temporarily. Start omeprazole 40 mg daily. Start Carafate 1 tablet before meals and before bed. Zofran prescribed to help with nausea. - H. pylori antibody, IgG  Urinary frequency Symptoms could be consistent with UTI. To be cautious we will complete a urinalysis with reflex culture, since it would be difficult for her to know if she had a UTI when she has chronic urinary incontinence and urinary frequency. -  POCT Urinalysis Dipstick (Automated)  Follow-up with PCP or gastroenterologist in 2 weeks if not seeing any improvement, sooner if worsening.   Reviewed expectations re: course of current medical issues. Discussed self-management of symptoms. Outlined signs and symptoms indicating need for more acute intervention. Patient verbalized understanding and all questions were answered. Patient received an After-Visit Summary.    Orders Placed This Encounter  Procedures   H. pylori antibody, IgG   POCT Urinalysis Dipstick (Automated)   Meds ordered this encounter  Medications   omeprazole (PRILOSEC) 40 MG capsule    Sig: TAKE 1 CAPSULE (40 MG TOTAL) BY MOUTH DAILY FOR 30 DAYS.    Dispense:  90 capsule    Refill:  0   sucralfate (CARAFATE) 1 g tablet    Sig: Take 1 tablet (1 g total) by mouth 4 (four) times daily -  with meals and at bedtime.    Dispense:  120 tablet    Refill:  0   ondansetron (ZOFRAN) 4 MG tablet    Sig: Take 1 tablet (4 mg total) by mouth every 8 (eight) hours as needed for nausea or vomiting.    Dispense:  20 tablet    Refill:  0   Referral Orders  No referral(s) requested today     Note is dictated utilizing voice recognition software. Although note has been proof read prior to signing, occasional typographical errors still can be missed. If any questions arise, please do not hesitate to call for verification.   electronically signed by:  Howard Pouch, DO  [place]

## 2020-08-27 LAB — H. PYLORI ANTIBODY, IGG: H. pylori, IgG AbS: 0.18 Index Value (ref 0.00–0.79)

## 2020-09-03 ENCOUNTER — Encounter (INDEPENDENT_AMBULATORY_CARE_PROVIDER_SITE_OTHER): Payer: Self-pay | Admitting: Family Medicine

## 2020-09-03 ENCOUNTER — Other Ambulatory Visit (HOSPITAL_BASED_OUTPATIENT_CLINIC_OR_DEPARTMENT_OTHER): Payer: Self-pay

## 2020-09-03 ENCOUNTER — Ambulatory Visit (INDEPENDENT_AMBULATORY_CARE_PROVIDER_SITE_OTHER): Payer: Medicare PPO | Admitting: Family Medicine

## 2020-09-03 ENCOUNTER — Other Ambulatory Visit: Payer: Self-pay

## 2020-09-03 VITALS — BP 145/79 | HR 66 | Temp 97.9°F | Ht 61.0 in | Wt 232.0 lb

## 2020-09-03 DIAGNOSIS — F3289 Other specified depressive episodes: Secondary | ICD-10-CM

## 2020-09-03 DIAGNOSIS — E559 Vitamin D deficiency, unspecified: Secondary | ICD-10-CM | POA: Diagnosis not present

## 2020-09-03 DIAGNOSIS — M169 Osteoarthritis of hip, unspecified: Secondary | ICD-10-CM

## 2020-09-03 DIAGNOSIS — Z6841 Body Mass Index (BMI) 40.0 and over, adult: Secondary | ICD-10-CM

## 2020-09-03 MED ORDER — BUPROPION HCL ER (SR) 150 MG PO TB12
ORAL_TABLET | Freq: Every day | ORAL | 0 refills | Status: DC
  Filled 2020-09-03: qty 30, 30d supply, fill #0

## 2020-09-03 MED ORDER — VITAMIN D (ERGOCALCIFEROL) 1.25 MG (50000 UNIT) PO CAPS
ORAL_CAPSULE | ORAL | 0 refills | Status: DC
  Filled 2020-09-03: qty 4, 28d supply, fill #0

## 2020-09-03 MED ORDER — MELOXICAM 15 MG PO TABS
ORAL_TABLET | ORAL | 11 refills | Status: DC
  Filled 2020-09-03: qty 30, 30d supply, fill #0

## 2020-09-04 ENCOUNTER — Other Ambulatory Visit (HOSPITAL_BASED_OUTPATIENT_CLINIC_OR_DEPARTMENT_OTHER): Payer: Self-pay

## 2020-09-04 DIAGNOSIS — Z961 Presence of intraocular lens: Secondary | ICD-10-CM | POA: Diagnosis not present

## 2020-09-04 DIAGNOSIS — H524 Presbyopia: Secondary | ICD-10-CM | POA: Diagnosis not present

## 2020-09-04 DIAGNOSIS — H26492 Other secondary cataract, left eye: Secondary | ICD-10-CM | POA: Diagnosis not present

## 2020-09-04 DIAGNOSIS — H52203 Unspecified astigmatism, bilateral: Secondary | ICD-10-CM | POA: Diagnosis not present

## 2020-09-04 DIAGNOSIS — H5203 Hypermetropia, bilateral: Secondary | ICD-10-CM | POA: Diagnosis not present

## 2020-09-04 DIAGNOSIS — H16223 Keratoconjunctivitis sicca, not specified as Sjogren's, bilateral: Secondary | ICD-10-CM | POA: Diagnosis not present

## 2020-09-04 DIAGNOSIS — H2511 Age-related nuclear cataract, right eye: Secondary | ICD-10-CM | POA: Diagnosis not present

## 2020-09-04 MED ORDER — CYCLOSPORINE 0.05 % OP EMUL
OPHTHALMIC | 1 refills | Status: DC
  Filled 2020-09-04: qty 180, 90d supply, fill #0

## 2020-09-07 ENCOUNTER — Other Ambulatory Visit (HOSPITAL_BASED_OUTPATIENT_CLINIC_OR_DEPARTMENT_OTHER): Payer: Self-pay

## 2020-09-14 NOTE — Progress Notes (Signed)
Chief Complaint:   OBESITY Lelani is here to discuss her progress with her obesity treatment plan along with follow-up of her obesity related diagnoses. Kery is on the Category 2 Plan and keeping a food journal and adhering to recommended goals of 400-550 calories and 35+ grams of protein at supper daily and states she is following her eating plan approximately 50-70% of the time. Emmelyn states she is walking 5,000 steps 7 times per week.  Today's visit was #: 25 Starting weight: 248 lbs Starting date: 06/21/2017 Today's weight: 232 lbs Today's date: 09/03/2020 Total lbs lost to date: 16 Total lbs lost since last in-office visit: 11  Interim History: Jenalynn continues to do well with weight loss on her plan. She is trying to walk more but she is off of her pain medications due to gastritis, and walking is more difficult.  Subjective:   1. Osteoarthritis of hip, unspecified laterality, unspecified osteoarthritis type Grant was told to stop her Mobic due to gastritis. She was told to stop for 2-4 weeks but her hip pain has increased.  2. Vitamin D deficiency Shalena is stable on Vit D, and she denies nausea or vomiting.  3. Other depression, emotional eating Samia is doing well with minimizing emotional eating behaviors. She is stable on her medications.  Assessment/Plan:   1. Osteoarthritis of hip, unspecified laterality, unspecified osteoarthritis type We will refill Mobic for 1 month. Aba is to take Tylenol for now until 2 weeks are up, and then only take Mobic with a full meal and continue her PPI. She will continue to follow up as directed.  - meloxicam (MOBIC) 15 MG tablet; TAKE 1 TABLET (15 MG) BY MOUTH IN THE MORNING.  Dispense: 30 tablet; Refill: 11  2. Vitamin D deficiency Low Vitamin D level contributes to fatigue and are associated with obesity, breast, and colon cancer. We will refill prescription Vitamin D for 1 month. Christopher will follow-up for routine  testing of Vitamin D, at least 2-3 times per year to avoid over-replacement.  - Vitamin D, Ergocalciferol, (DRISDOL) 1.25 MG (50000 UNIT) CAPS capsule; TAKE 1 CAPSULE (50,000 UNITS TOTAL) BY MOUTH EVERY 7 (SEVEN) DAYS.  Dispense: 4 capsule; Refill: 0  3. Other depression, emotional eating Behavior modification techniques were discussed today to help Margeart deal with her emotional/non-hunger eating behaviors. We will refill Wellbutrin SR for 1 month. Orders and follow up as documented in patient record.   - buPROPion (WELLBUTRIN SR) 150 MG 12 hr tablet; TAKE 1 TABLET (150 MG TOTAL) BY MOUTH DAILY.  Dispense: 30 tablet; Refill: 0  4. Obesity with current BMI 44.0 Mikiala is currently in the action stage of change. As such, her goal is to continue with weight loss efforts. She has agreed to the Category 2 Plan and keeping a food journal and adhering to recommended goals of 400-550 calories and 35+ grams of protein at supper daily.   Exercise goals: As is.  Behavioral modification strategies: increasing water intake and travel eating strategies.  Tifany has agreed to follow-up with our clinic in 3 to 4 weeks. She was informed of the importance of frequent follow-up visits to maximize her success with intensive lifestyle modifications for her multiple health conditions.   Objective:   Blood pressure (!) 145/79, pulse 66, temperature 97.9 F (36.6 C), height '5\' 1"'$  (1.549 m), weight 232 lb (105.2 kg), SpO2 98 %. Body mass index is 43.84 kg/m.  General: Cooperative, alert, well developed, in no acute distress. HEENT:  Conjunctivae and lids unremarkable. Cardiovascular: Regular rhythm.  Lungs: Normal work of breathing. Neurologic: No focal deficits.   Lab Results  Component Value Date   CREATININE 0.53 04/20/2020   BUN 15 04/20/2020   NA 137 04/20/2020   K 4.3 04/20/2020   CL 101 04/20/2020   CO2 26 04/20/2020   Lab Results  Component Value Date   ALT 15 04/20/2020   AST 21  04/20/2020   ALKPHOS 49 04/20/2020   BILITOT 0.8 04/20/2020   Lab Results  Component Value Date   HGBA1C 6.4 04/20/2020   HGBA1C 6.1 (H) 10/16/2019   HGBA1C 6.1 (H) 07/01/2019   HGBA1C 6.6 (H) 12/31/2018   HGBA1C 6.3 08/29/2018   Lab Results  Component Value Date   INSULIN 21.1 07/01/2019   INSULIN 24.2 09/28/2017   INSULIN 17.6 06/21/2017   Lab Results  Component Value Date   TSH 1.860 07/01/2019   Lab Results  Component Value Date   CHOL 204 (H) 04/20/2020   HDL 51.00 04/20/2020   LDLCALC 117 (H) 10/16/2019   LDLDIRECT 122.0 04/20/2020   TRIG 207.0 (H) 04/20/2020   CHOLHDL 4 04/20/2020   Lab Results  Component Value Date   VD25OH 38.16 04/20/2020   VD25OH 39.9 07/01/2019   VD25OH 31.2 03/06/2019   Lab Results  Component Value Date   WBC 6.3 04/20/2020   HGB 13.0 04/20/2020   HCT 39.2 04/20/2020   MCV 89.4 04/20/2020   PLT 269.0 04/20/2020   No results found for: IRON, TIBC, FERRITIN  Obesity Behavioral Intervention:   Approximately 15 minutes were spent on the discussion below.  ASK: We discussed the diagnosis of obesity with Bernyce today and Tiwanna agreed to give [name] permission to discuss obesity behavioral modification therapy today.  ASSESS: Princella has the diagnosis of obesity and her BMI today is 43.86. Jossalin is in the action stage of change.   ADVISE: Venesia was educated on the multiple health risks of obesity as well as the benefit of weight loss to improve her health. She was advised of the need for long term treatment and the importance of lifestyle modifications to improve her current health and to decrease her risk of future health problems.  AGREE: Multiple dietary modification options and treatment options were discussed and Elia agreed to follow the recommendations documented in the above note.  ARRANGE: Alexandra was educated on the importance of frequent visits to treat obesity as outlined per CMS and USPSTF guidelines and agreed  to schedule her next follow up appointment today.  Attestation Statements:   Reviewed by clinician on day of visit: allergies, medications, problem list, medical history, surgical history, family history, social history, and previous encounter notes.   I, Trixie Dredge, am acting as transcriptionist for Dennard Nip, MD.  I have reviewed the above documentation for accuracy and completeness, and I agree with the above. -  Dennard Nip, MD

## 2020-09-15 ENCOUNTER — Other Ambulatory Visit (HOSPITAL_BASED_OUTPATIENT_CLINIC_OR_DEPARTMENT_OTHER): Payer: Self-pay

## 2020-09-15 ENCOUNTER — Other Ambulatory Visit (INDEPENDENT_AMBULATORY_CARE_PROVIDER_SITE_OTHER): Payer: Self-pay | Admitting: Emergency Medicine

## 2020-09-15 DIAGNOSIS — H26492 Other secondary cataract, left eye: Secondary | ICD-10-CM | POA: Diagnosis not present

## 2020-09-28 ENCOUNTER — Other Ambulatory Visit (HOSPITAL_BASED_OUTPATIENT_CLINIC_OR_DEPARTMENT_OTHER): Payer: Self-pay

## 2020-09-28 MED FILL — Simvastatin Tab 20 MG: ORAL | 90 days supply | Qty: 90 | Fill #1 | Status: AC

## 2020-10-13 ENCOUNTER — Ambulatory Visit (INDEPENDENT_AMBULATORY_CARE_PROVIDER_SITE_OTHER): Payer: Medicare PPO | Admitting: Family Medicine

## 2020-10-13 DIAGNOSIS — I517 Cardiomegaly: Secondary | ICD-10-CM | POA: Diagnosis not present

## 2020-10-13 DIAGNOSIS — G4733 Obstructive sleep apnea (adult) (pediatric): Secondary | ICD-10-CM | POA: Diagnosis not present

## 2020-10-13 DIAGNOSIS — I272 Pulmonary hypertension, unspecified: Secondary | ICD-10-CM | POA: Diagnosis not present

## 2020-10-13 DIAGNOSIS — R053 Chronic cough: Secondary | ICD-10-CM | POA: Diagnosis not present

## 2020-10-14 ENCOUNTER — Encounter (INDEPENDENT_AMBULATORY_CARE_PROVIDER_SITE_OTHER): Payer: Self-pay | Admitting: Family Medicine

## 2020-10-14 ENCOUNTER — Ambulatory Visit (INDEPENDENT_AMBULATORY_CARE_PROVIDER_SITE_OTHER): Payer: Medicare PPO | Admitting: Family Medicine

## 2020-10-14 ENCOUNTER — Other Ambulatory Visit: Payer: Self-pay

## 2020-10-14 ENCOUNTER — Other Ambulatory Visit (HOSPITAL_BASED_OUTPATIENT_CLINIC_OR_DEPARTMENT_OTHER): Payer: Self-pay

## 2020-10-14 VITALS — BP 140/62 | HR 71 | Temp 97.9°F | Ht 61.0 in | Wt 233.0 lb

## 2020-10-14 DIAGNOSIS — Z6841 Body Mass Index (BMI) 40.0 and over, adult: Secondary | ICD-10-CM | POA: Diagnosis not present

## 2020-10-14 DIAGNOSIS — E119 Type 2 diabetes mellitus without complications: Secondary | ICD-10-CM | POA: Diagnosis not present

## 2020-10-14 DIAGNOSIS — F3289 Other specified depressive episodes: Secondary | ICD-10-CM | POA: Diagnosis not present

## 2020-10-14 DIAGNOSIS — E559 Vitamin D deficiency, unspecified: Secondary | ICD-10-CM

## 2020-10-14 MED ORDER — VITAMIN D (ERGOCALCIFEROL) 1.25 MG (50000 UNIT) PO CAPS
ORAL_CAPSULE | ORAL | 0 refills | Status: DC
  Filled 2020-10-14: qty 4, 28d supply, fill #0

## 2020-10-14 MED ORDER — BUPROPION HCL ER (SR) 150 MG PO TB12
ORAL_TABLET | Freq: Every day | ORAL | 0 refills | Status: DC
  Filled 2020-10-14: qty 30, 30d supply, fill #0

## 2020-10-14 NOTE — Progress Notes (Signed)
Chief Complaint:   OBESITY Jasmin [name] is here to discuss her progress with her obesity treatment plan along with follow-up of her obesity related diagnoses. Jasmin [name] is on the Category 2 Plan and keeping a food journal and adhering to recommended goals of 400-550 calories and 35+ grams of protein at supper daily and states she is following her eating plan approximately 50% of the time. Jasmin [name] states she is walking daily for 15 minutes, and knee and step balance for 25 minutes 5-6 times per week.  Today's visit was #: 50 Starting weight: 248 lbs Starting date: 06/21/2017 Today's weight: 233 lbs Today's date: 10/14/2020 Total lbs lost to date: 15 Total lbs lost since last in-office visit: 0  Interim History: Jasmin [name] is retaining a bit of fluid today. She notes some increase in stress eating and snacking lately.  Subjective:   1. Vitamin D deficiency Jasmin [name] is stable on Vit D, and she denies nausea, vomiting, r muscle weakness.  2. Type 2 diabetes mellitus without complication, without long-term current use of insulin (HCC) Pa's fasting BGs range between 106-120. She is working on diet and exercise, and she denies hypoglycemia. She notes some increase in simple carbohydrates but she is ready to get back on track.  3. Other depression, emotional eating Jasmin [name] is stable on Wellbutrin. She has increased stress and has had increased emotional eating behaviors, but she is mindful and aware.  Assessment/Plan:   1. Vitamin D deficiency Low Vitamin D level contributes to fatigue and are associated with obesity, breast, and colon cancer. We will refill prescription Vitamin D for 1 month. Jasmin [name] will follow-up for routine testing of Vitamin D, at least 2-3 times per year to avoid over-replacement.  - Vitamin D, Ergocalciferol, (DRISDOL) 1.25 MG (50000 UNIT) CAPS capsule; TAKE 1 CAPSULE (50,000 UNITS TOTAL) BY MOUTH EVERY 7 (SEVEN) DAYS.  Dispense: 4 capsule; Refill: 0  2. Type 2  diabetes mellitus without complication, without long-term current use of insulin ([place]) Jasmin [name] will continue with diet and exercise, and will follow up as directed. Good blood sugar control is important to decrease the likelihood of diabetic complications such as nephropathy, neuropathy, limb loss, blindness, coronary artery disease, and death. Intensive lifestyle modification including diet, exercise and weight loss are the first line of treatment for diabetes.   3. Other depression, emotional eating Emotional eating behaviors were discussed today to help Jasmin [name] deal with her emotional/non-hunger eating behaviors. We will refill Wellbutrin SR for 1 month. Orders and follow up as documented in patient record.   - buPROPion (WELLBUTRIN SR) 150 MG 12 hr tablet; TAKE 1 TABLET (150 MG TOTAL) BY MOUTH DAILY.  Dispense: 30 tablet; Refill: 0  4. Obesity with current BMI 44.1 Jasmin [name] is currently in the action stage of change. As such, her goal is to continue with weight loss efforts. She has agreed to the Category 2 Plan and keeping a food journal and adhering to recommended goals of 400-550 calories and 35+ grams of protein at supper daily.   Exercise goals: As is.  Behavioral modification strategies: increasing lean protein intake, meal planning and cooking strategies, and emotional eating strategies.  Jasmin [name] has agreed to follow-up with our clinic in 4 weeks. She was informed of the importance of frequent follow-up visits to maximize her success with intensive lifestyle modifications for her multiple health conditions.   Objective:   Blood pressure 140/62, pulse 71, temperature 97.9 F (36.6 C), height '5\' 1"'$  (1.549 m), weight 233 lb (105.7 kg), SpO2  98 %. Body mass index is 44.02 kg/m.  General: Cooperative, alert, well developed, in no acute distress. HEENT: Conjunctivae and lids unremarkable. Cardiovascular: Regular rhythm.  Lungs: Normal work of breathing. Neurologic: No focal deficits.    Lab Results  Component Value Date   CREATININE 0.53 04/20/2020   BUN 15 04/20/2020   NA 137 04/20/2020   K 4.3 04/20/2020   CL 101 04/20/2020   CO2 26 04/20/2020   Lab Results  Component Value Date   ALT 15 04/20/2020   AST 21 04/20/2020   ALKPHOS 49 04/20/2020   BILITOT 0.8 04/20/2020   Lab Results  Component Value Date   HGBA1C 6.4 04/20/2020   HGBA1C 6.1 (H) 10/16/2019   HGBA1C 6.1 (H) 07/01/2019   HGBA1C 6.6 (H) 12/31/2018   HGBA1C 6.3 08/29/2018   Lab Results  Component Value Date   INSULIN 21.1 07/01/2019   INSULIN 24.2 09/28/2017   INSULIN 17.6 06/21/2017   Lab Results  Component Value Date   TSH 1.860 07/01/2019   Lab Results  Component Value Date   CHOL 204 (H) 04/20/2020   HDL 51.00 04/20/2020   LDLCALC 117 (H) 10/16/2019   LDLDIRECT 122.0 04/20/2020   TRIG 207.0 (H) 04/20/2020   CHOLHDL 4 04/20/2020   Lab Results  Component Value Date   VD25OH 38.16 04/20/2020   VD25OH 39.9 07/01/2019   VD25OH 31.2 03/06/2019   Lab Results  Component Value Date   WBC 6.3 04/20/2020   HGB 13.0 04/20/2020   HCT 39.2 04/20/2020   MCV 89.4 04/20/2020   PLT 269.0 04/20/2020   No results found for: IRON, TIBC, FERRITIN  Obesity Behavioral Intervention:   Approximately 15 minutes were spent on the discussion below.  ASK: We discussed the diagnosis of obesity with Jasmin [name] today and Jasmin [name] agreed to give [name] permission to discuss obesity behavioral modification therapy today.  ASSESS: Jasmin [name] has the diagnosis of obesity and her BMI today is 44.1. Jasmin [name] is in the action stage of change.   ADVISE: Jasmin [name] was educated on the multiple health risks of obesity as well as the benefit of weight loss to improve her health. She was advised of the need for long term treatment and the importance of lifestyle modifications to improve her current health and to decrease her risk of future health problems.  AGREE: Multiple dietary modification options and treatment  options were discussed and Jasmin [name] agreed to follow the recommendations documented in the above note.  ARRANGE: Jasmin [name] was educated on the importance of frequent visits to treat obesity as outlined per CMS and USPSTF guidelines and agreed to schedule her next follow up appointment today.  Attestation Statements:   Reviewed by clinician on day of visit: allergies, medications, problem list, medical history, surgical history, family history, social history, and previous encounter notes.   I, Trixie Dredge, am acting as transcriptionist for Dennard Nip, MD.  I have reviewed the above documentation for accuracy and completeness, and I agree with the above. -  Dennard Nip, MD

## 2020-10-15 NOTE — Progress Notes (Addendum)
[facility] [place], [place], [place] [address] [phone]  Date:  10/19/2020   Name:  Jasmin [name]   DOB:  09/30/1945   MRN:  [id number]  PCP:  Darreld Mclean, MD    Chief Complaint: Annual Exam   History of Present Illness:  Jasmin [name] is a 75 y.o. very pleasant female patient who presents with the following:  History of well-controlled diabetes, hypertension, hyperlipidemia, stroke, OSA, obesity Patient seen today for follow-up Most recent visit with myself was in February for CPE She has been [facility]y [facility]-most recent visit in July At that point she had lost about 15 pounds so far- however she does seem to have gained some of the weight back  She is walking for exercise  She is still bothered some by balance difficulty  Wt Readings from Last 3 Encounters:  10/19/20 239 lb (108.4 kg)  10/14/20 233 lb (105.7 kg)  09/03/20 232 lb (105.2 kg)   Pulmonologist is Dr. Abbe Amsterdam annually for her CPAP [facility] Dr. Drexel Iha prolapse and urinary incontinence.  Most recent visit in April They are talking about doing some surgery for her again but she is not sure - spinal cord stimulator.  We discussed this today She has significant incontinence symptoms, is taking several medications during the day and DDAVP at night  Never smoker, moderate alcohol  COVID booster- she has had 4 doses so far  Can update A1c ?  Is she also taking aspirin due to history of stroke-she reports she is no longer taking aspirin, she was told to stop this  Wellbutrin Keflex daily-UTI preventative Plavix DDAVP at bedtime Myrbetriq Low vase a 2 capsules twice daily Simvastatin Losartan 75 Metformin 500 twice daily  She did some PT last year to help with falls - she had a couple of falls last year and did some PT  She goes to a [facility] physical  therapy practice and sees Azell Der- she will send me this info so I can place a referral  She notes a small knot on her scalp which has been present for about 6 months.  She did not think too much of it, but then recently scraped it and it scabbed over.  She is not sure if there is any cause for concern She is wearing a boot on her left leg to help with ankle pain -this is per her orthopedist.  However, she notes that since she started wearing the boot she is having significant calf cramping and pain at night. Patient Active Problem List   Diagnosis Date Noted   BMI 45.0-49.9, adult ([place]) 07/31/2020   Swelling of both lower extremities    Shortness of breath on exertion    Red eyes    Nasal discharge    Leg cramping    Knee pain    Joint pain    Easy bruising    Dry mouth    Decreased hearing    Back pain    Increased frequency of urination 12/26/2018   Nocturia 12/26/2018   Recurrent UTI 12/26/2018   Vaginal atrophy 12/26/2018   Hyperopia with astigmatism and presbyopia, bilateral 02/09/2018   Long term current use of oral hypoglycemic drug 02/09/2018   PCO (posterior capsular opacification), left 02/09/2018   Pseudophakia of left eye 02/09/2018   PVD (posterior vitreous detachment), both eyes 02/09/2018   Cough  03/17/2017   Arthritis 12/16/2016   UTI (urinary tract infection)    Stroke [facility])    Sleep apnea    Right kidney stone    Hypertension    Hyperlipidemia    Heart murmur    Diabetes mellitus without complication ([place])    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   Cystocele with rectocele 08/21/2016   Mixed hyperlipidemia 08/08/2016   Controlled type 2 diabetes mellitus without complication, without long-term current use of insulin ([place]) 08/08/2016   Essential hypertension 08/08/2016   History of CVA (cerebrovascular accident) 08/08/2016   Nuclear sclerotic cataract of left eye 07/07/2016   OAB (overactive bladder) 01/20/2016   Morbid obesity ([place])  11/10/2015   Contusion of right knee 09/09/2015   Quadriceps weakness 09/09/2015   Right shoulder pain 08/21/2015   IBS (irritable bowel syndrome) 07/16/2015   Insomnia 07/16/2015   Left ventricular hypertrophy 07/16/2015   Pulmonary hypertension ([place]) 07/16/2015   Primary osteoarthritis of left knee 03/31/2015   Acid reflux disease 03/09/2015   Aortic stenosis 03/09/2015   AR (allergic rhinitis) 03/09/2015   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   OSA (obstructive sleep apnea) 03/09/2015   Osteoporosis 03/09/2015   Vitamin D deficiency 03/09/2015   Left tibialis posterior tendinitis 12/13/2013   Aftercare following right knee joint replacement surgery 12/04/2013   Sprain of ankle, left 11/15/2013   Overweight 08/16/2013   Broken bones 08/13/2013    Past Medical History:  Diagnosis Date   Acid reflux disease 03/09/2015   Aortic stenosis 03/09/2015   AR (allergic rhinitis) 03/09/2015   Arthritis    Back pain    Controlled type 2 diabetes mellitus without complication, without long-term current use of insulin ([place]) 08/08/2016   Cough    Cystocele with rectocele 08/21/2016   Decreased hearing    Diabetes mellitus without complication (HCC)    Dry mouth    Easy bruising    Essential hypertension 08/08/2016   Heart murmur    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   History of CVA (cerebrovascular accident) 08/08/2016   Seen on MRI from 08/2015   Hyperlipidemia    Hypertension    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   IBS (irritable bowel syndrome) 07/16/2015   Insomnia 07/16/2015   Joint pain    Knee pain    Left ventricular hypertrophy 07/16/2015   Leg cramping    right leg   Mixed hyperlipidemia 08/08/2016   Morbid obesity ([place]) 11/10/2015   Nasal discharge    Nuclear sclerotic cataract of left eye 07/07/2016   OAB (overactive bladder) 01/20/2016   OSA (obstructive sleep apnea) 03/09/2015   Osteoporosis 03/09/2015   Primary osteoarthritis of left knee 03/31/2015    Pulmonary hypertension ([place]) 07/16/2015   Red eyes    Right kidney stone    Shortness of breath on exertion    Sleep apnea    Stroke ([place])    Swelling of both lower extremities    UTI (urinary tract infection)    Vitamin D deficiency 03/09/2015    Past Surgical History:  Procedure Laterality Date   ABDOMINAL HYSTERECTOMY  09/14/2016   CATARACT EXTRACTION Left    CATARACT EXTRACTION Left 06/2016   COLPORRHAPHY N/A 09/14/2016   [facility]   CYSTOCELE REPAIR     FRACTURE SURGERY Left 1953   L arm   FRACTURE SURGERY Left 1985   L ankle   HERNIA REPAIR  2004   JOINT REPLACEMENT Right 2012   R TKA   JOINT REPLACEMENT Left  2017   KIDNEY STONE SURGERY     LAPAROSCOPIC ASSISTED VAGINAL HYSTERECTOMY N/A 09/14/2016   [facility]   R foot/ankle Right 2013 and 2014   plate and screws lateral foot and tendon removal medial foot in 2013, revision in 2014   [place]     URETEROSCOPY      Social History   Tobacco Use   Smoking status: Never   Smokeless tobacco: Never  Vaping Use   Vaping Use: Never used  Substance Use Topics   Alcohol use: Yes    Alcohol/week: 2.0 - 3.0 standard drinks    Types: 2 - 3 Glasses of wine per week    Comment: couple of glasses per week   Drug use: No    Family History  Problem Relation Age of Onset   Hyperlipidemia Mother    Heart disease Mother    Hypertension Mother    Stroke Mother    Thyroid disease Mother    Hypertension Father    Heart disease Father     Allergies  Allergen Reactions   Latex Itching   Ciprofloxacin Hives   Metronidazole Hives   Adhesive [Tape] Rash    Medication list has been reviewed and updated.  Current Outpatient Medications on File Prior to Visit  Medication Sig Dispense Refill   Accu-Chek Softclix Lancets lancets USE AS DIRECTED TO CHECK BLOOD GLUCOSE ONCE DAILY 100 each 3   amoxicillin (AMOXIL) 500 MG capsule Take prior to dentist procedure     blood glucose meter kit and  supplies KIT Dispense based on patient and insurance preference. Use once daily to monitor glucose as needed (FOR ICD-9 250.00, 250.01). 1 each 0   Blood Glucose Monitoring Suppl (ACCU-CHEK GUIDE) w/Device KIT USE AS DIRECTED ONCE DAILY TO CHECK BLOOD GLUCOSE 1 kit 3   buPROPion (WELLBUTRIN SR) 150 MG 12 hr tablet TAKE 1 TABLET (150 MG TOTAL) BY MOUTH DAILY. 30 tablet 0   CALCIUM PO Take 1 tablet by mouth daily.     cephALEXin (KEFLEX) 250 MG capsule Take 1 capsule (250 mg total) by mouth daily. (Patient taking differently: at bedtime.) 90 capsule 1   Cholecalciferol (VITAMIN D3) 2000 units TABS Take by mouth. Take one tablet in the AM     clopidogrel (PLAVIX) 75 MG tablet Take 1 tablet (75 mg total) by mouth daily. 90 tablet 1   cycloSPORINE (RESTASIS) 0.05 % ophthalmic emulsion Place 1 drop into both eyes 2 times daily. 180 each 1   desmopressin (DDAVP) 0.2 MG tablet Take 3 tablets by mouth nightly 90 tablet 3   desonide (DESOWEN) 0.05 % cream Apply a thin layer to the affected area twice daily for 2 weeks then stop for 1 week. Repeat for flares. 60 g 0   econazole nitrate 1 % cream Apply topically as needed.      fluticasone (FLONASE) 50 MCG/ACT nasal spray Place 2 sprays into both nostrils daily. 48 g 3   glucose blood test strip USE AS DIRECTED TO CHECK BLOOD GLUCOSE ONCE DAILY 100 strip 3   losartan (COZAAR) 50 MG tablet Take 1 & 1/2 tablets (75 mg total) by mouth daily. 135 tablet 3   meloxicam (MOBIC) 15 MG tablet Take 15 mg by mouth daily.     metFORMIN (GLUCOPHAGE) 500 MG tablet TAKE 1 TABLET (500 MG TOTAL) BY MOUTH 2 (TWO) TIMES DAILY WITH A MEAL. 180 tablet 3   miconazole (MICOTIN) 2 % cream Apply 1  application topically 2 (two) times daily.     mirabegron ER (MYRBETRIQ) 50 MG TB24 tablet Take 1 tablet (50 mg total) by mouth daily. 90 tablet 3   omega-3 acid ethyl esters (LOVAZA) 1 g capsule TAKE 2 CAPSULES BY MOUTH TWICE DAILY 360 capsule 0   omeprazole (PRILOSEC) 40 MG capsule TAKE 1  CAPSULE (40 MG TOTAL) BY MOUTH DAILY FOR 30 DAYS. 90 capsule 0   oxyCODONE-acetaminophen (PERCOCET) 5-325 MG tablet Take 1 tablet by mouth every 8 (eight) hours as needed for severe pain. 15 tablet 0   Probiotic Product (PROBIOTIC-10 PO) Take 1 capsule by mouth daily.     PSYLLIUM PO Take by mouth.     simvastatin (ZOCOR) 20 MG tablet TAKE 1 TABLET (20 MG TOTAL) BY MOUTH AT BEDTIME. 90 tablet 3   solifenacin (VESICARE) 10 MG tablet Take 1 tablet (10 mg total) by mouth daily. 30 tablet 1   Vitamin D, Ergocalciferol, (DRISDOL) 1.25 MG (50000 UNIT) CAPS capsule TAKE 1 CAPSULE (50,000 UNITS TOTAL) BY MOUTH EVERY 7 (SEVEN) DAYS. 4 capsule 0   No current facility-administered medications on file prior to visit.    Review of Systems:  As per HPI- otherwise negative.   Physical Examination: Vitals:   10/19/20 0856  BP: 136/72  Pulse: 86  Resp: 18  Temp: (!) 97.4 F (36.3 C)  SpO2: 97%   Vitals:   10/19/20 0856  Weight: 239 lb (108.4 kg)  Height: _0  (1.549 m)   Body mass index is 45.16 kg/m. Ideal Body Weight: Weight in (lb) to have BMI = 25: 132  GEN: no acute distress.  Obese, looks well HEENT: Atraumatic, Normocephalic.  Ears and Nose: No external deformity. CV: RRR, No M/G/R. No JVD. No thrill. No extra heart sounds. PULM: CTA B, no wheezes, crackles, rhonchi. No retractions. No resp. distress. No accessory muscle use. ABD: S, NT, ND, +BS. No rebound. No HSM. EXTR: No c/c/e PSYCH: Normally interactive. Conversant.  Pes planus is present bilaterally.  Her left foot turns outward somewhat.  Her calf is soft, no cords or tenderness.  Strong pulse in the dorsal foot  Assessment and Plan: Type 2 diabetes mellitus without complication, without long-term current use of insulin (HCC) - Plan: Hemoglobin W9Q, Basic metabolic panel  Vitamin D deficiency  Obesity with current BMI 44.1  Essential hypertension  Mixed hyperlipidemia  Chronic pain of left ankle  Immunization  due - Plan: Flu vaccine HIGH DOSE PF (Fluzone High dose)  Calf cramp  Patient seen today for follow-up.  A1c and BMP pending to follow-up on diabetes She is working on weight loss, is [facility].  Offered encouragement Given flu shot today We discussed her calf cramping.  I will check her potassium and sodium.  However, the beginnings of her cramping seem to coincide with wearing a hard boot on her left leg.  She does not feel that the boot is helping her with her ankle pain.  I advised her that I am not privy to her orthopedic care details.  However, I do suspect that her boot may be causing her leg cramps  Plan to visit in about 6 months for physical This visit occurred during the SARS-CoV-2 public health emergency.  Safety protocols were in place, including screening questions prior to the visit, additional usage of staff PPE, and extensive cleaning of exam room while observing appropriate contact time as indicated for disinfecting solutions.   Signed Lamar Blinks, MD  Received her labs as below, message to patient  Results for orders placed or performed in visit on 10/19/20  Hemoglobin A1c  Result Value Ref Range   Hgb A1c MFr Bld 6.3 4.6 - 6.5 %  Basic metabolic panel  Result Value Ref Range   Sodium 138 135 - 145 mEq/L   Potassium 4.9 3.5 - 5.1 mEq/L   Chloride 102 96 - 112 mEq/L   CO2 27 19 - 32 mEq/L   Glucose, Bld 100 (H) 70 - 99 mg/dL   BUN 14 6 - 23 mg/dL   Creatinine, Ser 0.60 0.40 - 1.20 mg/dL   GFR 87.68 >60.00 mL/min   Calcium 9.5 8.4 - 10.5 mg/dL

## 2020-10-19 ENCOUNTER — Ambulatory Visit (INDEPENDENT_AMBULATORY_CARE_PROVIDER_SITE_OTHER): Payer: Medicare PPO

## 2020-10-19 ENCOUNTER — Encounter: Payer: Self-pay | Admitting: Family Medicine

## 2020-10-19 ENCOUNTER — Other Ambulatory Visit: Payer: Self-pay

## 2020-10-19 ENCOUNTER — Ambulatory Visit (INDEPENDENT_AMBULATORY_CARE_PROVIDER_SITE_OTHER): Payer: Medicare PPO | Admitting: Family Medicine

## 2020-10-19 ENCOUNTER — Other Ambulatory Visit (HOSPITAL_BASED_OUTPATIENT_CLINIC_OR_DEPARTMENT_OTHER): Payer: Self-pay

## 2020-10-19 VITALS — BP 136/72 | HR 86 | Temp 97.4°F | Resp 18 | Ht 61.0 in | Wt 239.0 lb

## 2020-10-19 VITALS — BP 136/72 | HR 86 | Resp 18 | Ht 61.0 in | Wt 239.0 lb

## 2020-10-19 DIAGNOSIS — Z0000 Encounter for general adult medical examination without abnormal findings: Secondary | ICD-10-CM | POA: Diagnosis not present

## 2020-10-19 DIAGNOSIS — G8929 Other chronic pain: Secondary | ICD-10-CM

## 2020-10-19 DIAGNOSIS — E559 Vitamin D deficiency, unspecified: Secondary | ICD-10-CM

## 2020-10-19 DIAGNOSIS — R252 Cramp and spasm: Secondary | ICD-10-CM | POA: Diagnosis not present

## 2020-10-19 DIAGNOSIS — I10 Essential (primary) hypertension: Secondary | ICD-10-CM

## 2020-10-19 DIAGNOSIS — E782 Mixed hyperlipidemia: Secondary | ICD-10-CM | POA: Diagnosis not present

## 2020-10-19 DIAGNOSIS — Z23 Encounter for immunization: Secondary | ICD-10-CM

## 2020-10-19 DIAGNOSIS — Z6841 Body Mass Index (BMI) 40.0 and over, adult: Secondary | ICD-10-CM | POA: Diagnosis not present

## 2020-10-19 DIAGNOSIS — M25572 Pain in left ankle and joints of left foot: Secondary | ICD-10-CM | POA: Diagnosis not present

## 2020-10-19 DIAGNOSIS — E119 Type 2 diabetes mellitus without complications: Secondary | ICD-10-CM

## 2020-10-19 DIAGNOSIS — E66813 Obesity, class 3: Secondary | ICD-10-CM

## 2020-10-19 LAB — BASIC METABOLIC PANEL
BUN: 14 mg/dL (ref 6–23)
CO2: 27 mEq/L (ref 19–32)
Calcium: 9.5 mg/dL (ref 8.4–10.5)
Chloride: 102 mEq/L (ref 96–112)
Creatinine, Ser: 0.6 mg/dL (ref 0.40–1.20)
GFR: 87.68 mL/min (ref >60.00)
Glucose, Bld: 100 mg/dL — ABNORMAL HIGH (ref 70–99)
Potassium: 4.9 mEq/L (ref 3.5–5.1)
Sodium: 138 mEq/L (ref 135–145)

## 2020-10-19 LAB — HEMOGLOBIN A1C: Hgb A1c MFr Bld: 6.3 % (ref 4.6–6.5)

## 2020-10-19 MED ORDER — BOOSTRIX 5-2.5-18.5 LF-MCG/0.5 IM SUSY
PREFILLED_SYRINGE | INTRAMUSCULAR | 0 refills | Status: DC
  Filled 2020-10-19 (×2): qty 0.5, 1d supply, fill #0

## 2020-10-19 NOTE — Patient Instructions (Addendum)
It was good to see you again today- I will be in touch with your labs asap Please let me know if you would like me to set you up for PT Flu shot given today Assuming all is well please see me in about 6 months for a physical

## 2020-10-19 NOTE — Patient Instructions (Signed)
Jasmin [name] , Thank you for taking time to come for your Medicare Wellness Visit. I appreciate your ongoing commitment to your health goals. Please review the following plan we discussed and let me know if I can assist you in the future.   Screening recommendations/referrals: Colonoscopy: Completed 07/16/2015-No longer required for screening purposes Mammogram: Completed 05/18/2020-Due 05/18/2021 Bone Density: Completed 05/18/2020-Due 05/19/2022 Recommended yearly ophthalmology/optometry visit for glaucoma screening and checkup Recommended yearly dental visit for hygiene and checkup  Vaccinations: Influenza vaccine: Completed at today's visit Pneumococcal vaccine: Up to date Tdap vaccine: Due -11/09/2020-Discuss with pharmacy Shingles vaccine: Completed vaccines   Covid-19:Up to date  Advanced directives: Please bring a copy for your chart  Conditions/risks identified: See problem list  Next appointment: Follow up in one year for your annual wellness visit    Preventive Care 65 Years and Older, Female Preventive care refers to lifestyle choices and visits with your health care provider that can promote health and wellness. What does preventive care include? A yearly physical exam. This is also called an annual well check. Dental exams once or twice a year. Routine eye exams. Ask your health care provider how often you should have your eyes checked. Personal lifestyle choices, including: Daily care of your teeth and gums. Regular physical activity. Eating a healthy diet. Avoiding tobacco and drug use. Limiting alcohol use. Practicing safe sex. Taking low-dose aspirin every day. Taking vitamin and mineral supplements as recommended by your health care provider. What happens during an annual well check? The services and screenings done by your health care provider during your annual well check will depend on your age, overall health, lifestyle risk factors, and family history of  disease. Counseling  Your health care provider may ask you questions about your: Alcohol use. Tobacco use. Drug use. Emotional well-being. Home and relationship well-being. Sexual activity. Eating habits. History of falls. Memory and ability to understand (cognition). Work and work Statistician. Reproductive health. Screening  You may have the following tests or measurements: Height, weight, and BMI. Blood pressure. Lipid and cholesterol levels. These may be checked every 5 years, or more frequently if you are over 101 years old. Skin check. Lung cancer screening. You may have this screening every year starting at age 32 if you have a 30-pack-year history of smoking and currently smoke or have quit within the past 15 years. Fecal occult blood test (FOBT) of the stool. You may have this test every year starting at age 26. Flexible sigmoidoscopy or colonoscopy. You may have a sigmoidoscopy every 5 years or a colonoscopy every 10 years starting at age 74. Hepatitis C blood test. Hepatitis B blood test. Sexually transmitted disease (STD) testing. Diabetes screening. This is done by checking your blood sugar (glucose) after you have not eaten for a while (fasting). You may have this done every 1-3 years. Bone density scan. This is done to screen for osteoporosis. You may have this done starting at age 18. Mammogram. This may be done every 1-2 years. Talk to your health care provider about how often you should have regular mammograms. Talk with your health care provider about your test results, treatment options, and if necessary, the need for more tests. Vaccines  Your health care provider may recommend certain vaccines, such as: Influenza vaccine. This is recommended every year. Tetanus, diphtheria, and acellular pertussis (Tdap, Td) vaccine. You may need a Td booster every 10 years. Zoster vaccine. You may need this after age 90. Pneumococcal 13-valent conjugate (PCV13) vaccine. One  dose is recommended after age 35. Pneumococcal polysaccharide (PPSV23) vaccine. One dose is recommended after age 4. Talk to your health care provider about which screenings and vaccines you need and how often you need them. This information is not intended to replace advice given to you by your health care provider. Make sure you discuss any questions you have with your health care provider. Document Released: 03/06/2015 Document Revised: 10/28/2015 Document Reviewed: 12/09/2014 Elsevier Interactive Patient Education  2017 [place] Prevention in the Home Falls can cause injuries. They can happen to people of all ages. There are many things you can do to make your home safe and to help prevent falls. What can I do on the outside of my home? Regularly fix the edges of walkways and driveways and fix any cracks. Remove anything that might make you trip as you walk through a door, such as a raised step or threshold. Trim any bushes or trees on the path to your home. Use bright outdoor lighting. Clear any walking paths of anything that might make someone trip, such as rocks or tools. Regularly check to see if handrails are loose or broken. Make sure that both sides of any steps have handrails. Any raised decks and porches should have guardrails on the edges. Have any leaves, snow, or ice cleared regularly. Use sand or salt on walking paths during winter. Clean up any spills in your garage right away. This includes oil or grease spills. What can I do in the bathroom? Use night lights. Install grab bars by the toilet and in the tub and shower. Do not use towel bars as grab bars. Use non-skid mats or decals in the tub or shower. If you need to sit down in the shower, use a plastic, non-slip stool. Keep the floor dry. Clean up any water that spills on the floor as soon as it happens. Remove soap buildup in the tub or shower regularly. Attach bath mats securely with double-sided  non-slip rug tape. Do not have throw rugs and other things on the floor that can make you trip. What can I do in the bedroom? Use night lights. Make sure that you have a light by your bed that is easy to reach. Do not use any sheets or blankets that are too big for your bed. They should not hang down onto the floor. Have a firm chair that has side arms. You can use this for support while you get dressed. Do not have throw rugs and other things on the floor that can make you trip. What can I do in the kitchen? Clean up any spills right away. Avoid walking on wet floors. Keep items that you use a lot in easy-to-reach places. If you need to reach something above you, use a strong step stool that has a grab bar. Keep electrical cords out of the way. Do not use floor polish or wax that makes floors slippery. If you must use wax, use non-skid floor wax. Do not have throw rugs and other things on the floor that can make you trip. What can I do with my stairs? Do not leave any items on the stairs. Make sure that there are handrails on both sides of the stairs and use them. Fix handrails that are broken or loose. Make sure that handrails are as long as the stairways. Check any carpeting to make sure that it is firmly attached to the stairs. Fix any carpet that is loose or worn. Avoid  having throw rugs at the top or bottom of the stairs. If you do have throw rugs, attach them to the floor with carpet tape. Make sure that you have a light switch at the top of the stairs and the bottom of the stairs. If you do not have them, ask someone to add them for you. What else can I do to help prevent falls? Wear shoes that: Do not have high heels. Have rubber bottoms. Are comfortable and fit you well. Are closed at the toe. Do not wear sandals. If you use a stepladder: Make sure that it is fully opened. Do not climb a closed stepladder. Make sure that both sides of the stepladder are locked into place. Ask  someone to hold it for you, if possible. Clearly mark and make sure that you can see: Any grab bars or handrails. First and last steps. Where the edge of each step is. Use tools that help you move around (mobility aids) if they are needed. These include: Canes. Walkers. Scooters. Crutches. Turn on the lights when you go into a dark area. Replace any light bulbs as soon as they burn out. Set up your furniture so you have a clear path. Avoid moving your furniture around. If any of your floors are uneven, fix them. If there are any pets around you, be aware of where they are. Review your medicines with your doctor. Some medicines can make you feel dizzy. This can increase your chance of falling. Ask your doctor what other things that you can do to help prevent falls. This information is not intended to replace advice given to you by your health care provider. Make sure you discuss any questions you have with your health care provider. Document Released: 12/04/2008 Document Revised: 07/16/2015 Document Reviewed: 03/14/2014 Elsevier Interactive Patient Education  2017 [employer].

## 2020-10-19 NOTE — Progress Notes (Signed)
Subjective:   Jasmin [name] is a 75 y.o. female who presents for Medicare Annual (Subsequent) preventive examination.  Review of Systems     Cardiac Risk Factors include: advanced age (>58mn, >>73women);diabetes mellitus;dyslipidemia;hypertension;obesity (BMI >30kg/m2)     Objective:    Today's Vitals   10/19/20 0958  BP: 136/72  Pulse: 86  Resp: 18  SpO2: 97%  Weight: 239 lb (108.4 kg)  Height: 5' 1"  (1.549 m)   Body mass index is 45.16 kg/m.  Advanced Directives 10/19/2020 08/14/2018 05/25/2017 10/02/2016 09/24/2015 06/03/2014  Does Patient Have a Medical Advance Directive? Yes Yes Yes No Yes Yes  Type of AParamed[place]of [place] will [place] will [place] will - Living will;Healthcare Power of [place] Does patient want to make changes to medical advance directive? - No - Patient declined - - - No - Patient declined  Copy of HF[place]in Chart? No - copy requested No - copy requested No - copy requested - - No - copy requested  Would patient like information on creating a medical advance directive? - - - Yes (ED - Information included in AVS) - -    Current Medications (verified) Outpatient Encounter Medications as of 10/19/2020  Medication Sig   Accu-Chek Softclix Lancets lancets USE AS DIRECTED TO CHECK BLOOD GLUCOSE ONCE DAILY   amoxicillin (AMOXIL) 500 MG capsule Take prior to dentist procedure   blood glucose meter kit and supplies KIT Dispense based on patient and insurance preference. Use once daily to monitor glucose as needed (FOR ICD-9 250.00, 250.01).   Blood Glucose Monitoring Suppl (ACCU-CHEK GUIDE) w/Device KIT USE AS DIRECTED ONCE DAILY TO CHECK BLOOD GLUCOSE   buPROPion (WELLBUTRIN SR) 150 MG 12 hr tablet TAKE 1 TABLET (150 MG TOTAL) BY MOUTH DAILY.   CALCIUM PO Take 1 tablet by mouth daily.   cephALEXin (KEFLEX) 250 MG capsule  Take 1 capsule (250 mg total) by mouth daily. (Patient taking differently: at bedtime.)   Cholecalciferol (VITAMIN D3) 2000 units TABS Take by mouth. Take one tablet in the AM   clopidogrel (PLAVIX) 75 MG tablet Take 1 tablet (75 mg total) by mouth daily.   cycloSPORINE (RESTASIS) 0.05 % ophthalmic emulsion Place 1 drop into both eyes 2 times daily.   desmopressin (DDAVP) 0.2 MG tablet Take 3 tablets by mouth nightly   desonide (DESOWEN) 0.05 % cream Apply a thin layer to the affected area twice daily for 2 weeks then stop for 1 week. Repeat for flares.   econazole nitrate 1 % cream Apply topically as needed.    fluticasone (FLONASE) 50 MCG/ACT nasal spray Place 2 sprays into both nostrils daily.   glucose blood test strip USE AS DIRECTED TO CHECK BLOOD GLUCOSE ONCE DAILY   losartan (COZAAR) 50 MG tablet Take 1 & 1/2 tablets (75 mg total) by mouth daily.   meloxicam (MOBIC) 15 MG tablet Take 15 mg by mouth daily.   metFORMIN (GLUCOPHAGE) 500 MG tablet TAKE 1 TABLET (500 MG TOTAL) BY MOUTH 2 (TWO) TIMES DAILY WITH A MEAL.   miconazole (MICOTIN) 2 % cream Apply 1 application topically 2 (two) times daily.   mirabegron ER (MYRBETRIQ) 50 MG TB24 tablet Take 1 tablet (50 mg total) by mouth daily.   omega-3 acid ethyl esters (LOVAZA) 1 g capsule TAKE 2 CAPSULES BY MOUTH TWICE DAILY   omeprazole (PRILOSEC) 40 MG capsule TAKE 1 CAPSULE (40 MG TOTAL)  BY MOUTH DAILY FOR 30 DAYS.   oxyCODONE-acetaminophen (PERCOCET) 5-325 MG tablet Take 1 tablet by mouth every 8 (eight) hours as needed for severe pain.   Probiotic Product (PROBIOTIC-10 PO) Take 1 capsule by mouth daily.   PSYLLIUM PO Take by mouth.   simvastatin (ZOCOR) 20 MG tablet TAKE 1 TABLET (20 MG TOTAL) BY MOUTH AT BEDTIME.   solifenacin (VESICARE) 10 MG tablet Take 1 tablet (10 mg total) by mouth daily.   Vitamin D, Ergocalciferol, (DRISDOL) 1.25 MG (50000 UNIT) CAPS capsule TAKE 1 CAPSULE (50,000 UNITS TOTAL) BY MOUTH EVERY 7 (SEVEN) DAYS.   No  facility-administered encounter medications on file as of 10/19/2020.    Allergies (verified) Latex, Ciprofloxacin, Metronidazole, and Adhesive [tape]   History: Past Medical History:  Diagnosis Date   Acid reflux disease 03/09/2015   Aortic stenosis 03/09/2015   AR (allergic rhinitis) 03/09/2015   Arthritis    Back pain    Controlled type 2 diabetes mellitus without complication, without long-term current use of insulin ([place]) 08/08/2016   Cough    Cystocele with rectocele 08/21/2016   Decreased hearing    Diabetes mellitus without complication (HCC)    Dry mouth    Easy bruising    Essential hypertension 08/08/2016   Heart murmur    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   History of CVA (cerebrovascular accident) 08/08/2016   Seen on MRI from 08/2015   Hyperlipidemia    Hypertension    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   IBS (irritable bowel syndrome) 07/16/2015   Insomnia 07/16/2015   Joint pain    Knee pain    Left ventricular hypertrophy 07/16/2015   Leg cramping    right leg   Mixed hyperlipidemia 08/08/2016   Morbid obesity ([place]) 11/10/2015   Nasal discharge    Nuclear sclerotic cataract of left eye 07/07/2016   OAB (overactive bladder) 01/20/2016   OSA (obstructive sleep apnea) 03/09/2015   Osteoporosis 03/09/2015   Primary osteoarthritis of left knee 03/31/2015   Pulmonary hypertension ([place]) 07/16/2015   Red eyes    Right kidney stone    Shortness of breath on exertion    Sleep apnea    Stroke ([place])    Swelling of both lower extremities    UTI (urinary tract infection)    Vitamin D deficiency 03/09/2015   Past Surgical History:  Procedure Laterality Date   ABDOMINAL HYSTERECTOMY  09/14/2016   CATARACT EXTRACTION Left    CATARACT EXTRACTION Left 06/2016   COLPORRHAPHY N/A 09/14/2016   [facility]   CYSTOCELE REPAIR     FRACTURE SURGERY Left 1953   L arm   FRACTURE SURGERY Left 1985   L ankle   HERNIA REPAIR  2004   JOINT REPLACEMENT Right 2012   R TKA   JOINT  REPLACEMENT Left 2017   KIDNEY STONE SURGERY     LAPAROSCOPIC ASSISTED VAGINAL HYSTERECTOMY N/A 09/14/2016   [facility]   R foot/ankle Right 2013 and 2014   plate and screws lateral foot and tendon removal medial foot in 2013, revision in 2014   [place]     URETEROSCOPY     Family History  Problem Relation Age of Onset   Hyperlipidemia Mother    Heart disease Mother    Hypertension Mother    Stroke Mother    Thyroid disease Mother    Hypertension Father    Heart disease Father    Social History   Socioeconomic  History   Marital status: Married    Spouse name: Tyrene Nader   Number of children: 1   Years of education: Not on file   Highest education level: Not on file  Occupational History   Occupation: Retired Pharmacist, hospital  Tobacco Use   Smoking status: Never   Smokeless tobacco: Never  Vaping Use   Vaping Use: Never used  Substance and Sexual Activity   Alcohol use: Yes    Alcohol/week: 2.0 - 3.0 standard drinks    Types: 2 - 3 Glasses of wine per week    Comment: couple of glasses per week   Drug use: No   Sexual activity: Not Currently  Other Topics Concern   Not on file  Social History Narrative   Not on file   Social Determinants of Health   Financial Resource Strain: Low Risk    Difficulty of Paying Living Expenses: Not hard at all  Food Insecurity: No Food Insecurity   Worried About Charity fundraiser in the Last Year: Never true   Ran Out of Food in the Last Year: Never true  Transportation Needs: No Transportation Needs   Lack of Transportation (Medical): No   Lack of Transportation (Non-Medical): No  Physical Activity: Sufficiently Active   Days of Exercise per Week: 5 days   Minutes of Exercise per Session: 30 min  Stress: No Stress Concern Present   Feeling of Stress : Not at all  Social Connections: Socially Integrated   Frequency of Communication with Friends and Family: More than three times a week    Frequency of Social Gatherings with Friends and Family: More than three times a week   Attends Religious Services: More than 4 times per year   Active Member of [employer] or Organizations: Yes   Attends Music therapist: More than 4 times per year   Marital Status: Married    Tobacco Counseling Counseling given: Not Answered   Clinical Intake:  Pre-visit preparation completed: Yes  Pain : No/denies pain     Nutritional Status: BMI > 30  Obese Nutritional Risks: None Diabetes: Yes CBG done?: No Did pt. bring in CBG monitor from home?: No  How often do you need to have someone help you when you read instructions, pamphlets, or other written materials from your doctor or pharmacy?: 1 - Never  Diabetes:  Is the patient diabetic?  Yes  If diabetic, was a CBG obtained today?  No  Did the patient bring in their glucometer from home?  No  How often do you monitor your CBG's? daily.   Financial Strains and Diabetes Management:  Are you having any financial strains with the device, your supplies or your medication? No .  Does the patient want to be seen by Chronic Care Management for management of their diabetes?  No  Would the patient like to be referred to a Nutritionist or for Diabetic Management?  No   Diabetic Exams:  Diabetic Eye Exam: Completed 03/02/2020.   Diabetic Foot Exam: Completed 04/20/2020.   Interpreter Needed?: No  Information entered by :: Caroleen Hamman LPN   Activities of Daily Living In your present state of health, do you have any difficulty performing the following activities: 10/19/2020  Hearing? N  Vision? N  Difficulty concentrating or making decisions? N  Walking or climbing stairs? N  Dressing or bathing? N  Doing errands, shopping? N  Preparing Food and eating ? N  Using the Toilet? N  In the past  six months, have you accidently leaked urine? Y  Do you have problems with loss of bowel control? N  Managing your Medications? N   Managing your Finances? N  Housekeeping or managing your Housekeeping? N  Some recent data might be hidden    Patient Care Team: Copland, Gay Filler, MD as PCP - General (Family Medicine) Charlaine Dalton, MD as Consulting Physician (Pulmonary Disease) Donette Larry, MD as Consulting Physician (Orthopedic Surgery) Hollar, Katharine Look, MD as Consulting Physician (Dermatology) Janeann Forehand, MD as Consulting Physician (Sports Medicine) Puschinsky, Fransico Him., MD as Consulting Physician (General Surgery) Pill, Barnabas Lister, MD as Consulting Physician (Orthopedic Surgery) Merilynn Finland., DDS as Consulting Physician (Dentistry) Frazier Butt., MD (Obstetrics and Gynecology) Marti Sleigh, MD as Referring Physician (Gynecology)  Indicate any recent Medical Services you may have received from other than [facility] providers in the past year (date may be approximate).     Assessment:   This is a routine wellness examination for Merilynn.  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 Trinna Post  Dietary issues and exercise activities discussed: Current Exercise Habits: Home exercise routine, Type of exercise: walking, Time (Minutes): 30, Frequency (Times/Week): 5, Weekly Exercise (Minutes/Week): 150, Intensity: Mild, Exercise limited by: None identified   Goals Addressed   None    Depression Screen PHQ 2/9 Scores 10/19/2020 10/19/2020 08/14/2018 01/31/2018 01/31/2018 06/21/2017 05/25/2017  PHQ - 2 Score 0 0 0 0 0 4 0  PHQ- 9 Score - - - - - 15 -  Exception Documentation - - - - - - -    Fall Risk Fall Risk  10/19/2020 10/19/2020 10/16/2019 08/14/2018 01/31/2018  Falls in the past year? 0 0 0 0 0  Number falls in past yr: 0 - - - -  Injury with Fall? 0 - - - -  Follow up Falls prevention discussed - - - -    FALL RISK PREVENTION PERTAINING TO THE HOME:  Any stairs in  or around the home? Yes  If so, are there any without handrails? No  Home free of loose throw rugs in walkways, pet beds, electrical cords, etc? Yes  Adequate lighting in your home to reduce risk of falls? Yes   ASSISTIVE DEVICES UTILIZED TO PREVENT FALLS:  Life alert? No  Use of a cane, walker or w/c? No  Grab bars in the bathroom? No  Shower chair or bench in shower? No  Elevated toilet seat or a handicapped toilet? No   TIMED UP AND GO:  Was the test performed? Yes .  Length of time to ambulate 10 feet: 11 sec.   Gait steady and fast without use of assistive device  Cognitive Function:Normal cognitive status assessed by direct observation by this Nurse Health Advisor. No abnormalities found.   MMSE - Mini Mental State Exam 05/25/2017  Orientation to time 5  Orientation to Place 5  Registration 3  Attention/ Calculation 5  Recall 3  Language- name 2 objects 2  Language- repeat 1  Language- follow 3 step command 3  Language- read & follow direction 1  Write a sentence 1  Copy design 1  Total score 30        Immunizations Immunization History  Administered Date(s) Administered   Fluad Quad(high Dose 65+) 10/23/2018   Influenza Split 11/17/2014   Influenza, High Dose Seasonal PF 12/08/2015, 11/23/2016, 11/06/2017, 10/19/2020   Influenza,inj,Quad PF,6+ Mos 11/17/2014  Influenza,inj,quad, With Preservative 11/17/2014   Influenza-Unspecified 12/11/2012, 12/08/2015, 10/31/2019   PFIZER(Purple Top)SARS-COV-2 Vaccination 03/12/2019, 04/02/2019, 12/02/2019   Pneumococcal Conjugate-13 10/02/2013   Pneumococcal Polysaccharide-23 02/03/2010, 10/22/2012, 09/21/2013, 10/02/2013, 12/31/2018   Tdap 11/10/2010   Zoster Recombinat (Shingrix) 08/01/2017, 10/31/2017   Zoster, Live 05/04/2010    TDAP status: Up to date  Flu Vaccine status: Due, Education has been provided regarding the importance of this vaccine. Advised may receive this vaccine at local pharmacy or Health Dept.  Aware to provide a copy of the vaccination record if obtained from local pharmacy or Health Dept. Verbalized acceptance and understanding.  Pneumococcal vaccine status: Up to date  Covid-19 vaccine status: Completed vaccines  Qualifies for Shingles Vaccine? No   Zostavax completed Yes   Shingrix Completed?: Yes  Screening Tests Health Maintenance  Topic Date Due   COVID-19 Vaccine (4 - Booster for Pfizer series) 03/03/2020   HEMOGLOBIN A1C  10/18/2020   TETANUS/TDAP  11/09/2020   OPHTHALMOLOGY EXAM  03/02/2021   FOOT EXAM  04/20/2021   COLONOSCOPY (Pts 45-30yr Insurance coverage will need to be confirmed)  07/15/2025   INFLUENZA VACCINE  Completed   DEXA SCAN  Completed   Hepatitis C Screening  Completed   PNA vac Low Risk Adult  Completed   Zoster Vaccines- Shingrix  Completed   HPV VACCINES  Aged Out    Health Maintenance  Health Maintenance Due  Topic Date Due   COVID-19 Vaccine (4 - Booster for PDoerunseries) 03/03/2020   HEMOGLOBIN A1C  10/18/2020    Colorectal cancer screening: Type of screening: Colonoscopy. Completed 07/16/2015. Repeat every 10 years  Mammogram status: Completed Bilateral 05/18/2020. Repeat every year  Bone Density status: Completed 05/18/2020. Results reflect: Bone density results: OSTEOPENIA. Repeat every 2 years.  Lung Cancer Screening: (Low Dose CT Chest recommended if Age 362-80years, 30 pack-year currently smoking OR have quit w/in 15years.) does not qualify.    Additional Screening:  Hepatitis C Screening: Completed 12/14/2016  Vision Screening: Recommended annual ophthalmology exams for early detection of glaucoma and other disorders of the eye. Is the patient up to date with their annual eye exam?  Yes  Who is the provider or what is the name of the office in which the patient attends annual eye exams? Dr. FTrinna Post  Dental Screening: Recommended annual dental exams for proper oral hygiene  Community Resource Referral / Chronic Care  Management: CRR required this visit?  No   CCM required this visit?  No      Plan:     I have personally reviewed and noted the following in the patient's chart:   Medical and social history Use of alcohol, tobacco or illicit drugs  Current medications and supplements including opioid prescriptions.  Functional ability and status Nutritional status Physical activity Advanced directives List of other physicians Hospitalizations, surgeries, and ER visits in previous 12 months Vitals Screenings to include cognitive, depression, and falls Referrals and appointments  In addition, I have reviewed and discussed with patient certain preventive protocols, quality metrics, and best practice recommendations. A written personalized care plan for preventive services as well as general preventive health recommendations were provided to patient.   .Patient to access avs on mychart  MMarta Antu LPN   [id number] Nurse Health Advisor  Nurse Notes: None

## 2020-10-27 DIAGNOSIS — C4442 Squamous cell carcinoma of skin of scalp and neck: Secondary | ICD-10-CM | POA: Diagnosis not present

## 2020-10-27 DIAGNOSIS — C4441 Basal cell carcinoma of skin of scalp and neck: Secondary | ICD-10-CM | POA: Diagnosis not present

## 2020-10-27 DIAGNOSIS — D485 Neoplasm of uncertain behavior of skin: Secondary | ICD-10-CM | POA: Diagnosis not present

## 2020-10-28 ENCOUNTER — Other Ambulatory Visit (HOSPITAL_BASED_OUTPATIENT_CLINIC_OR_DEPARTMENT_OTHER): Payer: Self-pay

## 2020-10-28 MED ORDER — SOLIFENACIN SUCCINATE 10 MG PO TABS
10.0000 mg | ORAL_TABLET | Freq: Every day | ORAL | 1 refills | Status: DC
  Filled 2020-10-28 (×2): qty 30, 30d supply, fill #0
  Filled 2020-12-17: qty 30, 30d supply, fill #1

## 2020-10-28 MED FILL — Glucose Blood Test Strip: 50 days supply | Qty: 50 | Fill #1 | Status: AC

## 2020-10-28 MED FILL — Lancets: 90 days supply | Qty: 100 | Fill #0 | Status: AC

## 2020-10-29 ENCOUNTER — Other Ambulatory Visit (HOSPITAL_BASED_OUTPATIENT_CLINIC_OR_DEPARTMENT_OTHER): Payer: Self-pay

## 2020-10-30 ENCOUNTER — Other Ambulatory Visit (HOSPITAL_BASED_OUTPATIENT_CLINIC_OR_DEPARTMENT_OTHER): Payer: Self-pay

## 2020-10-30 MED ORDER — SOLIFENACIN SUCCINATE 10 MG PO TABS
10.0000 mg | ORAL_TABLET | Freq: Every day | ORAL | 0 refills | Status: DC
  Filled 2020-10-30: qty 90, 90d supply, fill #0

## 2020-10-30 MED ORDER — DESMOPRESSIN ACETATE 0.2 MG PO TABS
0.6000 mg | ORAL_TABLET | Freq: Every day | ORAL | 3 refills | Status: DC
  Filled 2020-10-30: qty 90, 30d supply, fill #0
  Filled 2020-11-27: qty 90, 30d supply, fill #1
  Filled 2020-12-24: qty 90, 30d supply, fill #2
  Filled 2021-01-22: qty 90, 30d supply, fill #3

## 2020-11-02 ENCOUNTER — Other Ambulatory Visit (HOSPITAL_BASED_OUTPATIENT_CLINIC_OR_DEPARTMENT_OTHER): Payer: Self-pay

## 2020-11-02 MED ORDER — CEPHALEXIN 500 MG PO CAPS
ORAL_CAPSULE | ORAL | 0 refills | Status: DC
  Filled 2020-11-02: qty 19, 5d supply, fill #0

## 2020-11-03 ENCOUNTER — Other Ambulatory Visit (HOSPITAL_BASED_OUTPATIENT_CLINIC_OR_DEPARTMENT_OTHER): Payer: Self-pay

## 2020-11-04 ENCOUNTER — Encounter: Payer: Self-pay | Admitting: Family Medicine

## 2020-11-04 DIAGNOSIS — C4490 Unspecified malignant neoplasm of skin, unspecified: Secondary | ICD-10-CM

## 2020-11-04 HISTORY — DX: Unspecified malignant neoplasm of skin, unspecified: C44.90

## 2020-11-08 ENCOUNTER — Encounter: Payer: Self-pay | Admitting: Family Medicine

## 2020-11-09 ENCOUNTER — Other Ambulatory Visit: Payer: Self-pay

## 2020-11-09 ENCOUNTER — Other Ambulatory Visit (HOSPITAL_BASED_OUTPATIENT_CLINIC_OR_DEPARTMENT_OTHER): Payer: Self-pay

## 2020-11-09 ENCOUNTER — Ambulatory Visit: Payer: Medicare PPO | Admitting: Family Medicine

## 2020-11-09 VITALS — BP 124/72 | HR 74 | Temp 98.2°F | Resp 18 | Ht 61.0 in | Wt 236.8 lb

## 2020-11-09 DIAGNOSIS — R059 Cough, unspecified: Secondary | ICD-10-CM | POA: Diagnosis not present

## 2020-11-09 MED ORDER — BENZONATATE 200 MG PO CAPS
200.0000 mg | ORAL_CAPSULE | Freq: Three times a day (TID) | ORAL | 0 refills | Status: DC | PRN
  Filled 2020-11-09: qty 30, 10d supply, fill #0

## 2020-11-09 MED ORDER — DOXYCYCLINE HYCLATE 100 MG PO CAPS
100.0000 mg | ORAL_CAPSULE | Freq: Two times a day (BID) | ORAL | 0 refills | Status: DC
  Filled 2020-11-09: qty 20, 10d supply, fill #0

## 2020-11-09 MED ORDER — ALBUTEROL SULFATE HFA 108 (90 BASE) MCG/ACT IN AERS
2.0000 | INHALATION_SPRAY | Freq: Four times a day (QID) | RESPIRATORY_TRACT | 0 refills | Status: DC | PRN
  Filled 2020-11-09: qty 18, 25d supply, fill #0

## 2020-11-09 NOTE — Progress Notes (Signed)
[facility] [address], [place], [place] [address] [phone]  Date:  11/09/2020   Name:  Jasmin [name]   DOB:  1945/03/20   MRN:  [id number]  PCP:  Darreld Mclean, MD    Chief Complaint: Cough (Productive, stared Thursday evening, taking Mucinex Neg covid test: Saturday. No other sxs besides her throat being irritated and hoarseness from the cough.)   History of Present Illness:  Jasmin [name] is a 75 y.o. very pleasant female patient who presents with the following:  Seen today for a sick visit Her son was ill recently with similar sx  Today is Monday- pt got sick on Thursday with a cough, she also notes a hoarse voice The cough can be productive No fever noted- she is checking on a regular basis She did a covid test a couple of days ago- negative She had some occasional loose stools which is not unusual for her - not a real change  Last visit with myself just last month for DM routine FU History of well-controlled diabetes, hypertension, hyperlipidemia, stroke, OSA, obesity   Patient Active Problem List   Diagnosis Date Noted   Skin cancer 11/04/2020   BMI 45.0-49.9, adult ([place]) 07/31/2020   Swelling of both lower extremities    Shortness of breath on exertion    Red eyes    Nasal discharge    Leg cramping    Knee pain    Joint pain    Easy bruising    Dry mouth    Decreased hearing    Back pain    Increased frequency of urination 12/26/2018   Nocturia 12/26/2018   Recurrent UTI 12/26/2018   Vaginal atrophy 12/26/2018   Hyperopia with astigmatism and presbyopia, bilateral 02/09/2018   Long term current use of oral hypoglycemic drug 02/09/2018   PCO (posterior capsular opacification), left 02/09/2018   Pseudophakia of left eye 02/09/2018   PVD (posterior vitreous detachment), both eyes 02/09/2018   Cough 03/17/2017   Arthritis 12/16/2016   UTI (urinary tract infection)    Stroke [facility])     Sleep apnea    Right kidney stone    Hypertension    Hyperlipidemia    Heart murmur    Diabetes mellitus without complication ([place])    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   Cystocele with rectocele 08/21/2016   Mixed hyperlipidemia 08/08/2016   Controlled type 2 diabetes mellitus without complication, without long-term current use of insulin ([place]) 08/08/2016   Essential hypertension 08/08/2016   History of CVA (cerebrovascular accident) 08/08/2016   Nuclear sclerotic cataract of left eye 07/07/2016   OAB (overactive bladder) 01/20/2016   Morbid obesity ([place]) 11/10/2015   Contusion of right knee 09/09/2015   Quadriceps weakness 09/09/2015   Right shoulder pain 08/21/2015   IBS (irritable bowel syndrome) 07/16/2015   Insomnia 07/16/2015   Left ventricular hypertrophy 07/16/2015   Pulmonary hypertension ([place]) 07/16/2015   Primary osteoarthritis of left knee 03/31/2015   Acid reflux disease 03/09/2015   Aortic stenosis 03/09/2015   AR (allergic rhinitis) 03/09/2015   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   OSA (obstructive sleep apnea) 03/09/2015   Osteoporosis 03/09/2015   Vitamin D deficiency 03/09/2015   Left tibialis posterior tendinitis 12/13/2013   Aftercare following right knee joint replacement surgery 12/04/2013   Sprain of ankle, left 11/15/2013   Overweight 08/16/2013   Broken bones 08/13/2013    Past Medical History:  Diagnosis Date  Acid reflux disease 03/09/2015   Aortic stenosis 03/09/2015   AR (allergic rhinitis) 03/09/2015   Arthritis    Back pain    Controlled type 2 diabetes mellitus without complication, without long-term current use of insulin ([place]) 08/08/2016   Cough    Cystocele with rectocele 08/21/2016   Decreased hearing    Diabetes mellitus without complication (HCC)    Dry mouth    Easy bruising    Essential hypertension 08/08/2016   Heart murmur    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   History of CVA (cerebrovascular  accident) 08/08/2016   Seen on MRI from 08/2015   Hyperlipidemia    Hypertension    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   IBS (irritable bowel syndrome) 07/16/2015   Insomnia 07/16/2015   Joint pain    Knee pain    Left ventricular hypertrophy 07/16/2015   Leg cramping    right leg   Mixed hyperlipidemia 08/08/2016   Morbid obesity ([place]) 11/10/2015   Nasal discharge    Nuclear sclerotic cataract of left eye 07/07/2016   OAB (overactive bladder) 01/20/2016   OSA (obstructive sleep apnea) 03/09/2015   Osteoporosis 03/09/2015   Primary osteoarthritis of left knee 03/31/2015   Pulmonary hypertension ([place]) 07/16/2015   Red eyes    Right kidney stone    Shortness of breath on exertion    Skin cancer 11/04/2020   Sleep apnea    Stroke ([place])    Swelling of both lower extremities    UTI (urinary tract infection)    Vitamin D deficiency 03/09/2015    Past Surgical History:  Procedure Laterality Date   ABDOMINAL HYSTERECTOMY  09/14/2016   CATARACT EXTRACTION Left    CATARACT EXTRACTION Left 06/2016   COLPORRHAPHY N/A 09/14/2016   [facility]   CYSTOCELE REPAIR     FRACTURE SURGERY Left 1953   L arm   FRACTURE SURGERY Left 1985   L ankle   HERNIA REPAIR  2004   JOINT REPLACEMENT Right 2012   R TKA   JOINT REPLACEMENT Left 2017   KIDNEY STONE SURGERY     LAPAROSCOPIC ASSISTED VAGINAL HYSTERECTOMY N/A 09/14/2016   [facility]   R foot/ankle Right 2013 and 2014   plate and screws lateral foot and tendon removal medial foot in 2013, revision in 2014   [facility]     URETEROSCOPY      Social History   Tobacco Use   Smoking status: Never   Smokeless tobacco: Never  Vaping Use   Vaping Use: Never used  Substance Use Topics   Alcohol use: Yes    Alcohol/week: 2.0 - 3.0 standard drinks    Types: 2 - 3 Glasses of wine per week    Comment: couple of glasses per week   Drug use: No    Family History  Problem Relation Age of Onset   Hyperlipidemia Mother    Heart  disease Mother    Hypertension Mother    Stroke Mother    Thyroid disease Mother    Hypertension Father    Heart disease Father     Allergies  Allergen Reactions   Latex Itching   Ciprofloxacin Hives   Metronidazole Hives   Adhesive [Tape] Rash    Medication list has been reviewed and updated.  Current Outpatient Medications on File Prior to Visit  Medication Sig Dispense Refill   Accu-Chek Softclix Lancets lancets USE AS DIRECTED TO CHECK BLOOD GLUCOSE ONCE DAILY 100  each 3   amoxicillin (AMOXIL) 500 MG capsule Take prior to dentist procedure     blood glucose meter kit and supplies KIT Dispense based on patient and insurance preference. Use once daily to monitor glucose as needed (FOR ICD-9 250.00, 250.01). 1 each 0   Blood Glucose Monitoring Suppl (ACCU-CHEK GUIDE) w/Device KIT USE AS DIRECTED ONCE DAILY TO CHECK BLOOD GLUCOSE 1 kit 3   buPROPion (WELLBUTRIN SR) 150 MG 12 hr tablet TAKE 1 TABLET (150 MG TOTAL) BY MOUTH DAILY. 30 tablet 0   CALCIUM PO Take 1 tablet by mouth daily.     cephALEXin (KEFLEX) 250 MG capsule Take 1 capsule (250 mg total) by mouth daily. 90 capsule 1   cephALEXin (KEFLEX) 500 MG capsule Take 1 capsule by mouth as directed; TAKE 4 CAPSULES BY MOUTH  30 MIN PRIOR TO SURGERY,1 THAT NIGHT, THEN 1 THREE TIMES A DAY 19 capsule 0   Cholecalciferol (VITAMIN D3) 2000 units TABS Take by mouth. Take one tablet in the AM     clopidogrel (PLAVIX) 75 MG tablet Take 1 tablet (75 mg total) by mouth daily. 90 tablet 1   cycloSPORINE (RESTASIS) 0.05 % ophthalmic emulsion Place 1 drop into both eyes 2 times daily. 180 each 1   desmopressin (DDAVP) 0.2 MG tablet Take 3 tablets (0.6 mg total) by mouth at bedtime. 90 tablet 3   desonide (DESOWEN) 0.05 % cream Apply a thin layer to the affected area twice daily for 2 weeks then stop for 1 week. Repeat for flares. 60 g 0   econazole nitrate 1 % cream Apply topically as needed.      fluticasone (FLONASE) 50 MCG/ACT nasal spray  Place 2 sprays into both nostrils daily. 48 g 3   glucose blood test strip USE AS DIRECTED TO CHECK BLOOD GLUCOSE ONCE DAILY 100 strip 3   losartan (COZAAR) 50 MG tablet Take 1 & 1/2 tablets (75 mg total) by mouth daily. 135 tablet 3   meloxicam (MOBIC) 15 MG tablet Take 15 mg by mouth daily.     metFORMIN (GLUCOPHAGE) 500 MG tablet TAKE 1 TABLET (500 MG TOTAL) BY MOUTH 2 (TWO) TIMES DAILY WITH A MEAL. 180 tablet 3   miconazole (MICOTIN) 2 % cream Apply 1 application topically 2 (two) times daily.     mirabegron ER (MYRBETRIQ) 50 MG TB24 tablet Take 1 tablet (50 mg total) by mouth daily. 90 tablet 3   omega-3 acid ethyl esters (LOVAZA) 1 g capsule TAKE 2 CAPSULES BY MOUTH TWICE DAILY 360 capsule 0   omeprazole (PRILOSEC) 40 MG capsule TAKE 1 CAPSULE (40 MG TOTAL) BY MOUTH DAILY FOR 30 DAYS. 90 capsule 0   Probiotic Product (PROBIOTIC-10 PO) Take 1 capsule by mouth daily.     PSYLLIUM PO Take by mouth.     simvastatin (ZOCOR) 20 MG tablet TAKE 1 TABLET (20 MG TOTAL) BY MOUTH AT BEDTIME. 90 tablet 3   solifenacin (VESICARE) 10 MG tablet Take 1 tablet (10 mg total) by mouth daily. 30 tablet 1   solifenacin (VESICARE) 10 MG tablet Take 1 tablet (10 mg total) by mouth daily. 90 tablet 0   Tdap (BOOSTRIX) 5-2.5-18.5 LF-MCG/0.5 injection Inject into the muscle. 0.5 mL 0   Vitamin D, Ergocalciferol, (DRISDOL) 1.25 MG (50000 UNIT) CAPS capsule TAKE 1 CAPSULE (50,000 UNITS TOTAL) BY MOUTH EVERY 7 (SEVEN) DAYS. 4 capsule 0   oxyCODONE-acetaminophen (PERCOCET) 5-325 MG tablet Take 1 tablet by mouth every 8 (eight) hours as needed for  severe pain. (Patient not taking: Reported on 11/09/2020) 15 tablet 0   No current facility-administered medications on file prior to visit.    Review of Systems:  As per HPI- otherwise negative.   Physical Examination: Vitals:   11/09/20 1055  BP: 124/72  Pulse: 74  Resp: 18  Temp: 98.2 F (36.8 C)  SpO2: 98%   Vitals:   11/09/20 1055  Weight: 236 lb 12.8 oz  (107.4 kg)  Height: 5' 1"  (1.549 m)   Body mass index is 44.74 kg/m. Ideal Body Weight: Weight in (lb) to have BMI = 25: 132  GEN: no acute distress. HEENT: Atraumatic, Normocephalic.  Ears and Nose: No external deformity. CV: RRR, No M/G/R. No JVD. No thrill. No extra heart sounds. PULM:  mild wheeze, no crackles, rhonchi. No retractions. No resp. distress. No accessory muscle use. ABD: S, NT, ND, +BS. No rebound. No HSM. EXTR: No c/c/e PSYCH: Normally interactive. Conversant.  Obese, otherwise looks well   Assessment and Plan: Cough - Plan: doxycycline (VIBRAMYCIN) 100 MG capsule, albuterol (VENTOLIN HFA) 108 (90 Base) MCG/ACT inhaler, benzonatate (TESSALON) 200 MG capsule Pt seen today with cough Likely bronchitis Start on doxycycline, tessalon, albuterol prn Recommend taking a repeat covid test in the next day or so She will let me know if not doing better in the next few days- Sooner if worse.      Signed Lamar Blinks, MD

## 2020-11-09 NOTE — Patient Instructions (Signed)
Good to see you today- please let me know if you are not feeling better over the next few days, sooner if worse

## 2020-11-10 ENCOUNTER — Ambulatory Visit (INDEPENDENT_AMBULATORY_CARE_PROVIDER_SITE_OTHER): Payer: Medicare PPO | Admitting: Family Medicine

## 2020-11-10 ENCOUNTER — Other Ambulatory Visit (HOSPITAL_BASED_OUTPATIENT_CLINIC_OR_DEPARTMENT_OTHER): Payer: Self-pay

## 2020-11-10 ENCOUNTER — Other Ambulatory Visit: Payer: Self-pay

## 2020-11-10 MED ORDER — CARESTART COVID-19 HOME TEST VI KIT
PACK | 0 refills | Status: DC
  Filled 2020-11-10: qty 2, 2d supply, fill #0

## 2020-11-11 ENCOUNTER — Ambulatory Visit: Payer: Medicare PPO | Admitting: Family Medicine

## 2020-11-12 ENCOUNTER — Ambulatory Visit: Payer: Medicare PPO | Admitting: Family Medicine

## 2020-11-16 ENCOUNTER — Other Ambulatory Visit (HOSPITAL_BASED_OUTPATIENT_CLINIC_OR_DEPARTMENT_OTHER): Payer: Self-pay

## 2020-11-16 ENCOUNTER — Other Ambulatory Visit: Payer: Self-pay | Admitting: Family Medicine

## 2020-11-16 DIAGNOSIS — R053 Chronic cough: Secondary | ICD-10-CM | POA: Diagnosis not present

## 2020-11-16 DIAGNOSIS — E782 Mixed hyperlipidemia: Secondary | ICD-10-CM

## 2020-11-16 DIAGNOSIS — K219 Gastro-esophageal reflux disease without esophagitis: Secondary | ICD-10-CM | POA: Diagnosis not present

## 2020-11-16 MED ORDER — MELOXICAM 15 MG PO TABS
ORAL_TABLET | ORAL | 1 refills | Status: DC
  Filled 2020-11-16: qty 30, 30d supply, fill #0

## 2020-11-16 MED ORDER — MELOXICAM 15 MG PO TABS
ORAL_TABLET | ORAL | 11 refills | Status: DC
  Filled 2020-11-16: qty 30, 30d supply, fill #0

## 2020-11-16 MED ORDER — OMEGA-3-ACID ETHYL ESTERS 1 G PO CAPS
2.0000 | ORAL_CAPSULE | Freq: Two times a day (BID) | ORAL | 2 refills | Status: DC
  Filled 2020-11-16: qty 360, 90d supply, fill #0
  Filled 2021-03-08: qty 360, 90d supply, fill #1
  Filled 2021-06-02: qty 360, 90d supply, fill #2

## 2020-11-16 MED ORDER — OMEPRAZOLE 40 MG PO CPDR
40.0000 mg | DELAYED_RELEASE_CAPSULE | Freq: Every day | ORAL | 11 refills | Status: DC
  Filled 2020-11-16: qty 30, 30d supply, fill #0
  Filled 2020-12-30: qty 30, 30d supply, fill #1
  Filled 2021-02-11: qty 30, 30d supply, fill #2
  Filled 2021-03-22: qty 30, 30d supply, fill #3
  Filled 2021-04-14: qty 30, 30d supply, fill #4
  Filled 2021-05-17: qty 30, 30d supply, fill #5
  Filled 2021-06-17: qty 30, 30d supply, fill #6
  Filled 2021-07-23: qty 30, 30d supply, fill #7
  Filled 2021-08-16: qty 30, 30d supply, fill #8
  Filled 2021-09-13: qty 30, 30d supply, fill #9
  Filled 2021-10-11: qty 30, 30d supply, fill #10
  Filled 2021-11-04: qty 30, 30d supply, fill #11

## 2020-11-16 MED FILL — Metformin HCl Tab 500 MG: ORAL | 90 days supply | Qty: 180 | Fill #1 | Status: AC

## 2020-11-17 DIAGNOSIS — G4733 Obstructive sleep apnea (adult) (pediatric): Secondary | ICD-10-CM | POA: Diagnosis not present

## 2020-11-24 DIAGNOSIS — C4442 Squamous cell carcinoma of skin of scalp and neck: Secondary | ICD-10-CM | POA: Diagnosis not present

## 2020-11-26 ENCOUNTER — Ambulatory Visit: Payer: Medicare PPO | Attending: Internal Medicine

## 2020-11-26 ENCOUNTER — Other Ambulatory Visit (HOSPITAL_BASED_OUTPATIENT_CLINIC_OR_DEPARTMENT_OTHER): Payer: Self-pay

## 2020-11-26 DIAGNOSIS — M25551 Pain in right hip: Secondary | ICD-10-CM | POA: Diagnosis not present

## 2020-11-26 DIAGNOSIS — Z96651 Presence of right artificial knee joint: Secondary | ICD-10-CM | POA: Diagnosis not present

## 2020-11-26 DIAGNOSIS — M160 Bilateral primary osteoarthritis of hip: Secondary | ICD-10-CM | POA: Diagnosis not present

## 2020-11-26 DIAGNOSIS — I998 Other disorder of circulatory system: Secondary | ICD-10-CM | POA: Diagnosis not present

## 2020-11-26 DIAGNOSIS — Z471 Aftercare following joint replacement surgery: Secondary | ICD-10-CM | POA: Diagnosis not present

## 2020-11-26 DIAGNOSIS — M25559 Pain in unspecified hip: Secondary | ICD-10-CM | POA: Diagnosis not present

## 2020-11-26 DIAGNOSIS — M461 Sacroiliitis, not elsewhere classified: Secondary | ICD-10-CM | POA: Diagnosis not present

## 2020-11-26 DIAGNOSIS — Z23 Encounter for immunization: Secondary | ICD-10-CM

## 2020-11-26 DIAGNOSIS — M25461 Effusion, right knee: Secondary | ICD-10-CM | POA: Diagnosis not present

## 2020-11-26 DIAGNOSIS — M47817 Spondylosis without myelopathy or radiculopathy, lumbosacral region: Secondary | ICD-10-CM | POA: Diagnosis not present

## 2020-11-26 DIAGNOSIS — M85861 Other specified disorders of bone density and structure, right lower leg: Secondary | ICD-10-CM | POA: Diagnosis not present

## 2020-11-26 DIAGNOSIS — Z96653 Presence of artificial knee joint, bilateral: Secondary | ICD-10-CM | POA: Diagnosis not present

## 2020-11-26 MED ORDER — MELOXICAM 15 MG PO TABS: 15.0000 mg | ORAL_TABLET | Freq: Every day | ORAL | 2 refills | Status: DC

## 2020-11-26 NOTE — Progress Notes (Signed)
   Covid-19 Vaccination Clinic  Name:  Jasmin [name]    MRN: [id number] DOB: 30-Jul-1945  11/26/2020  [name] was observed post Covid-19 immunization for 15 minutes without incident. She was provided with Vaccine Information Sheet and instruction to access the V-Safe system.   [name] was instructed to call 911 with any severe reactions post vaccine: Difficulty breathing  Swelling of face and throat  A fast heartbeat  A bad rash all over body  Dizziness and weakness

## 2020-11-27 ENCOUNTER — Other Ambulatory Visit (HOSPITAL_BASED_OUTPATIENT_CLINIC_OR_DEPARTMENT_OTHER): Payer: Self-pay

## 2020-11-30 ENCOUNTER — Other Ambulatory Visit (HOSPITAL_BASED_OUTPATIENT_CLINIC_OR_DEPARTMENT_OTHER): Payer: Self-pay

## 2020-12-02 ENCOUNTER — Other Ambulatory Visit (HOSPITAL_BASED_OUTPATIENT_CLINIC_OR_DEPARTMENT_OTHER): Payer: Self-pay

## 2020-12-04 ENCOUNTER — Other Ambulatory Visit (HOSPITAL_BASED_OUTPATIENT_CLINIC_OR_DEPARTMENT_OTHER): Payer: Self-pay

## 2020-12-04 MED ORDER — NAPROXEN 500 MG PO TBEC
DELAYED_RELEASE_TABLET | ORAL | 1 refills | Status: DC
  Filled 2020-12-04: qty 60, 30d supply, fill #0
  Filled 2021-01-04: qty 60, 30d supply, fill #1

## 2020-12-04 MED ORDER — COVID-19MRNA BIVAL VACC PFIZER 30 MCG/0.3ML IM SUSP
INTRAMUSCULAR | 0 refills | Status: DC
  Filled 2020-12-04: qty 0.3, 1d supply, fill #0

## 2020-12-07 ENCOUNTER — Other Ambulatory Visit (HOSPITAL_BASED_OUTPATIENT_CLINIC_OR_DEPARTMENT_OTHER): Payer: Self-pay

## 2020-12-08 ENCOUNTER — Other Ambulatory Visit (HOSPITAL_BASED_OUTPATIENT_CLINIC_OR_DEPARTMENT_OTHER): Payer: Self-pay

## 2020-12-09 DIAGNOSIS — M1611 Unilateral primary osteoarthritis, right hip: Secondary | ICD-10-CM | POA: Diagnosis not present

## 2020-12-12 DIAGNOSIS — M24851 Other specific joint derangements of right hip, not elsewhere classified: Secondary | ICD-10-CM | POA: Diagnosis not present

## 2020-12-16 DIAGNOSIS — Z6841 Body Mass Index (BMI) 40.0 and over, adult: Secondary | ICD-10-CM | POA: Diagnosis not present

## 2020-12-16 DIAGNOSIS — Z8249 Family history of ischemic heart disease and other diseases of the circulatory system: Secondary | ICD-10-CM | POA: Diagnosis not present

## 2020-12-16 DIAGNOSIS — Z79899 Other long term (current) drug therapy: Secondary | ICD-10-CM | POA: Diagnosis not present

## 2020-12-16 DIAGNOSIS — G8929 Other chronic pain: Secondary | ICD-10-CM | POA: Diagnosis not present

## 2020-12-16 DIAGNOSIS — I10 Essential (primary) hypertension: Secondary | ICD-10-CM | POA: Diagnosis not present

## 2020-12-16 DIAGNOSIS — R0602 Shortness of breath: Secondary | ICD-10-CM | POA: Diagnosis not present

## 2020-12-16 DIAGNOSIS — Z7984 Long term (current) use of oral hypoglycemic drugs: Secondary | ICD-10-CM | POA: Diagnosis not present

## 2020-12-16 DIAGNOSIS — Z791 Long term (current) use of non-steroidal anti-inflammatories (NSAID): Secondary | ICD-10-CM | POA: Diagnosis not present

## 2020-12-16 DIAGNOSIS — F325 Major depressive disorder, single episode, in full remission: Secondary | ICD-10-CM | POA: Diagnosis not present

## 2020-12-16 DIAGNOSIS — Z833 Family history of diabetes mellitus: Secondary | ICD-10-CM | POA: Diagnosis not present

## 2020-12-16 DIAGNOSIS — G4733 Obstructive sleep apnea (adult) (pediatric): Secondary | ICD-10-CM | POA: Diagnosis not present

## 2020-12-16 DIAGNOSIS — Z7902 Long term (current) use of antithrombotics/antiplatelets: Secondary | ICD-10-CM | POA: Diagnosis not present

## 2020-12-16 DIAGNOSIS — E119 Type 2 diabetes mellitus without complications: Secondary | ICD-10-CM | POA: Diagnosis not present

## 2020-12-16 DIAGNOSIS — Z7409 Other reduced mobility: Secondary | ICD-10-CM | POA: Diagnosis not present

## 2020-12-16 DIAGNOSIS — H04129 Dry eye syndrome of unspecified lacrimal gland: Secondary | ICD-10-CM | POA: Diagnosis not present

## 2020-12-16 DIAGNOSIS — N3941 Urge incontinence: Secondary | ICD-10-CM | POA: Diagnosis not present

## 2020-12-16 DIAGNOSIS — E785 Hyperlipidemia, unspecified: Secondary | ICD-10-CM | POA: Diagnosis not present

## 2020-12-17 ENCOUNTER — Other Ambulatory Visit (HOSPITAL_BASED_OUTPATIENT_CLINIC_OR_DEPARTMENT_OTHER): Payer: Self-pay

## 2020-12-17 DIAGNOSIS — G4733 Obstructive sleep apnea (adult) (pediatric): Secondary | ICD-10-CM | POA: Diagnosis not present

## 2020-12-22 ENCOUNTER — Telehealth (INDEPENDENT_AMBULATORY_CARE_PROVIDER_SITE_OTHER): Payer: Self-pay

## 2020-12-22 NOTE — Telephone Encounter (Signed)
Dr.Beasley 

## 2020-12-22 NOTE — Telephone Encounter (Signed)
Please see message and advise.  Thank you.    I called pt and she wanted to inform Dr. Leafy Ro the she is having a hip replacement.  She will be going to see the Daisy Floro, MD,  to discuss surgery.  Pt's original surgeon will be in out of town and recommended Dr. Ria Bush.  Pt was told that her BMI needed to be 40 and under to perform surgery, so pt is asking for Dr. Willeen Cass to maybe send a note to office explaining that she is a pt here and has been working toward weight loss.  I informed pt that I would send message to Dr. Leafy Ro and let her know.

## 2020-12-22 NOTE — Telephone Encounter (Signed)
Pt called in and stated that she would like a call back from dr. Migdalia Dk nurse. Please advise

## 2020-12-23 ENCOUNTER — Encounter (INDEPENDENT_AMBULATORY_CARE_PROVIDER_SITE_OTHER): Payer: Self-pay

## 2020-12-23 NOTE — Telephone Encounter (Signed)
Sure, please have Jasmin [name] help you write up a note saying per pt request we are sending this note. she is our pt and she is working on weight loss with her starting BMI and current BMI.

## 2020-12-24 ENCOUNTER — Encounter (INDEPENDENT_AMBULATORY_CARE_PROVIDER_SITE_OTHER): Payer: Self-pay

## 2020-12-24 ENCOUNTER — Other Ambulatory Visit (HOSPITAL_BASED_OUTPATIENT_CLINIC_OR_DEPARTMENT_OTHER): Payer: Self-pay

## 2020-12-24 MED ORDER — CEPHALEXIN 250 MG PO CAPS
ORAL_CAPSULE | ORAL | 1 refills | Status: DC
  Filled 2020-12-24: qty 90, 90d supply, fill #0
  Filled 2021-03-29: qty 90, 90d supply, fill #1

## 2020-12-24 NOTE — Telephone Encounter (Signed)
I spoke with pt to inform her that her letter was ready.  Patient asked if I could send it in the mail.  I informed patient that the letter will be sent out today.

## 2020-12-25 ENCOUNTER — Other Ambulatory Visit (HOSPITAL_BASED_OUTPATIENT_CLINIC_OR_DEPARTMENT_OTHER): Payer: Self-pay

## 2020-12-28 DIAGNOSIS — M879 Osteonecrosis, unspecified: Secondary | ICD-10-CM | POA: Diagnosis not present

## 2020-12-28 DIAGNOSIS — M1611 Unilateral primary osteoarthritis, right hip: Secondary | ICD-10-CM | POA: Diagnosis not present

## 2020-12-28 DIAGNOSIS — R7309 Other abnormal glucose: Secondary | ICD-10-CM | POA: Diagnosis not present

## 2020-12-28 DIAGNOSIS — M25551 Pain in right hip: Secondary | ICD-10-CM | POA: Diagnosis not present

## 2020-12-28 DIAGNOSIS — M25559 Pain in unspecified hip: Secondary | ICD-10-CM | POA: Diagnosis not present

## 2020-12-29 ENCOUNTER — Other Ambulatory Visit (HOSPITAL_BASED_OUTPATIENT_CLINIC_OR_DEPARTMENT_OTHER): Payer: Self-pay

## 2020-12-29 ENCOUNTER — Other Ambulatory Visit: Payer: Self-pay | Admitting: Family Medicine

## 2020-12-29 MED ORDER — ACCU-CHEK SOFTCLIX LANCETS MISC
3 refills | Status: DC
  Filled 2020-12-29: qty 100, fill #0
  Filled 2021-03-08: qty 100, 90d supply, fill #0
  Filled 2021-04-26 – 2021-06-21 (×2): qty 100, 90d supply, fill #1
  Filled 2021-10-01: qty 100, 90d supply, fill #2
  Filled 2021-12-20: qty 100, 90d supply, fill #3

## 2020-12-30 ENCOUNTER — Other Ambulatory Visit (HOSPITAL_BASED_OUTPATIENT_CLINIC_OR_DEPARTMENT_OTHER): Payer: Self-pay

## 2020-12-30 MED ORDER — ACCU-CHEK GUIDE VI STRP
ORAL_STRIP | 3 refills | Status: DC
  Filled 2020-12-30: qty 50, 50d supply, fill #0
  Filled 2021-03-08: qty 50, 50d supply, fill #1
  Filled 2021-04-26: qty 50, 50d supply, fill #2
  Filled 2021-06-21: qty 50, 50d supply, fill #3
  Filled 2021-08-06: qty 50, 50d supply, fill #4
  Filled 2021-10-01: qty 50, 50d supply, fill #5
  Filled 2021-11-17: qty 50, 50d supply, fill #6
  Filled 2021-12-20 – 2021-12-29 (×3): qty 50, 50d supply, fill #7
  Filled ????-??-??: fill #2

## 2021-01-04 ENCOUNTER — Other Ambulatory Visit (HOSPITAL_BASED_OUTPATIENT_CLINIC_OR_DEPARTMENT_OTHER): Payer: Self-pay

## 2021-01-05 ENCOUNTER — Other Ambulatory Visit (HOSPITAL_BASED_OUTPATIENT_CLINIC_OR_DEPARTMENT_OTHER): Payer: Self-pay

## 2021-01-15 ENCOUNTER — Other Ambulatory Visit (HOSPITAL_BASED_OUTPATIENT_CLINIC_OR_DEPARTMENT_OTHER): Payer: Self-pay

## 2021-01-15 MED FILL — Simvastatin Tab 20 MG: ORAL | 90 days supply | Qty: 90 | Fill #2 | Status: AC

## 2021-01-18 ENCOUNTER — Other Ambulatory Visit (HOSPITAL_BASED_OUTPATIENT_CLINIC_OR_DEPARTMENT_OTHER): Payer: Self-pay

## 2021-01-19 ENCOUNTER — Ambulatory Visit (INDEPENDENT_AMBULATORY_CARE_PROVIDER_SITE_OTHER): Payer: Medicare PPO | Admitting: Family Medicine

## 2021-01-19 DIAGNOSIS — I350 Nonrheumatic aortic (valve) stenosis: Secondary | ICD-10-CM | POA: Diagnosis not present

## 2021-01-19 DIAGNOSIS — G4733 Obstructive sleep apnea (adult) (pediatric): Secondary | ICD-10-CM | POA: Diagnosis not present

## 2021-01-19 DIAGNOSIS — E119 Type 2 diabetes mellitus without complications: Secondary | ICD-10-CM | POA: Diagnosis not present

## 2021-01-19 DIAGNOSIS — Z8673 Personal history of transient ischemic attack (TIA), and cerebral infarction without residual deficits: Secondary | ICD-10-CM | POA: Diagnosis not present

## 2021-01-20 DIAGNOSIS — R2689 Other abnormalities of gait and mobility: Secondary | ICD-10-CM | POA: Diagnosis not present

## 2021-01-20 DIAGNOSIS — M1611 Unilateral primary osteoarthritis, right hip: Secondary | ICD-10-CM | POA: Diagnosis not present

## 2021-01-20 DIAGNOSIS — R531 Weakness: Secondary | ICD-10-CM | POA: Diagnosis not present

## 2021-01-22 ENCOUNTER — Other Ambulatory Visit (HOSPITAL_BASED_OUTPATIENT_CLINIC_OR_DEPARTMENT_OTHER): Payer: Self-pay

## 2021-01-24 ENCOUNTER — Encounter: Payer: Self-pay | Admitting: Family Medicine

## 2021-01-26 ENCOUNTER — Other Ambulatory Visit: Payer: Self-pay

## 2021-01-26 ENCOUNTER — Encounter (INDEPENDENT_AMBULATORY_CARE_PROVIDER_SITE_OTHER): Payer: Self-pay | Admitting: Family Medicine

## 2021-01-26 ENCOUNTER — Other Ambulatory Visit (HOSPITAL_BASED_OUTPATIENT_CLINIC_OR_DEPARTMENT_OTHER): Payer: Self-pay

## 2021-01-26 ENCOUNTER — Ambulatory Visit (INDEPENDENT_AMBULATORY_CARE_PROVIDER_SITE_OTHER): Payer: Medicare PPO | Admitting: Family Medicine

## 2021-01-26 VITALS — BP 138/72 | HR 77 | Temp 98.0°F | Ht 61.0 in | Wt 231.0 lb

## 2021-01-26 DIAGNOSIS — E559 Vitamin D deficiency, unspecified: Secondary | ICD-10-CM

## 2021-01-26 DIAGNOSIS — E119 Type 2 diabetes mellitus without complications: Secondary | ICD-10-CM

## 2021-01-26 DIAGNOSIS — Z6841 Body Mass Index (BMI) 40.0 and over, adult: Secondary | ICD-10-CM

## 2021-01-26 MED ORDER — VITAMIN D (ERGOCALCIFEROL) 1.25 MG (50000 UNIT) PO CAPS
ORAL_CAPSULE | ORAL | 1 refills | Status: DC
  Filled 2021-01-26: qty 4, 28d supply, fill #0
  Filled 2021-02-24: qty 4, 28d supply, fill #1

## 2021-01-26 NOTE — Progress Notes (Signed)
Chief Complaint:   OBESITY Jasmin [name] is here to discuss her progress with her obesity treatment plan along with follow-up of her obesity related diagnoses. Jasmin [name] is on the Category 2 Plan and keeping a food journal and adhering to recommended goals of 400-500 calories and 35+ grams of protein at supper daily and states she is following her eating plan approximately 25-30% of the time. Jasmin [name] states she is doing 0 minutes 0 times per week.  Today's visit was #: 66 Starting weight: 248 lbs Starting date: 06/21/2017 Today's weight: 231 lbs Today's date: 01/26/2021 Total lbs lost to date: 17 Total lbs lost since last in-office visit: 2  Interim History: Jasmin [name] continues to do well with weight loss. She will have a hip repair/replacement in 6 days, and she is very excited to have less pain. She is doing her physical therapy prescribed exercises.  Subjective:   1. Vitamin D deficiency Jasmin [name] is stable on Vit D, and she denies nausea, vomiting, or muscle weakness.  2. Type 2 diabetes mellitus without complication, without long-term current use of insulin (HCC) Jasmin [name]'s fasting blood sugars mostly range between 100-115. She is working on decreasing simple carbohydrates, and increasing vegetables and protein. She denies hypoglycemia.  Assessment/Plan:   1. Vitamin D deficiency Low Vitamin D level contributes to fatigue and are associated with obesity, breast, and colon cancer. We will refill prescription Vitamin D for 2 months. Jasmin [name] will follow-up for routine testing of Vitamin D, at least 2-3 times per year to avoid over-replacement.  - Vitamin D, Ergocalciferol, (DRISDOL) 1.25 MG (50000 UNIT) CAPS capsule; TAKE 1 CAPSULE (50,000 UNITS TOTAL) BY MOUTH EVERY 7 (SEVEN) DAYS.  Dispense: 4 capsule; Refill: 1  2. Type 2 diabetes mellitus without complication, without long-term current use of insulin (HCC) Jasmin [name] will continue diet and exercise, and will continue to monitor. Good blood  sugar control is important to decrease the likelihood of diabetic complications such as nephropathy, neuropathy, limb loss, blindness, coronary artery disease, and death. Intensive lifestyle modification including diet, exercise and weight loss are the first line of treatment for diabetes.   3. Obesity BMI today is 88 Jasmin [name] is currently in the action stage of change. As such, her goal is to continue with weight loss efforts. She has agreed to the Category 2 Plan.   Exercise goal: PT prescribed exercises.  Behavioral modification strategies: increasing lean protein intake and holiday eating strategies .  Jasmin [name] has agreed to follow-up with our clinic in 6 weeks. She was informed of the importance of frequent follow-up visits to maximize her success with intensive lifestyle modifications for her multiple health conditions.   Objective:   Blood pressure 138/72, pulse 77, temperature 98 F (36.7 C), height 5\' 1"  (1.549 m), weight 231 lb (104.8 kg), SpO2 100 %. Body mass index is 43.65 kg/m.  General: Cooperative, alert, well developed, in no acute distress. HEENT: Conjunctivae and lids unremarkable. Cardiovascular: Regular rhythm.  Lungs: Normal work of breathing. Neurologic: No focal deficits.   Lab Results  Component Value Date   CREATININE 0.60 10/19/2020   BUN 14 10/19/2020   NA 138 10/19/2020   K 4.9 10/19/2020   CL 102 10/19/2020   CO2 27 10/19/2020   Lab Results  Component Value Date   ALT 15 04/20/2020   AST 21 04/20/2020   ALKPHOS 49 04/20/2020   BILITOT 0.8 04/20/2020   Lab Results  Component Value Date   HGBA1C 6.3 10/19/2020   HGBA1C 6.4 04/20/2020  HGBA1C 6.1 (H) 10/16/2019   HGBA1C 6.1 (H) 07/01/2019   HGBA1C 6.6 (H) 12/31/2018   Lab Results  Component Value Date   INSULIN 21.1 07/01/2019   INSULIN 24.2 09/28/2017   INSULIN 17.6 06/21/2017   Lab Results  Component Value Date   TSH 1.860 07/01/2019   Lab Results  Component Value Date   CHOL 204  (H) 04/20/2020   HDL 51.00 04/20/2020   LDLCALC 117 (H) 10/16/2019   LDLDIRECT 122.0 04/20/2020   TRIG 207.0 (H) 04/20/2020   CHOLHDL 4 04/20/2020   Lab Results  Component Value Date   VD25OH 38.16 04/20/2020   VD25OH 39.9 07/01/2019   VD25OH 31.2 03/06/2019   Lab Results  Component Value Date   WBC 6.3 04/20/2020   HGB 13.0 04/20/2020   HCT 39.2 04/20/2020   MCV 89.4 04/20/2020   PLT 269.0 04/20/2020   No results found for: IRON, TIBC, FERRITIN  Obesity Behavioral Intervention:   Approximately 15 minutes were spent on the discussion below.  ASK: We discussed the diagnosis of obesity with Jasmin [name] today and Jasmin [name] agreed to give [name] permission to discuss obesity behavioral modification therapy today.  ASSESS: Jasmin [name] has the diagnosis of obesity and her BMI today is 43.6. Jasmin [name] is in the action stage of change.   ADVISE: Jasmin [name] was educated on the multiple health risks of obesity as well as the benefit of weight loss to improve her health. She was advised of the need for long term treatment and the importance of lifestyle modifications to improve her current health and to decrease her risk of future health problems.  AGREE: Multiple dietary modification options and treatment options were discussed and Jasmin [name] agreed to follow the recommendations documented in the above note.  ARRANGE: Jasmin [name] was educated on the importance of frequent visits to treat obesity as outlined per CMS and USPSTF guidelines and agreed to schedule her next follow up appointment today.  Attestation Statements:   Reviewed by clinician on day of visit: allergies, medications, problem list, medical history, surgical history, family history, social history, and previous encounter notes.   I, Trixie Dredge, am acting as transcriptionist for Dennard Nip, MD.  I have reviewed the above documentation for accuracy and completeness, and I agree with the above. -  Dennard Nip, MD

## 2021-01-27 ENCOUNTER — Other Ambulatory Visit (HOSPITAL_BASED_OUTPATIENT_CLINIC_OR_DEPARTMENT_OTHER): Payer: Self-pay

## 2021-02-01 ENCOUNTER — Other Ambulatory Visit (HOSPITAL_BASED_OUTPATIENT_CLINIC_OR_DEPARTMENT_OTHER): Payer: Self-pay

## 2021-02-01 DIAGNOSIS — E785 Hyperlipidemia, unspecified: Secondary | ICD-10-CM | POA: Diagnosis not present

## 2021-02-01 DIAGNOSIS — Z471 Aftercare following joint replacement surgery: Secondary | ICD-10-CM | POA: Diagnosis not present

## 2021-02-01 DIAGNOSIS — S72001A Fracture of unspecified part of neck of right femur, initial encounter for closed fracture: Secondary | ICD-10-CM | POA: Diagnosis not present

## 2021-02-01 DIAGNOSIS — J309 Allergic rhinitis, unspecified: Secondary | ICD-10-CM | POA: Diagnosis not present

## 2021-02-01 DIAGNOSIS — I350 Nonrheumatic aortic (valve) stenosis: Secondary | ICD-10-CM | POA: Diagnosis not present

## 2021-02-01 DIAGNOSIS — M84351A Stress fracture, right femur, initial encounter for fracture: Secondary | ICD-10-CM | POA: Diagnosis not present

## 2021-02-01 DIAGNOSIS — N3281 Overactive bladder: Secondary | ICD-10-CM | POA: Diagnosis not present

## 2021-02-01 DIAGNOSIS — M1611 Unilateral primary osteoarthritis, right hip: Secondary | ICD-10-CM | POA: Diagnosis not present

## 2021-02-01 DIAGNOSIS — Z96641 Presence of right artificial hip joint: Secondary | ICD-10-CM | POA: Diagnosis not present

## 2021-02-01 DIAGNOSIS — E119 Type 2 diabetes mellitus without complications: Secondary | ICD-10-CM | POA: Diagnosis not present

## 2021-02-01 DIAGNOSIS — K589 Irritable bowel syndrome without diarrhea: Secondary | ICD-10-CM | POA: Diagnosis not present

## 2021-02-01 DIAGNOSIS — I10 Essential (primary) hypertension: Secondary | ICD-10-CM | POA: Diagnosis not present

## 2021-02-01 DIAGNOSIS — R2689 Other abnormalities of gait and mobility: Secondary | ICD-10-CM | POA: Diagnosis not present

## 2021-02-01 DIAGNOSIS — G4733 Obstructive sleep apnea (adult) (pediatric): Secondary | ICD-10-CM | POA: Diagnosis not present

## 2021-02-01 DIAGNOSIS — Z96653 Presence of artificial knee joint, bilateral: Secondary | ICD-10-CM | POA: Diagnosis not present

## 2021-02-01 DIAGNOSIS — G8918 Other acute postprocedural pain: Secondary | ICD-10-CM | POA: Diagnosis not present

## 2021-02-01 DIAGNOSIS — M87051 Idiopathic aseptic necrosis of right femur: Secondary | ICD-10-CM | POA: Diagnosis not present

## 2021-02-02 ENCOUNTER — Other Ambulatory Visit (HOSPITAL_BASED_OUTPATIENT_CLINIC_OR_DEPARTMENT_OTHER): Payer: Self-pay

## 2021-02-02 DIAGNOSIS — J309 Allergic rhinitis, unspecified: Secondary | ICD-10-CM | POA: Diagnosis not present

## 2021-02-02 DIAGNOSIS — M84351A Stress fracture, right femur, initial encounter for fracture: Secondary | ICD-10-CM | POA: Diagnosis not present

## 2021-02-02 DIAGNOSIS — N3281 Overactive bladder: Secondary | ICD-10-CM | POA: Diagnosis not present

## 2021-02-02 DIAGNOSIS — K589 Irritable bowel syndrome without diarrhea: Secondary | ICD-10-CM | POA: Diagnosis not present

## 2021-02-02 DIAGNOSIS — E119 Type 2 diabetes mellitus without complications: Secondary | ICD-10-CM | POA: Diagnosis not present

## 2021-02-02 DIAGNOSIS — E785 Hyperlipidemia, unspecified: Secondary | ICD-10-CM | POA: Diagnosis not present

## 2021-02-02 DIAGNOSIS — I350 Nonrheumatic aortic (valve) stenosis: Secondary | ICD-10-CM | POA: Diagnosis not present

## 2021-02-02 DIAGNOSIS — G8918 Other acute postprocedural pain: Secondary | ICD-10-CM | POA: Diagnosis not present

## 2021-02-02 DIAGNOSIS — I10 Essential (primary) hypertension: Secondary | ICD-10-CM | POA: Diagnosis not present

## 2021-02-02 DIAGNOSIS — G4733 Obstructive sleep apnea (adult) (pediatric): Secondary | ICD-10-CM | POA: Diagnosis not present

## 2021-02-02 MED ORDER — VITAMIN C 500 MG PO TABS
ORAL_TABLET | ORAL | 0 refills | Status: DC
  Filled 2021-02-02: qty 100, 100d supply, fill #0

## 2021-02-02 MED ORDER — POLYSACCHARIDE IRON COMPLEX 150 MG PO CAPS
150.0000 mg | ORAL_CAPSULE | Freq: Every day | ORAL | 0 refills | Status: DC
  Filled 2021-02-02: qty 30, 30d supply, fill #0

## 2021-02-02 MED ORDER — "CURITY ABDOMINAL 5""X9"" PADS": MEDICATED_PAD | 0 refills | Status: DC

## 2021-02-02 MED ORDER — POVIDONE-IODINE 10 % EX SOLN
CUTANEOUS | 0 refills | Status: DC
Start: 2021-02-02 — End: 2021-02-23

## 2021-02-02 MED ORDER — ASPIRIN EC 81 MG PO TBEC
DELAYED_RELEASE_TABLET | ORAL | 0 refills | Status: DC
Start: 2021-02-02 — End: 2021-04-21
  Filled 2021-02-02: qty 120, 120d supply, fill #0

## 2021-02-02 MED ORDER — "RA ADHESIVE 1""X10YD TAPE": MEDICATED_TAPE | 0 refills | Status: DC

## 2021-02-02 MED ORDER — SENNOSIDES-DOCUSATE SODIUM 8.6-50 MG PO TABS
ORAL_TABLET | ORAL | 0 refills | Status: DC
  Filled 2021-02-02: qty 100, 25d supply, fill #0

## 2021-02-02 MED ORDER — OXYCODONE-ACETAMINOPHEN 5-325 MG PO TABS
ORAL_TABLET | ORAL | 0 refills | Status: DC
  Filled 2021-02-02: qty 42, 7d supply, fill #0

## 2021-02-08 ENCOUNTER — Other Ambulatory Visit (HOSPITAL_BASED_OUTPATIENT_CLINIC_OR_DEPARTMENT_OTHER): Payer: Self-pay

## 2021-02-08 MED ORDER — OXYCODONE-ACETAMINOPHEN 5-325 MG PO TABS
ORAL_TABLET | ORAL | 0 refills | Status: DC
  Filled 2021-02-08: qty 28, 7d supply, fill #0

## 2021-02-09 ENCOUNTER — Other Ambulatory Visit (HOSPITAL_BASED_OUTPATIENT_CLINIC_OR_DEPARTMENT_OTHER): Payer: Self-pay

## 2021-02-09 DIAGNOSIS — Z79899 Other long term (current) drug therapy: Secondary | ICD-10-CM | POA: Diagnosis not present

## 2021-02-09 DIAGNOSIS — M1611 Unilateral primary osteoarthritis, right hip: Secondary | ICD-10-CM | POA: Diagnosis not present

## 2021-02-11 ENCOUNTER — Other Ambulatory Visit: Payer: Self-pay | Admitting: Family Medicine

## 2021-02-11 ENCOUNTER — Other Ambulatory Visit (HOSPITAL_BASED_OUTPATIENT_CLINIC_OR_DEPARTMENT_OTHER): Payer: Self-pay

## 2021-02-11 DIAGNOSIS — Z79899 Other long term (current) drug therapy: Secondary | ICD-10-CM | POA: Diagnosis not present

## 2021-02-11 DIAGNOSIS — E119 Type 2 diabetes mellitus without complications: Secondary | ICD-10-CM

## 2021-02-11 DIAGNOSIS — M1611 Unilateral primary osteoarthritis, right hip: Secondary | ICD-10-CM | POA: Diagnosis not present

## 2021-02-11 MED ORDER — SOLIFENACIN SUCCINATE 10 MG PO TABS
10.0000 mg | ORAL_TABLET | Freq: Every day | ORAL | 1 refills | Status: DC
  Filled 2021-02-11: qty 30, 30d supply, fill #0
  Filled 2021-04-08: qty 30, 30d supply, fill #1

## 2021-02-12 ENCOUNTER — Other Ambulatory Visit (HOSPITAL_BASED_OUTPATIENT_CLINIC_OR_DEPARTMENT_OTHER): Payer: Self-pay

## 2021-02-12 MED ORDER — METFORMIN HCL 500 MG PO TABS
ORAL_TABLET | Freq: Two times a day (BID) | ORAL | 1 refills | Status: DC
  Filled 2021-02-12: qty 180, 90d supply, fill #0
  Filled 2021-05-17: qty 180, 90d supply, fill #1

## 2021-02-16 ENCOUNTER — Other Ambulatory Visit (HOSPITAL_BASED_OUTPATIENT_CLINIC_OR_DEPARTMENT_OTHER): Payer: Self-pay

## 2021-02-17 DIAGNOSIS — M1611 Unilateral primary osteoarthritis, right hip: Secondary | ICD-10-CM | POA: Diagnosis not present

## 2021-02-17 DIAGNOSIS — Z79899 Other long term (current) drug therapy: Secondary | ICD-10-CM | POA: Diagnosis not present

## 2021-02-18 DIAGNOSIS — Z96641 Presence of right artificial hip joint: Secondary | ICD-10-CM | POA: Diagnosis not present

## 2021-02-18 DIAGNOSIS — Z471 Aftercare following joint replacement surgery: Secondary | ICD-10-CM | POA: Diagnosis not present

## 2021-02-19 DIAGNOSIS — M1611 Unilateral primary osteoarthritis, right hip: Secondary | ICD-10-CM | POA: Diagnosis not present

## 2021-02-19 DIAGNOSIS — R399 Unspecified symptoms and signs involving the genitourinary system: Secondary | ICD-10-CM | POA: Diagnosis not present

## 2021-02-19 DIAGNOSIS — R2689 Other abnormalities of gait and mobility: Secondary | ICD-10-CM | POA: Diagnosis not present

## 2021-02-19 DIAGNOSIS — M25651 Stiffness of right hip, not elsewhere classified: Secondary | ICD-10-CM | POA: Diagnosis not present

## 2021-02-19 DIAGNOSIS — M6281 Muscle weakness (generalized): Secondary | ICD-10-CM | POA: Diagnosis not present

## 2021-02-23 ENCOUNTER — Other Ambulatory Visit (HOSPITAL_BASED_OUTPATIENT_CLINIC_OR_DEPARTMENT_OTHER): Payer: Self-pay

## 2021-02-23 ENCOUNTER — Ambulatory Visit: Payer: Medicare PPO | Admitting: Family Medicine

## 2021-02-23 ENCOUNTER — Other Ambulatory Visit: Payer: Self-pay

## 2021-02-23 ENCOUNTER — Encounter: Payer: Self-pay | Admitting: Family Medicine

## 2021-02-23 VITALS — BP 106/72 | HR 82 | Temp 97.6°F | Ht 61.0 in | Wt 227.0 lb

## 2021-02-23 DIAGNOSIS — R350 Frequency of micturition: Secondary | ICD-10-CM | POA: Diagnosis not present

## 2021-02-23 LAB — POC URINALSYSI DIPSTICK (AUTOMATED)
Bilirubin, UA: NEGATIVE
Blood, UA: NEGATIVE
Glucose, UA: NEGATIVE
Ketones, UA: NEGATIVE
Nitrite, UA: NEGATIVE
Protein, UA: NEGATIVE
Spec Grav, UA: 1.015
Urobilinogen, UA: 0.2 U/dL
pH, UA: 6.5

## 2021-02-23 LAB — POCT UA - MICROSCOPIC ONLY

## 2021-02-23 MED ORDER — SULFAMETHOXAZOLE-TRIMETHOPRIM 800-160 MG PO TABS
1.0000 | ORAL_TABLET | Freq: Two times a day (BID) | ORAL | 0 refills | Status: DC
  Filled 2021-02-23: qty 6, 3d supply, fill #0

## 2021-02-23 NOTE — Patient Instructions (Addendum)
Keep up with water intake.  Stop vitamin C. Avoid bladder irritants as discussed.  Complete course of antibiotics. We will call with urine culture results.

## 2021-02-23 NOTE — Assessment & Plan Note (Signed)
New, worsening.  neg Urine Culture last week per pt but symptoms worsening.  UA and micro in office today concerning for UTI.  Will end for culture but treat empirically with antibiotics.  Increase water, stop vit C as may be irritating bladder.

## 2021-02-23 NOTE — Progress Notes (Signed)
Patient ID: Jasmin [name], female    DOB: Jul 18, 1945, 76 y.o.   MRN: [id number]  This visit was conducted in person.  BP 106/72    Pulse 82    Temp 97.6 F (36.4 C) (Temporal)    Ht 5' 1"  (1.549 m)    Wt 227 lb (103 kg)    SpO2 98%    BMI 42.89 kg/m    CC:  Chief Complaint  Patient presents with   Urinary Frequency   Burning with Urination    Subjective:   HPI: Jasmin [name] is a 76 y.o. female presenting on 02/23/2021 for Urinary Frequency and Burning with Urination  Urinary Frequency  This is a new problem. The current episode started in the past 7 days. The problem occurs intermittently. The problem has been gradually worsening. The quality of the pain is described as burning. The pain is moderate. There has been no fever. She is Not sexually active. There is No history of pyelonephritis. Associated symptoms include frequency and urgency. Pertinent negatives include no flank pain, hematuria, nausea, possible pregnancy or vomiting. She has tried nothing for the symptoms. The treatment provided mild relief. Her past medical history is significant for catheterization and recurrent UTIs. There is no history of kidney stones, a single kidney or a urological procedure. last UTi 6 month    Dr. Zigmund Daniel urologist... contacted them last week.. had urine culture show 10, 000 col.ml.  On vesicare, desmopressin, myrbetriq for OAB.   She has history of recurrent UTI.    She has started azo.. this has helped some.    2 week as ago had right hip replacement.. catheterization.    Drinking a lot of water.  Relevant past medical, surgical, family and social history reviewed and updated as indicated. Interim medical history since our last visit reviewed. Allergies and medications reviewed and updated. Outpatient Medications Prior to Visit  Medication Sig Dispense Refill   Accu-Chek Softclix Lancets lancets USE AS DIRECTED TO CHECK BLOOD GLUCOSE ONCE DAILY 100 each 3   Adhesive Tape (RA  ADHESIVE 1"X10YD) TAPE 2 inch Paper tape roll 2 each 0   amoxicillin (AMOXIL) 500 MG capsule Take prior to dentist procedure     aspirin EC 81 MG tablet Take 1 tablet (81 mg total) by mouth daily. Until 03/17/2021 120 tablet 0   blood glucose meter kit and supplies KIT Dispense based on patient and insurance preference. Use once daily to monitor glucose as needed (FOR ICD-9 250.00, 250.01). 1 each 0   CALCIUM PO Take 1 tablet by mouth daily.     cephALEXin (KEFLEX) 250 MG capsule Take 1 capsule (250 mg total) by mouth daily. 90 capsule 1   Cholecalciferol (VITAMIN D3) 2000 units TABS Take by mouth. Take one tablet in the AM     clopidogrel (PLAVIX) 75 MG tablet Take 1 tablet (75 mg total) by mouth daily. 90 tablet 1   desmopressin (DDAVP) 0.2 MG tablet Take 3 tablets (0.6 mg total) by mouth at bedtime. 90 tablet 3   desonide (DESOWEN) 0.05 % cream Apply a thin layer to the affected area twice daily for 2 weeks then stop for 1 week. Repeat for flares. 60 g 0   econazole nitrate 1 % cream Apply topically as needed.      fluticasone (FLONASE) 50 MCG/ACT nasal spray Place 2 sprays into both nostrils daily. 48 g 3   Gauze Pads & Dressings (CURITY ABDOMINAL) 5"X9" PADS ABD pad- Apply twice a day to  cover incision and secure with paper tape.  DO this once your Aquacel is removed. 20 each 0   glucose blood (ACCU-CHEK GUIDE) test strip USE AS DIRECTED TO CHECK BLOOD GLUCOSE ONCE DAILY 100 strip 3   iron polysaccharides (NIFEREX) 150 MG capsule Take 1 capsule (150 mg total) by mouth daily. 30 capsule 0   losartan (COZAAR) 50 MG tablet Take 1 & 1/2 tablets (75 mg total) by mouth daily. 135 tablet 3   metFORMIN (GLUCOPHAGE) 500 MG tablet TAKE 1 TABLET (500 MG TOTAL) BY MOUTH 2 (TWO) TIMES DAILY WITH A MEAL. 180 tablet 1   mirabegron ER (MYRBETRIQ) 50 MG TB24 tablet Take 1 tablet (50 mg total) by mouth daily. 90 tablet 3   omega-3 acid ethyl esters (LOVAZA) 1 g capsule TAKE 2 CAPSULES BY MOUTH TWICE DAILY 360  capsule 2   omeprazole (PRILOSEC) 40 MG capsule Take 1 capsule (40 mg total) by mouth daily. 30 capsule 11   Probiotic Product (PROBIOTIC-10 PO) Take 1 capsule by mouth daily.     PSYLLIUM PO Take by mouth.     simvastatin (ZOCOR) 20 MG tablet TAKE 1 TABLET (20 MG TOTAL) BY MOUTH AT BEDTIME. 90 tablet 3   solifenacin (VESICARE) 10 MG tablet Take 1 tablet (10 mg total) by mouth daily. 30 tablet 1   vitamin C (ASCORBIC ACID) 500 MG tablet Take 1/2 tablet by mouth twice a day 100 tablet 0   Vitamin D, Ergocalciferol, (DRISDOL) 1.25 MG (50000 UNIT) CAPS capsule TAKE 1 CAPSULE (50,000 UNITS TOTAL) BY MOUTH EVERY 7 (SEVEN) DAYS. 4 capsule 1   buPROPion (WELLBUTRIN SR) 150 MG 12 hr tablet TAKE 1 TABLET (150 MG TOTAL) BY MOUTH DAILY. (Patient not taking: Reported on 01/26/2021) 30 tablet 0   COVID-19 mRNA bivalent vaccine, Pfizer, injection Inject into the muscle. 0.3 mL 0   doxycycline (VIBRAMYCIN) 100 MG capsule Take 1 capsule (100 mg total) by mouth 2 (two) times daily. 20 capsule 0   meloxicam (MOBIC) 15 MG tablet TAKE 1 TABLET (15 MG) BY MOUTH IN THE MORNING. 30 tablet 11   naproxen (EC NAPROSYN) 500 MG EC tablet Take 1 tablet (500 mg total) by mouth 2 times daily with meals. 60 tablet 1   oxyCODONE-acetaminophen (PERCOCET/ROXICET) 5-325 MG tablet Take 1 tablet by mouth every 6 hours as needed for pain. **Wean off** 28 tablet 0   povidone-iodine (BETADINE) 10 % external solution Betadine Solution - Apply solution to sterile cotton ball and paint down incision.  DO this twice  Day and cover with dry dressing. 480 mL 0   senna-docusate (SENOKOT-S) 8.6-50 MG tablet Take 2 tablets by mouth 2 times daily for 7 days. 100 tablet 0   Tdap (BOOSTRIX) 5-2.5-18.5 LF-MCG/0.5 injection Inject into the muscle. 0.5 mL 0   No facility-administered medications prior to visit.     Per HPI unless specifically indicated in ROS section below Review of Systems  Constitutional:  Negative for fatigue and fever.  HENT:   Negative for ear pain.   Eyes:  Negative for pain.  Respiratory:  Negative for chest tightness and shortness of breath.   Cardiovascular:  Negative for chest pain, palpitations and leg swelling.  Gastrointestinal:  Negative for abdominal pain, nausea and vomiting.  Genitourinary:  Positive for frequency and urgency. Negative for dysuria, flank pain and hematuria.  Musculoskeletal:  Positive for gait problem.       Right hip pain, but improving s/p surgery  Objective:  BP 106/72  Pulse 82    Temp 97.6 F (36.4 C) (Temporal)    Ht 5' 1"  (1.549 m)    Wt 227 lb (103 kg)    SpO2 98%    BMI 42.89 kg/m   Wt Readings from Last 3 Encounters:  02/23/21 227 lb (103 kg)  01/26/21 231 lb (104.8 kg)  11/09/20 236 lb 12.8 oz (107.4 kg)      Physical Exam Constitutional:      General: She is not in acute distress.    Appearance: Normal appearance. She is well-developed. She is not ill-appearing or toxic-appearing.  HENT:     Head: Normocephalic.     Right Ear: Hearing, tympanic membrane, ear canal and external ear normal. Tympanic membrane is not erythematous, retracted or bulging.     Left Ear: Hearing, tympanic membrane, ear canal and external ear normal. Tympanic membrane is not erythematous, retracted or bulging.     Nose: No mucosal edema or rhinorrhea.     Right Sinus: No maxillary sinus tenderness or frontal sinus tenderness.     Left Sinus: No maxillary sinus tenderness or frontal sinus tenderness.     Mouth/Throat:     Pharynx: Uvula midline.  Eyes:     General: Lids are normal. Lids are everted, no foreign bodies appreciated.     Conjunctiva/sclera: Conjunctivae normal.     Pupils: Pupils are equal, round, and reactive to light.  Neck:     Thyroid: No thyroid mass or thyromegaly.     Vascular: No carotid bruit.     Trachea: Trachea normal.  Cardiovascular:     Rate and Rhythm: Normal rate and regular rhythm.     Pulses: Normal pulses.     Heart sounds: Normal heart sounds, S1  normal and S2 normal. No murmur heard.   No friction rub. No gallop.  Pulmonary:     Effort: Pulmonary effort is normal. No tachypnea or respiratory distress.     Breath sounds: Normal breath sounds. No decreased breath sounds, wheezing, rhonchi or rales.  Abdominal:     General: Bowel sounds are normal.     Palpations: Abdomen is soft.     Tenderness: There is no abdominal tenderness.  Musculoskeletal:     Cervical back: Normal range of motion and neck supple.  Skin:    General: Skin is warm and dry.     Findings: No rash.  Neurological:     Mental Status: She is alert.  Psychiatric:        Mood and Affect: Mood is not anxious or depressed.        Speech: Speech normal.        Behavior: Behavior normal. Behavior is cooperative.        Thought Content: Thought content normal.        Judgment: Judgment normal.      Results for orders placed or performed in visit on 10/19/20  Hemoglobin A1c  Result Value Ref Range   Hgb A1c MFr Bld 6.3 4.6 - 6.5 %  Basic metabolic panel  Result Value Ref Range   Sodium 138 135 - 145 mEq/L   Potassium 4.9 3.5 - 5.1 mEq/L   Chloride 102 96 - 112 mEq/L   CO2 27 19 - 32 mEq/L   Glucose, Bld 100 (H) 70 - 99 mg/dL   BUN 14 6 - 23 mg/dL   Creatinine, Ser 0.60 0.40 - 1.20 mg/dL   GFR 87.68 >60.00 mL/min   Calcium 9.5 8.4 - 10.5 mg/dL  This visit occurred during the SARS-CoV-2 public health emergency.  Safety protocols were in place, including screening questions prior to the visit, additional usage of staff PPE, and extensive cleaning of exam room while observing appropriate contact time as indicated for disinfecting solutions.   COVID 19 screen:  No recent travel or known exposure to COVID19 The patient denies respiratory symptoms of COVID 19 at this time. The importance of social distancing was discussed today.   Assessment and Plan  Problem List Items Addressed This Visit     Urinary frequency - Primary    New, worsening.  neg Urine  Culture last week per pt but symptoms worsening.  UA and micro in office today concerning for UTI.  Will end for culture but treat empirically with antibiotics.  Increase water, stop vit C as may be irritating bladder.      Relevant Orders   POCT Urinalysis Dipstick (Automated) (Completed)   POCT UA - Microscopic Only (Completed)   Urine Culture   Meds ordered this encounter  Medications   sulfamethoxazole-trimethoprim (BACTRIM DS) 800-160 MG tablet    Sig: Take 1 tablet by mouth 2 (two) times daily.    Dispense:  6 tablet    Refill:  0     Eliezer Lofts, MD

## 2021-02-24 ENCOUNTER — Other Ambulatory Visit (HOSPITAL_BASED_OUTPATIENT_CLINIC_OR_DEPARTMENT_OTHER): Payer: Self-pay

## 2021-02-24 ENCOUNTER — Other Ambulatory Visit: Payer: Self-pay | Admitting: Family Medicine

## 2021-02-24 DIAGNOSIS — M25551 Pain in right hip: Secondary | ICD-10-CM | POA: Diagnosis not present

## 2021-02-24 DIAGNOSIS — Z8673 Personal history of transient ischemic attack (TIA), and cerebral infarction without residual deficits: Secondary | ICD-10-CM

## 2021-02-24 DIAGNOSIS — Z4789 Encounter for other orthopedic aftercare: Secondary | ICD-10-CM | POA: Diagnosis not present

## 2021-02-24 MED ORDER — CLOPIDOGREL BISULFATE 75 MG PO TABS
75.0000 mg | ORAL_TABLET | Freq: Every day | ORAL | 1 refills | Status: DC
  Filled 2021-02-24: qty 90, 90d supply, fill #0
  Filled 2021-06-02: qty 90, 90d supply, fill #1

## 2021-02-24 MED ORDER — DESMOPRESSIN ACETATE 0.2 MG PO TABS
ORAL_TABLET | ORAL | 3 refills | Status: DC
  Filled 2021-02-24: qty 90, 30d supply, fill #0
  Filled 2021-03-29: qty 90, 30d supply, fill #1
  Filled 2021-04-22: qty 90, 30d supply, fill #2
  Filled 2021-05-26: qty 90, 30d supply, fill #3

## 2021-02-25 DIAGNOSIS — M1611 Unilateral primary osteoarthritis, right hip: Secondary | ICD-10-CM | POA: Diagnosis not present

## 2021-02-25 DIAGNOSIS — M25551 Pain in right hip: Secondary | ICD-10-CM | POA: Diagnosis not present

## 2021-02-25 DIAGNOSIS — M6281 Muscle weakness (generalized): Secondary | ICD-10-CM | POA: Diagnosis not present

## 2021-02-25 DIAGNOSIS — R2689 Other abnormalities of gait and mobility: Secondary | ICD-10-CM | POA: Diagnosis not present

## 2021-02-26 ENCOUNTER — Encounter: Payer: Self-pay | Admitting: Family Medicine

## 2021-02-26 ENCOUNTER — Other Ambulatory Visit (HOSPITAL_BASED_OUTPATIENT_CLINIC_OR_DEPARTMENT_OTHER): Payer: Self-pay

## 2021-02-26 LAB — URINE CULTURE
MICRO NUMBER:: 12820752
SPECIMEN QUALITY:: ADEQUATE

## 2021-02-26 MED ORDER — SULFAMETHOXAZOLE-TRIMETHOPRIM 800-160 MG PO TABS
1.0000 | ORAL_TABLET | Freq: Two times a day (BID) | ORAL | 0 refills | Status: DC
  Filled 2021-02-26: qty 8, 4d supply, fill #0

## 2021-03-01 ENCOUNTER — Encounter: Payer: Self-pay | Admitting: Family Medicine

## 2021-03-01 DIAGNOSIS — R350 Frequency of micturition: Secondary | ICD-10-CM

## 2021-03-01 NOTE — Telephone Encounter (Signed)
Pt is scheduled 1/13, just need active request for culture.

## 2021-03-03 DIAGNOSIS — M1611 Unilateral primary osteoarthritis, right hip: Secondary | ICD-10-CM | POA: Diagnosis not present

## 2021-03-03 DIAGNOSIS — M6281 Muscle weakness (generalized): Secondary | ICD-10-CM | POA: Diagnosis not present

## 2021-03-03 DIAGNOSIS — M25551 Pain in right hip: Secondary | ICD-10-CM | POA: Diagnosis not present

## 2021-03-03 DIAGNOSIS — R2689 Other abnormalities of gait and mobility: Secondary | ICD-10-CM | POA: Diagnosis not present

## 2021-03-04 DIAGNOSIS — L821 Other seborrheic keratosis: Secondary | ICD-10-CM | POA: Diagnosis not present

## 2021-03-04 DIAGNOSIS — Z85828 Personal history of other malignant neoplasm of skin: Secondary | ICD-10-CM | POA: Diagnosis not present

## 2021-03-04 DIAGNOSIS — L820 Inflamed seborrheic keratosis: Secondary | ICD-10-CM | POA: Diagnosis not present

## 2021-03-05 ENCOUNTER — Other Ambulatory Visit: Payer: Medicare PPO

## 2021-03-05 DIAGNOSIS — M79604 Pain in right leg: Secondary | ICD-10-CM | POA: Diagnosis not present

## 2021-03-05 DIAGNOSIS — R29898 Other symptoms and signs involving the musculoskeletal system: Secondary | ICD-10-CM | POA: Diagnosis not present

## 2021-03-05 DIAGNOSIS — R350 Frequency of micturition: Secondary | ICD-10-CM

## 2021-03-05 DIAGNOSIS — Z4789 Encounter for other orthopedic aftercare: Secondary | ICD-10-CM | POA: Diagnosis not present

## 2021-03-06 LAB — URINE CULTURE
MICRO NUMBER:: 12868477
Result:: NO GROWTH
SPECIMEN QUALITY:: ADEQUATE

## 2021-03-07 ENCOUNTER — Encounter: Payer: Self-pay | Admitting: Family Medicine

## 2021-03-08 ENCOUNTER — Other Ambulatory Visit (HOSPITAL_BASED_OUTPATIENT_CLINIC_OR_DEPARTMENT_OTHER): Payer: Self-pay

## 2021-03-09 ENCOUNTER — Ambulatory Visit (INDEPENDENT_AMBULATORY_CARE_PROVIDER_SITE_OTHER): Payer: Medicare PPO | Admitting: Family Medicine

## 2021-03-09 ENCOUNTER — Other Ambulatory Visit (HOSPITAL_BASED_OUTPATIENT_CLINIC_OR_DEPARTMENT_OTHER): Payer: Self-pay

## 2021-03-09 ENCOUNTER — Other Ambulatory Visit: Payer: Self-pay

## 2021-03-09 ENCOUNTER — Encounter (INDEPENDENT_AMBULATORY_CARE_PROVIDER_SITE_OTHER): Payer: Self-pay | Admitting: Family Medicine

## 2021-03-09 VITALS — BP 147/78 | HR 73 | Temp 97.4°F | Ht 61.0 in | Wt 231.0 lb

## 2021-03-09 DIAGNOSIS — Z6841 Body Mass Index (BMI) 40.0 and over, adult: Secondary | ICD-10-CM

## 2021-03-09 DIAGNOSIS — E119 Type 2 diabetes mellitus without complications: Secondary | ICD-10-CM | POA: Diagnosis not present

## 2021-03-09 DIAGNOSIS — E559 Vitamin D deficiency, unspecified: Secondary | ICD-10-CM | POA: Diagnosis not present

## 2021-03-09 MED ORDER — VITAMIN D (ERGOCALCIFEROL) 1.25 MG (50000 UNIT) PO CAPS
ORAL_CAPSULE | ORAL | 1 refills | Status: DC
  Filled 2021-03-09 – 2021-03-22 (×2): qty 4, 28d supply, fill #0
  Filled 2021-04-14: qty 4, 28d supply, fill #1

## 2021-03-09 NOTE — Progress Notes (Signed)
Chief Complaint:   OBESITY Jasmin [name] is here to discuss her progress with her obesity treatment plan along with follow-up of her obesity related diagnoses. Jasmin [name] is on the Category 2 Plan and states she is following her eating plan approximately 70% of the time. Jasmin [name] states she is walking and taking the stairs 5 times per week.  Today's visit was #: 28 Starting weight: 248 lbs Starting date: 06/21/2017 Today's weight: 231 lbs Today's date: 03/09/2021 Total lbs lost to date: 17 Total lbs lost since last in-office visit: 0  Interim History: Jasmin [name] has done very well avoiding weight gain over the holidays, and after her hip replacement surgery. She is doing well and she has already finished physical therapy, and she is working on staying active.  Subjective:   1. Type 2 diabetes mellitus without complication, without long-term current use of insulin (HCC) Jasmin [name]'s fasting blood sugars range between 109-135 in the last month, even over the holidays. She has no signs of hypoglycemia.  2. Vitamin D deficiency Jasmin [name] is on Vit D with no side effects noted. Her fatigue is improving.  Assessment/Plan:   1. Type 2 diabetes mellitus without complication, without long-term current use of insulin ([place]) Jasmin [name] will continue with diet and exercise, and we will continue to follow. Good blood sugar control is important to decrease the likelihood of diabetic complications such as nephropathy, neuropathy, limb loss, blindness, coronary artery disease, and death. Intensive lifestyle modification including diet, exercise and weight loss are the first line of treatment for diabetes.   2. Vitamin D deficiency Low Vitamin D level contributes to fatigue and are associated with obesity, breast, and colon cancer. We will refill prescription Vitamin D for 2 months. Jasmin [name] will follow-up for routine testing of Vitamin D, at least 2-3 times per year to avoid over-replacement.  - Vitamin D,  Ergocalciferol, (DRISDOL) 1.25 MG (50000 UNIT) CAPS capsule; TAKE 1 CAPSULE (50,000 UNITS TOTAL) BY MOUTH EVERY 7 (SEVEN) DAYS.  Dispense: 4 capsule; Refill: 1  3. Obesity BMI today is 43.6 Jasmin [name] is currently in the action stage of change. As such, her goal is to continue with weight loss efforts. She has agreed to the Category 2 Plan.   Exercise goals: As is.  Behavioral modification strategies: increasing lean protein intake and no skipping meals.  Jasmin [name] has agreed to follow-up with our clinic in 3 to 4 weeks. She was informed of the importance of frequent follow-up visits to maximize her success with intensive lifestyle modifications for her multiple health conditions.   Objective:   Blood pressure (!) 147/78, pulse 73, temperature (!) 97.4 F (36.3 C), temperature source Oral, height 5\' 1"  (1.549 m), weight 231 lb (104.8 kg), SpO2 99 %. Body mass index is 43.65 kg/m.  General: Cooperative, alert, well developed, in no acute distress. HEENT: Conjunctivae and lids unremarkable. Cardiovascular: Regular rhythm.  Lungs: Normal work of breathing. Neurologic: No focal deficits.   Lab Results  Component Value Date   CREATININE 0.60 10/19/2020   BUN 14 10/19/2020   NA 138 10/19/2020   K 4.9 10/19/2020   CL 102 10/19/2020   CO2 27 10/19/2020   Lab Results  Component Value Date   ALT 15 04/20/2020   AST 21 04/20/2020   ALKPHOS 49 04/20/2020   BILITOT 0.8 04/20/2020   Lab Results  Component Value Date   HGBA1C 6.3 10/19/2020   HGBA1C 6.4 04/20/2020   HGBA1C 6.1 (H) 10/16/2019   HGBA1C 6.1 (H) 07/01/2019   HGBA1C  6.6 (H) 12/31/2018   Lab Results  Component Value Date   INSULIN 21.1 07/01/2019   INSULIN 24.2 09/28/2017   INSULIN 17.6 06/21/2017   Lab Results  Component Value Date   TSH 1.860 07/01/2019   Lab Results  Component Value Date   CHOL 204 (H) 04/20/2020   HDL 51.00 04/20/2020   LDLCALC 117 (H) 10/16/2019   LDLDIRECT 122.0 04/20/2020   TRIG 207.0 (H)  04/20/2020   CHOLHDL 4 04/20/2020   Lab Results  Component Value Date   VD25OH 38.16 04/20/2020   VD25OH 39.9 07/01/2019   VD25OH 31.2 03/06/2019   Lab Results  Component Value Date   WBC 6.3 04/20/2020   HGB 13.0 04/20/2020   HCT 39.2 04/20/2020   MCV 89.4 04/20/2020   PLT 269.0 04/20/2020   No results found for: IRON, TIBC, FERRITIN  Obesity Behavioral Intervention:   Approximately 15 minutes were spent on the discussion below.  ASK: We discussed the diagnosis of obesity with Jasmin [name] today and Jasmin [name] agreed to give [name] permission to discuss obesity behavioral modification therapy today.  ASSESS: Kamari has the diagnosis of obesity and her BMI today is 43.6. Michalene is in the action stage of change.   ADVISE: Jasmin [name] was educated on the multiple health risks of obesity as well as the benefit of weight loss to improve her health. She was advised of the need for long term treatment and the importance of lifestyle modifications to improve her current health and to decrease her risk of future health problems.  AGREE: Multiple dietary modification options and treatment options were discussed and Jasmin [name] agreed to follow the recommendations documented in the above note.  ARRANGE: Jasmin [name] was educated on the importance of frequent visits to treat obesity as outlined per CMS and USPSTF guidelines and agreed to schedule her next follow up appointment today.  Attestation Statements:   Reviewed by clinician on day of visit: allergies, medications, problem list, medical history, surgical history, family history, social history, and previous encounter notes.   I, Trixie Dredge, am acting as transcriptionist for Dennard Nip, MD.  I have reviewed the above documentation for accuracy and completeness, and I agree with the above. -  Dennard Nip, MD

## 2021-03-10 ENCOUNTER — Other Ambulatory Visit (HOSPITAL_BASED_OUTPATIENT_CLINIC_OR_DEPARTMENT_OTHER): Payer: Self-pay

## 2021-03-18 DIAGNOSIS — Z471 Aftercare following joint replacement surgery: Secondary | ICD-10-CM | POA: Diagnosis not present

## 2021-03-18 DIAGNOSIS — Z96641 Presence of right artificial hip joint: Secondary | ICD-10-CM | POA: Diagnosis not present

## 2021-03-18 DIAGNOSIS — M1612 Unilateral primary osteoarthritis, left hip: Secondary | ICD-10-CM | POA: Diagnosis not present

## 2021-03-18 DIAGNOSIS — M461 Sacroiliitis, not elsewhere classified: Secondary | ICD-10-CM | POA: Diagnosis not present

## 2021-03-22 ENCOUNTER — Other Ambulatory Visit (HOSPITAL_BASED_OUTPATIENT_CLINIC_OR_DEPARTMENT_OTHER): Payer: Self-pay

## 2021-03-29 ENCOUNTER — Other Ambulatory Visit (HOSPITAL_BASED_OUTPATIENT_CLINIC_OR_DEPARTMENT_OTHER): Payer: Self-pay

## 2021-04-05 DIAGNOSIS — H524 Presbyopia: Secondary | ICD-10-CM | POA: Diagnosis not present

## 2021-04-05 DIAGNOSIS — H52203 Unspecified astigmatism, bilateral: Secondary | ICD-10-CM | POA: Diagnosis not present

## 2021-04-05 DIAGNOSIS — H16223 Keratoconjunctivitis sicca, not specified as Sjogren's, bilateral: Secondary | ICD-10-CM | POA: Diagnosis not present

## 2021-04-05 DIAGNOSIS — Z961 Presence of intraocular lens: Secondary | ICD-10-CM | POA: Diagnosis not present

## 2021-04-05 DIAGNOSIS — E119 Type 2 diabetes mellitus without complications: Secondary | ICD-10-CM | POA: Diagnosis not present

## 2021-04-05 DIAGNOSIS — H5203 Hypermetropia, bilateral: Secondary | ICD-10-CM | POA: Diagnosis not present

## 2021-04-05 DIAGNOSIS — H43813 Vitreous degeneration, bilateral: Secondary | ICD-10-CM | POA: Diagnosis not present

## 2021-04-05 DIAGNOSIS — Z7984 Long term (current) use of oral hypoglycemic drugs: Secondary | ICD-10-CM | POA: Diagnosis not present

## 2021-04-05 DIAGNOSIS — H2511 Age-related nuclear cataract, right eye: Secondary | ICD-10-CM | POA: Diagnosis not present

## 2021-04-08 ENCOUNTER — Other Ambulatory Visit (HOSPITAL_BASED_OUTPATIENT_CLINIC_OR_DEPARTMENT_OTHER): Payer: Self-pay

## 2021-04-14 ENCOUNTER — Other Ambulatory Visit: Payer: Self-pay | Admitting: Family Medicine

## 2021-04-14 ENCOUNTER — Other Ambulatory Visit (HOSPITAL_BASED_OUTPATIENT_CLINIC_OR_DEPARTMENT_OTHER): Payer: Self-pay

## 2021-04-14 DIAGNOSIS — E782 Mixed hyperlipidemia: Secondary | ICD-10-CM

## 2021-04-14 MED ORDER — SIMVASTATIN 20 MG PO TABS
ORAL_TABLET | Freq: Every day | ORAL | 3 refills | Status: DC
  Filled 2021-04-14: qty 90, 90d supply, fill #0
  Filled 2021-07-14: qty 90, 90d supply, fill #1
  Filled 2021-10-11: qty 90, 90d supply, fill #2
  Filled 2022-01-06: qty 90, 90d supply, fill #3

## 2021-04-15 DIAGNOSIS — R053 Chronic cough: Secondary | ICD-10-CM | POA: Diagnosis not present

## 2021-04-15 DIAGNOSIS — I517 Cardiomegaly: Secondary | ICD-10-CM | POA: Diagnosis not present

## 2021-04-15 DIAGNOSIS — I272 Pulmonary hypertension, unspecified: Secondary | ICD-10-CM | POA: Diagnosis not present

## 2021-04-15 DIAGNOSIS — G4733 Obstructive sleep apnea (adult) (pediatric): Secondary | ICD-10-CM | POA: Diagnosis not present

## 2021-04-16 ENCOUNTER — Other Ambulatory Visit (HOSPITAL_BASED_OUTPATIENT_CLINIC_OR_DEPARTMENT_OTHER): Payer: Self-pay

## 2021-04-16 DIAGNOSIS — E119 Type 2 diabetes mellitus without complications: Secondary | ICD-10-CM | POA: Diagnosis not present

## 2021-04-16 DIAGNOSIS — Z7409 Other reduced mobility: Secondary | ICD-10-CM | POA: Diagnosis not present

## 2021-04-16 DIAGNOSIS — B351 Tinea unguium: Secondary | ICD-10-CM | POA: Diagnosis not present

## 2021-04-16 MED ORDER — CLOTRIMAZOLE-BETAMETHASONE 1-0.05 % EX CREA
TOPICAL_CREAM | CUTANEOUS | 11 refills | Status: DC
  Filled 2021-04-16: qty 45, 30d supply, fill #0
  Filled 2021-09-13: qty 45, 30d supply, fill #1
  Filled 2021-11-17: qty 45, 30d supply, fill #2
  Filled 2021-12-20: qty 45, 30d supply, fill #3
  Filled 2022-03-04: qty 45, 30d supply, fill #4
  Filled 2022-04-03: qty 45, 30d supply, fill #5

## 2021-04-18 NOTE — Progress Notes (Addendum)
[facility] [address], [place], [place] [address] [phone]  Date:  04/21/2021   Name:  Jasmin [name]   DOB:  [birth year]   MRN:  [id number]  PCP:  Darreld Mclean, MD    Chief Complaint: Annual Exam (Concerns/ questions: /Foot exam due: Sees Podiatry/Eye exam: dr Trinna Post)   History of Present Illness:  Jasmin [name] is a 76 y.o. very pleasant female patient who presents with the following:  Patient seen today for physical exam Most recent visit with myself was in September when she was sick with a cough History of well-controlled diabetes, hypertension, hyperlipidemia, stroke, OSA, obesity  [facility] Dr. Leafy Ro She got a RIGHT hip replacement in December- she has recovered well Her pulmonologist is Dr. Abbe Amsterdam in February.  No changes made   Her home am glucose 100- 105 However her glucose has been up to about 120 in the am for a week or so-we discussed this, will check A1c today Lab Results  Component Value Date   HGBA1C 6.3 10/19/2020   She is going to have her right cataract removed coming up- she does not think she will have to stop her plavix but will let me know if advice is needed  Plavix Losartan 50 Metformin 500 twice daily Immunizations are up-to-date  She is trying to increase her exercise now that she is recovered from hip surgery  Lab Results  Component Value Date   HGBA1C 6.3 10/19/2020    Patient Active Problem List   Diagnosis Date Noted   Skin cancer 11/04/2020   BMI 45.0-49.9, adult ([place]) 07/31/2020   Swelling of both lower extremities    Shortness of breath on exertion    Red eyes    Nasal discharge    Leg cramping    Knee pain    Joint pain    Easy bruising    Dry mouth    Decreased hearing    Back pain    Urinary frequency 12/26/2018   Nocturia 12/26/2018   Recurrent UTI 12/26/2018   Vaginal atrophy 12/26/2018    Hyperopia with astigmatism and presbyopia, bilateral 02/09/2018   Long term current use of oral hypoglycemic drug 02/09/2018   PCO (posterior capsular opacification), left 02/09/2018   Pseudophakia of left eye 02/09/2018   PVD (posterior vitreous detachment), both eyes 02/09/2018   Cough 03/17/2017   Arthritis 12/16/2016   UTI (urinary tract infection)    Stroke [facility])    Sleep apnea    Right kidney stone    Hypertension    Hyperlipidemia    Heart murmur    Diabetes mellitus without complication ([place])    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   Cystocele with rectocele 08/21/2016   Mixed hyperlipidemia 08/08/2016   Controlled type 2 diabetes mellitus without complication, without long-term current use of insulin ([place]) 08/08/2016   Essential hypertension 08/08/2016   History of CVA (cerebrovascular accident) 08/08/2016   Nuclear sclerotic cataract of left eye 07/07/2016   OAB (overactive bladder) 01/20/2016   Morbid obesity ([place]) 11/10/2015   Contusion of right knee 09/09/2015   Quadriceps weakness 09/09/2015   Right shoulder pain 08/21/2015   IBS (irritable bowel syndrome) 07/16/2015   Insomnia 07/16/2015   Left ventricular hypertrophy 07/16/2015   Pulmonary hypertension ([place]) 07/16/2015   Primary osteoarthritis of left knee 03/31/2015   Acid reflux disease 03/09/2015   Aortic stenosis 03/09/2015  AR (allergic rhinitis) 03/09/2015   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   OSA (obstructive sleep apnea) 03/09/2015   Osteoporosis 03/09/2015   Vitamin D deficiency 03/09/2015   Left tibialis posterior tendinitis 12/13/2013   Aftercare following right knee joint replacement surgery 12/04/2013   Sprain of ankle, left 11/15/2013   Overweight 08/16/2013   Broken bones 08/13/2013    Past Medical History:  Diagnosis Date   Acid reflux disease 03/09/2015   Aortic stenosis 03/09/2015   AR (allergic rhinitis) 03/09/2015   Arthritis    Back pain    Controlled type 2  diabetes mellitus without complication, without long-term current use of insulin ([place]) 08/08/2016   Cough    Cystocele with rectocele 08/21/2016   Decreased hearing    Diabetes mellitus without complication ([facility])    Dry mouth    Easy bruising    Essential hypertension 08/08/2016   Heart murmur    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   History of CVA (cerebrovascular accident) 08/08/2016   Seen on MRI from 08/2015   Hyperlipidemia    Hypertension    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   IBS (irritable bowel syndrome) 07/16/2015   Insomnia 07/16/2015   Joint pain    Knee pain    Left ventricular hypertrophy 07/16/2015   Leg cramping    right leg   Mixed hyperlipidemia 08/08/2016   Morbid obesity ([place]) 11/10/2015   Nasal discharge    Nuclear sclerotic cataract of left eye 07/07/2016   OAB (overactive bladder) 01/20/2016   OSA (obstructive sleep apnea) 03/09/2015   Osteoporosis 03/09/2015   Primary osteoarthritis of left knee 03/31/2015   Pulmonary hypertension ([place]) 07/16/2015   Red eyes    Right kidney stone    Shortness of breath on exertion    Skin cancer 11/04/2020   Sleep apnea    Stroke ([place])    Swelling of both lower extremities    UTI (urinary tract infection)    Vitamin D deficiency 03/09/2015    Past Surgical History:  Procedure Laterality Date   ABDOMINAL HYSTERECTOMY  09/14/2016   CATARACT EXTRACTION Left    CATARACT EXTRACTION Left 06/2016   COLPORRHAPHY N/A 09/14/2016   [facility]   CYSTOCELE REPAIR     FRACTURE SURGERY Left 1953   L arm   FRACTURE SURGERY Left 1985   L ankle   HERNIA REPAIR  2004   JOINT REPLACEMENT Right 2012   R TKA   JOINT REPLACEMENT Left 2017   KIDNEY STONE SURGERY     LAPAROSCOPIC ASSISTED VAGINAL HYSTERECTOMY N/A 09/14/2016   [facility]   R foot/ankle Right 2013 and 2014   plate and screws lateral foot and tendon removal medial foot in 2013, revision in 2014   [place]     URETEROSCOPY      Social  History   Tobacco Use   Smoking status: Never   Smokeless tobacco: Never  Vaping Use   Vaping Use: Never used  Substance Use Topics   Alcohol use: Yes    Alcohol/week: 2.0 - 3.0 standard drinks    Types: 2 - 3 Glasses of wine per week    Comment: couple of glasses per week   Drug use: No    Family History  Problem Relation Age of Onset   Hyperlipidemia Mother    Heart disease Mother    Hypertension Mother    Stroke Mother    Thyroid disease Mother    Hypertension Father  Heart disease Father     Allergies  Allergen Reactions   Latex Itching   Ciprofloxacin Hives   Metronidazole Hives   Adhesive [Tape] Rash    Medication list has been reviewed and updated.  Current Outpatient Medications on File Prior to Visit  Medication Sig Dispense Refill   Accu-Chek Softclix Lancets lancets USE AS DIRECTED TO CHECK BLOOD GLUCOSE ONCE DAILY 100 each 3   Adhesive Tape (RA ADHESIVE 1"X10YD) TAPE 2 inch Paper tape roll 2 each 0   amoxicillin (AMOXIL) 500 MG capsule Take prior to dentist procedure     blood glucose meter kit and supplies KIT Dispense based on patient and insurance preference. Use once daily to monitor glucose as needed (FOR ICD-9 250.00, 250.01). 1 each 0   CALCIUM PO Take 1 tablet by mouth daily.     cephALEXin (KEFLEX) 250 MG capsule Take 1 capsule (250 mg total) by mouth daily. 90 capsule 1   Cholecalciferol (VITAMIN D3) 2000 units TABS Take by mouth. Take one tablet in the AM     clopidogrel (PLAVIX) 75 MG tablet Take 1 tablet (75 mg total) by mouth daily. 90 tablet 1   clotrimazole-betamethasone (LOTRISONE) cream Apply to plantar foot daily 45 g 11   desmopressin (DDAVP) 0.2 MG tablet Take 3 tablets (0.6 mg total) by mouth at bedtime. 90 tablet 3   desonide (DESOWEN) 0.05 % cream Apply a thin layer to the affected area twice daily for 2 weeks then stop for 1 week. Repeat for flares. 60 g 0   econazole nitrate 1 % cream Apply topically as needed.      fluticasone  (FLONASE) 50 MCG/ACT nasal spray Place 2 sprays into both nostrils daily. 48 g 3   Gauze Pads & Dressings (CURITY ABDOMINAL) 5"X9" PADS ABD pad- Apply twice a day to cover incision and secure with paper tape.  DO this once your Aquacel is removed. 20 each 0   glucose blood (ACCU-CHEK GUIDE) test strip USE AS DIRECTED TO CHECK BLOOD GLUCOSE ONCE DAILY 100 strip 3   losartan (COZAAR) 50 MG tablet Take 1 & 1/2 tablets (75 mg total) by mouth daily. 135 tablet 3   metFORMIN (GLUCOPHAGE) 500 MG tablet TAKE 1 TABLET (500 MG TOTAL) BY MOUTH 2 (TWO) TIMES DAILY WITH A MEAL. 180 tablet 1   mirabegron ER (MYRBETRIQ) 50 MG TB24 tablet Take 1 tablet (50 mg total) by mouth daily. 90 tablet 3   omega-3 acid ethyl esters (LOVAZA) 1 g capsule TAKE 2 CAPSULES BY MOUTH TWICE DAILY 360 capsule 2   omeprazole (PRILOSEC) 40 MG capsule Take 1 capsule (40 mg total) by mouth daily. 30 capsule 11   Probiotic Product (PROBIOTIC-10 PO) Take 1 capsule by mouth daily.     PSYLLIUM PO Take by mouth.     simvastatin (ZOCOR) 20 MG tablet TAKE 1 TABLET (20 MG TOTAL) BY MOUTH AT BEDTIME. 90 tablet 3   solifenacin (VESICARE) 10 MG tablet Take 1 tablet (10 mg total) by mouth daily. 30 tablet 1   vitamin C (ASCORBIC ACID) 500 MG tablet Take 1/2 tablet by mouth twice a day 100 tablet 0   Vitamin D, Ergocalciferol, (DRISDOL) 1.25 MG (50000 UNIT) CAPS capsule TAKE 1 CAPSULE (50,000 UNITS TOTAL) BY MOUTH EVERY 7 (SEVEN) DAYS. 4 capsule 1   No current facility-administered medications on file prior to visit.    Review of Systems:  As per HPI- otherwise negative.   Physical Examination: Vitals:   04/21/21 8676  BP: 122/70  Pulse: 74  Resp: 18  Temp: 97.7 F (36.5 C)  SpO2: 98%   Vitals:   04/21/21 0846  Weight: 237 lb (107.5 kg)  Height: _0  (1.549 m)   Body mass index is 44.78 kg/m. Ideal Body Weight: Weight in (lb) to have BMI = 25: 132  GEN: no acute distress.  Obese, looks well  HEENT: Atraumatic,  Normocephalic.  Ears and Nose: No external deformity. CV: RRR, No M/G/R. No JVD. No thrill. No extra heart sounds. PULM: CTA B, no wheezes, crackles, rhonchi. No retractions. No resp. distress. No accessory muscle use. ABD: S, NT, ND, +BS. No rebound. No HSM. EXTR: No c/c/e PSYCH: Normally interactive. Conversant.  Foot exam today- normal  Pt has 3 SK on her left cheek- all approx 2 mm in diameter.  They are irritating and uncomfortable to her   VC obtained  Liquid nitrogen x3 to SK skin lesions as described above.  Patient tolerated well with no complications  Assessment and Plan: Physical exam  Type 2 diabetes mellitus without complication, without long-term current use of insulin ([place]) - Plan: Comprehensive metabolic panel, Hemoglobin A1c  Vitamin D deficiency - Plan: VITAMIN D 25 Hydroxy (Vit-D Deficiency, Fractures)  Essential hypertension - Plan: CBC, TSH  Mixed hyperlipidemia - Plan: Lipid panel  Seborrheic keratoses   Patient seen today for physical exam.  Encouraged healthy diet and exercise routine Will plan further follow- up pending labs. Cryotherapy to irritated seborrheic keratoses as above  Signed Lamar Blinks, MD   Received labs as below, message to patient Results for orders placed or performed in visit on 04/21/21  CBC  Result Value Ref Range   WBC 5.9 4.0 - 10.5 K/uL   RBC 4.49 3.87 - 5.11 Mil/uL   Platelets 287.0 150.0 - 400.0 K/uL   Hemoglobin 12.4 12.0 - 15.0 g/dL   HCT 38.7 36.0 - 46.0 %   MCV 86.2 78.0 - 100.0 fl   MCHC 32.1 30.0 - 36.0 g/dL   RDW 16.0 (H) 11.5 - 15.5 %  Comprehensive metabolic panel  Result Value Ref Range   Sodium 138 135 - 145 mEq/L   Potassium 4.4 3.5 - 5.1 mEq/L   Chloride 102 96 - 112 mEq/L   CO2 30 19 - 32 mEq/L   Glucose, Bld 108 (H) 70 - 99 mg/dL   BUN 16 6 - 23 mg/dL   Creatinine, Ser 0.53 0.40 - 1.20 mg/dL   Total Bilirubin 0.8 0.2 - 1.2 mg/dL   Alkaline Phosphatase 52 39 - 117 U/L   AST 16 0 - 37 U/L    ALT 11 0 - 35 U/L   Total Protein 6.9 6.0 - 8.3 g/dL   Albumin 4.7 3.5 - 5.2 g/dL   GFR 90.02 >60.00 mL/min   Calcium 9.8 8.4 - 10.5 mg/dL  Hemoglobin A1c  Result Value Ref Range   Hgb A1c MFr Bld 6.2 4.6 - 6.5 %  Lipid panel  Result Value Ref Range   Cholesterol 153 0 - 200 mg/dL   Triglycerides 148.0 0.0 - 149.0 mg/dL   HDL 49.10 >39.00 mg/dL   VLDL 29.6 0.0 - 40.0 mg/dL   LDL Cholesterol 74 0 - 99 mg/dL   Total CHOL/HDL Ratio 3    NonHDL 103.70   TSH  Result Value Ref Range   TSH 1.83 0.35 - 5.50 uIU/mL  VITAMIN D 25 Hydroxy (Vit-D Deficiency, Fractures)  Result Value Ref Range   VITD 53.90 30.00 - 100.00  ng/mL

## 2021-04-18 NOTE — Patient Instructions (Addendum)
It was good to see you again today, I will be in touch with your labs.  Assuming all is well please see me in about 6 months ? ?I froze two skin lesions for you today- they should hopefully peel off over the next couple of weeks ?We can re-freeze if needed  ?

## 2021-04-21 ENCOUNTER — Encounter: Payer: Self-pay | Admitting: Family Medicine

## 2021-04-21 ENCOUNTER — Ambulatory Visit (INDEPENDENT_AMBULATORY_CARE_PROVIDER_SITE_OTHER): Payer: Medicare PPO | Admitting: Family Medicine

## 2021-04-21 ENCOUNTER — Other Ambulatory Visit (HOSPITAL_BASED_OUTPATIENT_CLINIC_OR_DEPARTMENT_OTHER): Payer: Self-pay | Admitting: Family Medicine

## 2021-04-21 VITALS — BP 122/70 | HR 74 | Temp 97.7°F | Resp 18 | Ht 61.0 in | Wt 237.0 lb

## 2021-04-21 DIAGNOSIS — E119 Type 2 diabetes mellitus without complications: Secondary | ICD-10-CM

## 2021-04-21 DIAGNOSIS — L989 Disorder of the skin and subcutaneous tissue, unspecified: Secondary | ICD-10-CM

## 2021-04-21 DIAGNOSIS — E559 Vitamin D deficiency, unspecified: Secondary | ICD-10-CM | POA: Diagnosis not present

## 2021-04-21 DIAGNOSIS — E782 Mixed hyperlipidemia: Secondary | ICD-10-CM

## 2021-04-21 DIAGNOSIS — Z0000 Encounter for general adult medical examination without abnormal findings: Secondary | ICD-10-CM | POA: Diagnosis not present

## 2021-04-21 DIAGNOSIS — Z1231 Encounter for screening mammogram for malignant neoplasm of breast: Secondary | ICD-10-CM

## 2021-04-21 DIAGNOSIS — L821 Other seborrheic keratosis: Secondary | ICD-10-CM

## 2021-04-21 DIAGNOSIS — I10 Essential (primary) hypertension: Secondary | ICD-10-CM | POA: Diagnosis not present

## 2021-04-21 LAB — CBC
HCT: 38.7 % (ref 36.0–46.0)
Hemoglobin: 12.4 g/dL (ref 12.0–15.0)
MCHC: 32.1 g/dL (ref 30.0–36.0)
MCV: 86.2 fl (ref 78.0–100.0)
Platelets: 287 10*3/uL (ref 150.0–400.0)
RBC: 4.49 Mil/uL (ref 3.87–5.11)
RDW: 16 % — ABNORMAL HIGH (ref 11.5–15.5)
WBC: 5.9 10*3/uL (ref 4.0–10.5)

## 2021-04-21 LAB — COMPREHENSIVE METABOLIC PANEL
ALT: 11 U/L (ref 0–35)
AST: 16 U/L (ref 0–37)
Albumin: 4.7 g/dL (ref 3.5–5.2)
Alkaline Phosphatase: 52 U/L (ref 39–117)
BUN: 16 mg/dL (ref 6–23)
CO2: 30 mEq/L (ref 19–32)
Calcium: 9.8 mg/dL (ref 8.4–10.5)
Chloride: 102 mEq/L (ref 96–112)
Creatinine, Ser: 0.53 mg/dL (ref 0.40–1.20)
GFR: 90.02 mL/min (ref >60.00)
Glucose, Bld: 108 mg/dL — ABNORMAL HIGH (ref 70–99)
Potassium: 4.4 mEq/L (ref 3.5–5.1)
Sodium: 138 mEq/L (ref 135–145)
Total Bilirubin: 0.8 mg/dL (ref 0.2–1.2)
Total Protein: 6.9 g/dL (ref 6.0–8.3)

## 2021-04-21 LAB — LIPID PANEL
Cholesterol: 153 mg/dL (ref 0–200)
HDL: 49.1 mg/dL (ref >39.00)
LDL Cholesterol: 74 mg/dL (ref 0–99)
NonHDL: 103.7
Total CHOL/HDL Ratio: 3
Triglycerides: 148 mg/dL (ref 0.0–149.0)
VLDL: 29.6 mg/dL (ref 0.0–40.0)

## 2021-04-21 LAB — TSH: TSH: 1.83 u[IU]/mL (ref 0.35–5.50)

## 2021-04-21 LAB — HEMOGLOBIN A1C: Hgb A1c MFr Bld: 6.2 % (ref 4.6–6.5)

## 2021-04-21 LAB — VITAMIN D 25 HYDROXY (VIT D DEFICIENCY, FRACTURES): VITD: 53.9 ng/mL (ref 30.00–100.00)

## 2021-04-22 ENCOUNTER — Ambulatory Visit (INDEPENDENT_AMBULATORY_CARE_PROVIDER_SITE_OTHER): Payer: Medicare PPO | Admitting: Family Medicine

## 2021-04-22 ENCOUNTER — Other Ambulatory Visit (HOSPITAL_BASED_OUTPATIENT_CLINIC_OR_DEPARTMENT_OTHER): Payer: Self-pay

## 2021-04-22 MED ORDER — SOLIFENACIN SUCCINATE 10 MG PO TABS
10.0000 mg | ORAL_TABLET | Freq: Every day | ORAL | 1 refills | Status: DC
  Filled 2021-04-23 – 2021-11-04 (×3): qty 30, 30d supply, fill #0
  Filled 2021-12-08: qty 30, 30d supply, fill #1
  Filled ????-??-??: fill #0

## 2021-04-23 ENCOUNTER — Other Ambulatory Visit (HOSPITAL_BASED_OUTPATIENT_CLINIC_OR_DEPARTMENT_OTHER): Payer: Self-pay

## 2021-04-23 MED ORDER — SOLIFENACIN SUCCINATE 10 MG PO TABS
10.0000 mg | ORAL_TABLET | Freq: Every day | ORAL | 0 refills | Status: DC
  Filled 2021-04-23 – 2021-05-05 (×3): qty 90, 90d supply, fill #0

## 2021-04-26 ENCOUNTER — Other Ambulatory Visit (HOSPITAL_BASED_OUTPATIENT_CLINIC_OR_DEPARTMENT_OTHER): Payer: Self-pay

## 2021-05-03 ENCOUNTER — Other Ambulatory Visit (HOSPITAL_BASED_OUTPATIENT_CLINIC_OR_DEPARTMENT_OTHER): Payer: Self-pay

## 2021-05-03 MED ORDER — PREDNISOLONE ACETATE 1 % OP SUSP
OPHTHALMIC | 3 refills | Status: DC
  Filled 2021-05-03: qty 5, 25d supply, fill #0
  Filled 2021-06-02: qty 5, 25d supply, fill #1

## 2021-05-03 MED ORDER — MOXIFLOXACIN HCL 0.5 % OP SOLN
OPHTHALMIC | 1 refills | Status: DC
  Filled 2021-05-03: qty 3, 15d supply, fill #0

## 2021-05-03 MED ORDER — KETOROLAC TROMETHAMINE 0.5 % OP SOLN
OPHTHALMIC | 1 refills | Status: DC
  Filled 2021-05-03: qty 5, 25d supply, fill #0

## 2021-05-04 ENCOUNTER — Other Ambulatory Visit (HOSPITAL_BASED_OUTPATIENT_CLINIC_OR_DEPARTMENT_OTHER): Payer: Self-pay

## 2021-05-05 ENCOUNTER — Other Ambulatory Visit (HOSPITAL_BASED_OUTPATIENT_CLINIC_OR_DEPARTMENT_OTHER): Payer: Self-pay

## 2021-05-10 ENCOUNTER — Encounter (INDEPENDENT_AMBULATORY_CARE_PROVIDER_SITE_OTHER): Payer: Self-pay | Admitting: Family Medicine

## 2021-05-10 ENCOUNTER — Other Ambulatory Visit (HOSPITAL_BASED_OUTPATIENT_CLINIC_OR_DEPARTMENT_OTHER): Payer: Self-pay

## 2021-05-10 ENCOUNTER — Ambulatory Visit (INDEPENDENT_AMBULATORY_CARE_PROVIDER_SITE_OTHER): Payer: Medicare PPO | Admitting: Family Medicine

## 2021-05-10 ENCOUNTER — Other Ambulatory Visit: Payer: Self-pay

## 2021-05-10 VITALS — BP 142/75 | HR 76 | Temp 97.9°F | Ht 61.0 in | Wt 235.0 lb

## 2021-05-10 DIAGNOSIS — I10 Essential (primary) hypertension: Secondary | ICD-10-CM | POA: Diagnosis not present

## 2021-05-10 DIAGNOSIS — E559 Vitamin D deficiency, unspecified: Secondary | ICD-10-CM

## 2021-05-10 DIAGNOSIS — E119 Type 2 diabetes mellitus without complications: Secondary | ICD-10-CM

## 2021-05-10 DIAGNOSIS — E669 Obesity, unspecified: Secondary | ICD-10-CM

## 2021-05-10 DIAGNOSIS — E1169 Type 2 diabetes mellitus with other specified complication: Secondary | ICD-10-CM | POA: Diagnosis not present

## 2021-05-10 DIAGNOSIS — Z6841 Body Mass Index (BMI) 40.0 and over, adult: Secondary | ICD-10-CM | POA: Diagnosis not present

## 2021-05-10 MED ORDER — VITAMIN D (ERGOCALCIFEROL) 1.25 MG (50000 UNIT) PO CAPS
ORAL_CAPSULE | ORAL | 1 refills | Status: DC
  Filled 2021-05-10: qty 4, 28d supply, fill #0
  Filled 2021-06-06: qty 4, 28d supply, fill #1

## 2021-05-13 NOTE — Progress Notes (Signed)
? ? ? ?Chief Complaint:  ? ?OBESITY ?Jasmin [name] is here to discuss her progress with her obesity treatment plan along with follow-up of her obesity related diagnoses. Jasmin [name] is on the Category 2 Plan and states she is following her eating plan approximately 70% of the time. Jasmin [name] states she is doing 0 minutes 0 times per week. ? ?Today's visit was #: 43 ?Starting weight: 248 lbs ?Starting date: 06/21/2017 ?Today's weight: 235 lbs ?Today's date: 05/10/2021 ?Total lbs lost to date: 90 ?Total lbs lost since last in-office visit: 0 ? ?Interim History: Jasmin [name] has been recovering from hip replacement surgery, and she had been doing well but had a setback recently. She had to rest more and she noticed some increase in boredom eating. She is working on getting back on track. ? ?Subjective:  ? ?1. Essential hypertension ?Jasmin [name]'s blood pressure is elevated today. Her hip is hurting with the change in the weather, and she feels this may be related. She denies chest pain or headache. ? ?2. Vitamin D deficiency ?Jasmin [name]'s recent Vit D level was at goal. She denies nausea, vomiting, or muscle weakness. ? ?3. Type 2 diabetes mellitus without complication, without long-term current use of insulin (Jasmin [name]) ?Jasmin [name]'s recent A1c was at goal. Her fasting blood sugars mostly range between 110-130, even with extra challenges recently. She denies hypoglycemia. ? ?Assessment/Plan:  ? ?1. Essential hypertension ?Kabrea will continue with diet and exercise, and we will recheck her blood pressure in 1 month. ? ?2. Vitamin D deficiency ?We will refill prescription Vitamin D for 2 months. We will recheck labs in 3 months. Jasmin [name] will follow-up for routine testing of Vitamin D, at least 2-3 times per year to avoid over-replacement. ? ?- Vitamin D, Ergocalciferol, (DRISDOL) 1.25 MG (50000 UNIT) CAPS capsule; TAKE 1 CAPSULE (50,000 UNITS TOTAL) BY MOUTH EVERY 7 (SEVEN) DAYS.  Dispense: 4 capsule; Refill: 1 ? ?3. Type 2 diabetes mellitus without  complication, without long-term current use of insulin (Jasmin [name]) ?Jasmin [name] will continue her diet and exercise, and we will continue to follow.  ? ?4. Obesity BMI today is 44.5 ?Jasmin [name] is currently in the action stage of change. As such, her goal is to continue with weight loss efforts. She has agreed to the Category 2 Plan.  ? ?Behavioral modification strategies: no skipping meals and meal planning and cooking strategies. ? ?Jasmin [name] has agreed to follow-up with our clinic in 5 weeks. She was informed of the importance of frequent follow-up visits to maximize her success with intensive lifestyle modifications for her multiple health conditions.  ? ?Objective:  ? ?Blood pressure (!) 142/75, pulse 76, temperature 97.9 ?F (36.6 ?C), height '5\' 1"'$  (1.549 m), weight 235 lb (106.6 kg), SpO2 99 %. ?Body mass index is 44.4 kg/m?. ? ?General: Cooperative, alert, well developed, in no acute distress. ?HEENT: Conjunctivae and lids unremarkable. ?Cardiovascular: Regular rhythm.  ?Lungs: Normal work of breathing. ?Neurologic: No focal deficits.  ? ?Lab Results  ?Component Value Date  ? CREATININE 0.53 04/21/2021  ? BUN 16 04/21/2021  ? NA 138 04/21/2021  ? K 4.4 04/21/2021  ? CL 102 04/21/2021  ? CO2 30 04/21/2021  ? ?Lab Results  ?Component Value Date  ? ALT 11 04/21/2021  ? AST 16 04/21/2021  ? ALKPHOS 52 04/21/2021  ? BILITOT 0.8 04/21/2021  ? ?Lab Results  ?Component Value Date  ? HGBA1C 6.2 04/21/2021  ? HGBA1C 6.3 10/19/2020  ? HGBA1C 6.4 04/20/2020  ? HGBA1C 6.1 (H) 10/16/2019  ? HGBA1C 6.1 (H) 07/01/2019  ? ?  Lab Results  ?Component Value Date  ? INSULIN 21.1 07/01/2019  ? INSULIN 24.2 09/28/2017  ? INSULIN 17.6 06/21/2017  ? ?Lab Results  ?Component Value Date  ? TSH 1.83 04/21/2021  ? ?Lab Results  ?Component Value Date  ? CHOL 153 04/21/2021  ? HDL 49.10 04/21/2021  ? [place] 74 04/21/2021  ? LDLDIRECT 122.0 04/20/2020  ? TRIG 148.0 04/21/2021  ? CHOLHDL 3 04/21/2021  ? ?Lab Results  ?Component Value Date  ? VD25OH 53.90  04/21/2021  ? VD25OH 38.16 04/20/2020  ? VD25OH 39.9 07/01/2019  ? ?Lab Results  ?Component Value Date  ? WBC 5.9 04/21/2021  ? HGB 12.4 04/21/2021  ? HCT 38.7 04/21/2021  ? MCV 86.2 04/21/2021  ? PLT 287.0 04/21/2021  ? ?No results found for: IRON, TIBC, FERRITIN ? ?Obesity Behavioral Intervention:  ? ?Approximately 15 minutes were spent on the discussion below. ? ?ASK: ?We discussed the diagnosis of obesity with Kadin today and Marcayla agreed to give [name] permission to discuss obesity behavioral modification therapy today. ? ?ASSESS: ?Kambrie has the diagnosis of obesity and her BMI today is 44.5. Jimmye is in the action stage of change.  ? ?ADVISE: ?Lizeth was educated on the multiple health risks of obesity as well as the benefit of weight loss to improve her health. She was advised of the need for long term treatment and the importance of lifestyle modifications to improve her current health and to decrease her risk of future health problems. ? ?AGREE: ?Multiple dietary modification options and treatment options were discussed and Akaiya agreed to follow the recommendations documented in the above note. ? ?ARRANGE: ?Seham was educated on the importance of frequent visits to treat obesity as outlined per CMS and USPSTF guidelines and agreed to schedule her next follow up appointment today. ? ?Attestation Statements:  ? ?Reviewed by clinician on day of visit: allergies, medications, problem list, medical history, surgical history, family history, social history, and previous encounter notes. ? ? ?I, Trixie Dredge, am acting as transcriptionist for Dennard Nip, MD. ? ?I have reviewed the above documentation for accuracy and completeness, and I agree with the above. -  Dennard Nip, MD ? ? ?

## 2021-05-17 ENCOUNTER — Other Ambulatory Visit (HOSPITAL_BASED_OUTPATIENT_CLINIC_OR_DEPARTMENT_OTHER): Payer: Self-pay

## 2021-05-18 DIAGNOSIS — Z96641 Presence of right artificial hip joint: Secondary | ICD-10-CM | POA: Diagnosis not present

## 2021-05-18 DIAGNOSIS — M47818 Spondylosis without myelopathy or radiculopathy, sacral and sacrococcygeal region: Secondary | ICD-10-CM | POA: Diagnosis not present

## 2021-05-18 DIAGNOSIS — Z471 Aftercare following joint replacement surgery: Secondary | ICD-10-CM | POA: Diagnosis not present

## 2021-05-18 DIAGNOSIS — M1612 Unilateral primary osteoarthritis, left hip: Secondary | ICD-10-CM | POA: Diagnosis not present

## 2021-05-18 DIAGNOSIS — I999 Unspecified disorder of circulatory system: Secondary | ICD-10-CM | POA: Diagnosis not present

## 2021-05-19 DIAGNOSIS — H2512 Age-related nuclear cataract, left eye: Secondary | ICD-10-CM | POA: Diagnosis not present

## 2021-05-19 DIAGNOSIS — H2511 Age-related nuclear cataract, right eye: Secondary | ICD-10-CM | POA: Diagnosis not present

## 2021-05-20 ENCOUNTER — Other Ambulatory Visit (INDEPENDENT_AMBULATORY_CARE_PROVIDER_SITE_OTHER): Payer: Medicare PPO

## 2021-05-20 ENCOUNTER — Other Ambulatory Visit (HOSPITAL_BASED_OUTPATIENT_CLINIC_OR_DEPARTMENT_OTHER): Payer: Self-pay

## 2021-05-20 ENCOUNTER — Encounter: Payer: Self-pay | Admitting: Family Medicine

## 2021-05-20 ENCOUNTER — Other Ambulatory Visit: Payer: Self-pay | Admitting: Family

## 2021-05-20 DIAGNOSIS — R300 Dysuria: Secondary | ICD-10-CM

## 2021-05-20 LAB — POCT URINALYSIS DIP (MANUAL ENTRY)
Blood, UA: NEGATIVE
Glucose, UA: 100 mg/dL — AB
Nitrite, UA: POSITIVE — AB
Spec Grav, UA: 1.015 (ref 1.010–1.025)
Urobilinogen, UA: 1 E.U./dL
pH, UA: 7.5 (ref 5.0–8.0)

## 2021-05-20 MED ORDER — SULFAMETHOXAZOLE-TRIMETHOPRIM 800-160 MG PO TABS
1.0000 | ORAL_TABLET | Freq: Two times a day (BID) | ORAL | 0 refills | Status: DC
  Filled 2021-05-20: qty 14, 7d supply, fill #0

## 2021-05-20 MED ORDER — SULFAMETHOXAZOLE-TRIMETHOPRIM 800-160 MG PO TABS
1.0000 | ORAL_TABLET | Freq: Two times a day (BID) | ORAL | 0 refills | Status: DC
  Filled 2021-05-20: qty 10, 5d supply, fill #0

## 2021-05-20 NOTE — Telephone Encounter (Signed)
Unable to hear pt, disconnected call and call back no answer. Awaiting providers response.  ?

## 2021-05-20 NOTE — Addendum Note (Signed)
Addended by: Lamar Blinks C on: 05/20/2021 04:22 PM ? ? Modules accepted: Orders ? ?

## 2021-05-20 NOTE — Telephone Encounter (Signed)
I have also called pt back and informed her that both providers have send the rx but she needs to pick up only one medication. She stated understanding and her husband is currently picking it up for her. Pt has scheduled an follow up visit to double check that the UTI is gone on 05/27/21 with Dr. Lorelei Pont.  ?

## 2021-05-20 NOTE — Telephone Encounter (Signed)
Called pt back and informed her copland is not in the office, however I have sent this message to Jasmin [name] to respond since pt is so much pain. She is having burning and discomfort.   ?

## 2021-05-20 NOTE — Telephone Encounter (Signed)
Called pt and let her know that I sent in an rx for her to the [facility] ? ?Meds ordered this encounter  ?Medications  ? sulfamethoxazole-trimethoprim (BACTRIM DS) 800-160 MG tablet  ?  Sig: Take 1 tablet by mouth 2 (two) times daily.  ?  Dispense:  14 tablet  ?  Refill:  0  ? ?She denies any fever or abd pain ?Asked her to let me know if not improving in the next day or so ?

## 2021-05-20 NOTE — Telephone Encounter (Signed)
Pt is waiting for labs to be reviewed. She is requesting if another dr could go over labs as Dr.Copland is not in the office. She is in a lot of discomfort. Please advise.  ?

## 2021-05-23 ENCOUNTER — Encounter: Payer: Self-pay | Admitting: Family Medicine

## 2021-05-23 LAB — URINE CULTURE
MICRO NUMBER:: 13201617
SPECIMEN QUALITY:: ADEQUATE

## 2021-05-24 ENCOUNTER — Ambulatory Visit (HOSPITAL_BASED_OUTPATIENT_CLINIC_OR_DEPARTMENT_OTHER): Payer: Medicare PPO

## 2021-05-25 NOTE — Progress Notes (Signed)
Therapist, music at [facility] ?[address], [address] ?[place],  [address] ?336 [phone] ?Fax [phone] ? ?Date:  05/27/2021  ? ?Name:  Jasmin [name]   DOB:  25-Dec-1945   MRN:  [id number] ? ?PCP:  Darreld Mclean, MD  ? ? ?Chief Complaint: uti symptoms  ? ? ?History of Present Illness: ? ?Jasmin [name] is a 76 y.o. very pleasant female patient who presents with the following: ? ?Patient seen today for concern of UTI follow-up ?History of well-controlled diabetes, hypertension, hyperlipidemia, stroke, OSA, obesity ?Last seen by myself March 1 for routine physical, she is also following up with the healthy weight clinic ?She contacted [name] in late March with concern of possible UTI-Septra called in for her ?Urine culture positive for drug-resistant Morganelli ? ?She is here today just to recheck her urine culture  ?She finished her abx yesterday- her sx are now resolved ?She continues to have urinary frequency but this is baseline for her  ?ISOLATE 1: Morganella morganii Abnormal    ?Comment: Greater than 100,000 CFU/mL of Morganella morganii  ?[place]  ?  ? ?Susceptibility ? ? Morganella morganii   ? URINE CULTURE, REFLEX   ? AMOX/CLAVULANIC >=32  Resistant   ? AMPICILLIN >=32  Resistant   ? AMPICILLIN/SULBACTAM >=32  Resistant   ? CEFAZOLIN >=64  Resistant 1   ? CEFEPIME <=1  Sensitive   ? CEFTAZIDIME <=1  Sensitive   ? CEFTRIAXONE <=1  Sensitive   ? CIPROFLOXACIN <=0.25  Sensitive   ? GENTAMICIN <=1  Sensitive   ? LEVOFLOXACIN <=0.12  Sensitive   ? NITROFURANTOIN 64  Resistant   ? PIP/TAZO <=4  Sensitive   ? TOBRAMYCIN <=1  Sensitive   ? TRIMETH/SULFA <=20  Sensitive 2   ?      ?   ?  ?  ? ? ?Patient Active Problem List  ? Diagnosis Date Noted  ? Skin cancer 11/04/2020  ? BMI 45.0-49.9, adult ([place]) 07/31/2020  ? Swelling of both lower extremities   ? Shortness of breath on exertion   ? Red eyes   ? Nasal discharge   ? Leg cramping   ? Knee pain   ?  Joint pain   ? Easy bruising   ? Dry mouth   ? Decreased hearing   ? Back pain   ? Urinary frequency 12/26/2018  ? Nocturia 12/26/2018  ? Recurrent UTI 12/26/2018  ? Vaginal atrophy 12/26/2018  ? Hyperopia with astigmatism and presbyopia, bilateral 02/09/2018  ? Long term current use of oral hypoglycemic drug 02/09/2018  ? PCO (posterior capsular opacification), left 02/09/2018  ? Pseudophakia of left eye 02/09/2018  ? PVD (posterior vitreous detachment), both eyes 02/09/2018  ? Cough 03/17/2017  ? Arthritis 12/16/2016  ? UTI (urinary tract infection)   ? Stroke [facility])   ? Sleep apnea   ? Right kidney stone   ? Hypertension   ? Hyperlipidemia   ? Heart murmur   ? Diabetes mellitus without complication ([place])   ? 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  ? Cystocele with rectocele 08/21/2016  ? Mixed hyperlipidemia 08/08/2016  ? Controlled type 2 diabetes mellitus without complication, without long-term current use of insulin ([place]) 08/08/2016  ? Essential hypertension 08/08/2016  ? History of CVA (cerebrovascular accident) 08/08/2016  ? Nuclear sclerotic cataract of left eye 07/07/2016  ? OAB (overactive bladder) 01/20/2016  ? Morbid obesity ([place]) 11/10/2015  ? Contusion of right knee  09/09/2015  ? Quadriceps weakness 09/09/2015  ? Right shoulder pain 08/21/2015  ? IBS (irritable bowel syndrome) 07/16/2015  ? Insomnia 07/16/2015  ? Left ventricular hypertrophy 07/16/2015  ? Pulmonary hypertension ([place]) 07/16/2015  ? Primary osteoarthritis of left knee 03/31/2015  ? Acid reflux disease 03/09/2015  ? Aortic stenosis 03/09/2015  ? AR (allergic rhinitis) 03/09/2015  ? 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  ? OSA (obstructive sleep apnea) 03/09/2015  ? Osteoporosis 03/09/2015  ? Vitamin D deficiency 03/09/2015  ? Left tibialis posterior tendinitis 12/13/2013  ? Aftercare following right knee joint replacement surgery 12/04/2013  ? Sprain of ankle, left 11/15/2013  ? Overweight 08/16/2013  ? Broken bones 08/13/2013   ? ? ?Past Medical History:  ?Diagnosis Date  ? Acid reflux disease 03/09/2015  ? Aortic stenosis 03/09/2015  ? AR (allergic rhinitis) 03/09/2015  ? Arthritis   ? Back pain   ? Controlled type 2 diabetes mellitus without complication, without long-term current use of insulin ([place]) 08/08/2016  ? Cough   ? Cystocele with rectocele 08/21/2016  ? Decreased hearing   ? Diabetes mellitus without complication ([place])   ? Dry mouth   ? Easy bruising   ? Essential hypertension 08/08/2016  ? Heart murmur   ? 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  ? History of CVA (cerebrovascular accident) 08/08/2016  ? Seen on MRI from 08/2015  ? Hyperlipidemia   ? Hypertension   ? 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  ? IBS (irritable bowel syndrome) 07/16/2015  ? Insomnia 07/16/2015  ? Joint pain   ? Knee pain   ? Left ventricular hypertrophy 07/16/2015  ? Leg cramping   ? right leg  ? Mixed hyperlipidemia 08/08/2016  ? Morbid obesity ([place]) 11/10/2015  ? Nasal discharge   ? Nuclear sclerotic cataract of left eye 07/07/2016  ? OAB (overactive bladder) 01/20/2016  ? OSA (obstructive sleep apnea) 03/09/2015  ? Osteoporosis 03/09/2015  ? Primary osteoarthritis of left knee 03/31/2015  ? Pulmonary hypertension ([place]) 07/16/2015  ? Red eyes   ? Right kidney stone   ? Shortness of breath on exertion   ? Skin cancer 11/04/2020  ? Sleep apnea   ? Stroke [facility])   ? Swelling of both lower extremities   ? UTI (urinary tract infection)   ? Vitamin D deficiency 03/09/2015  ? ? ?Past Surgical History:  ?Procedure Laterality Date  ? ABDOMINAL HYSTERECTOMY  09/14/2016  ? CATARACT EXTRACTION Left   ? CATARACT EXTRACTION Left 06/2016  ? COLPORRHAPHY N/A 09/14/2016  ? [facility]  ? CYSTOCELE REPAIR    ? FRACTURE SURGERY Left 1953  ? L arm  ? FRACTURE SURGERY Left 1985  ? L ankle  ? HERNIA REPAIR  2004  ? JOINT REPLACEMENT Right 2012  ? R TKA  ? JOINT REPLACEMENT Left 2017  ? KIDNEY STONE SURGERY    ? LAPAROSCOPIC ASSISTED VAGINAL HYSTERECTOMY N/A 09/14/2016  ? [facility]  ? R foot/ankle  Right 2013 and 2014  ? plate and screws lateral foot and tendon removal medial foot in 2013, revision in 2014  ? RECTOCELE REPAIR    ? URETERAL STENT PLACEMENT    ? URETEROSCOPY    ? ? ?Social History  ? ?Tobacco Use  ? Smoking status: Never  ? Smokeless tobacco: Never  ?Vaping Use  ? Vaping Use: Never used  ?Substance Use Topics  ? Alcohol use: Yes  ?  Alcohol/week: 2.0 - 3.0 standard drinks  ?  Types: 2 - 3 Glasses of wine per week  ?  Comment: couple of glasses per week  ? Drug use: No  ? ? ?Family History  ?Problem Relation Age of Onset  ? Hyperlipidemia Mother   ? Heart disease Mother   ? Hypertension Mother   ? Stroke Mother   ? Thyroid disease Mother   ? Hypertension Father   ? Heart disease Father   ? ? ?Allergies  ?Allergen Reactions  ? Latex Itching  ? Ciprofloxacin Hives  ? Metronidazole Hives  ? Adhesive [Tape] Rash  ? ? ?Medication list has been reviewed and updated. ? ?Current Outpatient Medications on File Prior to Visit  ?Medication Sig Dispense Refill  ? Accu-Chek Softclix Lancets lancets USE AS DIRECTED TO CHECK BLOOD GLUCOSE ONCE DAILY 100 each 3  ? Adhesive Tape (RA ADHESIVE 1"X10YD) TAPE 2 inch Paper tape roll 2 each 0  ? amoxicillin (AMOXIL) 500 MG capsule Take prior to dentist procedure    ? blood glucose meter kit and supplies KIT Dispense based on patient and insurance preference. Use once daily to monitor glucose as needed (FOR ICD-9 250.00, 250.01). 1 each 0  ? CALCIUM PO Take 1 tablet by mouth daily.    ? Cholecalciferol (VITAMIN D3) 2000 units TABS Take by mouth. Take one tablet in the AM    ? clopidogrel (PLAVIX) 75 MG tablet Take 1 tablet (75 mg total) by mouth daily. 90 tablet 1  ? clotrimazole-betamethasone (LOTRISONE) cream Apply to plantar foot daily 45 g 11  ? desmopressin (DDAVP) 0.2 MG tablet Take 3 tablets (0.6 mg total) by mouth at bedtime. 90 tablet 3  ? desonide (DESOWEN) 0.05 % cream Apply a thin layer to the affected area twice daily for 2 weeks then stop for 1 week.  Repeat for flares. 60 g 0  ? econazole nitrate 1 % cream Apply topically as needed.     ? fluticasone (FLONASE) 50 MCG/ACT nasal spray Place 2 sprays into both nostrils daily. 48 g 3  ? Gauze Pads & Dressings (

## 2021-05-26 ENCOUNTER — Other Ambulatory Visit (HOSPITAL_BASED_OUTPATIENT_CLINIC_OR_DEPARTMENT_OTHER): Payer: Self-pay

## 2021-05-27 ENCOUNTER — Ambulatory Visit: Payer: Medicare PPO | Admitting: Family Medicine

## 2021-05-27 VITALS — BP 145/70 | HR 90

## 2021-05-27 DIAGNOSIS — N390 Urinary tract infection, site not specified: Secondary | ICD-10-CM | POA: Diagnosis not present

## 2021-05-28 LAB — URINE CULTURE
MICRO NUMBER:: 13231360
Result:: NO GROWTH
SPECIMEN QUALITY:: ADEQUATE

## 2021-05-29 ENCOUNTER — Encounter: Payer: Self-pay | Admitting: Family Medicine

## 2021-05-31 ENCOUNTER — Encounter (HOSPITAL_BASED_OUTPATIENT_CLINIC_OR_DEPARTMENT_OTHER): Payer: Self-pay

## 2021-05-31 ENCOUNTER — Ambulatory Visit (HOSPITAL_BASED_OUTPATIENT_CLINIC_OR_DEPARTMENT_OTHER)
Admission: RE | Admit: 2021-05-31 | Discharge: 2021-05-31 | Disposition: A | Discharge status: Home / Self Care | Payer: Medicare PPO | Source: Ambulatory Visit | Attending: Family Medicine | Admitting: Family Medicine

## 2021-05-31 DIAGNOSIS — Z1231 Encounter for screening mammogram for malignant neoplasm of breast: Secondary | ICD-10-CM | POA: Insufficient documentation

## 2021-06-02 ENCOUNTER — Other Ambulatory Visit (HOSPITAL_BASED_OUTPATIENT_CLINIC_OR_DEPARTMENT_OTHER): Payer: Self-pay

## 2021-06-02 MED ORDER — MYRBETRIQ 50 MG PO TB24
50.0000 mg | ORAL_TABLET | Freq: Every day | ORAL | 0 refills | Status: DC
  Filled 2021-06-02: qty 90, 90d supply, fill #0

## 2021-06-07 ENCOUNTER — Other Ambulatory Visit (HOSPITAL_BASED_OUTPATIENT_CLINIC_OR_DEPARTMENT_OTHER): Payer: Self-pay

## 2021-06-14 ENCOUNTER — Ambulatory Visit (INDEPENDENT_AMBULATORY_CARE_PROVIDER_SITE_OTHER): Payer: Medicare PPO | Admitting: Family Medicine

## 2021-06-17 ENCOUNTER — Other Ambulatory Visit (HOSPITAL_BASED_OUTPATIENT_CLINIC_OR_DEPARTMENT_OTHER): Payer: Self-pay

## 2021-06-21 ENCOUNTER — Other Ambulatory Visit (HOSPITAL_BASED_OUTPATIENT_CLINIC_OR_DEPARTMENT_OTHER): Payer: Self-pay

## 2021-06-24 NOTE — Progress Notes (Signed)
?Cardiology Office Note:   ? ?Date:  06/25/2021  ? ?ID:  [name], DOB Feb 10, 1946, MRN [id number] ? ?PCP:  Darreld Mclean, MD  ?Cardiologist:  Shirlee More, MD   ? ?Referring MD: Darreld Mclean, MD  ? ? ?ASSESSMENT:   ? ?1. Nonrheumatic aortic valve stenosis   ?2. Essential hypertension   ?3. Mixed hyperlipidemia   ?4. RBBB   ? ?PLAN:   ? ?In order of problems listed above: ? ?Clinically stable recheck echocardiogram 18 months into the year follow-up in the office 1 year.  It be unusual to progress quickly from mild to moderate to severe over 2 office visits.  She remains asymptomatic ?She is taking an ARB is not trending blood pressure at home she will start to check home blood pressures and if her systolics remain greater than 140 she will contact me. ?Continue her statin ?New finding on today's EKG we will need to watch over time for any findings of progressive conduction system disease ? ? ?Next appointment: 1 year ? ? ?Medication Adjustments/Labs and Tests Ordered: ?Current medicines are reviewed at length with the patient today.  Concerns regarding medicines are outlined above.  ?Orders Placed This Encounter  ?Procedures  ? EKG 12-Lead  ? ECHOCARDIOGRAM COMPLETE  ? ?No orders of the defined types were placed in this encounter. ? ? ?Chief Complaint  ?Patient presents with  ? Follow-up  ? Aortic Stenosis  ? ? ?History of Present Illness:   ? ?Jasmin [name] is a 76 y.o. female with a hx of  calcific aortic stenosis hypertension hyperlipidemia   last seen 05/21/2020. ? ?Compliance with diet, lifestyle and medications: Yes ? ?In the interim she had a total hip arthroplasty in December great result quick recovery and subsequent cataract surgery ?No cardiovascular complications ?Back to full activity no angina dyspnea palpitation or syncope ? ?An echocardiogram performed 07/06/2020 left ventricle normal in size wall thickness EF 55 to 60% and indeterminate diastolic indices.  Right ventricle normal size  function and normal pulmonary artery pressure.  Aortic valve was abnormal with mild to moderate aortic regurgitation and moderate aortic stenosis mean gradient 18 mmHg valve area by VTI 1.18 cm?.  Left atrium is also mildly enlarged and there is mild mitral regurgitation. ? ? 1. Left ventricular ejection fraction, by estimation, is 55 to 60%. The  ?left ventricle has normal function. The left ventricle has no regional  ?wall motion abnormalities. Left ventricular diastolic parameters are  ?indeterminate.  ? 2. Right ventricular systolic function is normal. The right ventricular  ?size is normal. There is normal pulmonary artery systolic pressure.  ? 3. Left atrial size was mildly dilated.  ? 4. The mitral valve is normal in structure. Mild mitral valve  ?regurgitation. No evidence of mitral stenosis.  ? 5. The aortic valve is calcified. Aortic valve regurgitation is mild to  ?moderate. Moderate aortic valve stenosis. Aortic valve area, by VTI  ?measures 1.18 cm?[name] Aortic valve mean gradient measures 18.3 mmHg. Aortic  ?valve Vmax measures 2.64 m/s.  ? 6. Aortic dilatation noted. 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  ? 7. The inferior vena cava is normal in size with greater than 50%  ?respiratory variability, suggesting right atrial pressure of 3 mmHg.  ?Past Medical History:  ?Diagnosis Date  ? Acid reflux disease 03/09/2015  ? Aortic stenosis 03/09/2015  ? AR (allergic rhinitis) 03/09/2015  ? Arthritis   ? Back pain   ? Controlled type  2 diabetes mellitus without complication, without long-term current use of insulin ([place]) 08/08/2016  ? Cough   ? Cystocele with rectocele 08/21/2016  ? Decreased hearing   ? Diabetes mellitus without complication ([place])   ? Dry mouth   ? Easy bruising   ? Essential hypertension 08/08/2016  ? Heart murmur   ? 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  ? History of CVA (cerebrovascular accident) 08/08/2016  ? Seen on MRI from 08/2015  ? Hyperlipidemia   ?  Hypertension   ? 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  ? IBS (irritable bowel syndrome) 07/16/2015  ? Insomnia 07/16/2015  ? Joint pain   ? Knee pain   ? Left ventricular hypertrophy 07/16/2015  ? Leg cramping   ? right leg  ? Mixed hyperlipidemia 08/08/2016  ? Morbid obesity ([place]) 11/10/2015  ? Nasal discharge   ? Nuclear sclerotic cataract of left eye 07/07/2016  ? OAB (overactive bladder) 01/20/2016  ? OSA (obstructive sleep apnea) 03/09/2015  ? Osteoporosis 03/09/2015  ? Primary osteoarthritis of left knee 03/31/2015  ? Pulmonary hypertension ([place]) 07/16/2015  ? Red eyes   ? Right kidney stone   ? Shortness of breath on exertion   ? Skin cancer 11/04/2020  ? Sleep apnea   ? Stroke [facility])   ? Swelling of both lower extremities   ? UTI (urinary tract infection)   ? Vitamin D deficiency 03/09/2015  ? ? ?Past Surgical History:  ?Procedure Laterality Date  ? ABDOMINAL HYSTERECTOMY  09/14/2016  ? CATARACT EXTRACTION Left   ? CATARACT EXTRACTION Left 06/2016  ? COLPORRHAPHY N/A 09/14/2016  ? [facility]  ? CYSTOCELE REPAIR    ? FRACTURE SURGERY Left 1953  ? L arm  ? FRACTURE SURGERY Left 1985  ? L ankle  ? HERNIA REPAIR  2004  ? JOINT REPLACEMENT Right 2012  ? R TKA  ? JOINT REPLACEMENT Left 2017  ? KIDNEY STONE SURGERY    ? LAPAROSCOPIC ASSISTED VAGINAL HYSTERECTOMY N/A 09/14/2016  ? [facility]  ? R foot/ankle Right 2013 and 2014  ? plate and screws lateral foot and tendon removal medial foot in 2013, revision in 2014  ? RECTOCELE REPAIR    ? URETERAL STENT PLACEMENT    ? URETEROSCOPY    ? ? ?Current Medications: ?Current Meds  ?Medication Sig  ? Accu-Chek Softclix Lancets lancets USE AS DIRECTED TO CHECK BLOOD GLUCOSE ONCE DAILY  ? Adhesive Tape (RA ADHESIVE 1"X10YD) TAPE 2 inch Paper tape roll  ? amoxicillin (AMOXIL) 500 MG capsule Take prior to dentist procedure  ? blood glucose meter kit and supplies KIT Dispense based on patient and insurance preference. Use once daily to monitor glucose as needed (FOR ICD-9 250.00, 250.01).  ? CALCIUM PO Take  1 tablet by mouth daily.  ? Cholecalciferol (VITAMIN D3) 2000 units TABS Take by mouth. Take one tablet in the AM  ? clopidogrel (PLAVIX) 75 MG tablet Take 1 tablet (75 mg total) by mouth daily.  ? clotrimazole-betamethasone (LOTRISONE) cream Apply to plantar foot daily  ? desmopressin (DDAVP) 0.2 MG tablet Take 3 tablets (0.6 mg total) by mouth at bedtime.  ? desonide (DESOWEN) 0.05 % cream Apply a thin layer to the affected area twice daily for 2 weeks then stop for 1 week. Repeat for flares.  ? econazole nitrate 1 % cream Apply topically as needed.   ? fluticasone (FLONASE) 50 MCG/ACT nasal spray Place 2 sprays into both nostrils daily.  ? Gauze Pads & Dressings (CURITY ABDOMINAL) 5"X9" PADS  ABD pad- Apply twice a day to cover incision and secure with paper tape.  DO this once your Aquacel is removed.  ? glucose blood (ACCU-CHEK GUIDE) test strip USE AS DIRECTED TO CHECK BLOOD GLUCOSE ONCE DAILY  ? losartan (COZAAR) 50 MG tablet Take 1 & 1/2 tablets (75 mg total) by mouth daily.  ? metFORMIN (GLUCOPHAGE) 500 MG tablet TAKE 1 TABLET (500 MG TOTAL) BY MOUTH 2 (TWO) TIMES DAILY WITH A MEAL.  ? mirabegron ER (MYRBETRIQ) 50 MG TB24 tablet Take 1 tablet (50 mg total) by mouth daily.  ? omega-3 acid ethyl esters (LOVAZA) 1 g capsule TAKE 2 CAPSULES BY MOUTH TWICE DAILY  ? omeprazole (PRILOSEC) 40 MG capsule Take 1 capsule (40 mg total) by mouth daily.  ? Probiotic Product (PROBIOTIC-10 PO) Take 1 capsule by mouth daily.  ? PSYLLIUM PO Take by mouth.  ? simvastatin (ZOCOR) 20 MG tablet TAKE 1 TABLET (20 MG TOTAL) BY MOUTH AT BEDTIME.  ? solifenacin (VESICARE) 10 MG tablet Take 1 tablet (10 mg total) by mouth daily.  ? vitamin C (ASCORBIC ACID) 500 MG tablet Take 1/2 tablet by mouth twice a day  ? Vitamin D, Ergocalciferol, (DRISDOL) 1.25 MG (50000 UNIT) CAPS capsule TAKE 1 CAPSULE (50,000 UNITS TOTAL) BY MOUTH EVERY 7 (SEVEN) DAYS.  ?  ? ?Allergies:   Latex, Ciprofloxacin, Metronidazole, and Adhesive [tape]  ? ?Social  History  ? ?Socioeconomic History  ? Marital status: Married  ?  Spouse name: Lamonda Noxon  ? Number of children: 1  ? Years of education: Not on file  ? Highest education level: Not on file  ?Delford Field

## 2021-06-25 ENCOUNTER — Encounter: Payer: Self-pay | Admitting: Cardiology

## 2021-06-25 ENCOUNTER — Ambulatory Visit: Payer: Medicare PPO | Admitting: Cardiology

## 2021-06-25 VITALS — BP 150/74 | HR 88 | Ht 61.0 in | Wt 241.0 lb

## 2021-06-25 DIAGNOSIS — I10 Essential (primary) hypertension: Secondary | ICD-10-CM | POA: Diagnosis not present

## 2021-06-25 DIAGNOSIS — E782 Mixed hyperlipidemia: Secondary | ICD-10-CM

## 2021-06-25 DIAGNOSIS — I4510 Unspecified right bundle-branch block: Secondary | ICD-10-CM

## 2021-06-25 DIAGNOSIS — I350 Nonrheumatic aortic (valve) stenosis: Secondary | ICD-10-CM | POA: Diagnosis not present

## 2021-06-25 NOTE — Patient Instructions (Signed)
Medication Instructions:  ?Your physician recommends that you continue on your current medications as directed. Please refer to the Current Medication list given to you today. ? ?*If you need a refill on your cardiac medications before your next appointment, please call your pharmacy* ? ? ?Lab Work: ?None ?If you have labs (blood work) drawn today and your tests are completely normal, you will receive your results only by: ?MyChart Message (if you have MyChart) OR ?A paper copy in the mail ?If you have any lab test that is abnormal or we need to change your treatment, we will call you to review the results. ? ? ?Testing/Procedures: ?Your physician has requested that you have an echocardiogram. Echocardiography is a painless test that uses sound waves to create images of your heart. It provides your doctor with information about the size and shape of your heart and how well your heart?s chambers and valves are working. This procedure takes approximately one hour. There are no restrictions for this procedure. ? ? ? ?Follow-Up: ?At [facility], you and your health needs are our priority.  As part of our continuing mission to provide you with exceptional heart care, we have created designated Provider Care Teams.  These Care Teams include your primary Cardiologist (physician) and Advanced Practice Providers (APPs -  Physician Assistants and Nurse Practitioners) who all work together to provide you with the care you need, when you need it. ? ?We recommend signing up for the patient portal called "MyChart".  Sign up information is provided on this After Visit Summary.  MyChart is used to connect with patients for Virtual Visits (Telemedicine).  Patients are able to view lab/test results, encounter notes, upcoming appointments, etc.  Non-urgent messages can be sent to your provider as well.   ?To learn more about what you can do with MyChart, go to [email or web address].   ? ?Your next appointment:   ?1 year(s) ? ?The  format for your next appointment:   ?In Person ? ?Provider:   ?Shirlee More, MD  ? ? ?Other Instructions ?Check home blood pressures. Send MyChart message if blood pressure remains greater than 433 systolic. ? ?Important Information About Sugar ? ? ? ? ? ?

## 2021-06-28 ENCOUNTER — Other Ambulatory Visit (HOSPITAL_BASED_OUTPATIENT_CLINIC_OR_DEPARTMENT_OTHER): Payer: Self-pay

## 2021-06-28 DIAGNOSIS — Z96641 Presence of right artificial hip joint: Secondary | ICD-10-CM | POA: Diagnosis not present

## 2021-06-28 DIAGNOSIS — M25552 Pain in left hip: Secondary | ICD-10-CM | POA: Diagnosis not present

## 2021-06-28 DIAGNOSIS — M25462 Effusion, left knee: Secondary | ICD-10-CM | POA: Diagnosis not present

## 2021-06-28 DIAGNOSIS — M25562 Pain in left knee: Secondary | ICD-10-CM | POA: Diagnosis not present

## 2021-06-28 DIAGNOSIS — M47818 Spondylosis without myelopathy or radiculopathy, sacral and sacrococcygeal region: Secondary | ICD-10-CM | POA: Diagnosis not present

## 2021-06-28 DIAGNOSIS — M1612 Unilateral primary osteoarthritis, left hip: Secondary | ICD-10-CM | POA: Diagnosis not present

## 2021-06-28 DIAGNOSIS — M8588 Other specified disorders of bone density and structure, other site: Secondary | ICD-10-CM | POA: Diagnosis not present

## 2021-06-28 DIAGNOSIS — Z96652 Presence of left artificial knee joint: Secondary | ICD-10-CM | POA: Diagnosis not present

## 2021-06-28 DIAGNOSIS — M79605 Pain in left leg: Secondary | ICD-10-CM | POA: Diagnosis not present

## 2021-06-28 MED ORDER — METHYLPREDNISOLONE 4 MG PO TBPK
ORAL_TABLET | ORAL | 0 refills | Status: DC
  Filled 2021-06-28: qty 21, 6d supply, fill #0

## 2021-06-29 ENCOUNTER — Other Ambulatory Visit (HOSPITAL_BASED_OUTPATIENT_CLINIC_OR_DEPARTMENT_OTHER): Payer: Self-pay

## 2021-06-29 MED ORDER — DESMOPRESSIN ACETATE 0.2 MG PO TABS
ORAL_TABLET | ORAL | 0 refills | Status: DC
  Filled 2021-06-29: qty 270, 90d supply, fill #0

## 2021-06-29 MED ORDER — CEPHALEXIN 250 MG PO CAPS
ORAL_CAPSULE | ORAL | 0 refills | Status: DC
  Filled 2021-06-29: qty 90, 90d supply, fill #0

## 2021-07-05 ENCOUNTER — Ambulatory Visit (INDEPENDENT_AMBULATORY_CARE_PROVIDER_SITE_OTHER): Payer: Medicare PPO | Admitting: Family Medicine

## 2021-07-05 ENCOUNTER — Other Ambulatory Visit (HOSPITAL_BASED_OUTPATIENT_CLINIC_OR_DEPARTMENT_OTHER): Payer: Self-pay

## 2021-07-05 DIAGNOSIS — M47816 Spondylosis without myelopathy or radiculopathy, lumbar region: Secondary | ICD-10-CM | POA: Diagnosis not present

## 2021-07-05 DIAGNOSIS — M5432 Sciatica, left side: Secondary | ICD-10-CM | POA: Insufficient documentation

## 2021-07-05 DIAGNOSIS — M5442 Lumbago with sciatica, left side: Secondary | ICD-10-CM | POA: Diagnosis not present

## 2021-07-05 DIAGNOSIS — M79605 Pain in left leg: Secondary | ICD-10-CM | POA: Diagnosis not present

## 2021-07-05 MED ORDER — GABAPENTIN 100 MG PO CAPS
ORAL_CAPSULE | ORAL | 0 refills | Status: DC
  Filled 2021-07-05: qty 90, 30d supply, fill #0

## 2021-07-06 DIAGNOSIS — R2689 Other abnormalities of gait and mobility: Secondary | ICD-10-CM | POA: Diagnosis not present

## 2021-07-06 DIAGNOSIS — R29898 Other symptoms and signs involving the musculoskeletal system: Secondary | ICD-10-CM | POA: Diagnosis not present

## 2021-07-06 DIAGNOSIS — R6889 Other general symptoms and signs: Secondary | ICD-10-CM | POA: Diagnosis not present

## 2021-07-06 DIAGNOSIS — M1611 Unilateral primary osteoarthritis, right hip: Secondary | ICD-10-CM | POA: Diagnosis not present

## 2021-07-06 DIAGNOSIS — M6281 Muscle weakness (generalized): Secondary | ICD-10-CM | POA: Diagnosis not present

## 2021-07-06 DIAGNOSIS — R262 Difficulty in walking, not elsewhere classified: Secondary | ICD-10-CM | POA: Diagnosis not present

## 2021-07-08 DIAGNOSIS — M6281 Muscle weakness (generalized): Secondary | ICD-10-CM | POA: Diagnosis not present

## 2021-07-08 DIAGNOSIS — R6889 Other general symptoms and signs: Secondary | ICD-10-CM | POA: Diagnosis not present

## 2021-07-08 DIAGNOSIS — R2689 Other abnormalities of gait and mobility: Secondary | ICD-10-CM | POA: Diagnosis not present

## 2021-07-08 DIAGNOSIS — M1611 Unilateral primary osteoarthritis, right hip: Secondary | ICD-10-CM | POA: Diagnosis not present

## 2021-07-08 DIAGNOSIS — R262 Difficulty in walking, not elsewhere classified: Secondary | ICD-10-CM | POA: Diagnosis not present

## 2021-07-08 DIAGNOSIS — R29898 Other symptoms and signs involving the musculoskeletal system: Secondary | ICD-10-CM | POA: Diagnosis not present

## 2021-07-12 ENCOUNTER — Other Ambulatory Visit (HOSPITAL_BASED_OUTPATIENT_CLINIC_OR_DEPARTMENT_OTHER): Payer: Self-pay

## 2021-07-13 DIAGNOSIS — M6281 Muscle weakness (generalized): Secondary | ICD-10-CM | POA: Diagnosis not present

## 2021-07-13 DIAGNOSIS — R262 Difficulty in walking, not elsewhere classified: Secondary | ICD-10-CM | POA: Diagnosis not present

## 2021-07-13 DIAGNOSIS — R29898 Other symptoms and signs involving the musculoskeletal system: Secondary | ICD-10-CM | POA: Diagnosis not present

## 2021-07-13 DIAGNOSIS — R2689 Other abnormalities of gait and mobility: Secondary | ICD-10-CM | POA: Diagnosis not present

## 2021-07-13 DIAGNOSIS — R6889 Other general symptoms and signs: Secondary | ICD-10-CM | POA: Diagnosis not present

## 2021-07-13 DIAGNOSIS — M1611 Unilateral primary osteoarthritis, right hip: Secondary | ICD-10-CM | POA: Diagnosis not present

## 2021-07-14 ENCOUNTER — Other Ambulatory Visit (HOSPITAL_BASED_OUTPATIENT_CLINIC_OR_DEPARTMENT_OTHER): Payer: Self-pay

## 2021-07-15 DIAGNOSIS — R2689 Other abnormalities of gait and mobility: Secondary | ICD-10-CM | POA: Diagnosis not present

## 2021-07-15 DIAGNOSIS — R6889 Other general symptoms and signs: Secondary | ICD-10-CM | POA: Diagnosis not present

## 2021-07-15 DIAGNOSIS — R262 Difficulty in walking, not elsewhere classified: Secondary | ICD-10-CM | POA: Diagnosis not present

## 2021-07-15 DIAGNOSIS — M6281 Muscle weakness (generalized): Secondary | ICD-10-CM | POA: Diagnosis not present

## 2021-07-15 DIAGNOSIS — M1611 Unilateral primary osteoarthritis, right hip: Secondary | ICD-10-CM | POA: Diagnosis not present

## 2021-07-15 DIAGNOSIS — R29898 Other symptoms and signs involving the musculoskeletal system: Secondary | ICD-10-CM | POA: Diagnosis not present

## 2021-07-20 DIAGNOSIS — M1611 Unilateral primary osteoarthritis, right hip: Secondary | ICD-10-CM | POA: Diagnosis not present

## 2021-07-20 DIAGNOSIS — R29898 Other symptoms and signs involving the musculoskeletal system: Secondary | ICD-10-CM | POA: Diagnosis not present

## 2021-07-20 DIAGNOSIS — M6281 Muscle weakness (generalized): Secondary | ICD-10-CM | POA: Diagnosis not present

## 2021-07-20 DIAGNOSIS — R6889 Other general symptoms and signs: Secondary | ICD-10-CM | POA: Diagnosis not present

## 2021-07-20 DIAGNOSIS — R2689 Other abnormalities of gait and mobility: Secondary | ICD-10-CM | POA: Diagnosis not present

## 2021-07-20 DIAGNOSIS — R262 Difficulty in walking, not elsewhere classified: Secondary | ICD-10-CM | POA: Diagnosis not present

## 2021-07-22 DIAGNOSIS — M545 Low back pain, unspecified: Secondary | ICD-10-CM | POA: Diagnosis not present

## 2021-07-22 DIAGNOSIS — M79605 Pain in left leg: Secondary | ICD-10-CM | POA: Diagnosis not present

## 2021-07-22 DIAGNOSIS — M1611 Unilateral primary osteoarthritis, right hip: Secondary | ICD-10-CM | POA: Diagnosis not present

## 2021-07-23 ENCOUNTER — Other Ambulatory Visit (HOSPITAL_BASED_OUTPATIENT_CLINIC_OR_DEPARTMENT_OTHER): Payer: Self-pay

## 2021-07-26 ENCOUNTER — Ambulatory Visit: Payer: Medicare PPO | Attending: Internal Medicine

## 2021-07-26 DIAGNOSIS — Z23 Encounter for immunization: Secondary | ICD-10-CM

## 2021-07-26 NOTE — Progress Notes (Signed)
   Covid-19 Vaccination Clinic  Name:  Jasmin [name]    MRN: [id number] DOB: [birth year]  07/26/2021  Jasmin [name] was observed post Covid-19 immunization for 15 minutes without incident. She was provided with Vaccine Information Sheet and instruction to access the V-Safe system.   Jasmin [name] was instructed to call 911 with any severe reactions post vaccine: Difficulty breathing  Swelling of face and throat  A fast heartbeat  A bad rash all over body  Dizziness and weakness   Immunizations Administered     Name Date Dose VIS Date Route   Pfizer Covid-19 Vaccine Bivalent Booster 07/26/2021  9:01 AM 0.3 mL 10/21/2020 Intramuscular   Manufacturer: [place]   Lot: [id number]   [place]: [phone]

## 2021-07-27 DIAGNOSIS — M545 Low back pain, unspecified: Secondary | ICD-10-CM | POA: Diagnosis not present

## 2021-07-27 DIAGNOSIS — M79605 Pain in left leg: Secondary | ICD-10-CM | POA: Diagnosis not present

## 2021-07-27 DIAGNOSIS — M1611 Unilateral primary osteoarthritis, right hip: Secondary | ICD-10-CM | POA: Diagnosis not present

## 2021-07-30 ENCOUNTER — Other Ambulatory Visit (HOSPITAL_BASED_OUTPATIENT_CLINIC_OR_DEPARTMENT_OTHER): Payer: Self-pay

## 2021-07-30 DIAGNOSIS — Z6841 Body Mass Index (BMI) 40.0 and over, adult: Secondary | ICD-10-CM | POA: Diagnosis not present

## 2021-07-30 DIAGNOSIS — R800 Isolated proteinuria: Secondary | ICD-10-CM | POA: Diagnosis not present

## 2021-07-30 DIAGNOSIS — N3281 Overactive bladder: Secondary | ICD-10-CM | POA: Diagnosis not present

## 2021-07-30 DIAGNOSIS — R351 Nocturia: Secondary | ICD-10-CM | POA: Diagnosis not present

## 2021-07-30 DIAGNOSIS — N812 Incomplete uterovaginal prolapse: Secondary | ICD-10-CM | POA: Diagnosis not present

## 2021-07-30 DIAGNOSIS — R82998 Other abnormal findings in urine: Secondary | ICD-10-CM | POA: Diagnosis not present

## 2021-07-30 DIAGNOSIS — N390 Urinary tract infection, site not specified: Secondary | ICD-10-CM | POA: Diagnosis not present

## 2021-07-30 MED ORDER — MYRBETRIQ 50 MG PO TB24
50.0000 mg | ORAL_TABLET | Freq: Every day | ORAL | 0 refills | Status: DC
  Filled 2021-08-30: qty 90, 90d supply, fill #0

## 2021-07-30 MED ORDER — ESTRADIOL 0.1 MG/GM VA CREA
TOPICAL_CREAM | VAGINAL | 3 refills | Status: DC
  Filled 2021-07-30: qty 42.5, 30d supply, fill #0
  Filled 2021-08-30: qty 42.5, 30d supply, fill #1

## 2021-07-30 MED ORDER — PFIZER COVID-19 VAC BIVALENT 30 MCG/0.3ML IM SUSP
INTRAMUSCULAR | 0 refills | Status: DC
  Filled 2021-07-30: qty 0.3, 1d supply, fill #0

## 2021-07-30 MED ORDER — DESMOPRESSIN ACETATE 0.2 MG PO TABS
ORAL_TABLET | ORAL | 3 refills | Status: DC
  Filled 2021-11-04: qty 90, 90d supply, fill #0
  Filled 2022-01-31: qty 90, 90d supply, fill #1
  Filled 2022-05-09: qty 90, 90d supply, fill #2

## 2021-07-30 MED ORDER — SOLIFENACIN SUCCINATE 10 MG PO TABS
10.0000 mg | ORAL_TABLET | Freq: Every day | ORAL | 3 refills | Status: DC
  Filled 2021-07-30: qty 90, 90d supply, fill #0

## 2021-08-02 ENCOUNTER — Ambulatory Visit (INDEPENDENT_AMBULATORY_CARE_PROVIDER_SITE_OTHER): Payer: Medicare PPO | Admitting: Family Medicine

## 2021-08-02 ENCOUNTER — Other Ambulatory Visit (HOSPITAL_BASED_OUTPATIENT_CLINIC_OR_DEPARTMENT_OTHER): Payer: Self-pay

## 2021-08-02 ENCOUNTER — Encounter (INDEPENDENT_AMBULATORY_CARE_PROVIDER_SITE_OTHER): Payer: Self-pay | Admitting: Family Medicine

## 2021-08-02 VITALS — BP 143/73 | HR 77 | Temp 98.4°F | Ht 61.0 in | Wt 235.0 lb

## 2021-08-02 DIAGNOSIS — E669 Obesity, unspecified: Secondary | ICD-10-CM | POA: Diagnosis not present

## 2021-08-02 DIAGNOSIS — Z6841 Body Mass Index (BMI) 40.0 and over, adult: Secondary | ICD-10-CM

## 2021-08-02 DIAGNOSIS — N3281 Overactive bladder: Secondary | ICD-10-CM

## 2021-08-02 DIAGNOSIS — E559 Vitamin D deficiency, unspecified: Secondary | ICD-10-CM

## 2021-08-02 MED ORDER — VITAMIN D (ERGOCALCIFEROL) 1.25 MG (50000 UNIT) PO CAPS
ORAL_CAPSULE | ORAL | 1 refills | Status: DC
  Filled 2021-08-02: qty 4, 28d supply, fill #0
  Filled 2021-08-16 – 2021-08-23 (×2): qty 4, 28d supply, fill #1

## 2021-08-03 DIAGNOSIS — M545 Low back pain, unspecified: Secondary | ICD-10-CM | POA: Diagnosis not present

## 2021-08-03 DIAGNOSIS — M1611 Unilateral primary osteoarthritis, right hip: Secondary | ICD-10-CM | POA: Diagnosis not present

## 2021-08-03 DIAGNOSIS — M79605 Pain in left leg: Secondary | ICD-10-CM | POA: Diagnosis not present

## 2021-08-03 NOTE — Progress Notes (Unsigned)
Chief Complaint:   OBESITY Jasmin [name] is here to discuss her progress with her obesity treatment plan along with follow-up of her obesity related diagnoses. Jasmin [name] is on the Category 2 Plan and states she is following her eating plan approximately 50% of the time. Atara states she is walking for varies minutes 7 times per week.  Today's visit was #: 32 Starting weight: 248 lbs Starting date: 06/21/2017 Today's weight: 235 lbs Today's date: 08/02/2021 Total lbs lost to date: 13 Total lbs lost since last in-office visit: 0  Interim History: Jasmin [name] has been recovering from hip replacement surgery. She noted increased snacking while inactive, but she has done well with maintaining her weight. Her hunger is mostly controlled, and she is now able to increase her activity.   Subjective:   1. Vitamin D deficiency Jasmin [name] is on vitamin D, with no side effects noted.  She is gardening now and is getting more sun exposure.  2. OAB (overactive bladder) Jasmin [name] has questions about Botox for her bladder.  She has problems with sciatica and she has some concerns.  Assessment/Plan:   1. Vitamin D deficiency We will refill prescription Vitamin D for 2 months. Jasmin [name] will follow-up for routine testing of Vitamin D, at least 2-3 times per year to avoid over-replacement.  - Vitamin D, Ergocalciferol, (DRISDOL) 1.25 MG (50000 UNIT) CAPS capsule; TAKE 1 CAPSULE (50,000 UNITS TOTAL) BY MOUTH EVERY 7 (SEVEN) DAYS.  Dispense: 4 capsule; Refill: 1  2. OAB (overactive bladder) Jasmin [name] was reassured that this should not be a problem.   3. Obesity, Current BMI 44.4 Jasmin [name] is currently in the action stage of change. As such, her goal is to continue with weight loss efforts. She has agreed to the Category 2 Plan.   Exercise goals: As is.  Behavioral modification strategies: increasing lean protein intake.  Jasmin [name] has agreed to follow-up with our clinic in 4 weeks. She was informed of the importance  of frequent follow-up visits to maximize her success with intensive lifestyle modifications for her multiple health conditions.   Objective:   Blood pressure (!) 143/73, pulse 77, temperature 98.4 F (36.9 C), height '5\' 1"'$  (1.549 m), weight 235 lb (106.6 kg), SpO2 98 %. Body mass index is 44.4 kg/m.  General: Cooperative, alert, well developed, in no acute distress. HEENT: Conjunctivae and lids unremarkable. Cardiovascular: Regular rhythm.  Lungs: Normal work of breathing. Neurologic: No focal deficits.   Lab Results  Component Value Date   CREATININE 0.53 04/21/2021   BUN 16 04/21/2021   NA 138 04/21/2021   K 4.4 04/21/2021   CL 102 04/21/2021   CO2 30 04/21/2021   Lab Results  Component Value Date   ALT 11 04/21/2021   AST 16 04/21/2021   ALKPHOS 52 04/21/2021   BILITOT 0.8 04/21/2021   Lab Results  Component Value Date   HGBA1C 6.2 04/21/2021   HGBA1C 6.3 10/19/2020   HGBA1C 6.4 04/20/2020   HGBA1C 6.1 (H) 10/16/2019   HGBA1C 6.1 (H) 07/01/2019   Lab Results  Component Value Date   INSULIN 21.1 07/01/2019   INSULIN 24.2 09/28/2017   INSULIN 17.6 06/21/2017   Lab Results  Component Value Date   TSH 1.83 04/21/2021   Lab Results  Component Value Date   CHOL 153 04/21/2021   HDL 49.10 04/21/2021   LDLCALC 74 04/21/2021   LDLDIRECT 122.0 04/20/2020   TRIG 148.0 04/21/2021   CHOLHDL 3 04/21/2021   Lab Results  Component Value Date  VD25OH 53.90 04/21/2021   VD25OH 38.16 04/20/2020   VD25OH 39.9 07/01/2019   Lab Results  Component Value Date   WBC 5.9 04/21/2021   HGB 12.4 04/21/2021   HCT 38.7 04/21/2021   MCV 86.2 04/21/2021   PLT 287.0 04/21/2021   No results found for: "IRON", "TIBC", "FERRITIN"  Obesity Behavioral Intervention:   Approximately 15 minutes were spent on the discussion below.  ASK: We discussed the diagnosis of obesity with Jasmin [name] today and Jasmin [name] agreed to give [name] permission to discuss obesity behavioral modification  therapy today.  ASSESS: Jasmin [name] has the diagnosis of obesity and her BMI today is 44.4. Jasmin [name] is in the action stage of change.   ADVISE: Jasmin [name] was educated on the multiple health risks of obesity as well as the benefit of weight loss to improve her health. She was advised of the need for long term treatment and the importance of lifestyle modifications to improve her current health and to decrease her risk of future health problems.  AGREE: Multiple dietary modification options and treatment options were discussed and Jasmin [name] agreed to follow the recommendations documented in the above note.  ARRANGE: Jasmin [name] was educated on the importance of frequent visits to treat obesity as outlined per CMS and USPSTF guidelines and agreed to schedule her next follow up appointment today.  Attestation Statements:   Reviewed by clinician on day of visit: allergies, medications, problem list, medical history, surgical history, family history, social history, and previous encounter notes.   I, Trixie Dredge, am acting as transcriptionist for Dennard Nip, MD.  I have reviewed the above documentation for accuracy and completeness, and I agree with the above. -  ***

## 2021-08-06 ENCOUNTER — Other Ambulatory Visit (HOSPITAL_BASED_OUTPATIENT_CLINIC_OR_DEPARTMENT_OTHER): Payer: Self-pay

## 2021-08-09 ENCOUNTER — Other Ambulatory Visit: Payer: Self-pay | Admitting: Family Medicine

## 2021-08-09 ENCOUNTER — Other Ambulatory Visit (HOSPITAL_BASED_OUTPATIENT_CLINIC_OR_DEPARTMENT_OTHER): Payer: Self-pay

## 2021-08-09 DIAGNOSIS — I10 Essential (primary) hypertension: Secondary | ICD-10-CM

## 2021-08-09 DIAGNOSIS — E119 Type 2 diabetes mellitus without complications: Secondary | ICD-10-CM

## 2021-08-09 MED ORDER — LOSARTAN POTASSIUM 50 MG PO TABS
75.0000 mg | ORAL_TABLET | Freq: Every day | ORAL | 3 refills | Status: DC
  Filled 2021-08-09: qty 135, 90d supply, fill #0
  Filled 2021-11-04: qty 135, 90d supply, fill #1
  Filled 2022-01-31: qty 135, 90d supply, fill #2
  Filled 2022-05-09: qty 135, 90d supply, fill #3

## 2021-08-09 MED ORDER — METFORMIN HCL 500 MG PO TABS
ORAL_TABLET | Freq: Two times a day (BID) | ORAL | 1 refills | Status: DC
  Filled 2021-08-09: qty 180, 90d supply, fill #0
  Filled 2021-11-17: qty 180, 90d supply, fill #1

## 2021-08-10 DIAGNOSIS — M545 Low back pain, unspecified: Secondary | ICD-10-CM | POA: Diagnosis not present

## 2021-08-10 DIAGNOSIS — M79605 Pain in left leg: Secondary | ICD-10-CM | POA: Diagnosis not present

## 2021-08-10 DIAGNOSIS — M1611 Unilateral primary osteoarthritis, right hip: Secondary | ICD-10-CM | POA: Diagnosis not present

## 2021-08-16 ENCOUNTER — Other Ambulatory Visit (HOSPITAL_BASED_OUTPATIENT_CLINIC_OR_DEPARTMENT_OTHER): Payer: Self-pay

## 2021-08-16 DIAGNOSIS — H43811 Vitreous degeneration, right eye: Secondary | ICD-10-CM | POA: Diagnosis not present

## 2021-08-17 DIAGNOSIS — M545 Low back pain, unspecified: Secondary | ICD-10-CM | POA: Diagnosis not present

## 2021-08-17 DIAGNOSIS — M1611 Unilateral primary osteoarthritis, right hip: Secondary | ICD-10-CM | POA: Diagnosis not present

## 2021-08-17 DIAGNOSIS — M79605 Pain in left leg: Secondary | ICD-10-CM | POA: Diagnosis not present

## 2021-08-19 ENCOUNTER — Other Ambulatory Visit (HOSPITAL_BASED_OUTPATIENT_CLINIC_OR_DEPARTMENT_OTHER): Payer: Self-pay

## 2021-08-23 ENCOUNTER — Other Ambulatory Visit (HOSPITAL_BASED_OUTPATIENT_CLINIC_OR_DEPARTMENT_OTHER): Payer: Self-pay

## 2021-08-26 DIAGNOSIS — M7601 Gluteal tendinitis, right hip: Secondary | ICD-10-CM | POA: Diagnosis not present

## 2021-08-26 DIAGNOSIS — M7602 Gluteal tendinitis, left hip: Secondary | ICD-10-CM | POA: Diagnosis not present

## 2021-08-26 DIAGNOSIS — M1611 Unilateral primary osteoarthritis, right hip: Secondary | ICD-10-CM | POA: Diagnosis not present

## 2021-08-26 DIAGNOSIS — M545 Low back pain, unspecified: Secondary | ICD-10-CM | POA: Diagnosis not present

## 2021-08-26 DIAGNOSIS — M79605 Pain in left leg: Secondary | ICD-10-CM | POA: Diagnosis not present

## 2021-08-27 DIAGNOSIS — G8929 Other chronic pain: Secondary | ICD-10-CM | POA: Diagnosis not present

## 2021-08-27 DIAGNOSIS — M79605 Pain in left leg: Secondary | ICD-10-CM | POA: Diagnosis not present

## 2021-08-27 DIAGNOSIS — M5442 Lumbago with sciatica, left side: Secondary | ICD-10-CM | POA: Diagnosis not present

## 2021-08-27 DIAGNOSIS — M5441 Lumbago with sciatica, right side: Secondary | ICD-10-CM | POA: Diagnosis not present

## 2021-08-30 ENCOUNTER — Other Ambulatory Visit: Payer: Self-pay | Admitting: Family Medicine

## 2021-08-30 ENCOUNTER — Other Ambulatory Visit (HOSPITAL_BASED_OUTPATIENT_CLINIC_OR_DEPARTMENT_OTHER): Payer: Self-pay

## 2021-08-30 DIAGNOSIS — Z8673 Personal history of transient ischemic attack (TIA), and cerebral infarction without residual deficits: Secondary | ICD-10-CM

## 2021-08-30 DIAGNOSIS — E782 Mixed hyperlipidemia: Secondary | ICD-10-CM

## 2021-08-30 MED ORDER — AMOXICILLIN 500 MG PO CAPS
ORAL_CAPSULE | ORAL | 5 refills | Status: DC
  Filled 2021-08-30: qty 12, 3d supply, fill #0

## 2021-08-30 MED ORDER — CLOPIDOGREL BISULFATE 75 MG PO TABS
75.0000 mg | ORAL_TABLET | Freq: Every day | ORAL | 1 refills | Status: DC
  Filled 2021-08-30: qty 90, 90d supply, fill #0
  Filled 2021-11-22: qty 90, 90d supply, fill #1

## 2021-08-30 MED ORDER — OMEGA-3-ACID ETHYL ESTERS 1 G PO CAPS
2.0000 | ORAL_CAPSULE | Freq: Two times a day (BID) | ORAL | 2 refills | Status: DC
  Filled 2021-08-30: qty 360, 90d supply, fill #0
  Filled 2021-11-22: qty 360, 90d supply, fill #1
  Filled 2022-03-04: qty 360, 90d supply, fill #2

## 2021-08-31 DIAGNOSIS — Z7409 Other reduced mobility: Secondary | ICD-10-CM | POA: Diagnosis not present

## 2021-08-31 DIAGNOSIS — M5386 Other specified dorsopathies, lumbar region: Secondary | ICD-10-CM | POA: Diagnosis not present

## 2021-08-31 DIAGNOSIS — M1611 Unilateral primary osteoarthritis, right hip: Secondary | ICD-10-CM | POA: Diagnosis not present

## 2021-08-31 DIAGNOSIS — M6281 Muscle weakness (generalized): Secondary | ICD-10-CM | POA: Diagnosis not present

## 2021-08-31 DIAGNOSIS — M79605 Pain in left leg: Secondary | ICD-10-CM | POA: Diagnosis not present

## 2021-08-31 DIAGNOSIS — M545 Low back pain, unspecified: Secondary | ICD-10-CM | POA: Diagnosis not present

## 2021-09-02 ENCOUNTER — Ambulatory Visit (INDEPENDENT_AMBULATORY_CARE_PROVIDER_SITE_OTHER): Payer: Medicare PPO | Admitting: Family Medicine

## 2021-09-13 ENCOUNTER — Ambulatory Visit (INDEPENDENT_AMBULATORY_CARE_PROVIDER_SITE_OTHER): Payer: Medicare PPO | Admitting: Family Medicine

## 2021-09-13 ENCOUNTER — Encounter (INDEPENDENT_AMBULATORY_CARE_PROVIDER_SITE_OTHER): Payer: Self-pay | Admitting: Family Medicine

## 2021-09-13 ENCOUNTER — Other Ambulatory Visit (HOSPITAL_BASED_OUTPATIENT_CLINIC_OR_DEPARTMENT_OTHER): Payer: Self-pay

## 2021-09-13 VITALS — BP 153/71 | HR 65 | Temp 98.0°F | Ht 61.0 in | Wt 239.0 lb

## 2021-09-13 DIAGNOSIS — I10 Essential (primary) hypertension: Secondary | ICD-10-CM | POA: Diagnosis not present

## 2021-09-13 DIAGNOSIS — Z6841 Body Mass Index (BMI) 40.0 and over, adult: Secondary | ICD-10-CM

## 2021-09-13 DIAGNOSIS — E669 Obesity, unspecified: Secondary | ICD-10-CM

## 2021-09-13 DIAGNOSIS — E559 Vitamin D deficiency, unspecified: Secondary | ICD-10-CM

## 2021-09-13 MED ORDER — VITAMIN D (ERGOCALCIFEROL) 1.25 MG (50000 UNIT) PO CAPS
ORAL_CAPSULE | ORAL | 0 refills | Status: DC
  Filled 2021-09-13: qty 4, 28d supply, fill #0

## 2021-09-20 DIAGNOSIS — H524 Presbyopia: Secondary | ICD-10-CM | POA: Diagnosis not present

## 2021-09-20 DIAGNOSIS — H16223 Keratoconjunctivitis sicca, not specified as Sjogren's, bilateral: Secondary | ICD-10-CM | POA: Diagnosis not present

## 2021-09-20 DIAGNOSIS — H43811 Vitreous degeneration, right eye: Secondary | ICD-10-CM | POA: Diagnosis not present

## 2021-09-20 DIAGNOSIS — H5203 Hypermetropia, bilateral: Secondary | ICD-10-CM | POA: Diagnosis not present

## 2021-09-20 DIAGNOSIS — H52203 Unspecified astigmatism, bilateral: Secondary | ICD-10-CM | POA: Diagnosis not present

## 2021-09-20 NOTE — Progress Notes (Unsigned)
Chief Complaint:   OBESITY Ajani is here to discuss her progress with her obesity treatment plan along with follow-up of her obesity related diagnoses. Cherolyn is on the Category 2 Plan and states she is following her eating plan approximately 50% of the time. Laurina states she is at the gym, and walking in the water for 30-60 minutes 5 times per week.  Today's visit was #: 87 Starting weight: 248 lbs Starting date: 06/21/2017 Today's weight: 239 lbs Today's date: 09/13/2021 Total lbs lost to date: 9 Total lbs lost since last in-office visit: 0  Interim History: Samarah is retaining some fluid, likely due to inflammation.  She has increased her strengthening exercises and her muscle mass appears to have increased, which is good.  She suspects her protein has decreased and this may be why she did not lose fat as she is being mindful of her food choices.  Subjective:   1. Essential hypertension Rayanne's blood pressure is stable on her medications, but a little higher than ideal.  She denies chest pain or headache, and she is working on increasing her exercise.  2. Vitamin D deficiency Laticha is stable on vitamin D, with no signs of over replacement.  Assessment/Plan:   1. Essential hypertension Andalyn will continue with her diet, exercise, and medications, and will continue to monitor.  We will follow-up at her next visit.  2. Vitamin D deficiency Elleanor will continue prescription vitamin D 50,000 units once every 7 days, and we will refill for 1 month.  - Vitamin D, Ergocalciferol, (DRISDOL) 1.25 MG (50000 UNIT) CAPS capsule; TAKE 1 CAPSULE (50,000 UNITS TOTAL) BY MOUTH EVERY 7 (SEVEN) DAYS.  Dispense: 4 capsule; Refill: 0  3. Obesity, Current BMI 45.2 Eulalah is currently in the action stage of change. As such, her goal is to continue with weight loss efforts. She has agreed to the Category 2 Plan.   Exercise goals: As is.   Behavioral modification strategies:  increasing water intake.  Mikella has agreed to follow-up with our clinic in 4 weeks. She was informed of the importance of frequent follow-up visits to maximize her success with intensive lifestyle modifications for her multiple health conditions.   Objective:   Blood pressure (!) 153/71, pulse 65, temperature 98 F (36.7 C), height '5\' 1"'$  (1.549 m), weight 239 lb (108.4 kg), SpO2 97 %. Body mass index is 45.16 kg/m.  General: Cooperative, alert, well developed, in no acute distress. HEENT: Conjunctivae and lids unremarkable. Cardiovascular: Regular rhythm.  Lungs: Normal work of breathing. Neurologic: No focal deficits.   Lab Results  Component Value Date   CREATININE 0.53 04/21/2021   BUN 16 04/21/2021   NA 138 04/21/2021   K 4.4 04/21/2021   CL 102 04/21/2021   CO2 30 04/21/2021   Lab Results  Component Value Date   ALT 11 04/21/2021   AST 16 04/21/2021   ALKPHOS 52 04/21/2021   BILITOT 0.8 04/21/2021   Lab Results  Component Value Date   HGBA1C 6.2 04/21/2021   HGBA1C 6.3 10/19/2020   HGBA1C 6.4 04/20/2020   HGBA1C 6.1 (H) 10/16/2019   HGBA1C 6.1 (H) 07/01/2019   Lab Results  Component Value Date   INSULIN 21.1 07/01/2019   INSULIN 24.2 09/28/2017   INSULIN 17.6 06/21/2017   Lab Results  Component Value Date   TSH 1.83 04/21/2021   Lab Results  Component Value Date   CHOL 153 04/21/2021   HDL 49.10 04/21/2021   LDLCALC 74 04/21/2021  LDLDIRECT 122.0 04/20/2020   TRIG 148.0 04/21/2021   CHOLHDL 3 04/21/2021   Lab Results  Component Value Date   VD25OH 53.90 04/21/2021   VD25OH 38.16 04/20/2020   VD25OH 39.9 07/01/2019   Lab Results  Component Value Date   WBC 5.9 04/21/2021   HGB 12.4 04/21/2021   HCT 38.7 04/21/2021   MCV 86.2 04/21/2021   PLT 287.0 04/21/2021   No results found for: "IRON", "TIBC", "FERRITIN"  Obesity Behavioral Intervention:   Approximately 15 minutes were spent on the discussion below.  ASK: We discussed the  diagnosis of obesity with Angelin today and Keilany agreed to give [name] permission to discuss obesity behavioral modification therapy today.  ASSESS: Collin has the diagnosis of obesity and her BMI today is 45.2. Tamicka is in the action stage of change.   ADVISE: Babetta was educated on the multiple health risks of obesity as well as the benefit of weight loss to improve her health. She was advised of the need for long term treatment and the importance of lifestyle modifications to improve her current health and to decrease her risk of future health problems.  AGREE: Multiple dietary modification options and treatment options were discussed and Keena agreed to follow the recommendations documented in the above note.  ARRANGE: Cyara was educated on the importance of frequent visits to treat obesity as outlined per CMS and USPSTF guidelines and agreed to schedule her next follow up appointment today.  Attestation Statements:   Reviewed by clinician on day of visit: allergies, medications, problem list, medical history, surgical history, family history, social history, and previous encounter notes.   I, Trixie Dredge, am acting as transcriptionist for Dennard Nip, MD.  I have reviewed the above documentation for accuracy and completeness, and I agree with the above. -  Dennard Nip, MD

## 2021-09-29 ENCOUNTER — Encounter (INDEPENDENT_AMBULATORY_CARE_PROVIDER_SITE_OTHER): Payer: Self-pay

## 2021-09-30 ENCOUNTER — Other Ambulatory Visit (HOSPITAL_BASED_OUTPATIENT_CLINIC_OR_DEPARTMENT_OTHER): Payer: Self-pay

## 2021-10-01 ENCOUNTER — Other Ambulatory Visit (HOSPITAL_BASED_OUTPATIENT_CLINIC_OR_DEPARTMENT_OTHER): Payer: Self-pay

## 2021-10-01 DIAGNOSIS — N390 Urinary tract infection, site not specified: Secondary | ICD-10-CM | POA: Diagnosis not present

## 2021-10-01 DIAGNOSIS — B9620 Unspecified Escherichia coli [E. coli] as the cause of diseases classified elsewhere: Secondary | ICD-10-CM | POA: Diagnosis not present

## 2021-10-01 DIAGNOSIS — R351 Nocturia: Secondary | ICD-10-CM | POA: Diagnosis not present

## 2021-10-01 DIAGNOSIS — N3281 Overactive bladder: Secondary | ICD-10-CM | POA: Diagnosis not present

## 2021-10-01 DIAGNOSIS — N3941 Urge incontinence: Secondary | ICD-10-CM | POA: Diagnosis not present

## 2021-10-01 DIAGNOSIS — N8110 Cystocele, unspecified: Secondary | ICD-10-CM | POA: Diagnosis not present

## 2021-10-04 ENCOUNTER — Other Ambulatory Visit (HOSPITAL_BASED_OUTPATIENT_CLINIC_OR_DEPARTMENT_OTHER): Payer: Self-pay

## 2021-10-04 MED ORDER — SULFAMETHOXAZOLE-TRIMETHOPRIM 800-160 MG PO TABS
ORAL_TABLET | ORAL | 0 refills | Status: DC
  Filled 2021-10-04: qty 10, 5d supply, fill #0

## 2021-10-11 ENCOUNTER — Other Ambulatory Visit (HOSPITAL_BASED_OUTPATIENT_CLINIC_OR_DEPARTMENT_OTHER): Payer: Self-pay

## 2021-10-14 ENCOUNTER — Ambulatory Visit (INDEPENDENT_AMBULATORY_CARE_PROVIDER_SITE_OTHER): Payer: Medicare PPO | Admitting: Family Medicine

## 2021-10-20 ENCOUNTER — Ambulatory Visit (INDEPENDENT_AMBULATORY_CARE_PROVIDER_SITE_OTHER): Payer: Medicare PPO | Admitting: Family Medicine

## 2021-10-23 NOTE — Patient Instructions (Incomplete)
It was great to see you again today, I will be in touch with your labs as soon as possible.  Assuming all is well, please see me in about 6 months Be sure to get your flu shot and COVID booster this fall

## 2021-10-23 NOTE — Progress Notes (Unsigned)
[facility] at [facility] [address], [place], [place] [address] [phone]  Date:  10/27/2021   Name:  Jasmin [name]   DOB:  05/09/1945   MRN:  [id number]  PCP:  Darreld Mclean, MD    Chief Complaint: No chief complaint on file.   History of Present Illness:  Jasmin [name] is a 76 y.o. very pleasant female patient who presents with the following:  Patient seen today for periodic follow-up; she is also being managed by the healthy weight and wellness clinic-she was seen in July Most recent visit with myself was in April for concern of UTI History of well-controlled diabetes, hypertension, hyperlipidemia, stroke, OSA, obesity  She has been following up with urogyn, Dr. Zigmund Daniel for her bladder concerns  Seen by pulmonology, Dr. Camillo Flaming in February for her pulmonary hypertension ([place]) #1 obstructive sleep apnea Initial diagnosis and the 1990s has been on treatment for more than 25 years, recently switched over to [name] for followups, I was able to review her previous download showing adequate resolution of underlying sleep apnea on her current CPAP of 8. She is with [facility] care.   Has been extremely well last visit with me mentioned that her machine was more than 76 years old and needed a replacement orders were sent for a new CPAP at 8 cm of water pressure, mentioned follow-up to review download is here for that today, otherwise doing well  Last 30-day download reviewed over the last 30 days usage greater than 4 hours is at 100% averaging 10 hours and 11 minutes of daily use on CPAP of 8 with residual AHI well corrected 2.1 does not need any changes  #2 Pulm Htn:   With LVH - diastolic dysfunction, following a strict diet-background of aortic stenosis overall doing well denies any orthopnea or lower extremity edema, mild chronic cough from above, does not need inhalers I noted she has desmopressin listed as her  current medications we discussed making sure she is maintaining a strict volume balance, doing well denies any resp complaints.   Recent labs on chart-CMP in March, also lipids, vitamin D which is normal, CBC, A1c 6.2%, TSH normal Lab Results  Component Value Date   HGBA1C 6.2 04/21/2021   Plavix DDAVP at bedtime Estrogen vaginal cream Losartan 50 Metformin 500 twice daily Myrbetriq 50 mg daily Lovaza 2 g twice daily Prilosec Statin 20 Vesicare Patient Active Problem List   Diagnosis Date Noted   Skin cancer 11/04/2020   BMI 45.0-49.9, adult ([place]) 07/31/2020   Swelling of both lower extremities    Shortness of breath on exertion    Red eyes    Nasal discharge    Leg cramping    Knee pain    Joint pain    Easy bruising    Dry mouth    Decreased hearing    Back pain    Urinary frequency 12/26/2018   Nocturia 12/26/2018   Recurrent UTI 12/26/2018   Vaginal atrophy 12/26/2018   Hyperopia with astigmatism and presbyopia, bilateral 02/09/2018   Long term current use of oral hypoglycemic drug 02/09/2018   PCO (posterior capsular opacification), left 02/09/2018   Pseudophakia of left eye 02/09/2018   PVD (posterior vitreous detachment), both eyes 02/09/2018   Cough 03/17/2017   Arthritis 12/16/2016   UTI (urinary tract infection)    Stroke [facility])    Sleep apnea    Right kidney stone  Hypertension    Hyperlipidemia    Heart murmur    Diabetes mellitus without complication ([place])    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   Cystocele with rectocele 08/21/2016   Mixed hyperlipidemia 08/08/2016   Controlled type 2 diabetes mellitus without complication, without long-term current use of insulin ([place]) 08/08/2016   Essential hypertension 08/08/2016   History of CVA (cerebrovascular accident) 08/08/2016   Nuclear sclerotic cataract of left eye 07/07/2016   OAB (overactive bladder) 01/20/2016   Morbid obesity ([place]) 11/10/2015   Contusion of right knee 09/09/2015    Quadriceps weakness 09/09/2015   Right shoulder pain 08/21/2015   IBS (irritable bowel syndrome) 07/16/2015   Insomnia 07/16/2015   Left ventricular hypertrophy 07/16/2015   Pulmonary hypertension ([place]) 07/16/2015   Primary osteoarthritis of left knee 03/31/2015   Acid reflux disease 03/09/2015   Aortic stenosis 03/09/2015   AR (allergic rhinitis) 03/09/2015   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   OSA (obstructive sleep apnea) 03/09/2015   Osteoporosis 03/09/2015   Vitamin D deficiency 03/09/2015   Left tibialis posterior tendinitis 12/13/2013   Aftercare following right knee joint replacement surgery 12/04/2013   Sprain of ankle, left 11/15/2013   Overweight 08/16/2013   Broken bones 08/13/2013    Past Medical History:  Diagnosis Date   Acid reflux disease 03/09/2015   Aortic stenosis 03/09/2015   AR (allergic rhinitis) 03/09/2015   Arthritis    Back pain    Controlled type 2 diabetes mellitus without complication, without long-term current use of insulin ([place]) 08/08/2016   Cough    Cystocele with rectocele 08/21/2016   Decreased hearing    Diabetes mellitus without complication ([facility])    Dry mouth    Easy bruising    Essential hypertension 08/08/2016   Heart murmur    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   History of CVA (cerebrovascular accident) 08/08/2016   Seen on MRI from 08/2015   Hyperlipidemia    Hypertension    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   IBS (irritable bowel syndrome) 07/16/2015   Insomnia 07/16/2015   Joint pain    Knee pain    Left ventricular hypertrophy 07/16/2015   Leg cramping    right leg   Mixed hyperlipidemia 08/08/2016   Morbid obesity ([place]) 11/10/2015   Nasal discharge    Nuclear sclerotic cataract of left eye 07/07/2016   OAB (overactive bladder) 01/20/2016   OSA (obstructive sleep apnea) 03/09/2015   Osteoporosis 03/09/2015   Primary osteoarthritis of left knee 03/31/2015   Pulmonary hypertension ([place]) 07/16/2015   Red eyes    Right  kidney stone    Shortness of breath on exertion    Skin cancer 11/04/2020   Sleep apnea    Stroke ([place])    Swelling of both lower extremities    UTI (urinary tract infection)    Vitamin D deficiency 03/09/2015    Past Surgical History:  Procedure Laterality Date   ABDOMINAL HYSTERECTOMY  09/14/2016   CATARACT EXTRACTION Left    CATARACT EXTRACTION Left 06/2016   COLPORRHAPHY N/A 09/14/2016   [facility]   CYSTOCELE REPAIR     FRACTURE SURGERY Left 1953   L arm   FRACTURE SURGERY Left 1985   L ankle   HERNIA REPAIR  2004   JOINT REPLACEMENT Right 2012   R TKA   JOINT REPLACEMENT Left 2017   KIDNEY STONE SURGERY     LAPAROSCOPIC ASSISTED VAGINAL HYSTERECTOMY N/A 09/14/2016   [facility]   R foot/ankle Right 2013  and 2014   plate and screws lateral foot and tendon removal medial foot in 2013, revision in 2014   [facility]     URETEROSCOPY      Social History   Tobacco Use   Smoking status: Never    Passive exposure: Never   Smokeless tobacco: Never  Vaping Use   Vaping Use: Never used  Substance Use Topics   Alcohol use: Yes    Alcohol/week: 2.0 - 3.0 standard drinks of alcohol    Types: 2 - 3 Glasses of wine per week    Comment: couple of glasses per week   Drug use: No    Family History  Problem Relation Age of Onset   Hyperlipidemia Mother    Heart disease Mother    Hypertension Mother    Stroke Mother    Thyroid disease Mother    Hypertension Father    Heart disease Father     Allergies  Allergen Reactions   Latex Itching   Ciprofloxacin Hives   Metronidazole Hives   Adhesive [Tape] Rash    Medication list has been reviewed and updated.  Current Outpatient Medications on File Prior to Visit  Medication Sig Dispense Refill   Accu-Chek Softclix Lancets lancets USE AS DIRECTED TO CHECK BLOOD GLUCOSE ONCE DAILY 100 each 3   Adhesive Tape (RA ADHESIVE 1"X10YD) TAPE 2 inch Paper tape roll 2 each 0   amoxicillin (AMOXIL) 500 MG  capsule Take prior to dentist procedure     amoxicillin (AMOXIL) 500 MG capsule TAKE 4 CAPSULES BY MOUTH 1 HOUR PRIOR TO DENTAL APPOINTMENT 12 capsule 5   blood glucose meter kit and supplies KIT Dispense based on patient and insurance preference. Use once daily to monitor glucose as needed (FOR ICD-9 250.00, 250.01). 1 each 0   CALCIUM PO Take 1 tablet by mouth daily.     Cholecalciferol (VITAMIN D3) 2000 units TABS Take by mouth. Take one tablet in the AM     clopidogrel (PLAVIX) 75 MG tablet Take 1 tablet (75 mg total) by mouth daily. 90 tablet 1   clotrimazole-betamethasone (LOTRISONE) cream Apply to plantar foot daily 45 g 11   COVID-19 mRNA bivalent vaccine, Pfizer, (PFIZER COVID-19 VAC BIVALENT) injection Inject into the muscle. 0.3 mL 0   desmopressin (DDAVP) 0.2 MG tablet Take 1 tablet by mouth nightly 90 tablet 3   desonide (DESOWEN) 0.05 % cream Apply a thin layer to the affected area twice daily for 2 weeks then stop for 1 week. Repeat for flares. 60 g 0   econazole nitrate 1 % cream Apply topically as needed.      estradiol (ESTRACE) 0.1 MG/GM vaginal cream Apply 0.5 grams vaginally nightly for 2 weeks, then apply 0.5 grams 2 times weekly 42.5 g 3   fluticasone (FLONASE) 50 MCG/ACT nasal spray Place 2 sprays into both nostrils daily. 48 g 3   Gauze Pads & Dressings (CURITY ABDOMINAL) 5"X9" PADS ABD pad- Apply twice a day to cover incision and secure with paper tape.  DO this once your Aquacel is removed. 20 each 0   glucose blood (ACCU-CHEK GUIDE) test strip USE AS DIRECTED TO CHECK BLOOD GLUCOSE ONCE DAILY 100 strip 3   ketorolac (ACULAR) 0.5 % ophthalmic solution Place 1 drop into the right eye 4 times daily. Start the day after surgery 5 mL 1   losartan (COZAAR) 50 MG tablet Take 1 & 1/2 tablets (75 mg total) by mouth  daily. 135 tablet 3   metFORMIN (GLUCOPHAGE) 500 MG tablet TAKE 1 TABLET (500 MG TOTAL) BY MOUTH 2 (TWO) TIMES DAILY WITH A MEAL. 180 tablet 1   mirabegron ER  (MYRBETRIQ) 50 MG TB24 tablet Take 1 tablet (50 mg total) by mouth daily. 90 tablet 0   omega-3 acid ethyl esters (LOVAZA) 1 g capsule TAKE 2 CAPSULES BY MOUTH TWICE DAILY 360 capsule 2   omeprazole (PRILOSEC) 40 MG capsule Take 1 capsule (40 mg total) by mouth daily. 30 capsule 11   Polyethyl Glycol-Propyl Glycol (SYSTANE OP) Apply to eye.     Probiotic Product (PROBIOTIC-10 PO) Take 1 capsule by mouth daily.     PSYLLIUM PO Take by mouth.     simvastatin (ZOCOR) 20 MG tablet TAKE 1 TABLET (20 MG TOTAL) BY MOUTH AT BEDTIME. 90 tablet 3   solifenacin (VESICARE) 10 MG tablet Take 1 tablet (10 mg total) by mouth daily. 30 tablet 1   solifenacin (VESICARE) 10 MG tablet Take 1 tablet (10 mg total) by mouth daily. 90 tablet 3   sulfamethoxazole-trimethoprim (BACTRIM DS) 800-160 MG tablet Take 1 tablet by mouth 2 times daily for 5 days. 10 tablet 0   Vitamin D, Ergocalciferol, (DRISDOL) 1.25 MG (50000 UNIT) CAPS capsule TAKE 1 CAPSULE (50,000 UNITS TOTAL) BY MOUTH EVERY 7 (SEVEN) DAYS. 4 capsule 0   No current facility-administered medications on file prior to visit.    Review of Systems:  As per HPI- otherwise negative.  Physical Examination: There were no vitals filed for this visit. There were no vitals filed for this visit. There is no height or weight on file to calculate BMI. Ideal Body Weight:    GEN: no acute distress. HEENT: Atraumatic, Normocephalic.  Ears and Nose: No external deformity. CV: RRR, No M/G/R. No JVD. No thrill. No extra heart sounds. PULM: CTA B, no wheezes, crackles, rhonchi. No retractions. No resp. distress. No accessory muscle use. ABD: S, NT, ND, +BS. No rebound. No HSM. EXTR: No c/c/e PSYCH: Normally interactive. Conversant.    Assessment and Plan: ***  Signed Lamar Blinks, MD

## 2021-10-27 ENCOUNTER — Ambulatory Visit: Payer: Medicare PPO | Admitting: Family Medicine

## 2021-10-27 ENCOUNTER — Encounter: Payer: Self-pay | Admitting: Family Medicine

## 2021-10-27 ENCOUNTER — Other Ambulatory Visit (HOSPITAL_BASED_OUTPATIENT_CLINIC_OR_DEPARTMENT_OTHER): Payer: Self-pay

## 2021-10-27 VITALS — BP 130/76 | HR 78 | Temp 97.8°F | Resp 18 | Ht 61.0 in | Wt 245.4 lb

## 2021-10-27 DIAGNOSIS — Z6841 Body Mass Index (BMI) 40.0 and over, adult: Secondary | ICD-10-CM

## 2021-10-27 DIAGNOSIS — N3281 Overactive bladder: Secondary | ICD-10-CM | POA: Diagnosis not present

## 2021-10-27 DIAGNOSIS — I10 Essential (primary) hypertension: Secondary | ICD-10-CM | POA: Diagnosis not present

## 2021-10-27 DIAGNOSIS — E782 Mixed hyperlipidemia: Secondary | ICD-10-CM

## 2021-10-27 DIAGNOSIS — E119 Type 2 diabetes mellitus without complications: Secondary | ICD-10-CM

## 2021-10-27 LAB — BASIC METABOLIC PANEL
BUN: 16 mg/dL (ref 6–23)
CO2: 28 mEq/L (ref 19–32)
Calcium: 9.6 mg/dL (ref 8.4–10.5)
Chloride: 99 mEq/L (ref 96–112)
Creatinine, Ser: 0.57 mg/dL (ref 0.40–1.20)
GFR: 88.13 mL/min (ref >60.00)
Glucose, Bld: 109 mg/dL — ABNORMAL HIGH (ref 70–99)
Potassium: 4.6 mEq/L (ref 3.5–5.1)
Sodium: 136 mEq/L (ref 135–145)

## 2021-10-27 LAB — HEMOGLOBIN A1C: Hgb A1c MFr Bld: 6.9 % — ABNORMAL HIGH (ref 4.6–6.5)

## 2021-10-27 MED ORDER — TIRZEPATIDE 2.5 MG/0.5ML ~~LOC~~ SOAJ
2.5000 mg | SUBCUTANEOUS | 1 refills | Status: DC
  Filled 2021-10-27 – 2021-11-17 (×2): qty 2, 28d supply, fill #0

## 2021-10-29 DIAGNOSIS — M47816 Spondylosis without myelopathy or radiculopathy, lumbar region: Secondary | ICD-10-CM | POA: Diagnosis not present

## 2021-11-04 ENCOUNTER — Other Ambulatory Visit (HOSPITAL_BASED_OUTPATIENT_CLINIC_OR_DEPARTMENT_OTHER): Payer: Self-pay

## 2021-11-04 ENCOUNTER — Ambulatory Visit (INDEPENDENT_AMBULATORY_CARE_PROVIDER_SITE_OTHER): Payer: Medicare PPO | Admitting: Family Medicine

## 2021-11-17 ENCOUNTER — Other Ambulatory Visit (HOSPITAL_BASED_OUTPATIENT_CLINIC_OR_DEPARTMENT_OTHER): Payer: Self-pay

## 2021-11-18 ENCOUNTER — Other Ambulatory Visit (HOSPITAL_BASED_OUTPATIENT_CLINIC_OR_DEPARTMENT_OTHER): Payer: Self-pay

## 2021-11-18 ENCOUNTER — Ambulatory Visit (INDEPENDENT_AMBULATORY_CARE_PROVIDER_SITE_OTHER): Payer: Medicare PPO | Admitting: Family Medicine

## 2021-11-18 ENCOUNTER — Encounter (INDEPENDENT_AMBULATORY_CARE_PROVIDER_SITE_OTHER): Payer: Self-pay | Admitting: Family Medicine

## 2021-11-18 VITALS — BP 165/74 | HR 77 | Temp 97.5°F | Ht 61.0 in | Wt 236.0 lb

## 2021-11-18 DIAGNOSIS — E669 Obesity, unspecified: Secondary | ICD-10-CM | POA: Diagnosis not present

## 2021-11-18 DIAGNOSIS — I10 Essential (primary) hypertension: Secondary | ICD-10-CM | POA: Diagnosis not present

## 2021-11-18 DIAGNOSIS — E119 Type 2 diabetes mellitus without complications: Secondary | ICD-10-CM | POA: Insufficient documentation

## 2021-11-18 DIAGNOSIS — E559 Vitamin D deficiency, unspecified: Secondary | ICD-10-CM | POA: Diagnosis not present

## 2021-11-18 DIAGNOSIS — E1169 Type 2 diabetes mellitus with other specified complication: Secondary | ICD-10-CM | POA: Diagnosis not present

## 2021-11-18 DIAGNOSIS — Z6841 Body Mass Index (BMI) 40.0 and over, adult: Secondary | ICD-10-CM

## 2021-11-18 DIAGNOSIS — Z7985 Long-term (current) use of injectable non-insulin antidiabetic drugs: Secondary | ICD-10-CM | POA: Diagnosis not present

## 2021-11-18 MED ORDER — VITAMIN D (ERGOCALCIFEROL) 1.25 MG (50000 UNIT) PO CAPS
50000.0000 [IU] | ORAL_CAPSULE | ORAL | 0 refills | Status: DC
  Filled 2021-11-18: qty 4, 28d supply, fill #0

## 2021-11-19 ENCOUNTER — Other Ambulatory Visit (HOSPITAL_BASED_OUTPATIENT_CLINIC_OR_DEPARTMENT_OTHER): Payer: Self-pay

## 2021-11-19 LAB — VITAMIN D 25 HYDROXY (VIT D DEFICIENCY, FRACTURES): Vit D, 25-Hydroxy: 71.2 ng/mL (ref 30.0–100.0)

## 2021-11-22 ENCOUNTER — Other Ambulatory Visit (HOSPITAL_BASED_OUTPATIENT_CLINIC_OR_DEPARTMENT_OTHER): Payer: Self-pay

## 2021-11-22 MED ORDER — MYRBETRIQ 50 MG PO TB24
50.0000 mg | ORAL_TABLET | Freq: Every day | ORAL | 0 refills | Status: DC
  Filled 2021-11-22: qty 90, 90d supply, fill #0

## 2021-11-22 NOTE — Progress Notes (Addendum)
Chief Complaint:   OBESITY Jasmin [name] is here to discuss her progress with her obesity treatment plan along with follow-up of her obesity related diagnoses. Jasmin [name] is on the Category 2 Plan and states she is following her eating plan approximately 75% of the time. Jasmin [name] states she is walking, swimming 4 times, and doing cardio 2 times for 30 minutes 6 times per week.  Today's visit was #: 61 Starting weight: 248 lbs Starting date: 06/21/2017 Today's weight: 236 lbs Today's date: 11/18/2021 Total lbs lost to date: 12 Total lbs lost since last in-office visit: 3  Interim History: Jasmin [name] has done well with weight loss. She has increased exercise. She is doing walking in the pool. She started Mounjaro 2.5 mg weekly, and decreased metformin to 500 mg once daily per her PCP last month as her A1c had increased.   Subjective:   1. Essential hypertension Jasmin [name] is taking losartan 75 mg daily. Her blood pressure is slightly elevated today, higher than ideal. She is working on her diet and increasing exercise.   2. Vitamin D deficiency Jasmin [name] takes Vitamin D 5,000 IU weekly without side effects.   3. Type 2 diabetes mellitus with other specified complication, without long-term current use of insulin (HCC) Jasmin [name]'s PCP started Mounjaro 2.5 mg weekly and decrease metformin to 500 mg daily. Last A1c was 6.9. we discussed need to maintain her diet and to maintain good nutrition. Her blood glucose ranges between 110-130's, with no side effects noted.   Assessment/Plan:   1. Essential hypertension Jasmin [name] will continue with her diet and exercise, and medications. We will continue to monitor and follow-up at her next visit.   2. Vitamin D deficiency We will check labs today, and we will refill prescription Vitamin D for 1 month. Jasmin [name] will follow-up for routine testing of Vitamin D, at least 2-3 times per year to avoid over-replacement.  - VITAMIN D 25 Hydroxy (Vit-D Deficiency,  Fractures) - Vitamin D, Ergocalciferol, (DRISDOL) 1.25 MG (50000 UNIT) CAPS capsule; Take 1 capsule (50,000 Units total) by mouth every 7 (seven) days.  Dispense: 4 capsule; Refill: 0  3. Type 2 diabetes mellitus with other specified complication, without long-term current use of insulin (HCC) Jasmin [name] will continue with her diet, exercise, Mounjaro, and metformin.    4. Obesity, Current BMI 44.6 Jasmin [name] is currently in the action stage of change. As such, her goal is to continue with weight loss efforts. She has agreed to the Category 2 Plan.   Jasmin [name] will continue with her diet, exercise, and (Mounjaro and metformin).   Exercise goals: As is.   Behavioral modification strategies: increasing lean protein intake, decreasing simple carbohydrates, and meal planning and cooking strategies.  Jasmin [name] has agreed to follow-up with our clinic in 4 weeks. She was informed of the importance of frequent follow-up visits to maximize her success with intensive lifestyle modifications for her multiple health conditions.   Jasmin [name] was informed we would discuss her lab results at her next visit unless there is a critical issue that needs to be addressed sooner. Jasmin [name] agreed to keep her next visit at the agreed upon time to discuss these results.  Objective:   Blood pressure (!) 165/74, pulse 77, temperature (!) 97.5 F (36.4 C), height '5\' 1"'$  (1.549 m), weight 236 lb (107 kg), SpO2 99 %. Body mass index is 44.59 kg/m.  General: Cooperative, alert, well developed, in no acute distress. HEENT: Conjunctivae and lids unremarkable. Cardiovascular: Regular rhythm.  Lungs: Normal work of breathing.  Neurologic: No focal deficits.   Lab Results  Component Value Date   CREATININE 0.57 10/27/2021   BUN 16 10/27/2021   NA 136 10/27/2021   K 4.6 10/27/2021   CL 99 10/27/2021   CO2 28 10/27/2021   Lab Results  Component Value Date   ALT 11 04/21/2021   AST 16 04/21/2021   ALKPHOS 52 04/21/2021    BILITOT 0.8 04/21/2021   Lab Results  Component Value Date   HGBA1C 6.9 (H) 10/27/2021   HGBA1C 6.2 04/21/2021   HGBA1C 6.3 10/19/2020   HGBA1C 6.4 04/20/2020   HGBA1C 6.1 (H) 10/16/2019   Lab Results  Component Value Date   INSULIN 21.1 07/01/2019   INSULIN 24.2 09/28/2017   INSULIN 17.6 06/21/2017   Lab Results  Component Value Date   TSH 1.83 04/21/2021   Lab Results  Component Value Date   CHOL 153 04/21/2021   HDL 49.10 04/21/2021   LDLCALC 74 04/21/2021   LDLDIRECT 122.0 04/20/2020   TRIG 148.0 04/21/2021   CHOLHDL 3 04/21/2021   Lab Results  Component Value Date   VD25OH 71.2 11/18/2021   VD25OH 53.90 04/21/2021   VD25OH 38.16 04/20/2020   Lab Results  Component Value Date   WBC 5.9 04/21/2021   HGB 12.4 04/21/2021   HCT 38.7 04/21/2021   MCV 86.2 04/21/2021   PLT 287.0 04/21/2021   No results found for: "IRON", "TIBC", "FERRITIN"  Attestation Statements:   Reviewed by clinician on day of visit: allergies, medications, problem list, medical history, surgical history, family history, social history, and previous encounter notes.   I, Trixie Dredge, am acting as transcriptionist for Dennard Nip, MD.  I have reviewed the above documentation for accuracy and completeness, and I agree with the above. -  Dennard Nip, MD

## 2021-11-23 ENCOUNTER — Encounter: Payer: Self-pay | Admitting: Family Medicine

## 2021-11-24 ENCOUNTER — Other Ambulatory Visit (HOSPITAL_BASED_OUTPATIENT_CLINIC_OR_DEPARTMENT_OTHER): Payer: Self-pay

## 2021-12-01 ENCOUNTER — Telehealth: Payer: Self-pay | Admitting: Family Medicine

## 2021-12-01 ENCOUNTER — Encounter: Payer: Self-pay | Admitting: Family Medicine

## 2021-12-01 NOTE — Telephone Encounter (Signed)
Patient states she's been having diarrhea, has been bloated, and burping a lot. She is not sure if it's due to the mounjaro injection. Please advise.

## 2021-12-01 NOTE — Telephone Encounter (Signed)
Could this be a s/e?

## 2021-12-07 ENCOUNTER — Telehealth: Payer: Self-pay | Admitting: Family Medicine

## 2021-12-07 NOTE — Telephone Encounter (Signed)
Left message for patient to call back and schedule Medicare Annual Wellness Visit (AWV).   Please offer to do virtually or by telephone.  Left office number and my jabber [phone].  Last AWV:10/19/2020  Please schedule at anytime with Nurse Health Advisor.

## 2021-12-08 ENCOUNTER — Other Ambulatory Visit (HOSPITAL_BASED_OUTPATIENT_CLINIC_OR_DEPARTMENT_OTHER): Payer: Self-pay

## 2021-12-08 ENCOUNTER — Encounter: Payer: Self-pay | Admitting: Family Medicine

## 2021-12-08 MED ORDER — OMEPRAZOLE 40 MG PO CPDR
40.0000 mg | DELAYED_RELEASE_CAPSULE | Freq: Every day | ORAL | 11 refills | Status: DC
  Filled 2021-12-08: qty 30, 30d supply, fill #0
  Filled 2022-01-06: qty 30, 30d supply, fill #1
  Filled 2022-02-07: qty 30, 30d supply, fill #2
  Filled 2022-03-04: qty 30, 30d supply, fill #3
  Filled 2022-04-03: qty 30, 30d supply, fill #4
  Filled 2022-05-09: qty 30, 30d supply, fill #5
  Filled 2022-06-02: qty 30, 30d supply, fill #6
  Filled 2022-07-01: qty 30, 30d supply, fill #7
  Filled 2022-08-04: qty 30, 30d supply, fill #8
  Filled 2022-08-29: qty 30, 30d supply, fill #9
  Filled ????-??-??: fill #8

## 2021-12-09 ENCOUNTER — Other Ambulatory Visit (HOSPITAL_BASED_OUTPATIENT_CLINIC_OR_DEPARTMENT_OTHER): Payer: Self-pay

## 2021-12-09 MED ORDER — AREXVY 120 MCG/0.5ML IM SUSR
INTRAMUSCULAR | 0 refills | Status: DC
  Filled 2021-12-09: qty 1, 1d supply, fill #0

## 2021-12-20 ENCOUNTER — Other Ambulatory Visit (HOSPITAL_BASED_OUTPATIENT_CLINIC_OR_DEPARTMENT_OTHER): Payer: Self-pay

## 2021-12-23 ENCOUNTER — Ambulatory Visit (INDEPENDENT_AMBULATORY_CARE_PROVIDER_SITE_OTHER): Payer: Medicare PPO | Admitting: Family Medicine

## 2021-12-27 ENCOUNTER — Other Ambulatory Visit (HOSPITAL_BASED_OUTPATIENT_CLINIC_OR_DEPARTMENT_OTHER): Payer: Self-pay

## 2021-12-29 ENCOUNTER — Other Ambulatory Visit (HOSPITAL_BASED_OUTPATIENT_CLINIC_OR_DEPARTMENT_OTHER): Payer: Self-pay

## 2021-12-29 ENCOUNTER — Ambulatory Visit (INDEPENDENT_AMBULATORY_CARE_PROVIDER_SITE_OTHER): Payer: Medicare PPO | Admitting: *Deleted

## 2021-12-29 VITALS — BP 154/77 | HR 85 | Ht 61.0 in | Wt 244.8 lb

## 2021-12-29 DIAGNOSIS — Z0000 Encounter for general adult medical examination without abnormal findings: Secondary | ICD-10-CM

## 2021-12-29 NOTE — Progress Notes (Signed)
Subjective:   Jasmin [name] is a 76 y.o. female who presents for Medicare Annual (Subsequent) preventive examination.  Review of Systems    Defer to PCP Cardiac Risk Factors include: advanced age (>70mn, >>61women);diabetes mellitus;hypertension;dyslipidemia;obesity (BMI >30kg/m2)     Objective:    Today's Vitals   12/29/21 0817  BP: (!) 154/77  Pulse: 85  Weight: 244 lb 12.8 oz (111 kg)  Height: _0  (1.549 m)   Body mass index is 46.25 kg/m.     12/29/2021    8:27 AM 10/19/2020    9:35 AM 08/14/2018    8:17 AM 05/25/2017    9:22 AM 10/02/2016    9:40 AM 09/24/2015    7:58 AM 06/03/2014    9:22 AM  Advanced Directives  Does Patient Have a Medical Advance Directive? Yes Yes Yes Yes No Yes Yes  Type of AParamedicof [place]iday [place]Living will [place]Living will H[place]Living will HCentreLiving will  Living will;Healthcare Power of ABandon Does patient want to make changes to medical advance directive? No - Patient declined  No - Patient declined    No - Patient declined  Copy of HHopein Chart? No - copy requested No - copy requested No - copy requested No - copy requested   No - copy requested  Would patient like information on creating a medical advance directive?     Yes (ED - Information included in AVS)      Current Medications (verified) Outpatient Encounter Medications as of 12/29/2021  Medication Sig   Accu-Chek Softclix Lancets lancets USE AS DIRECTED TO CHECK BLOOD GLUCOSE ONCE DAILY   Adhesive Tape (RA ADHESIVE 1"X10YD) TAPE 2 inch Paper tape roll   amoxicillin (AMOXIL) 500 MG capsule Take prior to dentist procedure   Ascorbic Acid (VITAMIN C WITH ROSE HIPS) 500 MG tablet Take 500 mg by mouth daily.   blood glucose meter kit and supplies KIT Dispense based on patient and insurance preference. Use once daily to monitor glucose as  needed (FOR ICD-9 250.00, 250.01).   CALCIUM PO Take 1 tablet by mouth daily.   Cholecalciferol (VITAMIN D3) 2000 units TABS Take by mouth. Take one tablet in the AM   clopidogrel (PLAVIX) 75 MG tablet Take 1 tablet (75 mg total) by mouth daily.   clotrimazole-betamethasone (LOTRISONE) cream Apply to plantar foot daily   desmopressin (DDAVP) 0.2 MG tablet Take 1 tablet by mouth nightly   desonide (DESOWEN) 0.05 % cream Apply a thin layer to the affected area twice daily for 2 weeks then stop for 1 week. Repeat for flares.   econazole nitrate 1 % cream Apply topically as needed.    estradiol (ESTRACE) 0.1 MG/GM vaginal cream Apply 0.5 grams vaginally nightly for 2 weeks, then apply 0.5 grams 2 times weekly   fluticasone (FLONASE) 50 MCG/ACT nasal spray Place 2 sprays into both nostrils daily.   Gauze Pads & Dressings (CURITY ABDOMINAL) 5"X9" PADS ABD pad- Apply twice a day to cover incision and secure with paper tape.  DO this once your Aquacel is removed.   glucose blood (ACCU-CHEK GUIDE) test strip USE AS DIRECTED TO CHECK BLOOD GLUCOSE ONCE DAILY   losartan (COZAAR) 50 MG tablet Take 1 & 1/2 tablets (75 mg total) by mouth daily.   metFORMIN (GLUCOPHAGE) 500 MG tablet TAKE 1 TABLET (500 MG TOTAL) BY MOUTH 2 (TWO) TIMES DAILY WITH A MEAL.  mirabegron ER (MYRBETRIQ) 50 MG TB24 tablet Take 1 tablet (50 mg total) by mouth daily.   omega-3 acid ethyl esters (LOVAZA) 1 g capsule TAKE 2 CAPSULES BY MOUTH TWICE DAILY   omeprazole (PRILOSEC) 40 MG capsule Take 1 capsule (40 mg total) by mouth daily.   OXYCODONE HCL PO Take by mouth as needed.   Polyethyl Glycol-Propyl Glycol (SYSTANE OP) Apply to eye.   PSYLLIUM PO Take by mouth.   RSV vaccine recomb adjuvanted (AREXVY) 120 MCG/0.5ML injection Inject into the muscle.   simvastatin (ZOCOR) 20 MG tablet TAKE 1 TABLET (20 MG TOTAL) BY MOUTH AT BEDTIME.   solifenacin (VESICARE) 10 MG tablet Take 1 tablet (10 mg total) by mouth daily.   Vitamin D,  Ergocalciferol, (DRISDOL) 1.25 MG (50000 UNIT) CAPS capsule Take 1 capsule (50,000 Units total) by mouth every 7 (seven) days.   [DISCONTINUED] ketorolac (ACULAR) 0.5 % ophthalmic solution Place 1 drop into the right eye 4 times daily. Start the day after surgery   [DISCONTINUED] Probiotic Product (PROBIOTIC-10 PO) Take 1 capsule by mouth daily.   [DISCONTINUED] tirzepatide [facility]) 2.5 MG/0.5ML Pen Inject 2.5 mg into the skin once a week.   No facility-administered encounter medications on file as of 12/29/2021.    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   History of CVA (cerebrovascular accident) 08/08/2016   Seen on MRI from 08/2015   Hyperlipidemia    Hypertension    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   IBS (irritable bowel syndrome) 07/16/2015   Insomnia 07/16/2015   Joint pain    Knee pain    Left ventricular hypertrophy 07/16/2015   Leg cramping    right leg   Mixed hyperlipidemia 08/08/2016   Morbid obesity ([place]) 11/10/2015   Nasal discharge    Nuclear sclerotic cataract of left eye 07/07/2016   OAB (overactive bladder) 01/20/2016   OSA (obstructive sleep apnea) 03/09/2015   Osteoporosis 03/09/2015   Primary osteoarthritis of left knee 03/31/2015   Pulmonary hypertension ([place]) 07/16/2015   Red eyes    Right kidney stone    Shortness of breath on exertion    Skin cancer 11/04/2020   Sleep apnea    Stroke  ([place])    Swelling of both lower extremities    UTI (urinary tract infection)    Vitamin D deficiency 03/09/2015   Past Surgical History:  Procedure Laterality Date   ABDOMINAL HYSTERECTOMY  09/14/2016   CATARACT EXTRACTION Left    CATARACT EXTRACTION Left 06/2016   COLPORRHAPHY N/A 09/14/2016   [facility]   CYSTOCELE REPAIR     FRACTURE SURGERY Left 1953   L arm   FRACTURE SURGERY Left 1985   L ankle   HERNIA REPAIR  2004   JOINT REPLACEMENT Right 2012   R TKA   JOINT REPLACEMENT Left 2017   KIDNEY STONE SURGERY     LAPAROSCOPIC ASSISTED VAGINAL HYSTERECTOMY  N/A 09/14/2016   [facility]   R foot/ankle Right 2013 and 2014   plate and screws lateral foot and tendon removal medial foot in 2013, revision in 2014   [facility]     URETEROSCOPY     Family History  Problem Relation Age of Onset   Hyperlipidemia Mother    Heart disease Mother    Hypertension Mother    Stroke Mother    Thyroid disease Mother    Hypertension Father    Heart disease Father    Social History   Socioeconomic History   Marital status: Married    Spouse name: Thera Basden   Number of children: 1   Years of education: Not on file   Highest education level: Not on file  Occupational History   Occupation: Retired Pharmacist, hospital  Tobacco Use   Smoking status: Never    Passive exposure: Never   Smokeless tobacco: Never  Vaping Use   Vaping Use: Never used  Substance and Sexual Activity   Alcohol use: Yes    Alcohol/week: 2.0 - 3.0 standard drinks of alcohol    Types: 2 - 3 Glasses of wine per week    Comment: couple of glasses per week   Drug use: No   Sexual activity: Not Currently  Other Topics Concern   Not on file  Social History Narrative   Not on file   Social Determinants of Health   Financial Resource Strain: Low Risk  (10/19/2020)   Overall Financial Resource Strain (CARDIA)    Difficulty of Paying Living Expenses: Not hard at all  Food Insecurity: No Food  Insecurity (12/29/2021)   Hunger Vital Sign    Worried About Running Out of Food in the Last Year: Never true    Ran Out of Food in the Last Year: Never true  Transportation Needs: No Transportation Needs (12/29/2021)   PRAPARE - Hydrologist (Medical): No    Lack of Transportation (Non-Medical): No  Physical Activity: Sufficiently Active (10/19/2020)   Exercise Vital Sign    Days of Exercise per Week: 5 days    Minutes of Exercise per Session: 30 min  Stress: No Stress Concern Present (10/19/2020)   [place]    Feeling of Stress : Not at all  Social Connections: Socially Integrated (10/19/2020)   Social Connection and Isolation Panel [NHANES]    Frequency of Communication with Friends and Family: More than three times a week    Frequency of Social Gatherings with Friends and Family: More than three times a week    Attends Religious Services: More than 4 times per year    Active Member of [employer] or Organizations: Yes    Attends Music therapist: More than 4 times per year    Marital Status: Married    Tobacco Counseling Counseling given: Not Answered   Clinical Intake:  Pre-visit preparation completed: Yes  Pain : 0-10 Pain Location: Hip Pain Orientation: Other (Comment) (bilateral hips) Pain Descriptors / Indicators: Aching Pain Onset: More than a month ago Pain Frequency: Intermittent  How often do you need to have someone help you when you read instructions, pamphlets, or other written materials from your doctor or pharmacy?: 1 - Never  Diabetic? Nutrition Risk Assessment:  Has the patient had any N/V/D within the last 2 months?  No  Does the patient have any non-healing wounds?  No  Has the patient had any unintentional weight loss or weight gain?  No   Diabetes:  Is the patient diabetic?  Yes  If diabetic, was a CBG obtained today?  No  Did the patient bring  in their glucometer from home?  No  How often do you monitor your CBG's? Every morning.   Financial Strains and Diabetes Management:  Are you having any financial strains with the device, your supplies or your medication? No .  Does the patient want to be seen by Chronic Care Management for management of their diabetes?  No  Would the patient like to be referred to a Nutritionist or for Diabetic Management?  No   Diabetic Exams:  Diabetic Eye Exam: Completed 04/05/21 Diabetic Foot Exam: Completed 04/21/21    Interpreter Needed?: No  Information entered by :: Beatris Ship, [place]   Activities of Daily Living    12/29/2021    8:27 AM  In your present state of health, do you have any difficulty performing the following activities:  Hearing? 1  Comment wears hearing aids  Vision? 0  Difficulty concentrating or making decisions? 0  Walking or climbing stairs? 1  Dressing or bathing? 0  Doing errands, shopping? 0  Preparing Food and eating ? N  Using the Toilet? N  In the past six months, have you accidently leaked urine? N  Do you have problems with loss of bowel control? N  Managing your Medications? N  Managing your Finances? N  Housekeeping or managing your Housekeeping? N    Patient Care Team: Copland, Gay Filler, MD as PCP - General (Family Medicine) Charlaine Dalton, MD as Consulting Physician (Pulmonary Disease) Donette Larry, MD as Consulting Physician (Orthopedic Surgery) Hollar, Katharine Look, MD as Consulting Physician (Dermatology) Janeann Forehand, MD as Consulting Physician (Sports Medicine) Puschinsky, Fransico Him., MD as Consulting Physician (General Surgery) Pill, Barnabas Lister, MD as Consulting Physician (Orthopedic Surgery) Merilynn Finland., DDS as Consulting Physician (Dentistry) Frazier Butt., MD (Obstetrics and Gynecology) Marti Sleigh, MD as Referring Physician (Gynecology)  Indicate any recent Medical Services you may have received  from other than [facility] providers in the past year (date may be approximate).     Assessment:   This is a routine wellness examination for [place].  Hearing/Vision screen No results found.  Dietary issues and exercise activities discussed: Current Exercise Habits: Home exercise routine;Structured exercise class, Type of exercise: walking;calisthenics, Time (Minutes): 60, Frequency (Times/Week): 5, Weekly Exercise (Minutes/Week): 300, Intensity: Mild, Exercise limited by: None identified   Goals Addressed   None    Depression Screen    12/29/2021    8:35 AM 04/21/2021    8:55 AM 10/19/2020    9:38 AM 10/19/2020    8:58 AM 08/14/2018    8:21 AM 01/31/2018   10:16 AM 01/31/2018    8:46 AM  PHQ 2/9 Scores  PHQ - 2 Score 1 0 0 0 0 0 0  Exception Documentation  Medical reason         Fall Risk    12/29/2021    8:34 AM 04/21/2021    8:55 AM 10/19/2020    9:37 AM 10/19/2020    8:58 AM 10/16/2019    9:49 AM  [place] in the past year? 0 0 0 0 0  Number falls in past yr: 0 0 0    Injury with Fall? 0 0 0    Risk for fall due to : No Fall Risks  Follow up Falls evaluation completed  Falls prevention discussed      FALL RISK PREVENTION PERTAINING TO THE HOME:  Any stairs in or around the home? Yes  If so, are there any without handrails? No  Home free of loose throw rugs in walkways, pet beds, electrical cords, etc? Yes  Adequate lighting in your home to reduce risk of falls? Yes   ASSISTIVE DEVICES UTILIZED TO PREVENT FALLS:  Life alert? No  Use of a cane, walker or w/c? No  Grab bars in the bathroom? Yes  Shower chair or bench in shower? No  Elevated toilet seat or a handicapped toilet? Yes   TIMED UP AND GO:  Was the test performed? Yes .  Length of time to ambulate 10 feet: 6 sec.   Gait steady and fast without use of assistive device  Cognitive Function:    05/25/2017    9:31 AM  MMSE - Mini Mental State Exam  Orientation to time 5  Orientation to Place 5   Registration 3  Attention/ Calculation 5  Recall 3  Language- name 2 objects 2  Language- repeat 1  Language- follow 3 step command 3  Language- read & follow direction 1  Write a sentence 1  Copy design 1  Total score 30        12/29/2021    8:40 AM  6CIT Screen  What Year? 0 points  What month? 0 points  What time? 0 points  Count back from 20 0 points  Months in reverse 0 points  Repeat phrase 2 points  Total Score 2 points    Immunizations Immunization History  Administered Date(s) Administered   COVID-19, mRNA, vaccine(Comirnaty)12 years and older 11/14/2021   Fluad Quad(high Dose 65+) 10/23/2018   Influenza Split 11/17/2014, 10/31/2021   Influenza, High Dose Seasonal PF 12/08/2015, 11/23/2016, 11/06/2017, 10/19/2020   Influenza,inj,Quad PF,6+ Mos 11/17/2014   Influenza,inj,quad, With Preservative 11/17/2014   Influenza-Unspecified 12/11/2012, 12/08/2015, 10/31/2019   PFIZER(Purple Top)SARS-COV-2 Vaccination 03/12/2019, 04/02/2019, 12/02/2019, 05/21/2020   Pfizer Covid-19 Vaccine Bivalent Booster 63yr & up 11/26/2020, 07/26/2021   Pneumococcal Conjugate-13 10/02/2013   Pneumococcal Polysaccharide-23 02/03/2010, 10/22/2012, 09/21/2013, 10/02/2013, 12/31/2018   Respiratory Syncytial Virus Vaccine,Recomb Aduvanted(Arexvy) 12/09/2021   Tdap 11/10/2010, 10/19/2020   Zoster Recombinat (Shingrix) 08/01/2017, 10/31/2017   Zoster, Live 05/04/2010    TDAP status: Up to date  Flu Vaccine status: Up to date  Pneumococcal vaccine status: Up to date  Covid-19 vaccine status: Information provided on how to obtain vaccines.   Qualifies for Shingles Vaccine? Yes   Zostavax completed Yes   Shingrix Completed?: Yes  Screening Tests Health Maintenance  Topic Date Due   Diabetic kidney evaluation - Urine ACR  08/29/2019   COVID-19 Vaccine (7 - Pfizer risk series) 09/20/2021   Medicare Annual Wellness (AWV)  10/19/2021   OPHTHALMOLOGY EXAM  04/05/2022   FOOT EXAM   04/22/2022   HEMOGLOBIN A1C  04/27/2022   Diabetic kidney evaluation - GFR measurement  10/28/2022   TETANUS/TDAP  10/20/2030   Pneumonia Vaccine 76 Years old  Completed   INFLUENZA VACCINE  Completed   DEXA SCAN  Completed   Hepatitis C Screening  Completed   Zoster Vaccines- Shingrix  Completed   HPV VACCINES  Aged Out   COLONOSCOPY (Pts 45-410yrInsurance coverage will need to be confirmed)  Discontinued    Health Maintenance  Health Maintenance Due  Topic Date Due   Diabetic kidney evaluation - Urine ACR  08/29/2019   COVID-19  Vaccine (7 - Pfizer risk series) 09/20/2021   Medicare Annual Wellness (AWV)  10/19/2021    Colorectal cancer screening: Type of screening: Colonoscopy. Completed 07/16/15. Repeat every N/a years  Mammogram status: Completed 05/31/21. Repeat every year  Bone Density status: Completed 05/18/20. Results reflect: Bone density results: OSTEOPENIA. Repeat every 2 years.  Lung Cancer Screening: (Low Dose CT Chest recommended if Age 64-80 years, 30 pack-year currently smoking OR have quit w/in 15years.) does not qualify.   Lung Cancer Screening Referral: N/a  Additional Screening:  Hepatitis C Screening: does qualify; Completed 12/14/16  Vision Screening: Recommended annual ophthalmology exams for early detection of glaucoma and other disorders of the eye. Is the patient up to date with their annual eye exam?  Yes  Who is the provider or what is the name of the office in which the patient attends annual eye exams? Dr. Trinna Post If pt is not established with a provider, would they like to be referred to a provider to establish care? No .   Dental Screening: Recommended annual dental exams for proper oral hygiene  Community Resource Referral / Chronic Care Management: CRR required this visit?  No   CCM required this visit?  No      Plan:     I have personally reviewed and noted the following in the patient's chart:   Medical and social history Use  of alcohol, tobacco or illicit drugs  Current medications and supplements including opioid prescriptions. Patient is currently taking opioid prescriptions. Information provided to patient regarding non-opioid alternatives. Patient advised to discuss non-opioid treatment plan with their provider. Functional ability and status Nutritional status Physical activity Advanced directives List of other physicians Hospitalizations, surgeries, and ER visits in previous 12 months Vitals Screenings to include cognitive, depression, and falls Referrals and appointments  In addition, I have reviewed and discussed with patient certain preventive protocols, quality metrics, and best practice recommendations. A written personalized care plan for preventive services as well as general preventive health recommendations were provided to patient.     Beatris Ship, [place]   12/29/2021   Nurse Notes: None

## 2021-12-29 NOTE — Patient Instructions (Signed)
Jasmin [name] , Thank you for taking time to come for your Medicare Wellness Visit. I appreciate your ongoing commitment to your health goals. Please review the following plan we discussed and let me know if I can assist you in the future.   These are the goals we discussed:  Goals      Weight (lb) < 175 lb (79.4 kg)     Continuing to eat healthy and exercise. Following with Dr.Beasley.     Weight (lb) < 200 lb (90.7 kg)     By eating smaller portions. Eating more vegetables. Eating less red meats. Stay more active.        This is a list of the screening recommended for you and due dates:  Health Maintenance  Topic Date Due   Yearly kidney health urinalysis for diabetes  08/29/2019   COVID-19 Vaccine (7 - Pfizer risk series) 09/20/2021   Eye exam for diabetics  04/05/2022   Complete foot exam   04/22/2022   Hemoglobin A1C  04/27/2022   Yearly kidney function blood test for diabetes  10/28/2022   Medicare Annual Wellness Visit  12/30/2022   Tetanus Vaccine  10/20/2030   Pneumonia Vaccine  Completed   Flu Shot  Completed   DEXA scan (bone density measurement)  Completed   Hepatitis C Screening: USPSTF Recommendation to screen - Ages 61-79 yo.  Completed   Zoster (Shingles) Vaccine  Completed   HPV Vaccine  Aged Out   Colon Cancer Screening  Discontinued     Next appointment: Follow up in one year for your annual wellness visit Jan 03, 2023 8:20am   Preventive Care 65 Years and Older, Female Preventive care refers to lifestyle choices and visits with your health care provider that can promote health and wellness. What does preventive care include? A yearly physical exam. This is also called an annual well check. Dental exams once or twice a year. Routine eye exams. Ask your health care provider how often you should have your eyes checked. Personal lifestyle choices, including: Daily care of your teeth and gums. Regular physical activity. Eating a healthy diet. Avoiding  tobacco and drug use. Limiting alcohol use. Practicing safe sex. Taking low-dose aspirin every day. Taking vitamin and mineral supplements as recommended by your health care provider. What happens during an annual well check? The services and screenings done by your health care provider during your annual well check will depend on your age, overall health, lifestyle risk factors, and family history of disease. Counseling  Your health care provider may ask you questions about your: Alcohol use. Tobacco use. Drug use. Emotional well-being. Home and relationship well-being. Sexual activity. Eating habits. History of falls. Memory and ability to understand (cognition). Work and work Statistician. Reproductive health. Screening  You may have the following tests or measurements: Height, weight, and BMI. Blood pressure. Lipid and cholesterol levels. These may be checked every 5 years, or more frequently if you are over 61 years old. Skin check. Lung cancer screening. You may have this screening every year starting at age 72 if you have a 30-pack-year history of smoking and currently smoke or have quit within the past 15 years. Fecal occult blood test (FOBT) of the stool. You may have this test every year starting at age 8. Flexible sigmoidoscopy or colonoscopy. You may have a sigmoidoscopy every 5 years or a colonoscopy every 10 years starting at age 10. Hepatitis C blood test. Hepatitis B blood test. Sexually transmitted disease (STD) testing. Diabetes  screening. This is done by checking your blood sugar (glucose) after you have not eaten for a while (fasting). You may have this done every 1-3 years. Bone density scan. This is done to screen for osteoporosis. You may have this done starting at age 69. Mammogram. This may be done every 1-2 years. Talk to your health care provider about how often you should have regular mammograms. Talk with your health care provider about your test  results, treatment options, and if necessary, the need for more tests. Vaccines  Your health care provider may recommend certain vaccines, such as: Influenza vaccine. This is recommended every year. Tetanus, diphtheria, and acellular pertussis (Tdap, Td) vaccine. You may need a Td booster every 10 years. Zoster vaccine. You may need this after age 69. Pneumococcal 13-valent conjugate (PCV13) vaccine. One dose is recommended after age 48. Pneumococcal polysaccharide (PPSV23) vaccine. One dose is recommended after age 50. Talk to your health care provider about which screenings and vaccines you need and how often you need them. This information is not intended to replace advice given to you by your health care provider. Make sure you discuss any questions you have with your health care provider. Document Released: 03/06/2015 Document Revised: 10/28/2015 Document Reviewed: 12/09/2014 Elsevier Interactive Patient Education  2017 [place] Prevention in the Home Falls can cause injuries. They can happen to people of all ages. There are many things you can do to make your home safe and to help prevent falls. What can I do on the outside of my home? Regularly fix the edges of walkways and driveways and fix any cracks. Remove anything that might make you trip as you walk through a door, such as a raised step or threshold. Trim any bushes or trees on the path to your home. Use bright outdoor lighting. Clear any walking paths of anything that might make someone trip, such as rocks or tools. Regularly check to see if handrails are loose or broken. Make sure that both sides of any steps have handrails. Any raised decks and porches should have guardrails on the edges. Have any leaves, snow, or ice cleared regularly. Use sand or salt on walking paths during winter. Clean up any spills in your garage right away. This includes oil or grease spills. What can I do in the bathroom? Use night  lights. Install grab bars by the toilet and in the tub and shower. Do not use towel bars as grab bars. Use non-skid mats or decals in the tub or shower. If you need to sit down in the shower, use a plastic, non-slip stool. Keep the floor dry. Clean up any water that spills on the floor as soon as it happens. Remove soap buildup in the tub or shower regularly. Attach bath mats securely with double-sided non-slip rug tape. Do not have throw rugs and other things on the floor that can make you trip. What can I do in the bedroom? Use night lights. Make sure that you have a light by your bed that is easy to reach. Do not use any sheets or blankets that are too big for your bed. They should not hang down onto the floor. Have a firm chair that has side arms. You can use this for support while you get dressed. Do not have throw rugs and other things on the floor that can make you trip. What can I do in the kitchen? Clean up any spills right away. Avoid walking on wet floors. Keep  items that you use a lot in easy-to-reach places. If you need to reach something above you, use a strong step stool that has a grab bar. Keep electrical cords out of the way. Do not use floor polish or wax that makes floors slippery. If you must use wax, use non-skid floor wax. Do not have throw rugs and other things on the floor that can make you trip. What can I do with my stairs? Do not leave any items on the stairs. Make sure that there are handrails on both sides of the stairs and use them. Fix handrails that are broken or loose. Make sure that handrails are as long as the stairways. Check any carpeting to make sure that it is firmly attached to the stairs. Fix any carpet that is loose or worn. Avoid having throw rugs at the top or bottom of the stairs. If you do have throw rugs, attach them to the floor with carpet tape. Make sure that you have a light switch at the top of the stairs and the bottom of the stairs. If  you do not have them, ask someone to add them for you. What else can I do to help prevent falls? Wear shoes that: Do not have high heels. Have rubber bottoms. Are comfortable and fit you well. Are closed at the toe. Do not wear sandals. If you use a stepladder: Make sure that it is fully opened. Do not climb a closed stepladder. Make sure that both sides of the stepladder are locked into place. Ask someone to hold it for you, if possible. Clearly mark and make sure that you can see: Any grab bars or handrails. First and last steps. Where the edge of each step is. Use tools that help you move around (mobility aids) if they are needed. These include: Canes. Walkers. Scooters. Crutches. Turn on the lights when you go into a dark area. Replace any light bulbs as soon as they burn out. Set up your furniture so you have a clear path. Avoid moving your furniture around. If any of your floors are uneven, fix them. If there are any pets around you, be aware of where they are. Review your medicines with your doctor. Some medicines can make you feel dizzy. This can increase your chance of falling. Ask your doctor what other things that you can do to help prevent falls. This information is not intended to replace advice given to you by your health care provider. Make sure you discuss any questions you have with your health care provider. Document Released: 12/04/2008 Document Revised: 07/16/2015 Document Reviewed: 03/14/2014 Elsevier Interactive Patient Education  2017 [employer].

## 2022-01-06 ENCOUNTER — Other Ambulatory Visit (HOSPITAL_BASED_OUTPATIENT_CLINIC_OR_DEPARTMENT_OTHER): Payer: Self-pay

## 2022-01-07 ENCOUNTER — Ambulatory Visit (HOSPITAL_BASED_OUTPATIENT_CLINIC_OR_DEPARTMENT_OTHER)
Admission: RE | Admit: 2022-01-07 | Discharge: 2022-01-07 | Disposition: A | Discharge status: Home / Self Care | Payer: Medicare PPO | Source: Ambulatory Visit | Attending: Cardiology | Admitting: Cardiology

## 2022-01-07 DIAGNOSIS — I10 Essential (primary) hypertension: Secondary | ICD-10-CM | POA: Diagnosis not present

## 2022-01-08 LAB — ECHOCARDIOGRAM COMPLETE
AR max vel: 1.14 cm2
AV Area VTI: 1.08 cm2
AV Area mean vel: 1.02 cm2
AV Mean grad: 22 mmHg
AV Peak grad: 39.3 mmHg
Ao pk vel: 3.14 m/s
Area-P 1/2: 2.85 cm2
P 1/2 time: 318 msec
S' Lateral: 2.1 cm

## 2022-01-15 ENCOUNTER — Encounter: Payer: Self-pay | Admitting: Family Medicine

## 2022-01-17 ENCOUNTER — Other Ambulatory Visit: Payer: Self-pay

## 2022-01-17 ENCOUNTER — Ambulatory Visit: Payer: Medicare PPO | Admitting: Family Medicine

## 2022-01-17 ENCOUNTER — Other Ambulatory Visit (HOSPITAL_BASED_OUTPATIENT_CLINIC_OR_DEPARTMENT_OTHER): Payer: Self-pay

## 2022-01-17 ENCOUNTER — Encounter: Payer: Self-pay | Admitting: Family Medicine

## 2022-01-17 ENCOUNTER — Ambulatory Visit (HOSPITAL_BASED_OUTPATIENT_CLINIC_OR_DEPARTMENT_OTHER)
Admission: RE | Admit: 2022-01-17 | Discharge: 2022-01-17 | Disposition: A | Discharge status: Home / Self Care | Payer: Medicare PPO | Source: Ambulatory Visit | Attending: Family Medicine | Admitting: Family Medicine

## 2022-01-17 VITALS — BP 144/80 | HR 82 | Temp 97.7°F | Resp 18 | Ht 61.0 in | Wt 241.4 lb

## 2022-01-17 DIAGNOSIS — I10 Essential (primary) hypertension: Secondary | ICD-10-CM

## 2022-01-17 DIAGNOSIS — R051 Acute cough: Secondary | ICD-10-CM | POA: Insufficient documentation

## 2022-01-17 DIAGNOSIS — E1169 Type 2 diabetes mellitus with other specified complication: Secondary | ICD-10-CM

## 2022-01-17 DIAGNOSIS — E782 Mixed hyperlipidemia: Secondary | ICD-10-CM

## 2022-01-17 DIAGNOSIS — R059 Cough, unspecified: Secondary | ICD-10-CM | POA: Diagnosis not present

## 2022-01-17 DIAGNOSIS — R052 Subacute cough: Secondary | ICD-10-CM | POA: Diagnosis not present

## 2022-01-17 LAB — MICROALBUMIN / CREATININE URINE RATIO
Creatinine,U: 51.9 mg/dL
Microalb Creat Ratio: 1.7 mg/g (ref 0.0–30.0)
Microalb, Ur: 0.9 mg/dL (ref 0.0–1.9)

## 2022-01-17 LAB — HEMOGLOBIN A1C: Hgb A1c MFr Bld: 6.7 % — ABNORMAL HIGH (ref 4.6–6.5)

## 2022-01-17 LAB — POC COVID19 BINAXNOW: SARS Coronavirus 2 Ag: NEGATIVE

## 2022-01-17 MED ORDER — PREDNISONE 20 MG PO TABS
ORAL_TABLET | ORAL | 0 refills | Status: AC
  Filled 2022-01-17 (×2): qty 9, 6d supply, fill #0

## 2022-01-17 MED ORDER — ALBUTEROL SULFATE HFA 108 (90 BASE) MCG/ACT IN AERS
2.0000 | INHALATION_SPRAY | Freq: Four times a day (QID) | RESPIRATORY_TRACT | 0 refills | Status: DC | PRN
  Filled 2022-01-17: qty 6.7, 25d supply, fill #0

## 2022-01-17 MED ORDER — HYDROCODONE BIT-HOMATROP MBR 5-1.5 MG/5ML PO SOLN
5.0000 mL | Freq: Three times a day (TID) | ORAL | 0 refills | Status: DC | PRN
  Filled 2022-01-17: qty 90, 6d supply, fill #0

## 2022-01-17 NOTE — Progress Notes (Addendum)
[place] at [facility] [address] [place], [place] [address] [phone]  Date:  01/17/2022   Name:  Jasmin [name]   DOB:  Jun 24, 1945   MRN:  [id number]  PCP:  Darreld Mclean, MD    Chief Complaint: Cough (Pt has gone for CXR- awaiting results. X three/four days. Pt would like to see if this is medication induced. Sometimes the cough is productive, other times it is not. No other sxs. Cough is worsening. )   History of Present Illness:  Jasmin [name] is a 76 y.o. very pleasant female patient who presents with the following:  Patient seen today for concern of illness/cough Most recent visit with myself was in September History of well-controlled diabetes, hypertension, hyperlipidemia, stroke, OSA, obesity, pulmonary hypertension  She is followed by pulmonology, Dr. Camillo Flaming for her pulmonary hypertension and sleep apnea  She does tend to have chronic thick mucus in her lungs, especially in the morning.  At our last visit she noted some shortness of breath with exercise but otherwise breathing comfortably  Can offer urine microalbumin She already had her flu, RSV, and latest COVID booster She reached out to me on 11/25 with the following: Dr. Lorelei Pont, I have been having coughing spells for the last three/four days.  Most of the time they start after I have eaten. Not sure if they're caused by medication or something else. No other symptoms. Do I need to try to schedule an appointment with YOU?   Pt reports that her husband said she has coughed for a week or so The cough tends to be worst in the am but can be during the day as well She otherwise feels well She hosted 15 people for thanksgiving and felt well/ strong Her energy level is good, no fever, no aches except normal joint pain She did try some dayquil No new meds She does note some wheezing  Lab Results  Component Value Date   HGBA1C 6.9 (H) 10/27/2021    DG  Chest 2 View  Result Date: 01/17/2022 CLINICAL DATA:  Cough EXAM: CHEST - 2 VIEW COMPARISON:  12/31/2018 FINDINGS: The heart size and mediastinal contours are within normal limits. Both lungs are clear. No pneumothorax or pleural effusion. Aorta is calcified. There are thoracic degenerative changes. IMPRESSION: No active cardiopulmonary disease. Electronically Signed   By: Sammie Bench M.D.   On: 01/17/2022 09:19    Patient Active Problem List   Diagnosis Date Noted   Diabetes mellitus ([place]) 11/18/2021   Skin cancer 11/04/2020   BMI 45.0-49.9, adult ([place]) 07/31/2020   Swelling of both lower extremities    Shortness of breath on exertion    Red eyes    Nasal discharge    Leg cramping    Knee pain    Joint pain    Easy bruising    Dry mouth    Decreased hearing    Back pain    Urinary frequency 12/26/2018   Nocturia 12/26/2018   Recurrent UTI 12/26/2018   Vaginal atrophy 12/26/2018   Hyperopia with astigmatism and presbyopia, bilateral 02/09/2018   Long term current use of oral hypoglycemic drug 02/09/2018   PCO (posterior capsular opacification), left 02/09/2018   Pseudophakia of left eye 02/09/2018   PVD (posterior vitreous detachment), both eyes 02/09/2018   Cough 03/17/2017   Arthritis 12/16/2016   UTI (urinary tract infection)    Stroke [facility])    Sleep apnea  Right kidney stone    Hyperlipidemia    Heart murmur    Diabetes mellitus without complication ([place])    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   Cystocele with rectocele 08/21/2016   Mixed hyperlipidemia 08/08/2016   Controlled type 2 diabetes mellitus without complication, without long-term current use of insulin ([place]) 08/08/2016   Essential hypertension 08/08/2016   History of CVA (cerebrovascular accident) 08/08/2016   Nuclear sclerotic cataract of left eye 07/07/2016   OAB (overactive bladder) 01/20/2016   Class 3 severe obesity with serious comorbidity and body mass index (BMI) of 45.0 to  49.9 in adult ([place]) 11/10/2015   Contusion of right knee 09/09/2015   Quadriceps weakness 09/09/2015   Right shoulder pain 08/21/2015   IBS (irritable bowel syndrome) 07/16/2015   Insomnia 07/16/2015   Left ventricular hypertrophy 07/16/2015   Pulmonary hypertension ([place]) 07/16/2015   Primary osteoarthritis of left knee 03/31/2015   Acid reflux disease 03/09/2015   Aortic stenosis 03/09/2015   AR (allergic rhinitis) 03/09/2015   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   OSA (obstructive sleep apnea) 03/09/2015   Osteoporosis 03/09/2015   Vitamin D deficiency 03/09/2015   Left tibialis posterior tendinitis 12/13/2013   Aftercare following right knee joint replacement surgery 12/04/2013   Sprain of ankle, left 11/15/2013   Overweight 08/16/2013   Broken bones 08/13/2013    Past Medical History:  Diagnosis Date   Acid reflux disease 03/09/2015   Aortic stenosis 03/09/2015   AR (allergic rhinitis) 03/09/2015   Arthritis    Back pain    Controlled type 2 diabetes mellitus without complication, without long-term current use of insulin ([place]) 08/08/2016   Cough    Cystocele with rectocele 08/21/2016   Decreased hearing    Diabetes mellitus without complication (HCC)    Dry mouth    Easy bruising    Essential hypertension 08/08/2016   Heart murmur    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   History of CVA (cerebrovascular accident) 08/08/2016   Seen on MRI from 08/2015   Hyperlipidemia    Hypertension    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   IBS (irritable bowel syndrome) 07/16/2015   Insomnia 07/16/2015   Joint pain    Knee pain    Left ventricular hypertrophy 07/16/2015   Leg cramping    right leg   Mixed hyperlipidemia 08/08/2016   Morbid obesity ([place]) 11/10/2015   Nasal discharge    Nuclear sclerotic cataract of left eye 07/07/2016   OAB (overactive bladder) 01/20/2016   OSA (obstructive sleep apnea) 03/09/2015   Osteoporosis 03/09/2015   Primary osteoarthritis of left knee  03/31/2015   Pulmonary hypertension ([place]) 07/16/2015   Red eyes    Right kidney stone    Shortness of breath on exertion    Skin cancer 11/04/2020   Sleep apnea    Stroke ([place])    Swelling of both lower extremities    UTI (urinary tract infection)    Vitamin D deficiency 03/09/2015    Past Surgical History:  Procedure Laterality Date   ABDOMINAL HYSTERECTOMY  09/14/2016   CATARACT EXTRACTION Left    CATARACT EXTRACTION Left 06/2016   COLPORRHAPHY N/A 09/14/2016   [facility]   CYSTOCELE REPAIR     FRACTURE SURGERY Left 1953   L arm   FRACTURE SURGERY Left 1985   L ankle   HERNIA REPAIR  2004   JOINT REPLACEMENT Right 2012   R TKA   JOINT REPLACEMENT Left 2017   KIDNEY STONE SURGERY  LAPAROSCOPIC ASSISTED VAGINAL HYSTERECTOMY N/A 09/14/2016   [facility]   R foot/ankle Right 2013 and 2014   plate and screws lateral foot and tendon removal medial foot in 2013, revision in 2014   [facility]     URETEROSCOPY      Social History   Tobacco Use   Smoking status: Never    Passive exposure: Never   Smokeless tobacco: Never  Vaping Use   Vaping Use: Never used  Substance Use Topics   Alcohol use: Yes    Alcohol/week: 2.0 - 3.0 standard drinks of alcohol    Types: 2 - 3 Glasses of wine per week    Comment: couple of glasses per week   Drug use: No    Family History  Problem Relation Age of Onset   Hyperlipidemia Mother    Heart disease Mother    Hypertension Mother    Stroke Mother    Thyroid disease Mother    Hypertension Father    Heart disease Father     Allergies  Allergen Reactions   Latex Itching   Ciprofloxacin Hives   Metronidazole Hives   Adhesive [Tape] Rash    Medication list has been reviewed and updated.  Current Outpatient Medications on File Prior to Visit  Medication Sig Dispense Refill   Accu-Chek Softclix Lancets lancets USE AS DIRECTED TO CHECK BLOOD GLUCOSE ONCE DAILY 100 each 3   Adhesive Tape (RA ADHESIVE  1"X10YD) TAPE 2 inch Paper tape roll 2 each 0   amoxicillin (AMOXIL) 500 MG capsule Take prior to dentist procedure     Ascorbic Acid (VITAMIN C WITH ROSE HIPS) 500 MG tablet Take 500 mg by mouth daily.     blood glucose meter kit and supplies KIT Dispense based on patient and insurance preference. Use once daily to monitor glucose as needed (FOR ICD-9 250.00, 250.01). 1 each 0   CALCIUM PO Take 1 tablet by mouth daily.     Cholecalciferol (VITAMIN D3) 2000 units TABS Take by mouth. Take one tablet in the AM     clopidogrel (PLAVIX) 75 MG tablet Take 1 tablet (75 mg total) by mouth daily. 90 tablet 1   clotrimazole-betamethasone (LOTRISONE) cream Apply to plantar foot daily 45 g 11   desmopressin (DDAVP) 0.2 MG tablet Take 1 tablet by mouth nightly 90 tablet 3   desonide (DESOWEN) 0.05 % cream Apply a thin layer to the affected area twice daily for 2 weeks then stop for 1 week. Repeat for flares. 60 g 0   econazole nitrate 1 % cream Apply topically as needed.      estradiol (ESTRACE) 0.1 MG/GM vaginal cream Apply 0.5 grams vaginally nightly for 2 weeks, then apply 0.5 grams 2 times weekly 42.5 g 3   fluticasone (FLONASE) 50 MCG/ACT nasal spray Place 2 sprays into both nostrils daily. 48 g 3   Gauze Pads & Dressings (CURITY ABDOMINAL) 5"X9" PADS ABD pad- Apply twice a day to cover incision and secure with paper tape.  DO this once your Aquacel is removed. 20 each 0   glucose blood (ACCU-CHEK GUIDE) test strip USE AS DIRECTED TO CHECK BLOOD GLUCOSE ONCE DAILY 100 strip 3   losartan (COZAAR) 50 MG tablet Take 1 & 1/2 tablets (75 mg total) by mouth daily. 135 tablet 3   metFORMIN (GLUCOPHAGE) 500 MG tablet TAKE 1 TABLET (500 MG TOTAL) BY MOUTH 2 (TWO) TIMES DAILY WITH A MEAL. 180 tablet 1  mirabegron ER (MYRBETRIQ) 50 MG TB24 tablet Take 1 tablet (50 mg total) by mouth daily. 90 tablet 0   omega-3 acid ethyl esters (LOVAZA) 1 g capsule TAKE 2 CAPSULES BY MOUTH TWICE DAILY 360 capsule 2   omeprazole  (PRILOSEC) 40 MG capsule Take 1 capsule (40 mg total) by mouth daily. 30 capsule 11   OXYCODONE HCL PO Take by mouth as needed.     Polyethyl Glycol-Propyl Glycol (SYSTANE OP) Apply to eye.     PSYLLIUM PO Take by mouth.     RSV vaccine recomb adjuvanted (AREXVY) 120 MCG/0.5ML injection Inject into the muscle. 1 each 0   simvastatin (ZOCOR) 20 MG tablet TAKE 1 TABLET (20 MG TOTAL) BY MOUTH AT BEDTIME. 90 tablet 3   solifenacin (VESICARE) 10 MG tablet Take 1 tablet (10 mg total) by mouth daily. 30 tablet 1   Vitamin D, Ergocalciferol, (DRISDOL) 1.25 MG (50000 UNIT) CAPS capsule Take 1 capsule (50,000 Units total) by mouth every 7 (seven) days. 4 capsule 0   No current facility-administered medications on file prior to visit.    Review of Systems:  As per HPI- otherwise negative.   Physical Examination: Vitals:   01/17/22 0922  BP: (!) 144/80  Pulse: 82  Resp: 18  Temp: 97.7 F (36.5 C)  SpO2: 97%   Vitals:   01/17/22 0922  Weight: 241 lb 6.4 oz (109.5 kg)  Height: _0  (1.549 m)   Body mass index is 45.61 kg/m. Ideal Body Weight: Weight in (lb) to have BMI = 25: 132  GEN: no acute distress.  Obese, looks well  HEENT: Atraumatic, Normocephalic.  Ears and Nose: No external deformity. CV: RRR, No M/G/R. No JVD. No thrill. No extra heart sounds. PULM: CTA B, no wheezes, crackles, rhonchi. No retractions. No resp. distress. No accessory muscle use. ABD: S, NT, ND, +BS. No rebound. No HSM. EXTR: No c/c/e PSYCH: Normally interactive. Conversant.   Results for orders placed or performed in visit on 01/17/22  POC COVID-19 BinaxNow  Result Value Ref Range   SARS Coronavirus 2 Ag Negative Negative    Assessment and Plan: Subacute cough - Plan: POC COVID-19 BinaxNow, albuterol (VENTOLIN HFA) 108 (90 Base) MCG/ACT inhaler, HYDROcodone bit-homatropine (HYCODAN) 5-1.5 MG/5ML syrup, predniSONE (DELTASONE) 20 MG tablet, CANCELED: DG Chest 2 View  Type 2 diabetes mellitus with  other specified complication, without long-term current use of insulin (HCC) - Plan: Hemoglobin A1c, Microalbumin / creatinine urine ratio  Essential hypertension   Patient seen today for cough for about 1 week as above.  Chest x-ray is negative, COVID-negative.  She does have pulmonary hypertension and tends to have some thick mucus which is followed by her pulmonologist.  We will have her take a conservative dose of prednisone for 6 days, gave albuterol and Hycodan to use as needed.  Caution regarding sedation with Hycodan  I have asked her to let me know if not feeling better in the next week or 2, certainly sooner if getting worse We will follow-up on her A1c today-she is interested in potentially starting a GLP-1 drug.  Once her A1c comes back we can discuss this via MyChart  I did advise her prednisone may cause her blood sugar to go up temporarily, if going higher than 300 or so stop prednisone and let me know  Blood pressure well controlled on current regimen- losartan    Signed Lamar Blinks, MD  Received labs- message to pt  Results for orders placed or performed  in visit on 01/17/22  Hemoglobin A1c  Result Value Ref Range   Hgb A1c MFr Bld 6.7 (H) 4.6 - 6.5 %  Microalbumin / creatinine urine ratio  Result Value Ref Range   Microalb, Ur 0.9 0.0 - 1.9 mg/dL   Creatinine,U 51.9 mg/dL   Microalb Creat Ratio 1.7 0.0 - 30.0 mg/g  POC COVID-19 BinaxNow  Result Value Ref Range   SARS Coronavirus 2 Ag Negative Negative

## 2022-01-17 NOTE — Patient Instructions (Addendum)
Please let me know if you are not feeling better soon -cough can oftentimes last for a few weeks, but let me know if you do not notice improvement or if you are feeling worse, or other symptoms such as fever  Albuterol inhaler as needed for wheezing Cough syrup as needed, remember this will make you drowsy.  Do not use it if you need to drive Prednisone for 6 days.  This can make your blood sugars go up a bit, let me know if anything over about 300 I will be in touch with your lab work ASAP

## 2022-01-18 ENCOUNTER — Ambulatory Visit (INDEPENDENT_AMBULATORY_CARE_PROVIDER_SITE_OTHER): Payer: Medicare PPO | Admitting: Family Medicine

## 2022-01-24 ENCOUNTER — Other Ambulatory Visit (HOSPITAL_BASED_OUTPATIENT_CLINIC_OR_DEPARTMENT_OTHER): Payer: Self-pay

## 2022-01-24 MED ORDER — HYDROCODONE BIT-HOMATROP MBR 5-1.5 MG/5ML PO SOLN
5.0000 mL | Freq: Three times a day (TID) | ORAL | 0 refills | Status: AC | PRN
  Filled 2022-01-24: qty 90, 6d supply, fill #0

## 2022-01-24 NOTE — Addendum Note (Signed)
Addended by: Lamar Blinks C on: 01/24/2022 02:23 PM   Modules accepted: Orders

## 2022-01-25 NOTE — Addendum Note (Signed)
Addended by: Lamar Blinks C on: 01/25/2022 06:15 AM   Modules accepted: Orders

## 2022-01-27 ENCOUNTER — Other Ambulatory Visit (HOSPITAL_BASED_OUTPATIENT_CLINIC_OR_DEPARTMENT_OTHER): Payer: Medicare PPO

## 2022-01-31 ENCOUNTER — Other Ambulatory Visit (HOSPITAL_BASED_OUTPATIENT_CLINIC_OR_DEPARTMENT_OTHER): Payer: Self-pay

## 2022-01-31 ENCOUNTER — Ambulatory Visit (INDEPENDENT_AMBULATORY_CARE_PROVIDER_SITE_OTHER): Payer: Medicare PPO | Admitting: Family Medicine

## 2022-02-02 ENCOUNTER — Other Ambulatory Visit (HOSPITAL_BASED_OUTPATIENT_CLINIC_OR_DEPARTMENT_OTHER): Payer: Self-pay

## 2022-02-07 ENCOUNTER — Other Ambulatory Visit (HOSPITAL_BASED_OUTPATIENT_CLINIC_OR_DEPARTMENT_OTHER): Payer: Self-pay

## 2022-02-09 DIAGNOSIS — M47816 Spondylosis without myelopathy or radiculopathy, lumbar region: Secondary | ICD-10-CM | POA: Diagnosis not present

## 2022-02-09 DIAGNOSIS — Z96641 Presence of right artificial hip joint: Secondary | ICD-10-CM | POA: Diagnosis not present

## 2022-02-09 DIAGNOSIS — M1909 Primary osteoarthritis, other specified site: Secondary | ICD-10-CM | POA: Diagnosis not present

## 2022-02-09 DIAGNOSIS — M461 Sacroiliitis, not elsewhere classified: Secondary | ICD-10-CM | POA: Diagnosis not present

## 2022-02-09 DIAGNOSIS — Z471 Aftercare following joint replacement surgery: Secondary | ICD-10-CM | POA: Diagnosis not present

## 2022-02-09 DIAGNOSIS — I998 Other disorder of circulatory system: Secondary | ICD-10-CM | POA: Diagnosis not present

## 2022-02-10 ENCOUNTER — Other Ambulatory Visit: Payer: Self-pay | Admitting: Family Medicine

## 2022-02-10 ENCOUNTER — Other Ambulatory Visit (HOSPITAL_BASED_OUTPATIENT_CLINIC_OR_DEPARTMENT_OTHER): Payer: Self-pay

## 2022-02-10 DIAGNOSIS — E119 Type 2 diabetes mellitus without complications: Secondary | ICD-10-CM

## 2022-02-10 MED ORDER — METFORMIN HCL 500 MG PO TABS
500.0000 mg | ORAL_TABLET | Freq: Two times a day (BID) | ORAL | 1 refills | Status: DC
  Filled 2022-02-10: qty 180, 90d supply, fill #0
  Filled 2022-05-09: qty 180, 90d supply, fill #1

## 2022-02-17 ENCOUNTER — Ambulatory Visit (HOSPITAL_BASED_OUTPATIENT_CLINIC_OR_DEPARTMENT_OTHER)
Admission: RE | Admit: 2022-02-17 | Discharge: 2022-02-17 | Disposition: A | Discharge status: Home / Self Care | Payer: Medicare PPO | Source: Ambulatory Visit | Attending: Family Medicine | Admitting: Family Medicine

## 2022-02-17 DIAGNOSIS — E782 Mixed hyperlipidemia: Secondary | ICD-10-CM

## 2022-02-18 ENCOUNTER — Telehealth (HOSPITAL_BASED_OUTPATIENT_CLINIC_OR_DEPARTMENT_OTHER): Payer: Self-pay

## 2022-02-18 ENCOUNTER — Encounter: Payer: Self-pay | Admitting: Family Medicine

## 2022-02-18 DIAGNOSIS — R931 Abnormal findings on diagnostic imaging of heart and coronary circulation: Secondary | ICD-10-CM

## 2022-02-18 NOTE — Addendum Note (Signed)
Addended by: Lamar Blinks C on: 02/18/2022 04:11 PM   Modules accepted: Orders

## 2022-02-21 NOTE — Progress Notes (Unsigned)
[place] at [facility] [address], [place], [place] [address] [phone]  Date:  02/24/2022   Name:  Jasmin [name]   DOB:  19-Jul-1945   MRN:  [id number]  PCP:  Darreld Mclean, MD    Chief Complaint: No chief complaint on file.   History of Present Illness:  Jasmin [name] is a 78 y.o. very pleasant female patient who presents with the following:  Pt seen today to discuss her recent coronary calcium score  I saw her in November  History of well-controlled diabetes, hypertension, hyperlipidemia, stroke, OSA, obesity, pulmonary hypertension   She is followed by pulmonology, Dr. Camillo Flaming for her pulmonary hypertension and sleep apnea  Ca calcium 12/28 IMPRESSION: Coronary calcium score of 1245. This was 95th percentile for age-, race-, and sex-matched controls. Severe mitral annular calcification. Aortic atherosclerosis. Recommend aggressive risk factor management, aspirin 91m and LDL goal <70. Consider cardiology consultation.  Patient Active Problem List   Diagnosis Date Noted   Diabetes mellitus ([place] 11/18/2021   Skin cancer 11/04/2020   BMI 45.0-49.9, adult ([facility] 07/31/2020   Swelling of both lower extremities    Shortness of breath on exertion    Red eyes    Nasal discharge    Leg cramping    Knee pain    Joint pain    Easy bruising    Dry mouth    Decreased hearing    Back pain    Urinary frequency 12/26/2018   Nocturia 12/26/2018   Recurrent UTI 12/26/2018   Vaginal atrophy 12/26/2018   Hyperopia with astigmatism and presbyopia, bilateral 02/09/2018   Long term current use of oral hypoglycemic drug 02/09/2018   PCO (posterior capsular opacification), left 02/09/2018   Pseudophakia of left eye 02/09/2018   PVD (posterior vitreous detachment), both eyes 02/09/2018   Cough 03/17/2017   Arthritis 12/16/2016   UTI (urinary tract infection)    Stroke ([facility]    Sleep apnea    Right kidney stone     Hyperlipidemia    Heart murmur    Diabetes mellitus without complication ([place]    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   Cystocele with rectocele 08/21/2016   Mixed hyperlipidemia 08/08/2016   Controlled type 2 diabetes mellitus without complication, without long-term current use of insulin ([place] 08/08/2016   Essential hypertension 08/08/2016   History of CVA (cerebrovascular accident) 08/08/2016   Nuclear sclerotic cataract of left eye 07/07/2016   OAB (overactive bladder) 01/20/2016   Class 3 severe obesity with serious comorbidity and body mass index (BMI) of 45.0 to 49.9 in adult ([place] 11/10/2015   Contusion of right knee 09/09/2015   Quadriceps weakness 09/09/2015   Right shoulder pain 08/21/2015   IBS (irritable bowel syndrome) 07/16/2015   Insomnia 07/16/2015   Left ventricular hypertrophy 07/16/2015   Pulmonary hypertension ([facility] 07/16/2015   Primary osteoarthritis of left knee 03/31/2015   Acid reflux disease 03/09/2015   Aortic stenosis 03/09/2015   AR (allergic rhinitis) 03/09/2015   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   OSA (obstructive sleep apnea) 03/09/2015   Osteoporosis 03/09/2015   Vitamin D deficiency 03/09/2015   Left tibialis posterior tendinitis 12/13/2013   Aftercare following right knee joint replacement surgery 12/04/2013   Sprain of ankle, left 11/15/2013   Overweight 08/16/2013   Broken bones 08/13/2013    Past Medical History:  Diagnosis Date   Acid reflux disease 03/09/2015   Aortic stenosis 03/09/2015   AR (allergic rhinitis)  03/09/2015   Arthritis    Back pain    Controlled type 2 diabetes mellitus without complication, without long-term current use of insulin ([place]) 08/08/2016   Cough    Cystocele with rectocele 08/21/2016   Decreased hearing    Diabetes mellitus without complication (HCC)    Dry mouth    Easy bruising    Essential hypertension 08/08/2016   Heart murmur    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   History  of CVA (cerebrovascular accident) 08/08/2016   Seen on MRI from 08/2015   Hyperlipidemia    Hypertension    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   IBS (irritable bowel syndrome) 07/16/2015   Insomnia 07/16/2015   Joint pain    Knee pain    Left ventricular hypertrophy 07/16/2015   Leg cramping    right leg   Mixed hyperlipidemia 08/08/2016   Morbid obesity ([place]) 11/10/2015   Nasal discharge    Nuclear sclerotic cataract of left eye 07/07/2016   OAB (overactive bladder) 01/20/2016   OSA (obstructive sleep apnea) 03/09/2015   Osteoporosis 03/09/2015   Primary osteoarthritis of left knee 03/31/2015   Pulmonary hypertension ([place]) 07/16/2015   Red eyes    Right kidney stone    Shortness of breath on exertion    Skin cancer 11/04/2020   Sleep apnea    Stroke ([place])    Swelling of both lower extremities    UTI (urinary tract infection)    Vitamin D deficiency 03/09/2015    Past Surgical History:  Procedure Laterality Date   ABDOMINAL HYSTERECTOMY  09/14/2016   CATARACT EXTRACTION Left    CATARACT EXTRACTION Left 06/2016   COLPORRHAPHY N/A 09/14/2016   [facility]   CYSTOCELE REPAIR     FRACTURE SURGERY Left 1953   L arm   FRACTURE SURGERY Left 1985   L ankle   HERNIA REPAIR  2004   JOINT REPLACEMENT Right 2012   R TKA   JOINT REPLACEMENT Left 2017   KIDNEY STONE SURGERY     LAPAROSCOPIC ASSISTED VAGINAL HYSTERECTOMY N/A 09/14/2016   [facility]   R foot/ankle Right 2013 and 2014   plate and screws lateral foot and tendon removal medial foot in 2013, revision in 2014   [place]     URETEROSCOPY      Social History   Tobacco Use   Smoking status: Never    Passive exposure: Never   Smokeless tobacco: Never  Vaping Use   Vaping Use: Never used  Substance Use Topics   Alcohol use: Yes    Alcohol/week: 2.0 - 3.0 standard drinks of alcohol    Types: 2 - 3 Glasses of wine per week    Comment: couple of glasses per week   Drug use: No    Family History   Problem Relation Age of Onset   Hyperlipidemia Mother    Heart disease Mother    Hypertension Mother    Stroke Mother    Thyroid disease Mother    Hypertension Father    Heart disease Father     Allergies  Allergen Reactions   Latex Itching   Ciprofloxacin Hives   Metronidazole Hives   Adhesive [Tape] Rash    Medication list has been reviewed and updated.  Current Outpatient Medications on File Prior to Visit  Medication Sig Dispense Refill   Accu-Chek Softclix Lancets lancets USE AS DIRECTED TO CHECK BLOOD GLUCOSE ONCE DAILY 100 each 3   albuterol (VENTOLIN  HFA) 108 (90 Base) MCG/ACT inhaler Inhale 2 puffs into the lungs every 6 (six) hours as needed for wheezing or shortness of breath. 6.7 g 0   amoxicillin (AMOXIL) 500 MG capsule Take prior to dentist procedure     Ascorbic Acid (VITAMIN C WITH ROSE HIPS) 500 MG tablet Take 500 mg by mouth daily.     blood glucose meter kit and supplies KIT Dispense based on patient and insurance preference. Use once daily to monitor glucose as needed (FOR ICD-9 250.00, 250.01). 1 each 0   CALCIUM PO Take 1 tablet by mouth daily.     Cholecalciferol (VITAMIN D3) 2000 units TABS Take by mouth. Take one tablet in the AM     clopidogrel (PLAVIX) 75 MG tablet Take 1 tablet (75 mg total) by mouth daily. 90 tablet 1   clotrimazole-betamethasone (LOTRISONE) cream Apply to plantar foot daily 45 g 11   desmopressin (DDAVP) 0.2 MG tablet Take 1 tablet by mouth nightly 90 tablet 3   desonide (DESOWEN) 0.05 % cream Apply a thin layer to the affected area twice daily for 2 weeks then stop for 1 week. Repeat for flares. 60 g 0   econazole nitrate 1 % cream Apply topically as needed.      estradiol (ESTRACE) 0.1 MG/GM vaginal cream Apply 0.5 grams vaginally nightly for 2 weeks, then apply 0.5 grams 2 times weekly 42.5 g 3   fluticasone (FLONASE) 50 MCG/ACT nasal spray Place 2 sprays into both nostrils daily. 48 g 3   glucose blood (ACCU-CHEK GUIDE) test  strip USE AS DIRECTED TO CHECK BLOOD GLUCOSE ONCE DAILY 100 strip 3   losartan (COZAAR) 50 MG tablet Take 1 & 1/2 tablets (75 mg total) by mouth daily. 135 tablet 3   metFORMIN (GLUCOPHAGE) 500 MG tablet Take 1 tablet (500 mg total) by mouth 2 (two) times daily with a meal. 180 tablet 1   mirabegron ER (MYRBETRIQ) 50 MG TB24 tablet Take 1 tablet (50 mg total) by mouth daily. 90 tablet 0   omega-3 acid ethyl esters (LOVAZA) 1 g capsule TAKE 2 CAPSULES BY MOUTH TWICE DAILY 360 capsule 2   omeprazole (PRILOSEC) 40 MG capsule Take 1 capsule (40 mg total) by mouth daily. 30 capsule 11   OXYCODONE HCL PO Take by mouth as needed.     Polyethyl Glycol-Propyl Glycol (SYSTANE OP) Apply to eye.     PSYLLIUM PO Take by mouth.     simvastatin (ZOCOR) 20 MG tablet TAKE 1 TABLET (20 MG TOTAL) BY MOUTH AT BEDTIME. 90 tablet 3   solifenacin (VESICARE) 10 MG tablet Take 1 tablet (10 mg total) by mouth daily. 30 tablet 1   Vitamin D, Ergocalciferol, (DRISDOL) 1.25 MG (50000 UNIT) CAPS capsule Take 1 capsule (50,000 Units total) by mouth every 7 (seven) days. 4 capsule 0   No current facility-administered medications on file prior to visit.    Review of Systems:  As per HPI- otherwise negative.   Physical Examination: There were no vitals filed for this visit. There were no vitals filed for this visit. There is no height or weight on file to calculate BMI. Ideal Body Weight:    GEN: no acute distress. HEENT: Atraumatic, Normocephalic.  Ears and Nose: No external deformity. CV: RRR, No M/G/R. No JVD. No thrill. No extra heart sounds. PULM: CTA B, no wheezes, crackles, rhonchi. No retractions. No resp. distress. No accessory muscle use. ABD: S, NT, ND, +BS. No rebound. No HSM. EXTR: No c/c/e  PSYCH: Normally interactive. Conversant.    Assessment and Plan: ***  Signed Lamar Blinks, MD

## 2022-02-23 ENCOUNTER — Other Ambulatory Visit (HOSPITAL_BASED_OUTPATIENT_CLINIC_OR_DEPARTMENT_OTHER): Payer: Self-pay

## 2022-02-23 ENCOUNTER — Other Ambulatory Visit: Payer: Self-pay | Admitting: Family Medicine

## 2022-02-23 MED ORDER — ACCU-CHEK GUIDE VI STRP
ORAL_STRIP | 3 refills | Status: DC
  Filled 2022-02-23: qty 100, 50d supply, fill #0
  Filled 2022-04-14: qty 100, 50d supply, fill #1
  Filled 2022-05-30: qty 100, 50d supply, fill #2
  Filled 2022-07-18: qty 100, 50d supply, fill #3

## 2022-02-23 MED ORDER — ACCU-CHEK SOFTCLIX LANCETS MISC
3 refills | Status: DC
  Filled 2022-02-23 – 2022-03-27 (×2): qty 100, 90d supply, fill #0
  Filled 2022-06-30: qty 100, 90d supply, fill #1
  Filled 2022-09-27: qty 100, 90d supply, fill #2
  Filled 2022-12-26: qty 100, 90d supply, fill #3

## 2022-02-24 ENCOUNTER — Ambulatory Visit: Payer: Medicare PPO | Attending: Cardiology | Admitting: Cardiology

## 2022-02-24 ENCOUNTER — Ambulatory Visit: Payer: Medicare PPO | Admitting: Family Medicine

## 2022-02-24 ENCOUNTER — Encounter: Payer: Self-pay | Admitting: Cardiology

## 2022-02-24 ENCOUNTER — Other Ambulatory Visit (HOSPITAL_BASED_OUTPATIENT_CLINIC_OR_DEPARTMENT_OTHER): Payer: Self-pay

## 2022-02-24 VITALS — BP 138/70 | HR 105 | Ht 61.0 in | Wt 240.0 lb

## 2022-02-24 VITALS — BP 132/78 | HR 93 | Temp 97.9°F | Resp 18 | Ht 61.0 in | Wt 242.8 lb

## 2022-02-24 DIAGNOSIS — M5432 Sciatica, left side: Secondary | ICD-10-CM | POA: Diagnosis not present

## 2022-02-24 DIAGNOSIS — E1169 Type 2 diabetes mellitus with other specified complication: Secondary | ICD-10-CM | POA: Diagnosis not present

## 2022-02-24 DIAGNOSIS — I2584 Coronary atherosclerosis due to calcified coronary lesion: Secondary | ICD-10-CM | POA: Diagnosis not present

## 2022-02-24 DIAGNOSIS — Z8673 Personal history of transient ischemic attack (TIA), and cerebral infarction without residual deficits: Secondary | ICD-10-CM | POA: Diagnosis not present

## 2022-02-24 DIAGNOSIS — I10 Essential (primary) hypertension: Secondary | ICD-10-CM

## 2022-02-24 DIAGNOSIS — I350 Nonrheumatic aortic (valve) stenosis: Secondary | ICD-10-CM

## 2022-02-24 DIAGNOSIS — R931 Abnormal findings on diagnostic imaging of heart and coronary circulation: Secondary | ICD-10-CM

## 2022-02-24 DIAGNOSIS — I2510 Atherosclerotic heart disease of native coronary artery without angina pectoris: Secondary | ICD-10-CM

## 2022-02-24 MED ORDER — TRULICITY 0.75 MG/0.5ML ~~LOC~~ SOAJ: 0.7500 mg | SUBCUTANEOUS | 1 refills | Status: DC

## 2022-02-24 MED ORDER — PREDNISONE 20 MG PO TABS
ORAL_TABLET | ORAL | 0 refills | Status: DC
  Filled 2022-02-24: qty 9, 6d supply, fill #0

## 2022-02-24 NOTE — Patient Instructions (Signed)
It was good to see you today We will try Trulicity for your diabetes- start at the lowest dose of 0.75 mg weekly For back pain- will use a short course of prednisone for sciatica.  Let me know how this does for you. We may need to get a lumbar spine MRI if your symptoms continue Also ok to follow-up with your spine clinic in [place]  For your heart- I am going to get in touch with cardiology about getting you seen sooner or doing a stress test in the interim If any change in your shortness of breath or occasional chest pain please seek care

## 2022-02-24 NOTE — Patient Instructions (Addendum)
Medication Instructions:  Your physician recommends that you continue on your current medications as directed. Please refer to the Current Medication list given to you today.  *If you need a refill on your cardiac medications before your next appointment, please call your pharmacy*   Lab Work: [facility] recommends that you return for lab work in: when you are fasting You need to have labs done when you are fasting.  You can come Monday through Friday 8:00 - 4:00pm. Lunch 12-1. You do not need to make an appointment as the order has already been placed.    Testing/Procedures: Your physician has requested that you have a carotid duplex. This test is an ultrasound of the carotid arteries in your neck. It looks at blood flow through these arteries that supply the brain with blood. Allow one hour for this exam. There are no restrictions or special instructions.   Your physician has requested that you have a lexiscan myoview. For further information please visit [email or web address]. Please follow instruction sheet, as given.  The test will take approximately 3 to 4 hours to complete; you may bring reading material.  If someone comes with you to your appointment, they will need to remain in the main lobby due to limited space in the testing area.  How to prepare for your Myocardial Perfusion Test: Do not eat or drink 3 hours prior to your test, except you may have water. Do not consume products containing caffeine (regular or decaffeinated) 12 hours prior to your test. (ex: coffee, chocolate, sodas, tea). Do bring a list of your current medications with you.  If not listed below, you may take your medications as normal. Do wear comfortable clothes (no dresses or overalls) and walking shoes, tennis shoes preferred (No heels or open toe shoes are allowed). Do NOT wear cologne, perfume, aftershave, or lotions (deodorant is allowed). If these instructions are not followed, your  test will have to be rescheduled.     Follow-Up: At [facility], you and your health needs are our priority.  As part of our continuing mission to provide you with exceptional heart care, we have created designated Provider Care Teams.  These Care Teams include your primary Cardiologist (physician) and Advanced Practice Providers (APPs -  Physician Assistants and Nurse Practitioners) who all work together to provide you with the care you need, when you need it.  We recommend signing up for the patient portal called "MyChart".  Sign up information is provided on this After Visit Summary.  MyChart is used to connect with patients for Virtual Visits (Telemedicine).  Patients are able to view lab/test results, encounter notes, upcoming appointments, etc.  Non-urgent messages can be sent to your provider as well.   To learn more about what you can do with MyChart, go to [email or web address].    Your next appointment:   2 month(s)  The format for your next appointment:   In Person  Provider:   Jenne Campus, MD    Other Instructions NA

## 2022-02-24 NOTE — Progress Notes (Signed)
Cardiology Office Note:    Date:  02/24/2022   ID:  Jasmin [name], DOB 30-Jul-1945, MRN [id number]  PCP:  Darreld Mclean, MD  Cardiologist:  Jenne Campus, MD    Referring MD: Darreld Mclean, MD   Chief Complaint  Patient presents with   Results    Echo done in November per Dr. Bettina Gavia and Calcium Score is elevated per Dr Lorelei Pont    History of Present Illness:    Jasmin [name] is a 77 y.o. female with past medical history significant for known aortic stenosis, last assessment of the aortic valve made in November of last year which showed mean gradient of 22 mmHg and calculated aortic valve area 1.08, dimensional index was 0.42, diabetes she has for about 10 years, dyslipidemia, she does have remote history of CVA that was detected incidentally by doing MRI in 2017, she was referred back to [name] because she did have a calcium score done her calcium score was 1245 which is 95 percentile for her age sex and race.  She denies have any chest pain tightness squeezing pressure burning chest.  She did have some hip replacement on a while ago on the right side since that time she has some difficulty moving around but still trying to participate in exercises and water aerobic.  There is no tightness squeezing pressure burning chest no passing out no dizziness  Past Medical History:  Diagnosis Date   Acid reflux disease 03/09/2015   Aortic stenosis 03/09/2015   AR (allergic rhinitis) 03/09/2015   Arthritis    Back pain    Controlled type 2 diabetes mellitus without complication, without long-term current use of insulin ([place]) 08/08/2016   Cough    Cystocele with rectocele 08/21/2016   Decreased hearing    Diabetes mellitus without complication (HCC)    Dry mouth    Easy bruising    Essential hypertension 08/08/2016   Heart murmur    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   History of CVA (cerebrovascular accident) 08/08/2016   Seen on MRI from 08/2015   Hyperlipidemia     Hypertension    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   IBS (irritable bowel syndrome) 07/16/2015   Insomnia 07/16/2015   Joint pain    Knee pain    Left ventricular hypertrophy 07/16/2015   Leg cramping    right leg   Mixed hyperlipidemia 08/08/2016   Morbid obesity ([place]) 11/10/2015   Nasal discharge    Nuclear sclerotic cataract of left eye 07/07/2016   OAB (overactive bladder) 01/20/2016   OSA (obstructive sleep apnea) 03/09/2015   Osteoporosis 03/09/2015   Primary osteoarthritis of left knee 03/31/2015   Pulmonary hypertension ([place]) 07/16/2015   Red eyes    Right kidney stone    Shortness of breath on exertion    Skin cancer 11/04/2020   Sleep apnea    Stroke ([place])    Swelling of both lower extremities    UTI (urinary tract infection)    Vitamin D deficiency 03/09/2015    Past Surgical History:  Procedure Laterality Date   ABDOMINAL HYSTERECTOMY  09/14/2016   CATARACT EXTRACTION Left    CATARACT EXTRACTION Left 06/2016   COLPORRHAPHY N/A 09/14/2016   [facility]   CYSTOCELE REPAIR     FRACTURE SURGERY Left 1953   L arm   FRACTURE SURGERY Left 1985   L ankle   HERNIA REPAIR  2004   JOINT REPLACEMENT Right 2012   R TKA   JOINT REPLACEMENT Left 2017  KIDNEY STONE SURGERY     LAPAROSCOPIC ASSISTED VAGINAL HYSTERECTOMY N/A 09/14/2016   [facility]   R foot/ankle Right 2013 and 2014   plate and screws lateral foot and tendon removal medial foot in 2013, revision in 2014   [place]     URETEROSCOPY      Current Medications: Current Meds  Medication Sig   Accu-Chek Softclix Lancets lancets USE AS DIRECTED TO CHECK BLOOD GLUCOSE ONCE DAILY (Patient taking differently: 1 each by Other route See admin instructions. USE AS DIRECTED TO CHECK BLOOD GLUCOSE ONCE DAILY)   acetaminophen (TYLENOL) 650 MG suppository Place 650 mg rectally every 4 (four) hours as needed for mild pain or moderate pain.   albuterol (VENTOLIN HFA) 108 (90 Base) MCG/ACT inhaler Inhale 2  puffs into the lungs every 6 (six) hours as needed for wheezing or shortness of breath.   amoxicillin (AMOXIL) 500 MG capsule Take 500 mg by mouth as directed. Take prior to dentist procedure   Ascorbic Acid (VITAMIN C WITH ROSE HIPS) 500 MG tablet Take 500 mg by mouth daily.   aspirin EC 81 MG tablet Take 81 mg by mouth daily. Swallow whole.   blood glucose meter kit and supplies KIT Dispense based on patient and insurance preference. Use once daily to monitor glucose as needed (FOR ICD-9 250.00, 250.01). (Patient taking differently: Inject 1 each into the skin as directed. Dispense based on patient and insurance preference. Use once daily to monitor glucose as needed (FOR ICD-9 250.00, 250.01).)   CALCIUM PO Take 1 tablet by mouth daily.   Cholecalciferol (VITAMIN D3) 2000 units TABS Take 1 tablet by mouth in the morning. Take one tablet in the AM   clopidogrel (PLAVIX) 75 MG tablet Take 1 tablet (75 mg total) by mouth daily.   clotrimazole-betamethasone (LOTRISONE) cream Apply to plantar foot daily (Patient taking differently: Apply 1 Application topically 2 (two) times daily.)   desmopressin (DDAVP) 0.2 MG tablet Take 1 tablet by mouth nightly (Patient taking differently: Take 0.2 mg by mouth at bedtime.)   desonide (DESOWEN) 0.05 % cream Apply a thin layer to the affected area twice daily for 2 weeks then stop for 1 week. Repeat for flares. (Patient taking differently: Apply 1 Application topically as needed (flare ups).)   Dulaglutide (TRULICITY) 6.38 GY/6.5LD SOPN Inject 0.75 mg into the skin once a week.   econazole nitrate 1 % cream Apply 1 Application topically as needed (skin irritation).   estradiol (ESTRACE) 0.1 MG/GM vaginal cream Apply 0.5 grams vaginally nightly for 2 weeks, then apply 0.5 grams 2 times weekly (Patient taking differently: Place 1 Applicatorful vaginally 2 (two) times a week.)   fluticasone (FLONASE) 50 MCG/ACT nasal spray Place 2 sprays into both nostrils daily.    glucose blood (ACCU-CHEK GUIDE) test strip USE AS DIRECTED TO CHECK BLOOD GLUCOSE ONCE DAILY (Patient taking differently: 1 each by Other route as needed for other (glucose check). USE AS DIRECTED TO CHECK BLOOD GLUCOSE ONCE DAILY)   losartan (COZAAR) 50 MG tablet Take 1 & 1/2 tablets (75 mg total) by mouth daily.   metFORMIN (GLUCOPHAGE) 500 MG tablet Take 1 tablet (500 mg total) by mouth 2 (two) times daily with a meal.   mirabegron ER (MYRBETRIQ) 50 MG TB24 tablet Take 1 tablet (50 mg total) by mouth daily.   omega-3 acid ethyl esters (LOVAZA) 1 g capsule TAKE 2 CAPSULES BY MOUTH TWICE DAILY   omeprazole (PRILOSEC) 40 MG  capsule Take 1 capsule (40 mg total) by mouth daily.   OXYCODONE HCL PO Take 1 tablet by mouth as needed (pain).   Polyethyl Glycol-Propyl Glycol (SYSTANE OP) Apply 1 tablet to eye daily.   predniSONE (DELTASONE) 20 MG tablet Take 2 tablets (40 mg total) by mouth daily for 3 days, and then take 1 tablet (20 mg total) daily for 3 days. (Patient taking differently: Take 20 mg by mouth daily with breakfast. Take 2 tablets (40 mg total) by mouth daily for 3 days, and then take 1 tablet (20 mg total) daily for 3 days.)   PSYLLIUM PO Take 1 tablet by mouth daily.   simvastatin (ZOCOR) 20 MG tablet TAKE 1 TABLET (20 MG TOTAL) BY MOUTH AT BEDTIME. (Patient taking differently: Take 20 mg by mouth at bedtime.)   solifenacin (VESICARE) 10 MG tablet Take 1 tablet (10 mg total) by mouth daily.   Vitamin D, Ergocalciferol, (DRISDOL) 1.25 MG (50000 UNIT) CAPS capsule Take 1 capsule (50,000 Units total) by mouth every 7 (seven) days.     Allergies:   Latex, Ciprofloxacin, Metronidazole, and Adhesive [tape]   Social History   Socioeconomic History   Marital status: Married    Spouse name: Emmani Lesueur   Number of children: 1   Years of education: Not on file   Highest education level: Not on file  Occupational History   Occupation: Retired Pharmacist, hospital  Tobacco Use   Smoking status:  Never    Passive exposure: Never   Smokeless tobacco: Never  Vaping Use   Vaping Use: Never used  Substance and Sexual Activity   Alcohol use: Yes    Alcohol/week: 2.0 - 3.0 standard drinks of alcohol    Types: 2 - 3 Glasses of wine per week    Comment: couple of glasses per week   Drug use: No   Sexual activity: Not Currently  Other Topics Concern   Not on file  Social History Narrative   Not on file   Social Determinants of Health   Financial Resource Strain: Low Risk  (10/19/2020)   Overall Financial Resource Strain (CARDIA)    Difficulty of Paying Living Expenses: Not hard at all  Food Insecurity: No Food Insecurity (12/29/2021)   Hunger Vital Sign    Worried About Running Out of Food in the Last Year: Never true    Ran Out of Food in the Last Year: Never true  Transportation Needs: No Transportation Needs (12/29/2021)   PRAPARE - Hydrologist (Medical): No    Lack of Transportation (Non-Medical): No  Physical Activity: Sufficiently Active (10/19/2020)   Exercise Vital Sign    Days of Exercise per Week: 5 days    Minutes of Exercise per Session: 30 min  Stress: No Stress Concern Present (10/19/2020)   [place]    Feeling of Stress : Not at all  Social Connections: Socially Integrated (10/19/2020)   Social Connection and Isolation Panel [NHANES]    Frequency of Communication with Friends and Family: More than three times a week    Frequency of Social Gatherings with Friends and Family: More than three times a week    Attends Religious Services: More than 4 times per year    Active Member of [employer] or Organizations: Yes    Attends Music therapist: More than 4 times per year    Marital Status: Married     Family History: The patient's  family history includes Heart disease in her father and mother; Hyperlipidemia in her mother; Hypertension in her father and mother;  Stroke in her mother; Thyroid disease in her mother. ROS:   Please see the history of present illness.    All 14 point review of systems negative except as described per history of present illness  EKGs/Labs/Other Studies Reviewed:      Recent Labs: 04/21/2021: ALT 11; Hemoglobin 12.4; Platelets 287.0; TSH 1.83 10/27/2021: BUN 16; Creatinine, Ser 0.57; Potassium 4.6; Sodium 136  Recent Lipid Panel    Component Value Date/Time   CHOL 153 04/21/2021 0924   CHOL 193 06/19/2019 0859   TRIG 148.0 04/21/2021 0924   HDL 49.10 04/21/2021 0924   HDL 52 06/19/2019 0859   CHOLHDL 3 04/21/2021 0924   VLDL 29.6 04/21/2021 0924   LDLCALC 74 04/21/2021 0924   LDLCALC 117 (H) 10/16/2019 1025   LDLDIRECT 122.0 04/20/2020 0946    Physical Exam:    VS:  BP 138/70 (BP Location: Left Arm, Patient Position: Sitting)   Pulse (!) 105   Ht 5' 1" (1.549 m)   Wt 240 lb 0.6 oz (108.9 kg)   SpO2 95%   BMI 45.36 kg/m     Wt Readings from Last 3 Encounters:  02/24/22 240 lb 0.6 oz (108.9 kg)  02/24/22 242 lb 12.8 oz (110.1 kg)  01/17/22 241 lb 6.4 oz (109.5 kg)     GEN:  Well nourished, well developed in no acute distress HEENT: Normal NECK: No JVD; No carotid bruits LYMPHATICS: No lymphadenopathy CARDIAC: RRR, systolic ejection murmur grade 2/6 best heard right upper portion of the sternum, no rubs, no gallops RESPIRATORY:  Clear to auscultation without rales, wheezing or rhonchi  ABDOMEN: Soft, non-tender, non-distended MUSCULOSKELETAL:  No edema; No deformity  SKIN: Warm and dry LOWER EXTREMITIES: no swelling NEUROLOGIC:  Alert and oriented x 3 PSYCHIATRIC:  Normal affect   ASSESSMENT:    1. Elevated coronary artery calcium score   2. Nonrheumatic aortic valve stenosis   3. Essential hypertension   4. History of CVA (cerebrovascular accident)    PLAN:    In order of problems listed above:  Elevated calcium score in this lady with multiple risk factors for coronary artery disease.   I think the best approach is to do Lexiscan to make sure she does not have any obstructive disease.  She does not have any symptomatology that would suggest that but in somebody with diabetes with poor mobility that may be a atypical presentation.  She is already on antiplatelet therapy which I will continue. Nonrheumatic aortic valve stenosis which is moderate.  Continue monitoring. Essential hypertension blood pressure well-controlled continue present management. History of CVA.  Ask her to have arterial duplex evaluation of her carotid arteries. Dyslipidemia I asked her to have fasting lipid profile done.  Last numbers I have is from March of last year with LDL of 74 HDL 49 probably will need to intensify the therapy how much weight with medication will be decided after cholesterol rechecked   Medication Adjustments/Labs and Tests Ordered: Current medicines are reviewed at length with the patient today.  Concerns regarding medicines are outlined above.  No orders of the defined types were placed in this encounter.  Medication changes: No orders of the defined types were placed in this encounter.   Signed, Park Liter, MD, [facility] 02/24/2022 2:50 PM    [facility]

## 2022-02-25 ENCOUNTER — Telehealth (HOSPITAL_COMMUNITY): Payer: Self-pay | Admitting: *Deleted

## 2022-02-25 DIAGNOSIS — R931 Abnormal findings on diagnostic imaging of heart and coronary circulation: Secondary | ICD-10-CM | POA: Diagnosis not present

## 2022-02-25 DIAGNOSIS — I10 Essential (primary) hypertension: Secondary | ICD-10-CM | POA: Diagnosis not present

## 2022-02-25 DIAGNOSIS — I350 Nonrheumatic aortic (valve) stenosis: Secondary | ICD-10-CM | POA: Diagnosis not present

## 2022-02-25 DIAGNOSIS — Z8673 Personal history of transient ischemic attack (TIA), and cerebral infarction without residual deficits: Secondary | ICD-10-CM | POA: Diagnosis not present

## 2022-02-25 NOTE — Telephone Encounter (Signed)
Spoke to pt and gave instructions for MPI study on 03/02/22. Pt made aware of possibility of needing 2 day study and is agreeable.

## 2022-02-26 LAB — LIPID PANEL
Chol/HDL Ratio: 3.2 ratio (ref 0.0–4.4)
Cholesterol, Total: 140 mg/dL (ref 100–199)
HDL: 44 mg/dL (ref >39)
LDL Chol Calc (NIH): 73 mg/dL (ref 0–99)
Triglycerides: 127 mg/dL (ref 0–149)
VLDL Cholesterol Cal: 23 mg/dL (ref 5–40)

## 2022-02-26 LAB — AST: AST: 25 IU/L (ref 0–40)

## 2022-02-26 LAB — ALT: ALT: 13 IU/L (ref 0–32)

## 2022-03-02 ENCOUNTER — Ambulatory Visit (HOSPITAL_COMMUNITY)
Admission: RE | Admit: 2022-03-02 | Discharge: 2022-03-02 | Disposition: A | Discharge status: Home / Self Care | Payer: Medicare PPO | Source: Ambulatory Visit | Attending: Cardiology | Admitting: Cardiology

## 2022-03-02 ENCOUNTER — Ambulatory Visit (HOSPITAL_BASED_OUTPATIENT_CLINIC_OR_DEPARTMENT_OTHER): Payer: Medicare PPO

## 2022-03-02 DIAGNOSIS — I10 Essential (primary) hypertension: Secondary | ICD-10-CM | POA: Insufficient documentation

## 2022-03-02 DIAGNOSIS — Z8673 Personal history of transient ischemic attack (TIA), and cerebral infarction without residual deficits: Secondary | ICD-10-CM | POA: Diagnosis not present

## 2022-03-02 DIAGNOSIS — I350 Nonrheumatic aortic (valve) stenosis: Secondary | ICD-10-CM

## 2022-03-02 DIAGNOSIS — R931 Abnormal findings on diagnostic imaging of heart and coronary circulation: Secondary | ICD-10-CM | POA: Insufficient documentation

## 2022-03-02 MED ORDER — REGADENOSON 0.4 MG/5ML IV SOLN
0.4000 mg | Freq: Once | INTRAVENOUS | Status: AC
  Administered 2022-03-02: 0.4 mg via INTRAVENOUS

## 2022-03-02 MED ORDER — TECHNETIUM TC 99M TETROFOSMIN IV KIT
33.0000 | PACK | Freq: Once | INTRAVENOUS | Status: AC | PRN
  Administered 2022-03-02: 33 via INTRAVENOUS

## 2022-03-03 ENCOUNTER — Ambulatory Visit (HOSPITAL_COMMUNITY): Payer: Medicare PPO | Attending: Internal Medicine

## 2022-03-03 ENCOUNTER — Encounter (HOSPITAL_COMMUNITY): Payer: Medicare PPO

## 2022-03-03 LAB — MYOCARDIAL PERFUSION IMAGING
LV dias vol: 68 mL (ref 46–106)
LV sys vol: 20 mL
Nuc Stress EF: 71 %
Peak HR: 98 {beats}/min
Rest HR: 75 {beats}/min
Rest Nuclear Isotope Dose: 32.5 mCi
SDS: 0
SRS: 0
SSS: 0
ST Depression (mm): 0 mm
Stress Nuclear Isotope Dose: 33 mCi
TID: 0.75

## 2022-03-03 MED ORDER — TECHNETIUM TC 99M TETROFOSMIN IV KIT
32.5000 | PACK | Freq: Once | INTRAVENOUS | Status: AC | PRN
  Administered 2022-03-03: 32.5 via INTRAVENOUS

## 2022-03-04 ENCOUNTER — Other Ambulatory Visit (HOSPITAL_BASED_OUTPATIENT_CLINIC_OR_DEPARTMENT_OTHER): Payer: Self-pay

## 2022-03-04 ENCOUNTER — Other Ambulatory Visit: Payer: Self-pay

## 2022-03-04 MED ORDER — MYRBETRIQ 50 MG PO TB24
50.0000 mg | ORAL_TABLET | Freq: Every day | ORAL | 0 refills | Status: DC
  Filled 2022-03-04: qty 90, 90d supply, fill #0

## 2022-03-07 ENCOUNTER — Other Ambulatory Visit: Payer: Self-pay | Admitting: Family Medicine

## 2022-03-07 ENCOUNTER — Other Ambulatory Visit (HOSPITAL_BASED_OUTPATIENT_CLINIC_OR_DEPARTMENT_OTHER): Payer: Self-pay

## 2022-03-07 DIAGNOSIS — Z8673 Personal history of transient ischemic attack (TIA), and cerebral infarction without residual deficits: Secondary | ICD-10-CM

## 2022-03-07 MED ORDER — CLOPIDOGREL BISULFATE 75 MG PO TABS
75.0000 mg | ORAL_TABLET | Freq: Every day | ORAL | 1 refills | Status: DC
  Filled 2022-03-07: qty 90, 90d supply, fill #0
  Filled 2022-05-30: qty 90, 90d supply, fill #1

## 2022-03-08 ENCOUNTER — Other Ambulatory Visit (HOSPITAL_BASED_OUTPATIENT_CLINIC_OR_DEPARTMENT_OTHER): Payer: Self-pay

## 2022-03-10 ENCOUNTER — Other Ambulatory Visit (HOSPITAL_BASED_OUTPATIENT_CLINIC_OR_DEPARTMENT_OTHER): Payer: Self-pay

## 2022-03-10 DIAGNOSIS — Z85828 Personal history of other malignant neoplasm of skin: Secondary | ICD-10-CM | POA: Diagnosis not present

## 2022-03-10 DIAGNOSIS — L821 Other seborrheic keratosis: Secondary | ICD-10-CM | POA: Diagnosis not present

## 2022-03-10 DIAGNOSIS — D225 Melanocytic nevi of trunk: Secondary | ICD-10-CM | POA: Diagnosis not present

## 2022-03-10 DIAGNOSIS — D485 Neoplasm of uncertain behavior of skin: Secondary | ICD-10-CM | POA: Diagnosis not present

## 2022-03-10 DIAGNOSIS — L304 Erythema intertrigo: Secondary | ICD-10-CM | POA: Diagnosis not present

## 2022-03-10 DIAGNOSIS — D2372 Other benign neoplasm of skin of left lower limb, including hip: Secondary | ICD-10-CM | POA: Diagnosis not present

## 2022-03-10 DIAGNOSIS — D0462 Carcinoma in situ of skin of left upper limb, including shoulder: Secondary | ICD-10-CM | POA: Diagnosis not present

## 2022-03-10 MED ORDER — DESONIDE 0.05 % EX CREA
TOPICAL_CREAM | CUTANEOUS | 0 refills | Status: DC
  Filled 2022-03-10: qty 15, 30d supply, fill #0

## 2022-03-14 ENCOUNTER — Other Ambulatory Visit (HOSPITAL_BASED_OUTPATIENT_CLINIC_OR_DEPARTMENT_OTHER): Payer: Self-pay

## 2022-03-15 ENCOUNTER — Encounter (INDEPENDENT_AMBULATORY_CARE_PROVIDER_SITE_OTHER): Payer: Self-pay | Admitting: Family Medicine

## 2022-03-15 ENCOUNTER — Other Ambulatory Visit (HOSPITAL_BASED_OUTPATIENT_CLINIC_OR_DEPARTMENT_OTHER): Payer: Self-pay

## 2022-03-15 ENCOUNTER — Ambulatory Visit (INDEPENDENT_AMBULATORY_CARE_PROVIDER_SITE_OTHER): Payer: Medicare PPO | Admitting: Family Medicine

## 2022-03-15 VITALS — BP 163/76 | HR 84 | Temp 97.8°F | Ht 61.0 in

## 2022-03-15 DIAGNOSIS — E669 Obesity, unspecified: Secondary | ICD-10-CM

## 2022-03-15 DIAGNOSIS — I10 Essential (primary) hypertension: Secondary | ICD-10-CM | POA: Diagnosis not present

## 2022-03-15 DIAGNOSIS — E559 Vitamin D deficiency, unspecified: Secondary | ICD-10-CM | POA: Diagnosis not present

## 2022-03-15 DIAGNOSIS — Z7985 Long-term (current) use of injectable non-insulin antidiabetic drugs: Secondary | ICD-10-CM | POA: Diagnosis not present

## 2022-03-15 DIAGNOSIS — Z6841 Body Mass Index (BMI) 40.0 and over, adult: Secondary | ICD-10-CM | POA: Diagnosis not present

## 2022-03-15 DIAGNOSIS — E1169 Type 2 diabetes mellitus with other specified complication: Secondary | ICD-10-CM

## 2022-03-15 MED ORDER — VITAMIN D (ERGOCALCIFEROL) 1.25 MG (50000 UNIT) PO CAPS
50000.0000 [IU] | ORAL_CAPSULE | ORAL | 0 refills | Status: DC
  Filled 2022-03-15: qty 4, 28d supply, fill #0

## 2022-03-16 ENCOUNTER — Encounter: Payer: Self-pay | Admitting: Family Medicine

## 2022-03-16 DIAGNOSIS — C44629 Squamous cell carcinoma of skin of left upper limb, including shoulder: Secondary | ICD-10-CM | POA: Insufficient documentation

## 2022-03-16 LAB — VITAMIN D 25 HYDROXY (VIT D DEFICIENCY, FRACTURES): Vit D, 25-Hydroxy: 61.8 ng/mL (ref 30.0–100.0)

## 2022-03-18 ENCOUNTER — Telehealth: Payer: Self-pay

## 2022-03-18 NOTE — Telephone Encounter (Signed)
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

## 2022-03-18 NOTE — Telephone Encounter (Signed)
Patient notified of results.

## 2022-03-26 ENCOUNTER — Other Ambulatory Visit (HOSPITAL_BASED_OUTPATIENT_CLINIC_OR_DEPARTMENT_OTHER): Payer: Self-pay

## 2022-03-28 ENCOUNTER — Other Ambulatory Visit (HOSPITAL_BASED_OUTPATIENT_CLINIC_OR_DEPARTMENT_OTHER): Payer: Self-pay

## 2022-03-29 ENCOUNTER — Other Ambulatory Visit (HOSPITAL_BASED_OUTPATIENT_CLINIC_OR_DEPARTMENT_OTHER): Payer: Self-pay

## 2022-03-29 ENCOUNTER — Ambulatory Visit: Payer: Medicare PPO | Admitting: Cardiology

## 2022-03-30 ENCOUNTER — Other Ambulatory Visit (HOSPITAL_BASED_OUTPATIENT_CLINIC_OR_DEPARTMENT_OTHER): Payer: Self-pay

## 2022-03-30 MED ORDER — SOLIFENACIN SUCCINATE 10 MG PO TABS
10.0000 mg | ORAL_TABLET | Freq: Every day | ORAL | 1 refills | Status: DC
  Filled 2022-03-30: qty 30, 30d supply, fill #0
  Filled 2022-05-02: qty 30, 30d supply, fill #1

## 2022-03-30 NOTE — Progress Notes (Unsigned)
Chief Complaint:   OBESITY Jasmin [name] is here to discuss her progress with her obesity treatment plan along with follow-up of her obesity related diagnoses. Jasmin [name] is on the Category 2 Plan and states she is following her eating plan approximately 40% of the time. Jasmin [name] states she is pool walking and doing cardio for 45 minutes 4-5 times per week.  Today's visit was #: 73 Starting weight: 248 lbs Starting date: 06/21/2017 Today's weight: 240 lbs Today's date: 03/15/2022 Total lbs lost to date: 8 Total lbs lost since last in-office visit: 0  Interim History: Jasmin [name]'s last visit was approximately 4 months ago.  She has been getting back on track with her eating plan and has increased her exercise doing water walking.  She feels she will do best on her category 2 plan.  Subjective:   1. Type 2 diabetes mellitus with other specified complication, without long-term current use of insulin ([place]) Jasmin [name] started Trulicity 2 weeks ago and she denies nausea or vomiting, but notes minimal improvement in polyphagia.  She is working on getting back on track with her eating plan.  2. Essential hypertension Jasmin [name]'s blood pressure is elevated today.  She feels this may be stress related but has a history of frequently elevated blood pressures in the office.  3. Vitamin D deficiency Jasmin [name]'s last vitamin D level was at goal, but this was before winter.  Assessment/Plan:   1. Type 2 diabetes mellitus with other specified complication, without long-term current use of insulin (HCC) Jasmin [name] was educated on how the medication works and that she will get improved control of her polyphagia after 4+ doses.  She will continue with her diet and exercise as discussed to treat diabetes mellitus.  2. Essential hypertension Jasmin [name] will check her blood pressure at home and bring a log in 2 her next visit here or with Dr. Edilia Bo.   3. Vitamin D deficiency We will check labs today, and we will refill  prescription vitamin D for 1 month.  - Vitamin D, Ergocalciferol, (DRISDOL) 1.25 MG (50000 UNIT) CAPS capsule; Take 1 capsule (50,000 Units total) by mouth every 7 (seven) days.  Dispense: 4 capsule; Refill: 0 - VITAMIN D 25 Hydroxy (Vit-D Deficiency, Fractures)  4. BMI 45.0-49.9, adult (HCC)  5. Obesity, Beginning BMI 46.86 Jasmin [name] is currently in the action stage of change. As such, her goal is to continue with weight loss efforts. She has agreed to the Category 2 Plan.   Exercise goals: As is.   Behavioral modification strategies: no skipping meals.  Jasmin [name] has agreed to follow-up with our clinic in 4 weeks. She was informed of the importance of frequent follow-up visits to maximize her success with intensive lifestyle modifications for her multiple health conditions.   Jasmin [name] was informed we would discuss her lab results at her next visit unless there is a critical issue that needs to be addressed sooner. Jasmin [name] agreed to keep her next visit at the agreed upon time to discuss these results.  Objective:   Blood pressure (!) 163/76, pulse 84, temperature 97.8 F (36.6 C), height '5\' 1"'$  (1.549 m), SpO2 97 %. Body mass index is 45.36 kg/m.  General: Cooperative, alert, well developed, in no acute distress. HEENT: Conjunctivae and lids unremarkable. Cardiovascular: Regular rhythm.  Lungs: Normal work of breathing. Neurologic: No focal deficits.   Lab Results  Component Value Date   CREATININE 0.57 10/27/2021   BUN 16 10/27/2021   NA 136 10/27/2021   K 4.6 10/27/2021  CL 99 10/27/2021   CO2 28 10/27/2021   Lab Results  Component Value Date   ALT 13 02/25/2022   AST 25 02/25/2022   ALKPHOS 52 04/21/2021   BILITOT 0.8 04/21/2021   Lab Results  Component Value Date   HGBA1C 6.7 (H) 01/17/2022   HGBA1C 6.9 (H) 10/27/2021   HGBA1C 6.2 04/21/2021   HGBA1C 6.3 10/19/2020   HGBA1C 6.4 04/20/2020   Lab Results  Component Value Date   INSULIN 21.1 07/01/2019   INSULIN  24.2 09/28/2017   INSULIN 17.6 06/21/2017   Lab Results  Component Value Date   TSH 1.83 04/21/2021   Lab Results  Component Value Date   CHOL 140 02/25/2022   HDL 44 02/25/2022   LDLCALC 73 02/25/2022   LDLDIRECT 122.0 04/20/2020   TRIG 127 02/25/2022   CHOLHDL 3.2 02/25/2022   Lab Results  Component Value Date   VD25OH 61.8 03/15/2022   VD25OH 71.2 11/18/2021   VD25OH 53.90 04/21/2021   Lab Results  Component Value Date   WBC 5.9 04/21/2021   HGB 12.4 04/21/2021   HCT 38.7 04/21/2021   MCV 86.2 04/21/2021   PLT 287.0 04/21/2021   No results found for: "IRON", "TIBC", "FERRITIN"  Attestation Statements:   Reviewed by clinician on day of visit: allergies, medications, problem list, medical history, surgical history, family history, social history, and previous encounter notes.   I, Trixie Dredge, am acting as transcriptionist for Dennard Nip, MD.  I have reviewed the above documentation for accuracy and completeness, and I agree with the above. -  Dennard Nip, MD

## 2022-04-03 ENCOUNTER — Other Ambulatory Visit: Payer: Self-pay | Admitting: Family Medicine

## 2022-04-03 DIAGNOSIS — E782 Mixed hyperlipidemia: Secondary | ICD-10-CM

## 2022-04-04 ENCOUNTER — Other Ambulatory Visit: Payer: Self-pay

## 2022-04-04 ENCOUNTER — Other Ambulatory Visit (HOSPITAL_BASED_OUTPATIENT_CLINIC_OR_DEPARTMENT_OTHER): Payer: Self-pay

## 2022-04-04 MED ORDER — SIMVASTATIN 20 MG PO TABS
20.0000 mg | ORAL_TABLET | Freq: Every day | ORAL | 3 refills | Status: DC
  Filled 2022-04-04: qty 90, 90d supply, fill #0
  Filled 2022-06-30: qty 90, 90d supply, fill #1
  Filled 2022-09-29: qty 90, 90d supply, fill #2
  Filled 2022-12-28: qty 90, 90d supply, fill #3

## 2022-04-05 ENCOUNTER — Ambulatory Visit (INDEPENDENT_AMBULATORY_CARE_PROVIDER_SITE_OTHER): Payer: Medicare PPO | Admitting: Family Medicine

## 2022-04-06 ENCOUNTER — Encounter: Payer: Self-pay | Admitting: Family Medicine

## 2022-04-08 NOTE — Progress Notes (Unsigned)
[facility] [address] [place], [place] [address] [phone]  Date:  04/11/2022   Name:  Latoria Lizcano   DOB:  1945/08/31   MRN:  [id number]  PCP:  Darreld Mclean, MD    Chief Complaint: No chief complaint on file.   History of Present Illness:  Jasmin [name] is a 77 y.o. very pleasant female patient who presents with the following:  Patient seen today for 38-monthchronic disease follow-up Most recent visit with myself was in January for sciatica History of well-controlled diabetes, hypertension, hyperlipidemia, stroke, OSA, obesity, pulmonary hypertension, aortic stenosis   She is followed by pulmonology, Dr. ECamillo Flamingfor her pulmonary hypertension and sleep apnea  Last visit we started Trulicity for her diabetes and weight loss  [place] 95th percentile on coronary calcium: Elevated calcium score in this lady with multiple risk factors for coronary artery disease.  I think the best approach is to do Lexiscan to make sure she does not have any obstructive disease.  She does not have any symptomatology that would suggest that but in somebody with diabetes with poor mobility that may be a atypical presentation.  She is already on antiplatelet therapy which I will continue. Nonrheumatic aortic valve stenosis which is moderate.  Continue monitoring. Essential hypertension blood pressure well-controlled continue present management. History of CVA.  Ask her to have arterial duplex evaluation of her carotid arteries. Dyslipidemia I asked her to have fasting lipid profile done.  Last numbers I have is from March of last year with LDL of 74 HDL 49 probably will need to intensify the therapy how much weight with medication will be decided after cholesterol rechecked   Lexi scan also performed in January-low risk Lab Results  Component Value Date   HGBA1C 6.7  (H) 01/17/2022   Recommend COVID booster Eye exam is due Can update A1c if desired   Asa 81 Plavix Ddavp at bedtime Trulicity Losartan [id number] 500 BID Mybetrriq Lovaza Simvastatin vesicare  Patient Active Problem List   Diagnosis Date Noted   SCC (squamous cell carcinoma), arm, left 03/16/2022   Obesity, Beginning BMI 46.86 03/15/2022   Elevated coronary artery calcium score 02/24/2022   Left sided sciatica 07/05/2021   Skin cancer 11/04/2020   BMI 45.0-49.9, adult ([place] 07/31/2020   Swelling of both lower extremities    Shortness of breath on exertion    Red eyes    Knee pain    Joint pain    Decreased hearing    Back pain    Nocturia 12/26/2018   Recurrent UTI 12/26/2018   Vaginal atrophy 12/26/2018   Hyperopia with astigmatism and presbyopia, bilateral 02/09/2018   Long term current use of oral hypoglycemic drug 02/09/2018   PCO (posterior capsular opacification), left 02/09/2018   Pseudophakia of left eye 02/09/2018   PVD (posterior vitreous detachment), both eyes 02/09/2018   Arthritis 12/16/2016   Right kidney stone    Hyperlipidemia    Heart murmur    Diabetes mellitus without complication ([facility]    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   Cystocele with rectocele 08/21/2016   Mixed hyperlipidemia 08/08/2016   Controlled type 2 diabetes mellitus without complication, without long-term current use of insulin ([place] 08/08/2016   Essential hypertension 08/08/2016   History of CVA (cerebrovascular accident) 08/08/2016   Nuclear sclerotic cataract of left eye 07/07/2016   OAB (overactive  bladder) 01/20/2016   Class 3 severe obesity with serious comorbidity and body mass index (BMI) of 45.0 to 49.9 in adult ([place]) 11/10/2015   IBS (irritable bowel syndrome) 07/16/2015   Insomnia 07/16/2015   Left ventricular hypertrophy 07/16/2015   Pulmonary hypertension ([place]) 07/16/2015   Primary osteoarthritis of left knee 03/31/2015   Acid reflux disease  03/09/2015   Aortic stenosis 03/09/2015   AR (allergic rhinitis) 03/09/2015   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   OSA (obstructive sleep apnea) 03/09/2015   Osteoporosis 03/09/2015   Vitamin D deficiency 03/09/2015   Left tibialis posterior tendinitis 12/13/2013    Past Medical History:  Diagnosis Date   Acid reflux disease 03/09/2015   Aortic stenosis 03/09/2015   AR (allergic rhinitis) 03/09/2015   Arthritis    Back pain    Controlled type 2 diabetes mellitus without complication, without long-term current use of insulin ([place]) 08/08/2016   Cough    Cystocele with rectocele 08/21/2016   Decreased hearing    Diabetes mellitus without complication (HCC)    Dry mouth    Easy bruising    Essential hypertension 08/08/2016   Heart murmur    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   History of CVA (cerebrovascular accident) 08/08/2016   Seen on MRI from 08/2015   Hyperlipidemia    Hypertension    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   IBS (irritable bowel syndrome) 07/16/2015   Insomnia 07/16/2015   Joint pain    Knee pain    Left ventricular hypertrophy 07/16/2015   Leg cramping    right leg   Mixed hyperlipidemia 08/08/2016   Morbid obesity ([place]) 11/10/2015   Nasal discharge    Nuclear sclerotic cataract of left eye 07/07/2016   OAB (overactive bladder) 01/20/2016   OSA (obstructive sleep apnea) 03/09/2015   Osteoporosis 03/09/2015   Primary osteoarthritis of left knee 03/31/2015   Pulmonary hypertension ([place]) 07/16/2015   Red eyes    Right kidney stone    Shortness of breath on exertion    Skin cancer 11/04/2020   Sleep apnea    Stroke ([place])    Swelling of both lower extremities    UTI (urinary tract infection)    Vitamin D deficiency 03/09/2015    Past Surgical History:  Procedure Laterality Date   ABDOMINAL HYSTERECTOMY  09/14/2016   CATARACT EXTRACTION Left    CATARACT EXTRACTION Left 06/2016   COLPORRHAPHY N/A 09/14/2016   [facility]   CYSTOCELE REPAIR     FRACTURE  SURGERY Left 1953   L arm   FRACTURE SURGERY Left 1985   L ankle   HERNIA REPAIR  2004   JOINT REPLACEMENT Right 2012   R TKA   JOINT REPLACEMENT Left 2017   KIDNEY STONE SURGERY     LAPAROSCOPIC ASSISTED VAGINAL HYSTERECTOMY N/A 09/14/2016   [facility]   R foot/ankle Right 2013 and 2014   plate and screws lateral foot and tendon removal medial foot in 2013, revision in 2014   [place] [facility]     URETEROSCOPY      Social History   Tobacco Use   Smoking status: Never    Passive exposure: Never   Smokeless tobacco: Never  Vaping Use   Vaping Use: Never used  Substance Use Topics   Alcohol use: Yes    Alcohol/week: 2.0 - 3.0 standard drinks of alcohol    Types: 2 - 3 Glasses of wine per week    Comment: couple of  glasses per week   Drug use: No    Family History  Problem Relation Age of Onset   Hyperlipidemia Mother    Heart disease Mother    Hypertension Mother    Stroke Mother    Thyroid disease Mother    Hypertension Father    Heart disease Father     Allergies  Allergen Reactions   Latex Itching   Ciprofloxacin Hives   Metronidazole Hives   Adhesive [Tape] Rash    Medication list has been reviewed and updated.  Current Outpatient Medications on File Prior to Visit  Medication Sig Dispense Refill   Accu-Chek Softclix Lancets lancets USE AS DIRECTED TO CHECK BLOOD GLUCOSE ONCE DAILY (Patient taking differently: 1 each by Other route See admin instructions. USE AS DIRECTED TO CHECK BLOOD GLUCOSE ONCE DAILY) 100 each 3   acetaminophen (TYLENOL) 650 MG suppository Place 650 mg rectally every 4 (four) hours as needed for mild pain or moderate pain.     albuterol (VENTOLIN HFA) 108 (90 Base) MCG/ACT inhaler Inhale 2 puffs into the lungs every 6 (six) hours as needed for wheezing or shortness of breath. 6.7 g 0   amoxicillin (AMOXIL) 500 MG capsule Take 500 mg by mouth as directed. Take prior to dentist procedure     Ascorbic Acid (VITAMIN  C WITH ROSE HIPS) 500 MG tablet Take 500 mg by mouth daily.     aspirin EC 81 MG tablet Take 81 mg by mouth daily. Swallow whole.     blood glucose meter kit and supplies KIT Dispense based on patient and insurance preference. Use once daily to monitor glucose as needed (FOR ICD-9 250.00, 250.01). (Patient taking differently: Inject 1 each into the skin as directed. Dispense based on patient and insurance preference. Use once daily to monitor glucose as needed (FOR ICD-9 250.00, 250.01).) 1 each 0   CALCIUM PO Take 1 tablet by mouth daily.     Cholecalciferol (VITAMIN D3) 2000 units TABS Take 1 tablet by mouth in the morning. Take one tablet in the AM     clopidogrel (PLAVIX) 75 MG tablet Take 1 tablet (75 mg total) by mouth daily. 90 tablet 1   clotrimazole-betamethasone (LOTRISONE) cream Apply to plantar foot daily (Patient taking differently: Apply 1 Application topically 2 (two) times daily.) 45 g 11   desmopressin (DDAVP) 0.2 MG tablet Take 1 tablet by mouth nightly (Patient taking differently: Take 0.2 mg by mouth at bedtime.) 90 tablet 3   desonide (DESOWEN) 0.05 % cream Apply a thin layer to the affected area twice daily for 2 weeks then stop for 1 week. Repeat for flares. (Patient taking differently: Apply 1 Application topically as needed (flare ups).) 60 g 0   desonide (DESOWEN) 0.05 % cream Apply as directed to affected area. 15 g 0   Dulaglutide (TRULICITY) [id number] 0000000 SOPN Inject 0.75 mg into the skin once a week. 6 mL 1   econazole nitrate 1 % cream Apply 1 Application topically as needed (skin irritation).     estradiol (ESTRACE) 0.1 MG/GM vaginal cream Apply 0.5 grams vaginally nightly for 2 weeks, then apply 0.5 grams 2 times weekly (Patient taking differently: Place 1 Applicatorful vaginally 2 (two) times a week.) 42.5 g 3   fluticasone (FLONASE) 50 MCG/ACT nasal spray Place 2 sprays into both nostrils daily. 48 g 3   glucose blood (ACCU-CHEK GUIDE) test strip USE AS DIRECTED TO  CHECK BLOOD GLUCOSE ONCE DAILY (Patient taking differently: 1 each by Other route  as needed for other (glucose check). USE AS DIRECTED TO CHECK BLOOD GLUCOSE ONCE DAILY) 100 strip 3   losartan (COZAAR) 50 MG tablet Take 1 & 1/2 tablets (75 mg total) by mouth daily. 135 tablet 3   metFORMIN (GLUCOPHAGE) 500 MG tablet Take 1 tablet (500 mg total) by mouth 2 (two) times daily with a meal. 180 tablet 1   mirabegron ER (MYRBETRIQ) 50 MG TB24 tablet Take 1 tablet (50 mg total) by mouth daily. 90 tablet 0   omega-3 acid ethyl esters (LOVAZA) 1 g capsule TAKE 2 CAPSULES BY MOUTH TWICE DAILY 360 capsule 2   omeprazole (PRILOSEC) 40 MG capsule Take 1 capsule (40 mg total) by mouth daily. 30 capsule 11   OXYCODONE HCL PO Take 1 tablet by mouth as needed (pain).     Polyethyl Glycol-Propyl Glycol (SYSTANE OP) Apply 1 tablet to eye daily.     predniSONE (DELTASONE) 20 MG tablet Take 2 tablets (40 mg total) by mouth daily for 3 days, and then take 1 tablet (20 mg total) daily for 3 days. (Patient taking differently: Take 20 mg by mouth daily with breakfast. Take 2 tablets (40 mg total) by mouth daily for 3 days, and then take 1 tablet (20 mg total) daily for 3 days.) 9 tablet 0   PSYLLIUM PO Take 1 tablet by mouth daily.     simvastatin (ZOCOR) 20 MG tablet Take 1 tablet (20 mg total) by mouth at bedtime. 90 tablet 3   solifenacin (VESICARE) 10 MG tablet Take 1 tablet (10 mg total) by mouth daily. 30 tablet 1   Vitamin D, Ergocalciferol, (DRISDOL) 1.25 MG (50000 UNIT) CAPS capsule Take 1 capsule (50,000 Units total) by mouth every 7 (seven) days. 4 capsule 0   No current facility-administered medications on file prior to visit.    Review of Systems:  As per HPI- otherwise negative.   Physical Examination: There were no vitals filed for this visit. There were no vitals filed for this visit. There is no height or weight on file to calculate BMI. Ideal Body Weight:    GEN: no acute distress. HEENT:  Atraumatic, Normocephalic.  Ears and Nose: No external deformity. CV: RRR, No M/G/R. No JVD. No thrill. No extra heart sounds. PULM: CTA B, no wheezes, crackles, rhonchi. No retractions. No resp. distress. No accessory muscle use. ABD: S, NT, ND, +BS. No rebound. No HSM. EXTR: No c/c/e PSYCH: Normally interactive. Conversant.    Assessment and Plan: ***  Signed Lamar Blinks, MD

## 2022-04-08 NOTE — Patient Instructions (Incomplete)
Good to see you again today, assuming all is well please see me in about 6 months Recommend updated COVID booster if due- you are up to date!  Also recommend eye exam- please have your eye doctor send me the report   I will be in touch with your labs Let's increase your Trulicity to  1.5 mg; let me know how this goes for you We can keep going up if needed

## 2022-04-11 ENCOUNTER — Encounter: Payer: Self-pay | Admitting: Family Medicine

## 2022-04-11 ENCOUNTER — Ambulatory Visit: Payer: Medicare PPO | Admitting: Family Medicine

## 2022-04-11 VITALS — BP 120/70 | HR 85 | Resp 19 | Ht 61.0 in | Wt 245.0 lb

## 2022-04-11 DIAGNOSIS — E1169 Type 2 diabetes mellitus with other specified complication: Secondary | ICD-10-CM | POA: Diagnosis not present

## 2022-04-11 DIAGNOSIS — I10 Essential (primary) hypertension: Secondary | ICD-10-CM | POA: Diagnosis not present

## 2022-04-11 DIAGNOSIS — R350 Frequency of micturition: Secondary | ICD-10-CM | POA: Diagnosis not present

## 2022-04-11 DIAGNOSIS — R300 Dysuria: Secondary | ICD-10-CM

## 2022-04-11 DIAGNOSIS — Z6841 Body Mass Index (BMI) 40.0 and over, adult: Secondary | ICD-10-CM | POA: Diagnosis not present

## 2022-04-11 LAB — COMPREHENSIVE METABOLIC PANEL
ALT: 13 U/L (ref 0–35)
AST: 22 U/L (ref 0–37)
Albumin: 4.6 g/dL (ref 3.5–5.2)
Alkaline Phosphatase: 54 U/L (ref 39–117)
BUN: 14 mg/dL (ref 6–23)
CO2: 27 mEq/L (ref 19–32)
Calcium: 9.7 mg/dL (ref 8.4–10.5)
Chloride: 102 mEq/L (ref 96–112)
Creatinine, Ser: 0.6 mg/dL (ref 0.40–1.20)
GFR: 86.77 mL/min (ref >60.00)
Glucose, Bld: 102 mg/dL — ABNORMAL HIGH (ref 70–99)
Potassium: 4.5 mEq/L (ref 3.5–5.1)
Sodium: 138 mEq/L (ref 135–145)
Total Bilirubin: 0.6 mg/dL (ref 0.2–1.2)
Total Protein: 7 g/dL (ref 6.0–8.3)

## 2022-04-11 LAB — URINALYSIS, MICROSCOPIC ONLY

## 2022-04-11 LAB — HEMOGLOBIN A1C: Hgb A1c MFr Bld: 6.8 % — ABNORMAL HIGH (ref 4.6–6.5)

## 2022-04-11 MED ORDER — TRULICITY 1.5 MG/0.5ML ~~LOC~~ SOAJ: 1.5000 mg | SUBCUTANEOUS | 1 refills | Status: DC

## 2022-04-12 ENCOUNTER — Other Ambulatory Visit (HOSPITAL_BASED_OUTPATIENT_CLINIC_OR_DEPARTMENT_OTHER): Payer: Self-pay

## 2022-04-12 MED ORDER — CEPHALEXIN 500 MG PO CAPS
500.0000 mg | ORAL_CAPSULE | Freq: Two times a day (BID) | ORAL | 0 refills | Status: DC
  Filled 2022-04-12: qty 14, 7d supply, fill #0

## 2022-04-14 ENCOUNTER — Other Ambulatory Visit: Payer: Self-pay

## 2022-04-14 ENCOUNTER — Encounter: Payer: Self-pay | Admitting: Family Medicine

## 2022-04-14 ENCOUNTER — Other Ambulatory Visit (HOSPITAL_BASED_OUTPATIENT_CLINIC_OR_DEPARTMENT_OTHER): Payer: Self-pay

## 2022-04-14 DIAGNOSIS — R300 Dysuria: Secondary | ICD-10-CM

## 2022-04-14 LAB — URINE CULTURE
MICRO NUMBER:: 14582951
SPECIMEN QUALITY:: ADEQUATE

## 2022-04-14 MED ORDER — NITROFURANTOIN MONOHYD MACRO 100 MG PO CAPS
100.0000 mg | ORAL_CAPSULE | Freq: Two times a day (BID) | ORAL | 0 refills | Status: DC
  Filled 2022-04-14: qty 14, 7d supply, fill #0

## 2022-04-14 NOTE — Addendum Note (Signed)
Addended by: Lamar Blinks C on: 04/14/2022 10:17 AM   Modules accepted: Orders

## 2022-04-15 DIAGNOSIS — G4733 Obstructive sleep apnea (adult) (pediatric): Secondary | ICD-10-CM | POA: Diagnosis not present

## 2022-04-15 DIAGNOSIS — I272 Pulmonary hypertension, unspecified: Secondary | ICD-10-CM | POA: Diagnosis not present

## 2022-04-15 DIAGNOSIS — R053 Chronic cough: Secondary | ICD-10-CM | POA: Diagnosis not present

## 2022-04-15 DIAGNOSIS — I517 Cardiomegaly: Secondary | ICD-10-CM | POA: Diagnosis not present

## 2022-04-18 DIAGNOSIS — M76822 Posterior tibial tendinitis, left leg: Secondary | ICD-10-CM | POA: Diagnosis not present

## 2022-04-18 DIAGNOSIS — Z7985 Long-term (current) use of injectable non-insulin antidiabetic drugs: Secondary | ICD-10-CM | POA: Diagnosis not present

## 2022-04-18 DIAGNOSIS — M25572 Pain in left ankle and joints of left foot: Secondary | ICD-10-CM | POA: Diagnosis not present

## 2022-04-18 DIAGNOSIS — Z7984 Long term (current) use of oral hypoglycemic drugs: Secondary | ICD-10-CM | POA: Diagnosis not present

## 2022-04-18 DIAGNOSIS — E119 Type 2 diabetes mellitus without complications: Secondary | ICD-10-CM | POA: Diagnosis not present

## 2022-04-18 DIAGNOSIS — M25579 Pain in unspecified ankle and joints of unspecified foot: Secondary | ICD-10-CM | POA: Insufficient documentation

## 2022-04-18 DIAGNOSIS — M79672 Pain in left foot: Secondary | ICD-10-CM | POA: Diagnosis not present

## 2022-04-19 ENCOUNTER — Other Ambulatory Visit (HOSPITAL_BASED_OUTPATIENT_CLINIC_OR_DEPARTMENT_OTHER): Payer: Self-pay

## 2022-04-19 DIAGNOSIS — H524 Presbyopia: Secondary | ICD-10-CM | POA: Diagnosis not present

## 2022-04-19 DIAGNOSIS — Z7984 Long term (current) use of oral hypoglycemic drugs: Secondary | ICD-10-CM | POA: Diagnosis not present

## 2022-04-19 DIAGNOSIS — E119 Type 2 diabetes mellitus without complications: Secondary | ICD-10-CM | POA: Diagnosis not present

## 2022-04-19 DIAGNOSIS — M25572 Pain in left ankle and joints of left foot: Secondary | ICD-10-CM | POA: Diagnosis not present

## 2022-04-19 DIAGNOSIS — H52203 Unspecified astigmatism, bilateral: Secondary | ICD-10-CM | POA: Diagnosis not present

## 2022-04-19 DIAGNOSIS — H16223 Keratoconjunctivitis sicca, not specified as Sjogren's, bilateral: Secondary | ICD-10-CM | POA: Diagnosis not present

## 2022-04-19 DIAGNOSIS — Z961 Presence of intraocular lens: Secondary | ICD-10-CM | POA: Diagnosis not present

## 2022-04-19 DIAGNOSIS — M76822 Posterior tibial tendinitis, left leg: Secondary | ICD-10-CM | POA: Diagnosis not present

## 2022-04-19 DIAGNOSIS — Z7985 Long-term (current) use of injectable non-insulin antidiabetic drugs: Secondary | ICD-10-CM | POA: Diagnosis not present

## 2022-04-19 DIAGNOSIS — H43811 Vitreous degeneration, right eye: Secondary | ICD-10-CM | POA: Diagnosis not present

## 2022-04-19 LAB — HM DIABETES EYE EXAM

## 2022-04-19 MED ORDER — CYCLOSPORINE 0.05 % OP EMUL
1.0000 [drp] | Freq: Two times a day (BID) | OPHTHALMIC | 1 refills | Status: DC
  Filled 2022-04-19: qty 180, 90d supply, fill #0

## 2022-04-27 ENCOUNTER — Encounter: Payer: Self-pay | Admitting: Cardiology

## 2022-04-27 ENCOUNTER — Ambulatory Visit: Payer: Medicare PPO | Attending: Cardiology | Admitting: Cardiology

## 2022-04-27 VITALS — BP 154/78 | HR 88 | Ht 61.0 in | Wt 244.0 lb

## 2022-04-27 DIAGNOSIS — Z6841 Body Mass Index (BMI) 40.0 and over, adult: Secondary | ICD-10-CM

## 2022-04-27 DIAGNOSIS — I10 Essential (primary) hypertension: Secondary | ICD-10-CM

## 2022-04-27 DIAGNOSIS — R931 Abnormal findings on diagnostic imaging of heart and coronary circulation: Secondary | ICD-10-CM

## 2022-04-27 DIAGNOSIS — I272 Pulmonary hypertension, unspecified: Secondary | ICD-10-CM | POA: Diagnosis not present

## 2022-04-27 DIAGNOSIS — I350 Nonrheumatic aortic (valve) stenosis: Secondary | ICD-10-CM

## 2022-04-27 DIAGNOSIS — R0609 Other forms of dyspnea: Secondary | ICD-10-CM | POA: Diagnosis not present

## 2022-04-27 DIAGNOSIS — G4733 Obstructive sleep apnea (adult) (pediatric): Secondary | ICD-10-CM

## 2022-04-27 NOTE — Addendum Note (Signed)
Addended by: Jacobo Forest D on: 04/27/2022 09:12 AM   Modules accepted: Orders

## 2022-04-27 NOTE — Progress Notes (Signed)
Cardiology Office Note:    Date:  04/27/2022   ID:  Jasmin [name], DOB October 03, 1945, MRN [id number]  PCP:  Darreld Mclean, MD  Cardiologist:  Jenne Campus, MD    Referring MD: Darreld Mclean, MD   No chief complaint on file. Doing very well  History of Present Illness:    Jasmin [name] is a 77 y.o. female  with past medical history significant for known aortic stenosis, last assessment of the aortic valve made in November of last year which showed mean gradient of 22 mmHg and calculated aortic valve area 1.08, dimensional index was 0.42, diabetes she has for about 10 years, dyslipidemia, she does have remote history of CVA that was detected incidentally by doing MRI in 2017, she was referred back to [name] because she did have a calcium score done her calcium score was 1245 which is 95 percentile for her age sex and race. She denies have any chest pain tightness squeezing pressure burning ches  Comes today to months for follow-up.  Overall she is doing very well.  Denies have any chest pain tightness squeezing pressure burning chest.  She did have some surgery on her left forearm because of cancer she did not exercise during the recovery time now she is getting ready to go back to her exercises.  There is no chest pain there is no dizziness there is no passing out there is no dyspnea on exertion more than usual  Past Medical History:  Diagnosis Date   Acid reflux disease 03/09/2015   Aortic stenosis 03/09/2015   AR (allergic rhinitis) 03/09/2015   Arthritis    Back pain    Controlled type 2 diabetes mellitus without complication, without long-term current use of insulin ([place]) 08/08/2016   Cough    Cystocele with rectocele 08/21/2016   Decreased hearing    Diabetes mellitus without complication (HCC)    Dry mouth    Easy bruising    Essential hypertension 08/08/2016   Heart murmur    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   History of CVA (cerebrovascular accident)  08/08/2016   Seen on MRI from 08/2015   Hyperlipidemia    Hypertension    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   IBS (irritable bowel syndrome) 07/16/2015   Insomnia 07/16/2015   Joint pain    Knee pain    Left ventricular hypertrophy 07/16/2015   Leg cramping    right leg   Mixed hyperlipidemia 08/08/2016   Morbid obesity ([place]) 11/10/2015   Nasal discharge    Nuclear sclerotic cataract of left eye 07/07/2016   OAB (overactive bladder) 01/20/2016   OSA (obstructive sleep apnea) 03/09/2015   Osteoporosis 03/09/2015   Primary osteoarthritis of left knee 03/31/2015   Pulmonary hypertension ([place]) 07/16/2015   Red eyes    Right kidney stone    Shortness of breath on exertion    Skin cancer 11/04/2020   Sleep apnea    Stroke ([place])    Swelling of both lower extremities    UTI (urinary tract infection)    Vitamin D deficiency 03/09/2015    Past Surgical History:  Procedure Laterality Date   ABDOMINAL HYSTERECTOMY  09/14/2016   CATARACT EXTRACTION Left    CATARACT EXTRACTION Left 06/2016   COLPORRHAPHY N/A 09/14/2016   [facility]   CYSTOCELE REPAIR     FRACTURE SURGERY Left 1953   L arm   FRACTURE SURGERY Left 1985   L ankle   HERNIA REPAIR  2004   JOINT REPLACEMENT  Right 2012   R TKA   JOINT REPLACEMENT Left 2017   KIDNEY STONE SURGERY     LAPAROSCOPIC ASSISTED VAGINAL HYSTERECTOMY N/A 09/14/2016   [facility]   R foot/ankle Right 2013 and 2014   plate and screws lateral foot and tendon removal medial foot in 2013, revision in 2014   [place]     URETEROSCOPY      Current Medications: Current Meds  Medication Sig   Accu-Chek Softclix Lancets lancets USE AS DIRECTED TO CHECK BLOOD GLUCOSE ONCE DAILY (Patient taking differently: 1 each by Other route See admin instructions. USE AS DIRECTED TO CHECK BLOOD GLUCOSE ONCE DAILY)   Acetaminophen (TYLENOL ARTHRITIS PAIN PO) Take 650 mg by mouth every 8 (eight) hours as needed (PAIN).   amoxicillin (AMOXIL) 500 MG  capsule Take 500 mg by mouth as directed. Take prior to dentist procedure   Ascorbic Acid (VITAMIN C WITH ROSE HIPS) 500 MG tablet Take 500 mg by mouth daily.   aspirin EC 81 MG tablet Take 81 mg by mouth daily. Swallow whole.   blood glucose meter kit and supplies KIT Dispense based on patient and insurance preference. Use once daily to monitor glucose as needed (FOR ICD-9 250.00, 250.01). (Patient taking differently: Inject 1 each into the skin as directed. Dispense based on patient and insurance preference. Use once daily to monitor glucose as needed (FOR ICD-9 250.00, 250.01).)   CALCIUM PO Take 1 tablet by mouth daily.   Cholecalciferol (VITAMIN D3) 2000 units TABS Take 1 tablet by mouth in the morning. Take one tablet in the AM   clopidogrel (PLAVIX) 75 MG tablet Take 1 tablet (75 mg total) by mouth daily.   clotrimazole-betamethasone (LOTRISONE) cream Apply to plantar foot daily (Patient taking differently: Apply 1 Application topically 2 (two) times daily.)   cycloSPORINE (RESTASIS) 0.05 % ophthalmic emulsion Place 1 drop into both eyes 2 (two) times daily.   desmopressin (DDAVP) 0.2 MG tablet Take 1 tablet by mouth nightly (Patient taking differently: Take 0.2 mg by mouth at bedtime.)   desonide (DESOWEN) 0.05 % cream Apply as directed to affected area.   Dulaglutide (TRULICITY) 1.5 0000000 SOPN Inject 1.5 mg into the skin once a week.   econazole nitrate 1 % cream Apply 1 Application topically as needed (skin irritation).   fluticasone (FLONASE) 50 MCG/ACT nasal spray Place 2 sprays into both nostrils daily.   glucose blood (ACCU-CHEK GUIDE) test strip USE AS DIRECTED TO CHECK BLOOD GLUCOSE ONCE DAILY (Patient taking differently: 1 each by Other route as needed for other (glucose check). USE AS DIRECTED TO CHECK BLOOD GLUCOSE ONCE DAILY)   losartan (COZAAR) 50 MG tablet Take 1 & 1/2 tablets (75 mg total) by mouth daily.   metFORMIN (GLUCOPHAGE) 500 MG tablet Take 1 tablet (500 mg total) by  mouth 2 (two) times daily with a meal.   mirabegron ER (MYRBETRIQ) 50 MG TB24 tablet Take 1 tablet (50 mg total) by mouth daily.   omega-3 acid ethyl esters (LOVAZA) 1 g capsule TAKE 2 CAPSULES BY MOUTH TWICE DAILY   omeprazole (PRILOSEC) 40 MG capsule Take 1 capsule (40 mg total) by mouth daily.   OXYCODONE HCL PO Take 1 tablet by mouth as needed (pain).   Polyethyl Glycol-Propyl Glycol (SYSTANE OP) Apply 1 tablet to eye daily.   PSYLLIUM PO Take 1 tablet by mouth daily.   simvastatin (ZOCOR) 20 MG tablet Take 1 tablet (20 mg total) by mouth  at bedtime.   solifenacin (VESICARE) 10 MG tablet Take 1 tablet (10 mg total) by mouth daily.   Vitamin D, Ergocalciferol, (DRISDOL) 1.25 MG (50000 UNIT) CAPS capsule Take 1 capsule (50,000 Units total) by mouth every 7 (seven) days.     Allergies:   Latex, Ciprofloxacin, Metronidazole, and Adhesive [tape]   Social History   Socioeconomic History   Marital status: Married    Spouse name: Kelene Mouradian   Number of children: 1   Years of education: Not on file   Highest education level: Not on file  Occupational History   Occupation: Retired Pharmacist, hospital  Tobacco Use   Smoking status: Never    Passive exposure: Never   Smokeless tobacco: Never  Vaping Use   Vaping Use: Never used  Substance and Sexual Activity   Alcohol use: Yes    Alcohol/week: 2.0 - 3.0 standard drinks of alcohol    Types: 2 - 3 Glasses of wine per week    Comment: couple of glasses per week   Drug use: No   Sexual activity: Not Currently  Other Topics Concern   Not on file  Social History Narrative   Not on file   Social Determinants of Health   Financial Resource Strain: Low Risk  (10/19/2020)   Overall Financial Resource Strain (CARDIA)    Difficulty of Paying Living Expenses: Not hard at all  Food Insecurity: No Food Insecurity (12/29/2021)   Hunger Vital Sign    Worried About Running Out of Food in the Last Year: Never true    Ran Out of Food in the Last Year:  Never true  Transportation Needs: No Transportation Needs (12/29/2021)   PRAPARE - Hydrologist (Medical): No    Lack of Transportation (Non-Medical): No  Physical Activity: Sufficiently Active (10/19/2020)   Exercise Vital Sign    Days of Exercise per Week: 5 days    Minutes of Exercise per Session: 30 min  Stress: No Stress Concern Present (10/19/2020)   [place]    Feeling of Stress : Not at all  Social Connections: Socially Integrated (10/19/2020)   Social Connection and Isolation Panel [NHANES]    Frequency of Communication with Friends and Family: More than three times a week    Frequency of Social Gatherings with Friends and Family: More than three times a week    Attends Religious Services: More than 4 times per year    Active Member of [employer] or Organizations: Yes    Attends Music therapist: More than 4 times per year    Marital Status: Married     Family History: The patient's family history includes Heart disease in her father and mother; Hyperlipidemia in her mother; Hypertension in her father and mother; Stroke in her mother; Thyroid disease in her mother. ROS:   Please see the history of present illness.    All 14 point review of systems negative except as described per history of present illness  EKGs/Labs/Other Studies Reviewed:      Recent Labs: 04/11/2022: ALT 13; BUN 14; Creatinine, Ser 0.60; Potassium 4.5; Sodium 138  Recent Lipid Panel    Component Value Date/Time   CHOL 140 02/25/2022 0822   TRIG 127 02/25/2022 0822   HDL 44 02/25/2022 0822   CHOLHDL 3.2 02/25/2022 0822   CHOLHDL 3 04/21/2021 0924   VLDL 29.6 04/21/2021 0924   LDLCALC 73 02/25/2022 0822   LDLCALC 117 (H)  10/16/2019 1025   LDLDIRECT 122.0 04/20/2020 0946    Physical Exam:    VS:  BP (!) 154/78 (BP Location: Right Arm, Patient Position: Sitting, Cuff Size: Large)   Pulse 88    Ht '5\' 1"'$  (1.549 m)   Wt 244 lb (110.7 kg)   SpO2 96%   BMI 46.10 kg/m     Wt Readings from Last 3 Encounters:  04/27/22 244 lb (110.7 kg)  04/11/22 245 lb (111.1 kg)  02/24/22 240 lb 0.6 oz (108.9 kg)     GEN:  Well nourished, well developed in no acute distress HEENT: Normal NECK: No JVD; No carotid bruits LYMPHATICS: No lymphadenopathy CARDIAC: RRR, systolic ejection murmur grade 2/6 best heard right upper portion of the sternum, no rubs, no gallops RESPIRATORY:  Clear to auscultation without rales, wheezing or rhonchi  ABDOMEN: Soft, non-tender, non-distended MUSCULOSKELETAL:  No edema; No deformity  SKIN: Warm and dry LOWER EXTREMITIES: no swelling NEUROLOGIC:  Alert and oriented x 3 PSYCHIATRIC:  Normal affect   ASSESSMENT:    1. Nonrheumatic aortic valve stenosis   2. Elevated coronary artery calcium score   3. Pulmonary hypertension ([place])   4. Essential hypertension   5. OSA (obstructive sleep apnea)   6. BMI 45.0-49.9, adult [facility])    PLAN:    In order of problems listed above:  Nonrheumatic aortic valve stenosis not critical.  She will have another echocardiogram done in May.  I warned her about 3 signs and symptoms that could indicate worsening of the problem. Elevated calcium score: Stress test negative continue risk factors modifications which include antiplatelets therapy as well as cholesterol management. Dyslipidemia I did review K PN which show me data from 25 February 2022 with LDL of 73 HDL 44.  Will continue present management which includes simvastatin 20 Obstructive sleep apnea that being followed by pulmonary   Medication Adjustments/Labs and Tests Ordered: Current medicines are reviewed at length with the patient today.  Concerns regarding medicines are outlined above.  No orders of the defined types were placed in this encounter.  Medication changes: No orders of the defined types were placed in this encounter.   Signed, Park Liter,  MD, [facility] 04/27/2022 9:01 AM    [place]

## 2022-04-27 NOTE — Patient Instructions (Signed)

## 2022-04-28 ENCOUNTER — Encounter: Payer: Self-pay | Admitting: Family Medicine

## 2022-04-28 ENCOUNTER — Ambulatory Visit: Payer: Medicare PPO | Admitting: Family Medicine

## 2022-04-28 ENCOUNTER — Other Ambulatory Visit (INDEPENDENT_AMBULATORY_CARE_PROVIDER_SITE_OTHER): Payer: Medicare PPO

## 2022-04-28 ENCOUNTER — Ambulatory Visit (INDEPENDENT_AMBULATORY_CARE_PROVIDER_SITE_OTHER): Payer: Medicare PPO | Admitting: Family Medicine

## 2022-04-28 DIAGNOSIS — R300 Dysuria: Secondary | ICD-10-CM

## 2022-04-28 LAB — URINALYSIS, MICROSCOPIC ONLY

## 2022-04-28 LAB — POCT URINALYSIS DIP (MANUAL ENTRY)
Blood, UA: NEGATIVE
Glucose, UA: NEGATIVE mg/dL
Nitrite, UA: POSITIVE — AB
Protein Ur, POC: NEGATIVE mg/dL
Spec Grav, UA: <=1.005 — AB (ref 1.010–1.025)
Urobilinogen, UA: 0.2 E.U./dL
pH, UA: 7 (ref 5.0–8.0)

## 2022-04-28 MED ORDER — NITROFURANTOIN MONOHYD MACRO 100 MG PO CAPS
100.0000 mg | ORAL_CAPSULE | Freq: Two times a day (BID) | ORAL | 0 refills | Status: DC
  Filled 2022-04-28: qty 14, 7d supply, fill #0

## 2022-04-28 NOTE — Telephone Encounter (Signed)
Received her urinalysis which does suggest UTI again  Will repeat dose of macrobid Message to pt

## 2022-04-28 NOTE — Addendum Note (Signed)
Addended by: Lamar Blinks C on: 04/28/2022 07:58 PM   Modules accepted: Orders

## 2022-04-29 ENCOUNTER — Other Ambulatory Visit (HOSPITAL_BASED_OUTPATIENT_CLINIC_OR_DEPARTMENT_OTHER): Payer: Self-pay

## 2022-05-01 ENCOUNTER — Other Ambulatory Visit (HOSPITAL_BASED_OUTPATIENT_CLINIC_OR_DEPARTMENT_OTHER): Payer: Self-pay

## 2022-05-01 LAB — URINE CULTURE
MICRO NUMBER:: 14662517
SPECIMEN QUALITY:: ADEQUATE

## 2022-05-01 MED ORDER — CEPHALEXIN 500 MG PO CAPS
500.0000 mg | ORAL_CAPSULE | Freq: Two times a day (BID) | ORAL | 0 refills | Status: DC
  Filled 2022-05-01: qty 14, 7d supply, fill #0

## 2022-05-01 NOTE — Addendum Note (Signed)
Addended by: Lamar Blinks C on: 05/01/2022 05:31 PM   Modules accepted: Orders

## 2022-05-02 ENCOUNTER — Other Ambulatory Visit (HOSPITAL_BASED_OUTPATIENT_CLINIC_OR_DEPARTMENT_OTHER): Payer: Self-pay

## 2022-05-02 ENCOUNTER — Other Ambulatory Visit (HOSPITAL_BASED_OUTPATIENT_CLINIC_OR_DEPARTMENT_OTHER): Payer: Self-pay | Admitting: Family Medicine

## 2022-05-02 ENCOUNTER — Other Ambulatory Visit: Payer: Self-pay

## 2022-05-02 ENCOUNTER — Other Ambulatory Visit (HOSPITAL_COMMUNITY): Payer: Self-pay

## 2022-05-02 DIAGNOSIS — Z1231 Encounter for screening mammogram for malignant neoplasm of breast: Secondary | ICD-10-CM

## 2022-05-02 MED ORDER — DESONIDE 0.05 % EX CREA
TOPICAL_CREAM | CUTANEOUS | 0 refills | Status: DC
  Filled 2022-05-02: qty 15, 30d supply, fill #0

## 2022-05-09 ENCOUNTER — Other Ambulatory Visit (HOSPITAL_BASED_OUTPATIENT_CLINIC_OR_DEPARTMENT_OTHER): Payer: Self-pay

## 2022-05-09 ENCOUNTER — Other Ambulatory Visit: Payer: Self-pay

## 2022-05-09 ENCOUNTER — Other Ambulatory Visit: Payer: Self-pay | Admitting: Family Medicine

## 2022-05-09 ENCOUNTER — Telehealth: Payer: Self-pay | Admitting: Family Medicine

## 2022-05-09 DIAGNOSIS — J3089 Other allergic rhinitis: Secondary | ICD-10-CM

## 2022-05-09 MED ORDER — FLUTICASONE PROPIONATE 50 MCG/ACT NA SUSP
2.0000 | Freq: Every day | NASAL | 3 refills | Status: DC
  Filled 2022-05-09: qty 48, 90d supply, fill #0
  Filled 2022-08-02: qty 48, 90d supply, fill #1
  Filled 2022-10-31: qty 48, 90d supply, fill #2
  Filled 2023-01-29: qty 48, 90d supply, fill #3

## 2022-05-09 NOTE — Telephone Encounter (Signed)
Prescription Request  05/09/2022  Is this a "Controlled Substance" medicine? No  LOV: 04/11/2022  What is the name of the medication or equipment?  fluticasone (FLONASE) 50 MCG/ACT nasal spray   Have you contacted your pharmacy to request a refill? No   Which pharmacy would you like this sent to?  [facility] [address], [place] [address] Phone: [phone] Fax: [phone]    Patient notified that their request is being sent to the clinical staff for review and that they should receive a response within 2 business days.   Please advise at [facility] [phone]

## 2022-05-09 NOTE — Telephone Encounter (Signed)
Refill has been sent in.  

## 2022-05-17 DIAGNOSIS — M25572 Pain in left ankle and joints of left foot: Secondary | ICD-10-CM | POA: Diagnosis not present

## 2022-05-17 DIAGNOSIS — M76822 Posterior tibial tendinitis, left leg: Secondary | ICD-10-CM | POA: Diagnosis not present

## 2022-05-19 NOTE — Patient Instructions (Addendum)
Great to see you again today- take care Please schedule your bone density We can do labs at our visit in August

## 2022-05-19 NOTE — Progress Notes (Signed)
[place] at [facility], [place], [place] [address] [id number] [phone]  Date:  05/23/2022   Name:  Jasmin [name]   DOB:  Jun 28, 1945   MRN:  [id number]  PCP:  Darreld Mclean, MD    Chief Complaint: Annual Exam (Concerns/ questions: none/Foot exam is due/)   History of Present Illness:  Jasmin [name] is a 77 y.o. very pleasant female patient who presents with the following:  Pt seen today for a CPE Last seen by myself in February of this year  History of well-controlled diabetes, hypertension, hyperlipidemia, stroke, OSA, obesity, pulmonary hypertension, aortic stenosis   She is followed by pulmonology, Dr. Camillo Flaming for her pulmonary hypertension and sleep apnea Her cardiologist is Dr Raliegh Ip- he is now following due to coronary calcium in 95th percentile   I increased her trulicity dose in February ; not SE noted   Foot exam is due Too soon to recheck A1c Needs a CBC but can do at next labs if she prefers Mammo is UTD- schedule for next month Can update dexa; will order   She is working on her weight with diet and exercise, she is also using trulicity   Wt Readings from Last 3 Encounters:  05/23/22 243 lb 3.2 oz (110.3 kg)  04/27/22 244 lb (110.7 kg)  04/11/22 245 lb (111.1 kg)    Asa Plavix Trulicity  DDAVP Estrogen cream  Losartan Metformin Myrbetriq Lovaza Simvastatin   Wt Readings from Last 3 Encounters:  05/23/22 243 lb 3.2 oz (110.3 kg)  04/27/22 244 lb (110.7 kg)  04/11/22 245 lb (111.1 kg)   Lab Results  Component Value Date   HGBA1C 6.8 (H) 04/11/2022    Patient Active Problem List   Diagnosis Date Noted   SCC (squamous cell carcinoma), arm, left 03/16/2022   Obesity, Beginning BMI 46.86 03/15/2022   Elevated coronary artery calcium score 02/24/2022   Left sided sciatica 07/05/2021   Skin cancer 11/04/2020   BMI 45.0-49.9, adult 07/31/2020   Swelling of both lower extremities     Shortness of breath on exertion    Red eyes    Knee pain    Joint pain    Decreased hearing    Back pain    Nocturia 12/26/2018   Recurrent UTI 12/26/2018   Vaginal atrophy 12/26/2018   Hyperopia with astigmatism and presbyopia, bilateral 02/09/2018   Long term current use of oral hypoglycemic drug 02/09/2018   PCO (posterior capsular opacification), left 02/09/2018   Pseudophakia of left eye 02/09/2018   PVD (posterior vitreous detachment), both eyes 02/09/2018   Arthritis 12/16/2016   Right kidney stone    Hyperlipidemia    Heart murmur    Diabetes mellitus without complication    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   Cystocele with rectocele 08/21/2016   Mixed hyperlipidemia 08/08/2016   Controlled type 2 diabetes mellitus without complication, without long-term current use of insulin 08/08/2016   Essential hypertension 08/08/2016   History of CVA (cerebrovascular accident) 08/08/2016   Nuclear sclerotic cataract of left eye 07/07/2016   OAB (overactive bladder) 01/20/2016   Class 3 severe obesity with serious comorbidity and body mass index (BMI) of 45.0 to 49.9 in adult 11/10/2015   IBS (irritable bowel syndrome) 07/16/2015   Insomnia 07/16/2015   Left ventricular hypertrophy 07/16/2015   Pulmonary hypertension 07/16/2015   Primary osteoarthritis of left knee 03/31/2015   Acid reflux disease 03/09/2015   Aortic stenosis  03/09/2015   AR (allergic rhinitis) 03/09/2015   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   OSA (obstructive sleep apnea) 03/09/2015   Osteoporosis 03/09/2015   Vitamin D deficiency 03/09/2015   Left tibialis posterior tendinitis 12/13/2013    Past Medical History:  Diagnosis Date   Acid reflux disease 03/09/2015   Aortic stenosis 03/09/2015   AR (allergic rhinitis) 03/09/2015   Arthritis    Back pain    Controlled type 2 diabetes mellitus without complication, without long-term current use of insulin ([place]) 08/08/2016   Cough    Cystocele  with rectocele 08/21/2016   Decreased hearing    Diabetes mellitus without complication (HCC)    Dry mouth    Easy bruising    Essential hypertension 08/08/2016   Heart murmur    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   History of CVA (cerebrovascular accident) 08/08/2016   Seen on MRI from 08/2015   Hyperlipidemia    Hypertension    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   IBS (irritable bowel syndrome) 07/16/2015   Insomnia 07/16/2015   Joint pain    Knee pain    Left ventricular hypertrophy 07/16/2015   Leg cramping    right leg   Mixed hyperlipidemia 08/08/2016   Morbid obesity ([place]) 11/10/2015   Nasal discharge    Nuclear sclerotic cataract of left eye 07/07/2016   OAB (overactive bladder) 01/20/2016   OSA (obstructive sleep apnea) 03/09/2015   Osteoporosis 03/09/2015   Primary osteoarthritis of left knee 03/31/2015   Pulmonary hypertension ([place]) 07/16/2015   Red eyes    Right kidney stone    Shortness of breath on exertion    Skin cancer 11/04/2020   Sleep apnea    Stroke ([place])    Swelling of both lower extremities    UTI (urinary tract infection)    Vitamin D deficiency 03/09/2015    Past Surgical History:  Procedure Laterality Date   ABDOMINAL HYSTERECTOMY  09/14/2016   CATARACT EXTRACTION Left    CATARACT EXTRACTION Left 06/2016   COLPORRHAPHY N/A 09/14/2016   [facility]   CYSTOCELE REPAIR     FRACTURE SURGERY Left 1953   L arm   FRACTURE SURGERY Left 1985   L ankle   HERNIA REPAIR  2004   JOINT REPLACEMENT Right 2012   R TKA   JOINT REPLACEMENT Left 2017   KIDNEY STONE SURGERY     LAPAROSCOPIC ASSISTED VAGINAL HYSTERECTOMY N/A 09/14/2016   [facility]   R foot/ankle Right 2013 and 2014   plate and screws lateral foot and tendon removal medial foot in 2013, revision in 2014   [place]     URETEROSCOPY      Social History   Tobacco Use   Smoking status: Never    Passive exposure: Never   Smokeless tobacco: Never  Vaping Use    Vaping Use: Never used  Substance Use Topics   Alcohol use: Yes    Alcohol/week: 2.0 - 3.0 standard drinks of alcohol    Types: 2 - 3 Glasses of wine per week    Comment: couple of glasses per week   Drug use: No    Family History  Problem Relation Age of Onset   Hyperlipidemia Mother    Heart disease Mother    Hypertension Mother    Stroke Mother    Thyroid disease Mother    Hypertension Father    Heart disease Father     Allergies  Allergen Reactions  Latex Itching   Ciprofloxacin Hives   Metronidazole Hives   Adhesive [Tape] Rash    Medication list has been reviewed and updated.  Current Outpatient Medications on File Prior to Visit  Medication Sig Dispense Refill   Accu-Chek Softclix Lancets lancets USE AS DIRECTED TO CHECK BLOOD GLUCOSE ONCE DAILY (Patient taking differently: 1 each by Other route See admin instructions. USE AS DIRECTED TO CHECK BLOOD GLUCOSE ONCE DAILY) 100 each 3   Acetaminophen (TYLENOL ARTHRITIS PAIN PO) Take 650 mg by mouth every 8 (eight) hours as needed (PAIN).     Ascorbic Acid (VITAMIN C WITH ROSE HIPS) 500 MG tablet Take 500 mg by mouth daily.     aspirin EC 81 MG tablet Take 81 mg by mouth daily. Swallow whole.     blood glucose meter kit and supplies KIT Dispense based on patient and insurance preference. Use once daily to monitor glucose as needed (FOR ICD-9 250.00, 250.01). (Patient taking differently: Inject 1 each into the skin as directed. Dispense based on patient and insurance preference. Use once daily to monitor glucose as needed (FOR ICD-9 250.00, 250.01).) 1 each 0   CALCIUM PO Take 1 tablet by mouth daily.     Cholecalciferol (VITAMIN D3) 2000 units TABS Take 1 tablet by mouth in the morning. Take one tablet in the AM     clopidogrel (PLAVIX) 75 MG tablet Take 1 tablet (75 mg total) by mouth daily. 90 tablet 1   clotrimazole-betamethasone (LOTRISONE) cream Apply to plantar foot daily (Patient taking differently: Apply 1  Application topically 2 (two) times daily.) 45 g 11   cycloSPORINE (RESTASIS) 0.05 % ophthalmic emulsion Place 1 drop into both eyes 2 (two) times daily. 180 each 1   desmopressin (DDAVP) 0.2 MG tablet Take 1 tablet by mouth nightly (Patient taking differently: Take 0.2 mg by mouth at bedtime.) 90 tablet 3   desonide (DESOWEN) 0.05 % cream Apply as directed to affected area. 15 g 0   Dulaglutide (TRULICITY) 1.5 0000000 SOPN Inject 1.5 mg into the skin once a week. 6 mL 1   econazole nitrate 1 % cream Apply 1 Application topically as needed (skin irritation).     estradiol (ESTRACE) 0.1 MG/GM vaginal cream Apply 0.5 grams vaginally nightly for 2 weeks, then apply 0.5 grams 2 times weekly 42.5 g 3   fluticasone (FLONASE) 50 MCG/ACT nasal spray Place 2 sprays into both nostrils daily. 48 g 3   glucose blood (ACCU-CHEK GUIDE) test strip USE AS DIRECTED TO CHECK BLOOD GLUCOSE ONCE DAILY (Patient taking differently: 1 each by Other route as needed for other (glucose check). USE AS DIRECTED TO CHECK BLOOD GLUCOSE ONCE DAILY) 100 strip 3   omeprazole (PRILOSEC) 40 MG capsule Take 1 capsule (40 mg total) by mouth daily. 30 capsule 11   OXYCODONE HCL PO Take 1 tablet by mouth as needed (pain).     Polyethyl Glycol-Propyl Glycol (SYSTANE OP) Apply 1 tablet to eye daily.     PSYLLIUM PO Take 1 tablet by mouth daily.     simvastatin (ZOCOR) 20 MG tablet Take 1 tablet (20 mg total) by mouth at bedtime. 90 tablet 3   solifenacin (VESICARE) 10 MG tablet Take 1 tablet (10 mg total) by mouth daily. 30 tablet 1   Vitamin D, Ergocalciferol, (DRISDOL) 1.25 MG (50000 UNIT) CAPS capsule Take 1 capsule (50,000 Units total) by mouth every 7 (seven) days. 4 capsule 0   No current facility-administered medications on file prior to visit.  Review of Systems:  As per HPI- otherwise negative.   Physical Examination: Vitals:   05/23/22 0818  BP: 122/72  Pulse: 83  Resp: 18  Temp: 97.9 F (36.6 C)  SpO2: 96%    Vitals:   05/23/22 0818  Weight: 243 lb 3.2 oz (110.3 kg)  Height: 5\' 1"  (1.549 m)   Body mass index is 45.95 kg/m. Ideal Body Weight: Weight in (lb) to have BMI = 25: 132  GEN: no acute distress. Obese, looks well  HEENT: Atraumatic, Normocephalic.  Bilateral TM wnl, oropharynx normal.  PEERL,EOMI.   Ears and Nose: No external deformity. CV: RRR, No M/G/R. No JVD. No thrill. No extra heart sounds. PULM: CTA B, no wheezes, crackles, rhonchi. No retractions. No resp. distress. No accessory muscle use. ABD: S, NT, ND. No rebound. No HSM. EXTR: No c/c/e PSYCH: Normally interactive. Conversant.  Foot exam normal   Assessment and Plan: Physical exam  Estrogen deficiency - Plan: DG Bone Density  Essential hypertension - Plan: losartan (COZAAR) 50 MG tablet  Controlled type 2 diabetes mellitus without complication, without long-term current use of insulin - Plan: metFORMIN (GLUCOPHAGE) 500 MG tablet  Mixed hyperlipidemia - Plan: omega-3 acid ethyl esters (LOVAZA) 1 g capsule  Physical exam today- encouraged healthy diet and exercise routine  IUTD Foot exam completed She is keeping up with her pulmonary, cardiology eval BP under good control She will see me in August and do labs then  Signed Lamar Blinks, MD

## 2022-05-23 ENCOUNTER — Ambulatory Visit (INDEPENDENT_AMBULATORY_CARE_PROVIDER_SITE_OTHER): Payer: Medicare PPO | Admitting: Family Medicine

## 2022-05-23 ENCOUNTER — Other Ambulatory Visit (HOSPITAL_BASED_OUTPATIENT_CLINIC_OR_DEPARTMENT_OTHER): Payer: Self-pay

## 2022-05-23 ENCOUNTER — Ambulatory Visit (HOSPITAL_BASED_OUTPATIENT_CLINIC_OR_DEPARTMENT_OTHER)
Admission: RE | Admit: 2022-05-23 | Discharge: 2022-05-23 | Disposition: A | Discharge status: Home / Self Care | Payer: Medicare PPO | Source: Ambulatory Visit | Attending: Family Medicine | Admitting: Family Medicine

## 2022-05-23 VITALS — BP 122/72 | HR 83 | Temp 97.9°F | Resp 18 | Ht 61.0 in | Wt 243.2 lb

## 2022-05-23 DIAGNOSIS — I10 Essential (primary) hypertension: Secondary | ICD-10-CM

## 2022-05-23 DIAGNOSIS — E782 Mixed hyperlipidemia: Secondary | ICD-10-CM | POA: Diagnosis not present

## 2022-05-23 DIAGNOSIS — E2839 Other primary ovarian failure: Secondary | ICD-10-CM | POA: Diagnosis not present

## 2022-05-23 DIAGNOSIS — E119 Type 2 diabetes mellitus without complications: Secondary | ICD-10-CM

## 2022-05-23 DIAGNOSIS — Z0000 Encounter for general adult medical examination without abnormal findings: Secondary | ICD-10-CM | POA: Diagnosis not present

## 2022-05-23 DIAGNOSIS — M85831 Other specified disorders of bone density and structure, right forearm: Secondary | ICD-10-CM | POA: Diagnosis not present

## 2022-05-23 DIAGNOSIS — Z780 Asymptomatic menopausal state: Secondary | ICD-10-CM | POA: Diagnosis not present

## 2022-05-23 MED ORDER — METFORMIN HCL 500 MG PO TABS
500.0000 mg | ORAL_TABLET | Freq: Two times a day (BID) | ORAL | 3 refills | Status: DC
  Filled 2022-05-23 – 2022-08-14 (×2): qty 180, 90d supply, fill #0
  Filled 2022-11-09: qty 180, 90d supply, fill #1
  Filled 2023-02-13: qty 180, 90d supply, fill #2
  Filled 2023-05-09: qty 180, 90d supply, fill #3

## 2022-05-23 MED ORDER — OMEGA-3-ACID ETHYL ESTERS 1 G PO CAPS
2.0000 g | ORAL_CAPSULE | Freq: Two times a day (BID) | ORAL | 3 refills | Status: DC
  Filled 2022-05-23: qty 360, 90d supply, fill #0
  Filled 2022-08-22: qty 360, 90d supply, fill #1
  Filled 2022-11-19: qty 360, 90d supply, fill #2
  Filled 2023-02-13: qty 360, 90d supply, fill #3

## 2022-05-23 MED ORDER — LOSARTAN POTASSIUM 50 MG PO TABS
75.0000 mg | ORAL_TABLET | Freq: Every day | ORAL | 3 refills | Status: DC
  Filled 2022-05-23 – 2022-08-01 (×3): qty 135, 90d supply, fill #0
  Filled 2022-10-31: qty 135, 90d supply, fill #1
  Filled 2023-01-29: qty 135, 90d supply, fill #2
  Filled 2023-04-26: qty 135, 90d supply, fill #3

## 2022-05-23 MED ORDER — MIRABEGRON ER 50 MG PO TB24
50.0000 mg | ORAL_TABLET | Freq: Every day | ORAL | 3 refills | Status: DC
  Filled 2022-05-23: qty 90, 90d supply, fill #0
  Filled 2022-08-29: qty 90, 90d supply, fill #1
  Filled 2022-11-28: qty 90, 90d supply, fill #2
  Filled 2023-02-13: qty 90, 90d supply, fill #3

## 2022-05-24 ENCOUNTER — Ambulatory Visit (INDEPENDENT_AMBULATORY_CARE_PROVIDER_SITE_OTHER): Payer: Medicare PPO | Admitting: Family Medicine

## 2022-05-25 ENCOUNTER — Encounter: Payer: Self-pay | Admitting: Family Medicine

## 2022-06-01 ENCOUNTER — Other Ambulatory Visit (HOSPITAL_BASED_OUTPATIENT_CLINIC_OR_DEPARTMENT_OTHER): Payer: Self-pay

## 2022-06-01 DIAGNOSIS — G4733 Obstructive sleep apnea (adult) (pediatric): Secondary | ICD-10-CM | POA: Diagnosis not present

## 2022-06-02 ENCOUNTER — Other Ambulatory Visit (HOSPITAL_BASED_OUTPATIENT_CLINIC_OR_DEPARTMENT_OTHER): Payer: Self-pay

## 2022-06-02 ENCOUNTER — Other Ambulatory Visit: Payer: Self-pay

## 2022-06-02 ENCOUNTER — Other Ambulatory Visit: Payer: Self-pay | Admitting: Family Medicine

## 2022-06-02 MED ORDER — ASPIRIN 81 MG PO TBEC
81.0000 mg | DELAYED_RELEASE_TABLET | Freq: Every day | ORAL | 3 refills | Status: DC
  Filled 2022-06-02: qty 120, 120d supply, fill #0

## 2022-06-02 MED ORDER — SOLIFENACIN SUCCINATE 10 MG PO TABS
10.0000 mg | ORAL_TABLET | Freq: Every day | ORAL | 1 refills | Status: DC
  Filled 2022-06-02: qty 30, 30d supply, fill #0
  Filled 2022-06-30: qty 30, 30d supply, fill #1

## 2022-06-06 ENCOUNTER — Ambulatory Visit (HOSPITAL_BASED_OUTPATIENT_CLINIC_OR_DEPARTMENT_OTHER)
Admission: RE | Admit: 2022-06-06 | Discharge: 2022-06-06 | Disposition: A | Discharge status: Home / Self Care | Payer: Medicare PPO | Source: Ambulatory Visit | Attending: Family Medicine | Admitting: Family Medicine

## 2022-06-06 ENCOUNTER — Encounter (HOSPITAL_BASED_OUTPATIENT_CLINIC_OR_DEPARTMENT_OTHER): Payer: Self-pay

## 2022-06-06 DIAGNOSIS — Z1231 Encounter for screening mammogram for malignant neoplasm of breast: Secondary | ICD-10-CM | POA: Diagnosis not present

## 2022-06-08 ENCOUNTER — Other Ambulatory Visit (HOSPITAL_BASED_OUTPATIENT_CLINIC_OR_DEPARTMENT_OTHER): Payer: Self-pay

## 2022-06-08 ENCOUNTER — Encounter: Payer: Self-pay | Admitting: Family Medicine

## 2022-06-08 DIAGNOSIS — E1169 Type 2 diabetes mellitus with other specified complication: Secondary | ICD-10-CM

## 2022-06-08 DIAGNOSIS — Z6841 Body Mass Index (BMI) 40.0 and over, adult: Secondary | ICD-10-CM

## 2022-06-08 MED ORDER — DESONIDE 0.05 % EX CREA
TOPICAL_CREAM | CUTANEOUS | 0 refills | Status: DC
  Filled 2022-06-08: qty 15, 14d supply, fill #0

## 2022-06-17 DIAGNOSIS — G4733 Obstructive sleep apnea (adult) (pediatric): Secondary | ICD-10-CM | POA: Diagnosis not present

## 2022-06-21 ENCOUNTER — Ambulatory Visit (INDEPENDENT_AMBULATORY_CARE_PROVIDER_SITE_OTHER): Payer: Medicare PPO | Admitting: Family Medicine

## 2022-06-21 NOTE — Telephone Encounter (Signed)
Looks like this was last tested in November.

## 2022-07-01 ENCOUNTER — Other Ambulatory Visit: Payer: Self-pay

## 2022-07-11 ENCOUNTER — Other Ambulatory Visit (HOSPITAL_BASED_OUTPATIENT_CLINIC_OR_DEPARTMENT_OTHER): Payer: Self-pay

## 2022-07-11 MED ORDER — TRULICITY 1.5 MG/0.5ML ~~LOC~~ SOAJ
1.5000 mg | SUBCUTANEOUS | 1 refills | Status: DC
Start: 2022-07-11 — End: 2022-10-10
  Filled 2022-07-11: qty 2, 28d supply, fill #0

## 2022-07-11 NOTE — Telephone Encounter (Signed)
Trulicity backorder:

## 2022-07-11 NOTE — Addendum Note (Signed)
Addended by: Abbe Amsterdam C on: 07/11/2022 10:06 AM   Modules accepted: Orders

## 2022-07-20 ENCOUNTER — Ambulatory Visit (INDEPENDENT_AMBULATORY_CARE_PROVIDER_SITE_OTHER): Payer: Medicare PPO | Admitting: Family Medicine

## 2022-07-21 ENCOUNTER — Ambulatory Visit (INDEPENDENT_AMBULATORY_CARE_PROVIDER_SITE_OTHER): Payer: Medicare PPO | Admitting: Family Medicine

## 2022-07-27 ENCOUNTER — Ambulatory Visit (HOSPITAL_BASED_OUTPATIENT_CLINIC_OR_DEPARTMENT_OTHER)
Admission: RE | Admit: 2022-07-27 | Discharge: 2022-07-27 | Disposition: A | Discharge status: Home / Self Care | Payer: Medicare PPO | Source: Ambulatory Visit | Attending: Cardiology | Admitting: Cardiology

## 2022-07-27 DIAGNOSIS — I350 Nonrheumatic aortic (valve) stenosis: Secondary | ICD-10-CM | POA: Diagnosis not present

## 2022-07-27 DIAGNOSIS — R0609 Other forms of dyspnea: Secondary | ICD-10-CM

## 2022-07-27 LAB — ECHOCARDIOGRAM COMPLETE
AR max vel: 1.31 cm2
AV Area VTI: 1.32 cm2
AV Area mean vel: 1.32 cm2
AV Mean grad: 24.3 mmHg
AV Peak grad: 41.4 mmHg
Ao pk vel: 3.22 m/s
Area-P 1/2: 4.06 cm2
P 1/2 time: 302 msec
S' Lateral: 3 cm

## 2022-07-28 ENCOUNTER — Other Ambulatory Visit (HOSPITAL_BASED_OUTPATIENT_CLINIC_OR_DEPARTMENT_OTHER): Payer: Medicare PPO

## 2022-08-01 ENCOUNTER — Other Ambulatory Visit: Payer: Self-pay

## 2022-08-01 ENCOUNTER — Other Ambulatory Visit (HOSPITAL_BASED_OUTPATIENT_CLINIC_OR_DEPARTMENT_OTHER): Payer: Self-pay

## 2022-08-01 MED ORDER — SOLIFENACIN SUCCINATE 10 MG PO TABS
10.0000 mg | ORAL_TABLET | Freq: Every day | ORAL | 1 refills | Status: DC
  Filled 2022-08-01: qty 30, 30d supply, fill #0
  Filled 2022-08-29: qty 30, 30d supply, fill #1

## 2022-08-04 ENCOUNTER — Other Ambulatory Visit (HOSPITAL_BASED_OUTPATIENT_CLINIC_OR_DEPARTMENT_OTHER): Payer: Self-pay

## 2022-08-10 ENCOUNTER — Telehealth: Payer: Self-pay

## 2022-08-10 NOTE — Telephone Encounter (Signed)
Left message on My Chart with normal results per Dr. Krasowski's note. Routed to PCP. 

## 2022-08-15 ENCOUNTER — Other Ambulatory Visit (HOSPITAL_BASED_OUTPATIENT_CLINIC_OR_DEPARTMENT_OTHER): Payer: Self-pay

## 2022-08-16 ENCOUNTER — Ambulatory Visit (INDEPENDENT_AMBULATORY_CARE_PROVIDER_SITE_OTHER): Payer: Medicare PPO | Admitting: Family Medicine

## 2022-08-18 ENCOUNTER — Ambulatory Visit (INDEPENDENT_AMBULATORY_CARE_PROVIDER_SITE_OTHER): Payer: Medicare PPO | Admitting: Family Medicine

## 2022-08-26 ENCOUNTER — Other Ambulatory Visit (HOSPITAL_BASED_OUTPATIENT_CLINIC_OR_DEPARTMENT_OTHER): Payer: Self-pay

## 2022-08-29 ENCOUNTER — Other Ambulatory Visit (HOSPITAL_BASED_OUTPATIENT_CLINIC_OR_DEPARTMENT_OTHER): Payer: Self-pay

## 2022-08-29 ENCOUNTER — Other Ambulatory Visit: Payer: Self-pay | Admitting: Family Medicine

## 2022-08-29 DIAGNOSIS — Z8673 Personal history of transient ischemic attack (TIA), and cerebral infarction without residual deficits: Secondary | ICD-10-CM

## 2022-08-29 MED ORDER — CLOPIDOGREL BISULFATE 75 MG PO TABS
75.0000 mg | ORAL_TABLET | Freq: Every day | ORAL | 1 refills | Status: DC
Start: 2022-08-29 — End: 2023-02-13
  Filled 2022-08-29: qty 90, 90d supply, fill #0
  Filled 2022-11-28: qty 90, 90d supply, fill #1

## 2022-08-31 ENCOUNTER — Other Ambulatory Visit (HOSPITAL_BASED_OUTPATIENT_CLINIC_OR_DEPARTMENT_OTHER): Payer: Self-pay

## 2022-09-01 ENCOUNTER — Other Ambulatory Visit (HOSPITAL_BASED_OUTPATIENT_CLINIC_OR_DEPARTMENT_OTHER): Payer: Self-pay

## 2022-09-01 DIAGNOSIS — R053 Chronic cough: Secondary | ICD-10-CM | POA: Diagnosis not present

## 2022-09-01 MED ORDER — PANTOPRAZOLE SODIUM 40 MG PO TBEC
40.0000 mg | DELAYED_RELEASE_TABLET | Freq: Two times a day (BID) | ORAL | 3 refills | Status: DC
  Filled 2022-09-01 (×2): qty 180, 90d supply, fill #0
  Filled 2022-11-25: qty 180, 90d supply, fill #1
  Filled 2023-02-13 (×2): qty 180, 90d supply, fill #2
  Filled 2023-05-23: qty 180, 90d supply, fill #3

## 2022-09-13 ENCOUNTER — Ambulatory Visit (INDEPENDENT_AMBULATORY_CARE_PROVIDER_SITE_OTHER): Payer: Medicare PPO | Admitting: Family Medicine

## 2022-09-21 ENCOUNTER — Encounter (INDEPENDENT_AMBULATORY_CARE_PROVIDER_SITE_OTHER): Payer: Self-pay

## 2022-09-29 ENCOUNTER — Other Ambulatory Visit: Payer: Self-pay | Admitting: Family Medicine

## 2022-09-29 ENCOUNTER — Other Ambulatory Visit (HOSPITAL_BASED_OUTPATIENT_CLINIC_OR_DEPARTMENT_OTHER): Payer: Self-pay

## 2022-09-29 ENCOUNTER — Other Ambulatory Visit: Payer: Self-pay

## 2022-09-29 DIAGNOSIS — G4733 Obstructive sleep apnea (adult) (pediatric): Secondary | ICD-10-CM | POA: Diagnosis not present

## 2022-09-29 MED ORDER — ACCU-CHEK GUIDE VI STRP
ORAL_STRIP | 3 refills | Status: DC
  Filled 2022-09-29: qty 100, 50d supply, fill #0
  Filled 2022-11-18: qty 100, 50d supply, fill #1

## 2022-10-05 ENCOUNTER — Other Ambulatory Visit (HOSPITAL_BASED_OUTPATIENT_CLINIC_OR_DEPARTMENT_OTHER): Payer: Self-pay

## 2022-10-05 ENCOUNTER — Other Ambulatory Visit: Payer: Self-pay

## 2022-10-05 ENCOUNTER — Ambulatory Visit (INDEPENDENT_AMBULATORY_CARE_PROVIDER_SITE_OTHER): Payer: Medicare PPO | Admitting: Family Medicine

## 2022-10-05 MED ORDER — SOLIFENACIN SUCCINATE 10 MG PO TABS
10.0000 mg | ORAL_TABLET | Freq: Every day | ORAL | 0 refills | Status: DC
  Filled 2022-10-05: qty 30, 30d supply, fill #0

## 2022-10-06 NOTE — Patient Instructions (Addendum)
Great to see you again today- please see me in about 6 months Recommend flu and covid booster this fall   I will be in touch with your labs We can increase your Trulicity to 3 mg- let me know how this works for you Your BP is a bit low today- if you start to feel lightheaded we may need to scale back on your losartan, keep me posted

## 2022-10-06 NOTE — Progress Notes (Signed)
[facility] [address], [address] [place], [place] [address] [phone]  Date:  10/10/2022   Name:  Jasmin [name]   DOB:  1946-01-21   MRN:  [id number]  PCP:  Pearline Cables, MD    Chief Complaint: 6 month follow up (Concerns/ questions: 1. pt just asks when is best to get flu shot. 2. Can she take her Tylenol and ASA together?/A1C due)   History of Present Illness:  Jasmin [name] is a 77 y.o. very pleasant female patient who presents with the following:  Pt seen today for periodic recheck Last seen by myself in April   History of well-controlled diabetes, hypertension, hyperlipidemia, stroke, OSA, obesity, pulmonary hypertension, aortic stenosis   She is followed by pulmonology, Dr. Su Monks for her pulmonary hypertension and sleep apnea Her cardiologist is Dr Kirtland Bouchard- he is now following due to coronary calcium in 95th percentile   At our last visit she was working on weight loss and taking trulicity  She recently started back at the gym- she is doing water exercise and strength training  Wt Readings from Last 3 Encounters:  10/10/22 239 lb (108.4 kg)  05/23/22 243 lb 3.2 oz (110.3 kg)  04/27/22 244 lb (110.7 kg)    Seen by ENT in July Echo done in June: 1. Left ventricular ejection fraction, by estimation, is 60 to 65%. The left ventricle has normal function. The left ventricle has no regional wall motion abnormalities. There is mild concentric left ventricular hypertrophy. Left ventricular diastolic  parameters are consistent with Grade I diastolic dysfunction (impaired relaxation).   2. Right ventricular systolic function is normal. The right ventricular size is normal. There is moderately elevated pulmonary artery systolic pressure.   3. The mitral valve is degenerative. No evidence of mitral valve regurgitation. No evidence of mitral stenosis.   4. The aortic valve has an indeterminant number of cusps. There is mild  calcification of the aortic valve. Aortic valve regurgitation is mild to moderate. Moderate aortic valve stenosis.   5. Aortic Normal DTA.   6. The inferior vena cava is dilated in size with <50% respiratory variability, suggesting right atrial pressure of 15 mmHg.   Recommend covid booster and flu this fall Most recent labs done in February   Lab Results  Component Value Date   HGBA1C 6.8 (H) 04/11/2022   Asa 81 Plavix  Trulicity Estrogen cream Losartan 75 Metformin 500 BID- taking ONCE daily now due to using GLP Myrbetriq Lovaza Pantoprazole simvastatin   BP Readings from Last 3 Encounters:  10/10/22 118/60  05/23/22 122/72  04/27/22 (!) 154/78    Patient Active Problem List   Diagnosis Date Noted   SCC (squamous cell carcinoma), arm, left 03/16/2022   Obesity, Beginning BMI 46.86 03/15/2022   Elevated coronary artery calcium score 02/24/2022   Left sided sciatica 07/05/2021   Skin cancer 11/04/2020   BMI 45.0-49.9, adult (HCC) 07/31/2020   Swelling of both lower extremities    Shortness of breath on exertion    Red eyes    Knee pain    Joint pain    Decreased hearing    Back pain    Nocturia 12/26/2018   Recurrent UTI 12/26/2018   Vaginal atrophy 12/26/2018   Hyperopia with astigmatism and presbyopia, bilateral 02/09/2018   Long term current use of oral hypoglycemic drug 02/09/2018   PCO (posterior capsular opacification), left 02/09/2018   Pseudophakia of left eye 02/09/2018  PVD (posterior vitreous detachment), both eyes 02/09/2018   Arthritis 12/16/2016   Right kidney stone    Hyperlipidemia    Heart murmur    Diabetes mellitus without complication (HCC)    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   Cystocele with rectocele 08/21/2016   Mixed hyperlipidemia 08/08/2016   Controlled type 2 diabetes mellitus without complication, without long-term current use of insulin (HCC) 08/08/2016   Essential hypertension 08/08/2016   History of CVA  (cerebrovascular accident) 08/08/2016   Nuclear sclerotic cataract of left eye 07/07/2016   OAB (overactive bladder) 01/20/2016   Class 3 severe obesity with serious comorbidity and body mass index (BMI) of 45.0 to 49.9 in adult (HCC) 11/10/2015   IBS (irritable bowel syndrome) 07/16/2015   Insomnia 07/16/2015   Left ventricular hypertrophy 07/16/2015   Pulmonary hypertension (HCC) 07/16/2015   Primary osteoarthritis of left knee 03/31/2015   Acid reflux disease 03/09/2015   Aortic stenosis 03/09/2015   AR (allergic rhinitis) 03/09/2015   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   OSA (obstructive sleep apnea) 03/09/2015   Osteoporosis 03/09/2015   Vitamin D deficiency 03/09/2015   Left tibialis posterior tendinitis 12/13/2013    Past Medical History:  Diagnosis Date   Acid reflux disease 03/09/2015   Aortic stenosis 03/09/2015   AR (allergic rhinitis) 03/09/2015   Arthritis    Back pain    Controlled type 2 diabetes mellitus without complication, without long-term current use of insulin (HCC) 08/08/2016   Cough    Cystocele with rectocele 08/21/2016   Decreased hearing    Diabetes mellitus without complication (HCC)    Dry mouth    Easy bruising    Essential hypertension 08/08/2016   Heart murmur    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   History of CVA (cerebrovascular accident) 08/08/2016   Seen on MRI from 08/2015   Hyperlipidemia    Hypertension    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   IBS (irritable bowel syndrome) 07/16/2015   Insomnia 07/16/2015   Joint pain    Knee pain    Left ventricular hypertrophy 07/16/2015   Leg cramping    right leg   Mixed hyperlipidemia 08/08/2016   Morbid obesity (HCC) 11/10/2015   Nasal discharge    Nuclear sclerotic cataract of left eye 07/07/2016   OAB (overactive bladder) 01/20/2016   OSA (obstructive sleep apnea) 03/09/2015   Osteoporosis 03/09/2015   Primary osteoarthritis of left knee 03/31/2015   Pulmonary hypertension (HCC)  07/16/2015   Red eyes    Right kidney stone    Shortness of breath on exertion    Skin cancer 11/04/2020   Sleep apnea    Stroke (HCC)    Swelling of both lower extremities    UTI (urinary tract infection)    Vitamin D deficiency 03/09/2015    Past Surgical History:  Procedure Laterality Date   ABDOMINAL HYSTERECTOMY  09/14/2016   CATARACT EXTRACTION Left    CATARACT EXTRACTION Left 06/2016   COLPORRHAPHY N/A 09/14/2016   [facility]   CYSTOCELE REPAIR     FRACTURE SURGERY Left 1953   L arm   FRACTURE SURGERY Left 1985   L ankle   HERNIA REPAIR  2004   JOINT REPLACEMENT Right 2012   R TKA   JOINT REPLACEMENT Left 2017   KIDNEY STONE SURGERY     LAPAROSCOPIC ASSISTED VAGINAL HYSTERECTOMY N/A 09/14/2016   [facility]   R foot/ankle Right 2013 and 2014   plate and screws lateral foot and tendon removal medial  foot in 2013, revision in 2014   RECTOCELE REPAIR     URETERAL STENT PLACEMENT     URETEROSCOPY      Social History   Tobacco Use   Smoking status: Never    Passive exposure: Never   Smokeless tobacco: Never  Vaping Use   Vaping status: Never Used  Substance Use Topics   Alcohol use: Yes    Alcohol/week: 2.0 - 3.0 standard drinks of alcohol    Types: 2 - 3 Glasses of wine per week    Comment: couple of glasses per week   Drug use: No    Family History  Problem Relation Age of Onset   Hyperlipidemia Mother    Heart disease Mother    Hypertension Mother    Stroke Mother    Thyroid disease Mother    Hypertension Father    Heart disease Father     Allergies  Allergen Reactions   Latex Itching   Ciprofloxacin Hives   Metronidazole Hives   Adhesive [Tape] Rash    Medication list has been reviewed and updated.  Current Outpatient Medications on File Prior to Visit  Medication Sig Dispense Refill   Accu-Chek Softclix Lancets lancets USE AS DIRECTED TO CHECK BLOOD GLUCOSE ONCE DAILY (Patient taking differently: 1 each by Other route See admin instructions. USE AS  DIRECTED TO CHECK BLOOD GLUCOSE ONCE DAILY) 100 each 3   Acetaminophen (TYLENOL ARTHRITIS PAIN PO) Take 650 mg by mouth every 8 (eight) hours as needed (PAIN).     Ascorbic Acid (VITAMIN C WITH ROSE HIPS) 500 MG tablet Take 500 mg by mouth daily.     aspirin EC 81 MG tablet Take 1 tablet (81 mg total) by mouth daily. Swallow whole. 120 tablet 3   blood glucose meter kit and supplies KIT Dispense based on patient and insurance preference. Use once daily to monitor glucose as needed (FOR ICD-9 250.00, 250.01). (Patient taking differently: Inject 1 each into the skin as directed. Dispense based on patient and insurance preference. Use once daily to monitor glucose as needed (FOR ICD-9 250.00, 250.01).) 1 each 0   CALCIUM PO Take 1 tablet by mouth daily.     Cholecalciferol (VITAMIN D3) 2000 units TABS Take 1 tablet by mouth in the morning. Take one tablet in the AM     clopidogrel (PLAVIX) 75 MG tablet Take 1 tablet (75 mg total) by mouth daily. 90 tablet 1   clotrimazole-betamethasone (LOTRISONE) cream Apply to plantar foot daily (Patient taking differently: Apply 1 Application topically 2 (two) times daily.) 45 g 11   cycloSPORINE (RESTASIS) 0.05 % ophthalmic emulsion Place 1 drop into both eyes 2 (two) times daily. 180 each 1   desmopressin (DDAVP) 0.2 MG tablet Take 1 tablet by mouth nightly (Patient taking differently: Take 0.2 mg by mouth at bedtime.) 90 tablet 3   desonide (DESOWEN) 0.05 % cream Apply as directed to affected area. 15 g 0   Dulaglutide (TRULICITY) 1.5 MG/0.5ML SOPN Inject 1.5 mg into the skin once a week. 6 mL 1   econazole nitrate 1 % cream Apply 1 Application topically as needed (skin irritation).     estradiol (ESTRACE) 0.1 MG/GM vaginal cream Apply 0.5 grams vaginally nightly for 2 weeks, then apply 0.5 grams 2 times weekly 42.5 g 3   fluticasone (FLONASE) 50 MCG/ACT nasal spray Place 2 sprays into both nostrils daily. 48 g 3   glucose blood (ACCU-CHEK GUIDE) test strip USE AS  DIRECTED TO CHECK BLOOD  GLUCOSE ONCE DAILY 100 strip 3   losartan (COZAAR) 50 MG tablet Take 1 & 1/2 tablets (75 mg total) by mouth daily. 135 tablet 3   metFORMIN (GLUCOPHAGE) 500 MG tablet Take 1 tablet (500 mg total) by mouth 2 (two) times daily with a meal. 180 tablet 3   mirabegron ER (MYRBETRIQ) 50 MG TB24 tablet Take 1 tablet (50 mg total) by mouth daily. 90 tablet 3   omega-3 acid ethyl esters (LOVAZA) 1 g capsule Take 2 capsules (2 g total) by mouth 2 (two) times daily. 360 capsule 3   omeprazole (PRILOSEC) 40 MG capsule Take 1 capsule (40 mg total) by mouth daily. 30 capsule 11   OXYCODONE HCL PO Take 1 tablet by mouth as needed (pain).     pantoprazole (PROTONIX) 40 MG tablet Take 1 tablet (40 mg total) by mouth 2 (two) times daily. 180 tablet 3   Polyethyl Glycol-Propyl Glycol (SYSTANE OP) Apply 1 tablet to eye daily.     PSYLLIUM PO Take 1 tablet by mouth daily.     simvastatin (ZOCOR) 20 MG tablet Take 1 tablet (20 mg total) by mouth at bedtime. 90 tablet 3   solifenacin (VESICARE) 10 MG tablet Take 1 tablet (10 mg total) by mouth daily. 30 tablet 0   Vitamin D, Ergocalciferol, (DRISDOL) 1.25 MG (50000 UNIT) CAPS capsule Take 1 capsule (50,000 Units total) by mouth every 7 (seven) days. 4 capsule 0   No current facility-administered medications on file prior to visit.    Review of Systems:  As per HPI- otherwise negative.   Physical Examination: Vitals:   10/10/22 0806  BP: 118/60  Pulse: 86  Resp: 18  Temp: 98.4 F (36.9 C)  SpO2: 93%   Vitals:   10/10/22 0806  Weight: 239 lb (108.4 kg)  Height: 5\' 1"  (1.549 m)   Body mass index is 45.16 kg/m. Ideal Body Weight: Weight in (lb) to have BMI = 25: 132  GEN: no acute distress.  Obese, looks well  HEENT: Atraumatic, Normocephalic.  Bilateral TM wnl, oropharynx normal.  PEERL,EOMI.   Ears and Nose: No external deformity. CV: RRR, No M/G/R. No JVD. No thrill. No extra heart sounds. PULM: CTA B, no wheezes,  crackles, rhonchi. No retractions. No resp. distress. No accessory muscle use. ABD: S, NT, ND,. No rebound. No HSM. EXTR: No c/c/e PSYCH: Normally interactive. Conversant.    Assessment and Plan: BMI 45.0-49.9, adult (HCC)  Type 2 diabetes mellitus with other specified complication, without long-term current use of insulin (HCC) - Plan: Basic metabolic panel, Hemoglobin A1c, Microalbumin / creatinine urine ratio, Dulaglutide (TRULICITY) 3 MG/0.5ML SOPN  Essential hypertension - Plan: CBC  Mixed hyperlipidemia - Plan: Lipid panel  Adjust trulicity She is working on weight loss Discussed BP- running slightly low, no sx -I asked her to keep an eye on this  Great to see you again today- please see me in about 6 months Recommend flu and covid booster this fall   I will be in touch with your labs We can increase your Trulicity to 3 mg- let me know how this works for you Your BP is a bit low today- if you start to feel lightheaded we may need to scale back on your losartan, keep me posted Signed Abbe Amsterdam, MD  Received labs as below, message to patient  Results for orders placed or performed in visit on 10/10/22  CBC  Result Value Ref Range   WBC 6.6 4.0 - 10.5 K/uL  RBC 4.53 3.87 - 5.11 Mil/uL   Platelets 281.0 150.0 - 400.0 K/uL   Hemoglobin 12.9 12.0 - 15.0 g/dL   HCT 95.2 84.1 - 32.4 %   MCV 86.8 78.0 - 100.0 fl   MCHC 32.8 30.0 - 36.0 g/dL   RDW 40.1 (H) 02.7 - 25.3 %  Basic metabolic panel  Result Value Ref Range   Sodium 137 135 - 145 mEq/L   Potassium 4.3 3.5 - 5.1 mEq/L   Chloride 101 96 - 112 mEq/L   CO2 25 19 - 32 mEq/L   Glucose, Bld 123 (H) 70 - 99 mg/dL   BUN 16 6 - 23 mg/dL   Creatinine, Ser 6.64 0.40 - 1.20 mg/dL   GFR 40.34 >74.25 mL/min   Calcium 9.3 8.4 - 10.5 mg/dL  Hemoglobin Z5G  Result Value Ref Range   Hgb A1c MFr Bld 6.4 4.6 - 6.5 %  Lipid panel  Result Value Ref Range   Cholesterol 157 0 - 200 mg/dL   Triglycerides 387.5 (H) 0.0 -  149.0 mg/dL   HDL 64.33 >29.51 mg/dL   VLDL 88.4 (H) 0.0 - 16.6 mg/dL   Total CHOL/HDL Ratio 4    NonHDL 117.91   Microalbumin / creatinine urine ratio  Result Value Ref Range   Microalb, Ur <0.7 0.0 - 1.9 mg/dL   Creatinine,U 06.3 mg/dL   Microalb Creat Ratio 0.9 0.0 - 30.0 mg/g  LDL cholesterol, direct  Result Value Ref Range   Direct LDL 100.0 mg/dL

## 2022-10-10 ENCOUNTER — Encounter: Payer: Self-pay | Admitting: Family Medicine

## 2022-10-10 ENCOUNTER — Other Ambulatory Visit (HOSPITAL_BASED_OUTPATIENT_CLINIC_OR_DEPARTMENT_OTHER): Payer: Self-pay

## 2022-10-10 ENCOUNTER — Ambulatory Visit: Payer: Medicare PPO | Admitting: Family Medicine

## 2022-10-10 VITALS — BP 118/60 | HR 86 | Temp 98.4°F | Resp 18 | Ht 61.0 in | Wt 239.0 lb

## 2022-10-10 DIAGNOSIS — Z6841 Body Mass Index (BMI) 40.0 and over, adult: Secondary | ICD-10-CM

## 2022-10-10 DIAGNOSIS — Z7985 Long-term (current) use of injectable non-insulin antidiabetic drugs: Secondary | ICD-10-CM

## 2022-10-10 DIAGNOSIS — E1169 Type 2 diabetes mellitus with other specified complication: Secondary | ICD-10-CM | POA: Diagnosis not present

## 2022-10-10 DIAGNOSIS — I10 Essential (primary) hypertension: Secondary | ICD-10-CM | POA: Diagnosis not present

## 2022-10-10 DIAGNOSIS — E782 Mixed hyperlipidemia: Secondary | ICD-10-CM

## 2022-10-10 LAB — BASIC METABOLIC PANEL
BUN: 16 mg/dL (ref 6–23)
CO2: 25 mEq/L (ref 19–32)
Calcium: 9.3 mg/dL (ref 8.4–10.5)
Chloride: 101 mEq/L (ref 96–112)
Creatinine, Ser: 0.62 mg/dL (ref 0.40–1.20)
GFR: 85.79 mL/min (ref >60.00)
Glucose, Bld: 123 mg/dL — ABNORMAL HIGH (ref 70–99)
Potassium: 4.3 mEq/L (ref 3.5–5.1)
Sodium: 137 mEq/L (ref 135–145)

## 2022-10-10 LAB — CBC
HCT: 39.3 % (ref 36.0–46.0)
Hemoglobin: 12.9 g/dL (ref 12.0–15.0)
MCHC: 32.8 g/dL (ref 30.0–36.0)
MCV: 86.8 fl (ref 78.0–100.0)
Platelets: 281 10*3/uL (ref 150.0–400.0)
RBC: 4.53 Mil/uL (ref 3.87–5.11)
RDW: 16.6 % — ABNORMAL HIGH (ref 11.5–15.5)
WBC: 6.6 10*3/uL (ref 4.0–10.5)

## 2022-10-10 LAB — LIPID PANEL
Cholesterol: 157 mg/dL (ref 0–200)
HDL: 39.1 mg/dL (ref >39.00)
NonHDL: 117.91
Total CHOL/HDL Ratio: 4
Triglycerides: 219 mg/dL — ABNORMAL HIGH (ref 0.0–149.0)
VLDL: 43.8 mg/dL — ABNORMAL HIGH (ref 0.0–40.0)

## 2022-10-10 LAB — LDL CHOLESTEROL, DIRECT: Direct LDL: 100 mg/dL

## 2022-10-10 LAB — MICROALBUMIN / CREATININE URINE RATIO
Creatinine,U: 75.9 mg/dL
Microalb Creat Ratio: 0.9 mg/g (ref 0.0–30.0)
Microalb, Ur: <0.7 mg/dL (ref 0.0–1.9)

## 2022-10-10 LAB — HEMOGLOBIN A1C: Hgb A1c MFr Bld: 6.4 % (ref 4.6–6.5)

## 2022-10-10 MED ORDER — TRULICITY 3 MG/0.5ML ~~LOC~~ SOAJ
3.0000 mg | SUBCUTANEOUS | 2 refills | Status: DC
Start: 2022-10-10 — End: 2023-04-27

## 2022-10-11 ENCOUNTER — Other Ambulatory Visit (HOSPITAL_BASED_OUTPATIENT_CLINIC_OR_DEPARTMENT_OTHER): Payer: Self-pay

## 2022-10-11 DIAGNOSIS — Z79899 Other long term (current) drug therapy: Secondary | ICD-10-CM | POA: Diagnosis not present

## 2022-10-11 DIAGNOSIS — N3941 Urge incontinence: Secondary | ICD-10-CM | POA: Diagnosis not present

## 2022-10-11 DIAGNOSIS — Z8673 Personal history of transient ischemic attack (TIA), and cerebral infarction without residual deficits: Secondary | ICD-10-CM | POA: Diagnosis not present

## 2022-10-11 DIAGNOSIS — Z85828 Personal history of other malignant neoplasm of skin: Secondary | ICD-10-CM | POA: Diagnosis not present

## 2022-10-11 DIAGNOSIS — N3944 Nocturnal enuresis: Secondary | ICD-10-CM | POA: Diagnosis not present

## 2022-10-11 DIAGNOSIS — G4733 Obstructive sleep apnea (adult) (pediatric): Secondary | ICD-10-CM | POA: Diagnosis not present

## 2022-10-11 DIAGNOSIS — I2510 Atherosclerotic heart disease of native coronary artery without angina pectoris: Secondary | ICD-10-CM | POA: Diagnosis not present

## 2022-10-11 DIAGNOSIS — Z8249 Family history of ischemic heart disease and other diseases of the circulatory system: Secondary | ICD-10-CM | POA: Diagnosis not present

## 2022-10-11 DIAGNOSIS — K219 Gastro-esophageal reflux disease without esophagitis: Secondary | ICD-10-CM | POA: Diagnosis not present

## 2022-10-11 DIAGNOSIS — B353 Tinea pedis: Secondary | ICD-10-CM | POA: Diagnosis not present

## 2022-10-11 DIAGNOSIS — M1990 Unspecified osteoarthritis, unspecified site: Secondary | ICD-10-CM | POA: Diagnosis not present

## 2022-10-11 DIAGNOSIS — I10 Essential (primary) hypertension: Secondary | ICD-10-CM | POA: Diagnosis not present

## 2022-10-11 DIAGNOSIS — E119 Type 2 diabetes mellitus without complications: Secondary | ICD-10-CM | POA: Diagnosis not present

## 2022-10-11 DIAGNOSIS — H04129 Dry eye syndrome of unspecified lacrimal gland: Secondary | ICD-10-CM | POA: Diagnosis not present

## 2022-10-11 DIAGNOSIS — Z7984 Long term (current) use of oral hypoglycemic drugs: Secondary | ICD-10-CM | POA: Diagnosis not present

## 2022-10-11 DIAGNOSIS — J302 Other seasonal allergic rhinitis: Secondary | ICD-10-CM | POA: Diagnosis not present

## 2022-10-11 DIAGNOSIS — R011 Cardiac murmur, unspecified: Secondary | ICD-10-CM | POA: Diagnosis not present

## 2022-10-11 DIAGNOSIS — E785 Hyperlipidemia, unspecified: Secondary | ICD-10-CM | POA: Diagnosis not present

## 2022-10-12 ENCOUNTER — Other Ambulatory Visit (HOSPITAL_BASED_OUTPATIENT_CLINIC_OR_DEPARTMENT_OTHER): Payer: Self-pay

## 2022-10-13 ENCOUNTER — Other Ambulatory Visit (HOSPITAL_BASED_OUTPATIENT_CLINIC_OR_DEPARTMENT_OTHER): Payer: Self-pay

## 2022-10-13 DIAGNOSIS — N390 Urinary tract infection, site not specified: Secondary | ICD-10-CM | POA: Diagnosis not present

## 2022-10-13 DIAGNOSIS — N3281 Overactive bladder: Secondary | ICD-10-CM | POA: Diagnosis not present

## 2022-10-13 MED ORDER — DESMOPRESSIN ACETATE 0.2 MG PO TABS
0.2000 mg | ORAL_TABLET | Freq: Every day | ORAL | 3 refills | Status: DC
  Filled 2022-10-13: qty 90, 90d supply, fill #0
  Filled 2023-01-06: qty 90, 90d supply, fill #1
  Filled 2023-04-06: qty 90, 90d supply, fill #2
  Filled 2023-07-10: qty 90, 90d supply, fill #3

## 2022-10-13 MED ORDER — ESTRADIOL 0.1 MG/GM VA CREA
2.0000 g | TOPICAL_CREAM | VAGINAL | 3 refills | Status: DC
  Filled 2022-10-13: qty 42.5, 30d supply, fill #0
  Filled 2022-11-07: qty 42.5, 30d supply, fill #1
  Filled 2022-12-07: qty 42.5, 30d supply, fill #2

## 2022-10-17 ENCOUNTER — Other Ambulatory Visit (HOSPITAL_BASED_OUTPATIENT_CLINIC_OR_DEPARTMENT_OTHER): Payer: Self-pay

## 2022-10-17 MED ORDER — CEPHALEXIN 500 MG PO CAPS
ORAL_CAPSULE | ORAL | 0 refills | Status: DC
  Filled 2022-10-17: qty 10, 5d supply, fill #0

## 2022-10-18 ENCOUNTER — Other Ambulatory Visit (HOSPITAL_BASED_OUTPATIENT_CLINIC_OR_DEPARTMENT_OTHER): Payer: Self-pay

## 2022-10-19 ENCOUNTER — Other Ambulatory Visit: Payer: Self-pay

## 2022-10-19 ENCOUNTER — Other Ambulatory Visit (HOSPITAL_BASED_OUTPATIENT_CLINIC_OR_DEPARTMENT_OTHER): Payer: Self-pay

## 2022-10-19 DIAGNOSIS — H16223 Keratoconjunctivitis sicca, not specified as Sjogren's, bilateral: Secondary | ICD-10-CM | POA: Diagnosis not present

## 2022-10-19 MED ORDER — CYCLOSPORINE 0.05 % OP EMUL
1.0000 [drp] | Freq: Two times a day (BID) | OPHTHALMIC | 3 refills | Status: DC
  Filled 2022-10-19: qty 16.5, 90d supply, fill #0
  Filled 2023-01-14: qty 16.5, 90d supply, fill #1
  Filled 2023-04-14: qty 16.5, 90d supply, fill #2

## 2022-10-26 ENCOUNTER — Ambulatory Visit (INDEPENDENT_AMBULATORY_CARE_PROVIDER_SITE_OTHER): Payer: Medicare PPO

## 2022-10-26 DIAGNOSIS — Z23 Encounter for immunization: Secondary | ICD-10-CM | POA: Diagnosis not present

## 2022-10-27 ENCOUNTER — Encounter: Payer: Self-pay | Admitting: Family Medicine

## 2022-10-31 ENCOUNTER — Other Ambulatory Visit (HOSPITAL_BASED_OUTPATIENT_CLINIC_OR_DEPARTMENT_OTHER): Payer: Self-pay

## 2022-10-31 MED ORDER — COVID-19 MRNA VAC-TRIS(PFIZER) 30 MCG/0.3ML IM SUSY
0.3000 mL | PREFILLED_SYRINGE | Freq: Once | INTRAMUSCULAR | 0 refills | Status: AC
  Filled 2022-10-31: qty 0.3, 1d supply, fill #0

## 2022-11-01 ENCOUNTER — Other Ambulatory Visit (HOSPITAL_BASED_OUTPATIENT_CLINIC_OR_DEPARTMENT_OTHER): Payer: Self-pay

## 2022-11-01 MED ORDER — AMOXICILLIN 500 MG PO CAPS
2000.0000 mg | ORAL_CAPSULE | Freq: Every day | ORAL | 5 refills | Status: DC
  Filled 2022-11-01 (×2): qty 12, 3d supply, fill #0
  Filled 2022-11-08 (×2): qty 12, 3d supply, fill #1

## 2022-11-08 ENCOUNTER — Other Ambulatory Visit (HOSPITAL_BASED_OUTPATIENT_CLINIC_OR_DEPARTMENT_OTHER): Payer: Self-pay

## 2022-11-08 ENCOUNTER — Ambulatory Visit (INDEPENDENT_AMBULATORY_CARE_PROVIDER_SITE_OTHER): Payer: Medicare PPO | Admitting: Family Medicine

## 2022-11-08 ENCOUNTER — Other Ambulatory Visit: Payer: Self-pay

## 2022-11-08 MED ORDER — SOLIFENACIN SUCCINATE 10 MG PO TABS
10.0000 mg | ORAL_TABLET | Freq: Every day | ORAL | 0 refills | Status: DC
  Filled 2022-11-08: qty 30, 30d supply, fill #0

## 2022-11-09 ENCOUNTER — Other Ambulatory Visit (HOSPITAL_BASED_OUTPATIENT_CLINIC_OR_DEPARTMENT_OTHER): Payer: Self-pay

## 2022-11-09 MED ORDER — CLOTRIMAZOLE-BETAMETHASONE 1-0.05 % EX CREA
TOPICAL_CREAM | CUTANEOUS | 3 refills | Status: DC
  Filled 2022-11-09: qty 45, 30d supply, fill #0
  Filled 2022-12-06: qty 45, 30d supply, fill #1
  Filled 2023-01-04: qty 45, 30d supply, fill #2
  Filled 2023-02-13: qty 45, 30d supply, fill #3

## 2022-11-10 ENCOUNTER — Other Ambulatory Visit: Payer: Self-pay

## 2022-11-15 ENCOUNTER — Encounter: Payer: Self-pay | Admitting: Family Medicine

## 2022-11-17 ENCOUNTER — Other Ambulatory Visit (INDEPENDENT_AMBULATORY_CARE_PROVIDER_SITE_OTHER): Payer: Medicare PPO

## 2022-11-17 ENCOUNTER — Other Ambulatory Visit (HOSPITAL_BASED_OUTPATIENT_CLINIC_OR_DEPARTMENT_OTHER): Payer: Self-pay

## 2022-11-17 ENCOUNTER — Encounter (INDEPENDENT_AMBULATORY_CARE_PROVIDER_SITE_OTHER): Payer: Medicare PPO | Admitting: Family Medicine

## 2022-11-17 DIAGNOSIS — R350 Frequency of micturition: Secondary | ICD-10-CM

## 2022-11-17 LAB — POCT URINALYSIS DIP (MANUAL ENTRY)
Bilirubin, UA: NEGATIVE
Blood, UA: NEGATIVE
Glucose, UA: NEGATIVE mg/dL
Ketones, POC UA: NEGATIVE mg/dL
Nitrite, UA: POSITIVE — AB
Protein Ur, POC: NEGATIVE mg/dL
Spec Grav, UA: 1.01 (ref 1.010–1.025)
Urobilinogen, UA: 0.2 E.U./dL
pH, UA: 7 (ref 5.0–8.0)

## 2022-11-17 LAB — URINALYSIS, MICROSCOPIC ONLY: RBC / HPF: NONE SEEN (ref 0–?)

## 2022-11-17 MED ORDER — CEPHALEXIN 500 MG PO CAPS
500.0000 mg | ORAL_CAPSULE | Freq: Two times a day (BID) | ORAL | 0 refills | Status: DC
  Filled 2022-11-17: qty 14, 7d supply, fill #0

## 2022-11-17 NOTE — Progress Notes (Signed)
Provided urine specimen

## 2022-11-17 NOTE — Telephone Encounter (Signed)

## 2022-11-17 NOTE — Addendum Note (Signed)
Addended by: Abbe Amsterdam C on: 11/17/2022 03:43 PM   Modules accepted: Orders

## 2022-11-20 LAB — URINE CULTURE
MICRO NUMBER:: 15520512
SPECIMEN QUALITY:: ADEQUATE

## 2022-11-20 MED ORDER — SULFAMETHOXAZOLE-TRIMETHOPRIM 800-160 MG PO TABS
1.0000 | ORAL_TABLET | Freq: Two times a day (BID) | ORAL | 0 refills | Status: DC
  Filled 2022-11-20: qty 10, 5d supply, fill #0

## 2022-11-20 NOTE — Addendum Note (Signed)
Addended by: Abbe Amsterdam C on: 11/20/2022 08:02 PM   Modules accepted: Orders

## 2022-11-21 ENCOUNTER — Other Ambulatory Visit (HOSPITAL_BASED_OUTPATIENT_CLINIC_OR_DEPARTMENT_OTHER): Payer: Self-pay

## 2022-11-28 ENCOUNTER — Other Ambulatory Visit (HOSPITAL_BASED_OUTPATIENT_CLINIC_OR_DEPARTMENT_OTHER): Payer: Self-pay

## 2022-11-28 MED ORDER — SOLIFENACIN SUCCINATE 10 MG PO TABS
10.0000 mg | ORAL_TABLET | Freq: Every day | ORAL | 0 refills | Status: DC
  Filled 2022-11-28 – 2022-12-02 (×2): qty 30, 30d supply, fill #0

## 2022-11-28 MED ORDER — DESONIDE 0.05 % EX CREA
TOPICAL_CREAM | CUTANEOUS | 0 refills | Status: DC
  Filled 2022-11-28: qty 15, 14d supply, fill #0

## 2022-11-29 ENCOUNTER — Other Ambulatory Visit (HOSPITAL_BASED_OUTPATIENT_CLINIC_OR_DEPARTMENT_OTHER): Payer: Self-pay

## 2022-12-02 ENCOUNTER — Other Ambulatory Visit (HOSPITAL_BASED_OUTPATIENT_CLINIC_OR_DEPARTMENT_OTHER): Payer: Self-pay

## 2022-12-06 ENCOUNTER — Ambulatory Visit (INDEPENDENT_AMBULATORY_CARE_PROVIDER_SITE_OTHER): Payer: Medicare PPO | Admitting: Family Medicine

## 2022-12-07 ENCOUNTER — Other Ambulatory Visit: Payer: Self-pay

## 2023-01-02 DIAGNOSIS — Z7409 Other reduced mobility: Secondary | ICD-10-CM | POA: Insufficient documentation

## 2023-01-03 ENCOUNTER — Ambulatory Visit (INDEPENDENT_AMBULATORY_CARE_PROVIDER_SITE_OTHER): Payer: Medicare PPO | Admitting: *Deleted

## 2023-01-03 ENCOUNTER — Encounter: Payer: Self-pay | Admitting: Cardiology

## 2023-01-03 ENCOUNTER — Ambulatory Visit: Payer: Medicare PPO | Attending: Cardiology | Admitting: Cardiology

## 2023-01-03 VITALS — BP 140/72 | HR 74 | Ht 61.0 in | Wt 236.0 lb

## 2023-01-03 VITALS — BP 152/45 | HR 73 | Ht 61.0 in | Wt 238.0 lb

## 2023-01-03 DIAGNOSIS — R931 Abnormal findings on diagnostic imaging of heart and coronary circulation: Secondary | ICD-10-CM

## 2023-01-03 DIAGNOSIS — E119 Type 2 diabetes mellitus without complications: Secondary | ICD-10-CM

## 2023-01-03 DIAGNOSIS — I350 Nonrheumatic aortic (valve) stenosis: Secondary | ICD-10-CM

## 2023-01-03 DIAGNOSIS — Z0000 Encounter for general adult medical examination without abnormal findings: Secondary | ICD-10-CM | POA: Diagnosis not present

## 2023-01-03 DIAGNOSIS — G4733 Obstructive sleep apnea (adult) (pediatric): Secondary | ICD-10-CM | POA: Diagnosis not present

## 2023-01-03 DIAGNOSIS — E782 Mixed hyperlipidemia: Secondary | ICD-10-CM

## 2023-01-03 NOTE — Progress Notes (Signed)
Cardiology Office Note:    Date:  01/03/2023   ID:  Jasmin [name], DOB 08/01/1945, MRN [id number]  PCP:  Pearline Cables, MD  Cardiologist:  Gypsy Balsam, MD    Referring MD: Pearline Cables, MD   Chief Complaint  Patient presents with   Follow-up  Doing fine  History of Present Illness:    Jasmin [name] is a 77 y.o. female past medical history significant for arctic stenosis which appears to be moderate with mean gradient of 24 valve area 1.3 mild dimension index 0.42, elevated calcium score of 1245, which is 95% for her age and sex group.  Also diabetes.  Comes today to months for follow-up overall doing very well started doing exercises water aerobics twice a week and some cardio, doing very well and she is enjoying it.  Denies have any chest pain tightness squeezing pressure burning chest no palpitation dizziness swelling of lower extremities  Past Medical History:  Diagnosis Date   Acid reflux disease 03/09/2015   Aortic stenosis 03/09/2015   AR (allergic rhinitis) 03/09/2015   Arthritis    Back pain    Controlled type 2 diabetes mellitus without complication, without long-term current use of insulin (HCC) 08/08/2016   Cough    Cystocele with rectocele 08/21/2016   Decreased hearing    Diabetes mellitus without complication (HCC)    Dry mouth    Easy bruising    Essential hypertension 08/08/2016   Heart murmur    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   History of CVA (cerebrovascular accident) 08/08/2016   Seen on MRI from 08/2015   Hyperlipidemia    Hypertension    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   IBS (irritable bowel syndrome) 07/16/2015   Insomnia 07/16/2015   Joint pain    Knee pain    Left ventricular hypertrophy 07/16/2015   Leg cramping    right leg   Mixed hyperlipidemia 08/08/2016   Morbid obesity (HCC) 11/10/2015   Nasal discharge    Nuclear sclerotic cataract of left eye 07/07/2016   OAB (overactive bladder) 01/20/2016   OSA  (obstructive sleep apnea) 03/09/2015   Osteoporosis 03/09/2015   Primary osteoarthritis of left knee 03/31/2015   Pulmonary hypertension (HCC) 07/16/2015   Red eyes    Right kidney stone    Shortness of breath on exertion    Skin cancer 11/04/2020   Sleep apnea    Stroke (HCC)    Swelling of both lower extremities    UTI (urinary tract infection)    Vitamin D deficiency 03/09/2015    Past Surgical History:  Procedure Laterality Date   ABDOMINAL HYSTERECTOMY  09/14/2016   CATARACT EXTRACTION Left    CATARACT EXTRACTION Left 06/2016   COLPORRHAPHY N/A 09/14/2016   [facility]   CYSTOCELE REPAIR     FRACTURE SURGERY Left 1953   L arm   FRACTURE SURGERY Left 1985   L ankle   HERNIA REPAIR  2004   JOINT REPLACEMENT Right 2012   R TKA   JOINT REPLACEMENT Left 2017   KIDNEY STONE SURGERY     LAPAROSCOPIC ASSISTED VAGINAL HYSTERECTOMY N/A 09/14/2016   [facility]   R foot/ankle Right 2013 and 2014   plate and screws lateral foot and tendon removal medial foot in 2013, revision in 2014   RECTOCELE REPAIR     URETERAL STENT PLACEMENT     URETEROSCOPY      Current Medications: Current Meds  Medication Sig   Accu-Chek Softclix Lancets lancets USE AS  DIRECTED TO CHECK BLOOD GLUCOSE ONCE DAILY (Patient taking differently: 1 each by Other route See admin instructions. USE AS DIRECTED TO CHECK BLOOD GLUCOSE ONCE DAILY)   Acetaminophen (TYLENOL ARTHRITIS PAIN PO) Take 650 mg by mouth every 8 (eight) hours as needed (PAIN).   Ascorbic Acid (VITAMIN C WITH ROSE HIPS) 500 MG tablet Take 500 mg by mouth daily.   aspirin EC 81 MG tablet Take 1 tablet (81 mg total) by mouth daily. Swallow whole.   blood glucose meter kit and supplies KIT Dispense based on patient and insurance preference. Use once daily to monitor glucose as needed (FOR ICD-9 250.00, 250.01). (Patient taking differently: Inject 1 each into the skin as directed. Dispense based on patient and insurance preference. Use once daily to monitor glucose  as needed (FOR ICD-9 250.00, 250.01).)   CALCIUM PO Take 1 tablet by mouth daily.   Cholecalciferol (VITAMIN D3) 2000 units TABS Take 2 tablets by mouth in the morning. Take one tablet in the AM   clopidogrel (PLAVIX) 75 MG tablet Take 1 tablet (75 mg total) by mouth daily.   clotrimazole-betamethasone (LOTRISONE) cream Apply to plantar foot daily (Patient taking differently: Apply 1 Application topically 2 (two) times daily.)   CRANBERRY EXTRACT PO Take 25,000 mg by mouth 2 (two) times daily.   cycloSPORINE (RESTASIS) 0.05 % ophthalmic emulsion Place 1 drop into both eyes 2 (two) times daily.   desmopressin (DDAVP) 0.2 MG tablet Take 1 tablet (0.2 mg total) by mouth at bedtime.   desonide (DESOWEN) 0.05 % cream Apply as directed to affected area. (Patient taking differently: Apply 1 Application topically 2 (two) times daily.)   Dulaglutide (TRULICITY) 3 MG/0.5ML SOPN Inject 3 mg as directed once a week.   econazole nitrate 1 % cream Apply 1 Application topically as needed (skin irritation).   estradiol (ESTRACE) 0.1 MG/GM vaginal cream Apply 0.5 grams vaginally nightly for 2 weeks, then apply 0.5 grams 2 times weekly (Patient taking differently: Place 1 Applicatorful vaginally once a week.)   fluticasone (FLONASE) 50 MCG/ACT nasal spray Place 2 sprays into both nostrils daily.   glucose blood (ACCU-CHEK GUIDE) test strip USE AS DIRECTED TO CHECK BLOOD GLUCOSE ONCE DAILY (Patient taking differently: 1 each by Other route as needed for other (GLucose check). USE AS DIRECTED TO CHECK BLOOD GLUCOSE ONCE DAILY)   losartan (COZAAR) 50 MG tablet Take 1 & 1/2 tablets (75 mg total) by mouth daily.   metFORMIN (GLUCOPHAGE) 500 MG tablet Take 1 tablet (500 mg total) by mouth 2 (two) times daily with a meal. (Patient taking differently: Take 500 mg by mouth daily with breakfast.)   mirabegron ER (MYRBETRIQ) 50 MG TB24 tablet Take 1 tablet (50 mg total) by mouth daily.   omega-3 acid ethyl esters (LOVAZA) 1 g  capsule Take 2 capsules (2 g total) by mouth 2 (two) times daily.   OXYCODONE HCL PO Take 1 tablet by mouth as needed (pain).   pantoprazole (PROTONIX) 40 MG tablet Take 1 tablet (40 mg total) by mouth 2 (two) times daily.   Polyethyl Glycol-Propyl Glycol (SYSTANE OP) Apply 1 tablet to eye daily.   PSYLLIUM PO Take 1 tablet by mouth daily.   simvastatin (ZOCOR) 20 MG tablet Take 1 tablet (20 mg total) by mouth at bedtime.   solifenacin (VESICARE) 10 MG tablet Take 1 tablet (10 mg total) by mouth daily. *Need appointment for future refills.*   Vitamin D, Ergocalciferol, (DRISDOL) 1.25 MG (50000 UNIT) CAPS capsule Take 1  capsule (50,000 Units total) by mouth every 7 (seven) days.   [DISCONTINUED] cycloSPORINE (RESTASIS) 0.05 % ophthalmic emulsion Place 1 drop into both eyes 2 (two) times daily.   [DISCONTINUED] desmopressin (DDAVP) 0.2 MG tablet Take 1 tablet by mouth nightly (Patient taking differently: Take 0.2 mg by mouth at bedtime.)   [DISCONTINUED] estradiol (ESTRACE) 0.1 MG/GM vaginal cream Insert 0.5 grams nightly for 2 weeks, then 0.5 grams twice weekly.   [DISCONTINUED] omeprazole (PRILOSEC) 40 MG capsule Take 1 capsule (40 mg total) by mouth daily.   [DISCONTINUED] sulfamethoxazole-trimethoprim (BACTRIM DS) 800-160 MG tablet Take 1 tablet by mouth 2 (two) times daily.     Allergies:   Latex, Ciprofloxacin, Metronidazole, and Tape   Social History   Socioeconomic History   Marital status: Married    Spouse name: Veralynn Skubic   Number of children: 1   Years of education: Not on file   Highest education level: Not on file  Occupational History   Occupation: Retired Runner, broadcasting/film/video  Tobacco Use   Smoking status: Never    Passive exposure: Never   Smokeless tobacco: Never  Vaping Use   Vaping status: Never Used  Substance and Sexual Activity   Alcohol use: Yes    Alcohol/week: 2.0 - 3.0 standard drinks of alcohol    Types: 2 - 3 Glasses of wine per week    Comment: couple of  glasses per week   Drug use: No   Sexual activity: Not Currently  Other Topics Concern   Not on file  Social History Narrative   Not on file   Social Determinants of Health   Financial Resource Strain: Low Risk  (01/03/2023)   Overall Financial Resource Strain (CARDIA)    Difficulty of Paying Living Expenses: Not hard at all  Food Insecurity: No Food Insecurity (01/03/2023)   Hunger Vital Sign    Worried About Running Out of Food in the Last Year: Never true    Ran Out of Food in the Last Year: Never true  Transportation Needs: No Transportation Needs (01/03/2023)   PRAPARE - Administrator, Civil Service (Medical): No    Lack of Transportation (Non-Medical): No  Physical Activity: Sufficiently Active (01/03/2023)   Exercise Vital Sign    Days of Exercise per Week: 3 days    Minutes of Exercise per Session: 60 min  Stress: No Stress Concern Present (01/03/2023)   [employer] of Occupational Health - Occupational Stress Questionnaire    Feeling of Stress : Not at all  Social Connections: Socially Integrated (01/03/2023)   Social Connection and Isolation Panel [NHANES]    Frequency of Communication with Friends and Family: More than three times a week    Frequency of Social Gatherings with Friends and Family: More than three times a week    Attends Religious Services: More than 4 times per year    Active Member of [employer] or Organizations: Yes    Attends Engineer, structural: More than 4 times per year    Marital Status: Married     Family History: The patient's family history includes Heart disease in her father and mother; Hyperlipidemia in her mother; Hypertension in her father and mother; Stroke in her mother; Thyroid disease in her mother. ROS:   Please see the history of present illness.    All 14 point review of systems negative except as described per history of present illness  EKGs/Labs/Other Studies Reviewed:         Recent  Labs: 04/11/2022: ALT 13 10/10/2022: BUN 16; Creatinine, Ser 0.62; Hemoglobin 12.9; Platelets 281.0; Potassium 4.3; Sodium 137  Recent Lipid Panel    Component Value Date/Time   CHOL 157 10/10/2022 0839   CHOL 140 02/25/2022 0822   TRIG 219.0 (H) 10/10/2022 0839   HDL 39.10 10/10/2022 0839   HDL 44 02/25/2022 0822   CHOLHDL 4 10/10/2022 0839   VLDL 43.8 (H) 10/10/2022 0839   LDLCALC 73 02/25/2022 0822   LDLCALC 117 (H) 10/16/2019 1025   LDLDIRECT 100.0 10/10/2022 0839    Physical Exam:    VS:  BP (!) 140/72 (BP Location: Left Arm, Patient Position: Sitting)   Pulse 74   Ht 5\' 1"  (1.549 m)   Wt 236 lb (107 kg)   SpO2 94%   BMI 44.59 kg/m     Wt Readings from Last 3 Encounters:  01/03/23 236 lb (107 kg)  01/03/23 238 lb (108 kg)  10/10/22 239 lb (108.4 kg)     GEN:  Well nourished, well developed in no acute distress HEENT: Normal NECK: No JVD; No carotid bruits LYMPHATICS: No lymphadenopathy CARDIAC: RRR, systolic extra murmur grade 2/6 best heard right upper portion of the sternum, S2 still present, no rubs, no gallops RESPIRATORY:  Clear to auscultation without rales, wheezing or rhonchi  ABDOMEN: Soft, non-tender, non-distended MUSCULOSKELETAL:  No edema; No deformity  SKIN: Warm and dry LOWER EXTREMITIES: no swelling NEUROLOGIC:  Alert and oriented x 3 PSYCHIATRIC:  Normal affect   ASSESSMENT:    1. Elevated coronary artery calcium score   2. Nonrheumatic aortic valve stenosis   3. OSA (obstructive sleep apnea)   4. Controlled type 2 diabetes mellitus without complication, without long-term current use of insulin (HCC)   5. Mixed hyperlipidemia    PLAN:    In order of problems listed above:  Elevated coronary calcium score.  Asymptomatic, stress test earlier this year negative we will continue monitoring. Nonrheumatic arctic valve stenosis, not critical only moderate we will continue monitoring.  1 more time To her with the signs and symptoms of  significant valve problem meaning dizziness passing out chest pain shortness of breath she will let us know if she develop those. Obstructive sleep apnea CPAP mask doing well. Diabetes followed by internal medicine plan team well-controlled hemoglobin A1c 6.4. Dyslipidemia total cholesterol 157 HDL 39 continue present management continue dual antiplatelet therapy.   Medication Adjustments/Labs and Tests Ordered: Current medicines are reviewed at length with the patient today.  Concerns regarding medicines are outlined above.  No orders of the defined types were placed in this encounter.  Medication changes: No orders of the defined types were placed in this encounter.   Signed, Georgeanna Lea, MD, [facility] 01/03/2023 9:21 AM    [facility]

## 2023-01-03 NOTE — Patient Instructions (Signed)

## 2023-01-03 NOTE — Patient Instructions (Signed)
Jasmin [name] , Thank you for taking time to come for your Medicare Wellness Visit. I appreciate your ongoing commitment to your health goals. Please review the following plan we discussed and let me know if I can assist you in the future.     This is a list of the screening recommended for you and due dates:  Health Maintenance  Topic Date Due   COVID-19 Vaccine (9 - 2023-24 season) 12/26/2022   Hemoglobin A1C  04/12/2023   Eye exam for diabetics  04/20/2023   Complete foot exam   05/23/2023   Yearly kidney function blood test for diabetes  10/10/2023   Yearly kidney health urinalysis for diabetes  10/10/2023   Medicare Annual Wellness Visit  01/03/2024   DTaP/Tdap/Td vaccine (3 - Td or Tdap) 10/20/2030   Pneumonia Vaccine  Completed   Flu Shot  Completed   DEXA scan (bone density measurement)  Completed   Hepatitis C Screening  Completed   Zoster (Shingles) Vaccine  Completed   HPV Vaccine  Aged Out   Colon Cancer Screening  Discontinued    Next appointment: Follow up in one year for your annual wellness visit.   Preventive Care 27 Years and Older, Female Preventive care refers to lifestyle choices and visits with your health care provider that can promote health and wellness. What does preventive care include? A yearly physical exam. This is also called an annual well check. Dental exams once or twice a year. Routine eye exams. Ask your health care provider how often you should have your eyes checked. Personal lifestyle choices, including: Daily care of your teeth and gums. Regular physical activity. Eating a healthy diet. Avoiding tobacco and drug use. Limiting alcohol use. Practicing safe sex. Taking low-dose aspirin every day. Taking vitamin and mineral supplements as recommended by your health care provider. What happens during an annual well check? The services and screenings done by your health care provider during your annual well check will depend on your age,  overall health, lifestyle risk factors, and family history of disease. Counseling  Your health care provider may ask you questions about your: Alcohol use. Tobacco use. Drug use. Emotional well-being. Home and relationship well-being. Sexual activity. Eating habits. History of falls. Memory and ability to understand (cognition). Work and work Astronomer. Reproductive health. Screening  You may have the following tests or measurements: Height, weight, and BMI. Blood pressure. Lipid and cholesterol levels. These may be checked every 5 years, or more frequently if you are over 79 years old. Skin check. Lung cancer screening. You may have this screening every year starting at age 64 if you have a 30-pack-year history of smoking and currently smoke or have quit within the past 15 years. Fecal occult blood test (FOBT) of the stool. You may have this test every year starting at age 56. Flexible sigmoidoscopy or colonoscopy. You may have a sigmoidoscopy every 5 years or a colonoscopy every 10 years starting at age 72. Hepatitis C blood test. Hepatitis B blood test. Sexually transmitted disease (STD) testing. Diabetes screening. This is done by checking your blood sugar (glucose) after you have not eaten for a while (fasting). You may have this done every 1-3 years. Bone density scan. This is done to screen for osteoporosis. You may have this done starting at age 33. Mammogram. This may be done every 1-2 years. Talk to your health care provider about how often you should have regular mammograms. Talk with your health care provider about your test  results, treatment options, and if necessary, the need for more tests. Vaccines  Your health care provider may recommend certain vaccines, such as: Influenza vaccine. This is recommended every year. Tetanus, diphtheria, and acellular pertussis (Tdap, Td) vaccine. You may need a Td booster every 10 years. Zoster vaccine. You may need this after age  62. Pneumococcal 13-valent conjugate (PCV13) vaccine. One dose is recommended after age 63. Pneumococcal polysaccharide (PPSV23) vaccine. One dose is recommended after age 33. Talk to your health care provider about which screenings and vaccines you need and how often you need them. This information is not intended to replace advice given to you by your health care provider. Make sure you discuss any questions you have with your health care provider. Document Released: 03/06/2015 Document Revised: 10/28/2015 Document Reviewed: 12/09/2014 Elsevier Interactive Patient Education  2017 [employer].  Fall Prevention in the Home Falls can cause injuries. They can happen to people of all ages. There are many things you can do to make your home safe and to help prevent falls. What can I do on the outside of my home? Regularly fix the edges of walkways and driveways and fix any cracks. Remove anything that might make you trip as you walk through a door, such as a raised step or threshold. Trim any bushes or trees on the path to your home. Use bright outdoor lighting. Clear any walking paths of anything that might make someone trip, such as rocks or tools. Regularly check to see if handrails are loose or broken. Make sure that both sides of any steps have handrails. Any raised decks and porches should have guardrails on the edges. Have any leaves, snow, or ice cleared regularly. Use sand or salt on walking paths during winter. Clean up any spills in your garage right away. This includes oil or grease spills. What can I do in the bathroom? Use night lights. Install grab bars by the toilet and in the tub and shower. Do not use towel bars as grab bars. Use non-skid mats or decals in the tub or shower. If you need to sit down in the shower, use a plastic, non-slip stool. Keep the floor dry. Clean up any water that spills on the floor as soon as it happens. Remove soap buildup in the tub or shower  regularly. Attach bath mats securely with double-sided non-slip rug tape. Do not have throw rugs and other things on the floor that can make you trip. What can I do in the bedroom? Use night lights. Make sure that you have a light by your bed that is easy to reach. Do not use any sheets or blankets that are too big for your bed. They should not hang down onto the floor. Have a firm chair that has side arms. You can use this for support while you get dressed. Do not have throw rugs and other things on the floor that can make you trip. What can I do in the kitchen? Clean up any spills right away. Avoid walking on wet floors. Keep items that you use a lot in easy-to-reach places. If you need to reach something above you, use a strong step stool that has a grab bar. Keep electrical cords out of the way. Do not use floor polish or wax that makes floors slippery. If you must use wax, use non-skid floor wax. Do not have throw rugs and other things on the floor that can make you trip. What can I do with my stairs?  Do not leave any items on the stairs. Make sure that there are handrails on both sides of the stairs and use them. Fix handrails that are broken or loose. Make sure that handrails are as long as the stairways. Check any carpeting to make sure that it is firmly attached to the stairs. Fix any carpet that is loose or worn. Avoid having throw rugs at the top or bottom of the stairs. If you do have throw rugs, attach them to the floor with carpet tape. Make sure that you have a light switch at the top of the stairs and the bottom of the stairs. If you do not have them, ask someone to add them for you. What else can I do to help prevent falls? Wear shoes that: Do not have high heels. Have rubber bottoms. Are comfortable and fit you well. Are closed at the toe. Do not wear sandals. If you use a stepladder: Make sure that it is fully opened. Do not climb a closed stepladder. Make sure that  both sides of the stepladder are locked into place. Ask someone to hold it for you, if possible. Clearly mark and make sure that you can see: Any grab bars or handrails. First and last steps. Where the edge of each step is. Use tools that help you move around (mobility aids) if they are needed. These include: Canes. Walkers. Scooters. Crutches. Turn on the lights when you go into a dark area. Replace any light bulbs as soon as they burn out. Set up your furniture so you have a clear path. Avoid moving your furniture around. If any of your floors are uneven, fix them. If there are any pets around you, be aware of where they are. Review your medicines with your doctor. Some medicines can make you feel dizzy. This can increase your chance of falling. Ask your doctor what other things that you can do to help prevent falls. This information is not intended to replace advice given to you by your health care provider. Make sure you discuss any questions you have with your health care provider. Document Released: 12/04/2008 Document Revised: 07/16/2015 Document Reviewed: 03/14/2014 Elsevier Interactive Patient Education  2017 [employer].

## 2023-01-03 NOTE — Progress Notes (Signed)
Subjective:   Jasmin [name] is a 77 y.o. female who presents for Medicare Annual (Subsequent) preventive examination.  Visit Complete: In person   Cardiac Risk Factors include: advanced age (>13men, >55 women);diabetes mellitus;dyslipidemia;hypertension;obesity (BMI >30kg/m2)     Objective:    Today's Vitals   01/03/23 0814 01/03/23 0834  BP: (!) 151/41 (!) 152/45  Pulse: 88 73  SpO2: 99%   Weight: 238 lb (108 kg)   Height: 5\' 1"  (1.549 m)    Body mass index is 44.97 kg/m.     01/03/2023    8:21 AM 12/29/2021    8:27 AM 10/19/2020    9:35 AM 08/14/2018    8:17 AM 05/25/2017    9:22 AM 10/02/2016    9:40 AM 09/24/2015    7:58 AM  Advanced Directives  Does Patient Have a Medical Advance Directive? Yes Yes Yes Yes Yes No Yes  Type of Estate agent of [place];Living will Healthcare Power of [place];Living will Healthcare Power of [place];Living will Healthcare Power of [place];Living will Healthcare Power of [place];Living will  Living will;Healthcare Power of Attorney  Does patient want to make changes to medical advance directive? No - Patient declined No - Patient declined  No - Patient declined     Copy of Healthcare Power of Attorney in Chart? No - copy requested No - copy requested No - copy requested No - copy requested No - copy requested    Would patient like information on creating a medical advance directive?      Yes (ED - Information included in AVS)     Current Medications (verified) Outpatient Encounter Medications as of 01/03/2023  Medication Sig   Accu-Chek Softclix Lancets lancets USE AS DIRECTED TO CHECK BLOOD GLUCOSE ONCE DAILY (Patient taking differently: 1 each by Other route See admin instructions. USE AS DIRECTED TO CHECK BLOOD GLUCOSE ONCE DAILY)   Acetaminophen (TYLENOL ARTHRITIS PAIN PO) Take 650 mg by mouth every 8 (eight) hours as needed (PAIN).   Ascorbic Acid (VITAMIN C WITH ROSE HIPS) 500 MG tablet Take 500 mg by mouth  daily.   aspirin EC 81 MG tablet Take 1 tablet (81 mg total) by mouth daily. Swallow whole.   blood glucose meter kit and supplies KIT Dispense based on patient and insurance preference. Use once daily to monitor glucose as needed (FOR ICD-9 250.00, 250.01). (Patient taking differently: Inject 1 each into the skin as directed. Dispense based on patient and insurance preference. Use once daily to monitor glucose as needed (FOR ICD-9 250.00, 250.01).)   CALCIUM PO Take 1 tablet by mouth daily.   Cholecalciferol (VITAMIN D3) 2000 units TABS Take 1 tablet by mouth in the morning. Take one tablet in the AM   clopidogrel (PLAVIX) 75 MG tablet Take 1 tablet (75 mg total) by mouth daily.   clotrimazole-betamethasone (LOTRISONE) cream Apply to plantar foot daily (Patient taking differently: Apply 1 Application topically 2 (two) times daily.)   cycloSPORINE (RESTASIS) 0.05 % ophthalmic emulsion Place 1 drop into both eyes 2 (two) times daily.   desmopressin (DDAVP) 0.2 MG tablet Take 1 tablet (0.2 mg total) by mouth at bedtime.   desonide (DESOWEN) 0.05 % cream Apply as directed to affected area. (Patient taking differently: Apply 1 Application topically 2 (two) times daily.)   Dulaglutide (TRULICITY) 3 MG/0.5ML SOPN Inject 3 mg as directed once a week.   econazole nitrate 1 % cream Apply 1 Application topically as needed (skin irritation).   estradiol (ESTRACE) 0.1 MG/GM  vaginal cream Apply 0.5 grams vaginally nightly for 2 weeks, then apply 0.5 grams 2 times weekly (Patient taking differently: Place 1 Applicatorful vaginally once a week.)   fluticasone (FLONASE) 50 MCG/ACT nasal spray Place 2 sprays into both nostrils daily.   glucose blood (ACCU-CHEK GUIDE) test strip USE AS DIRECTED TO CHECK BLOOD GLUCOSE ONCE DAILY (Patient taking differently: 1 each by Other route as needed for other (GLucose check). USE AS DIRECTED TO CHECK BLOOD GLUCOSE ONCE DAILY)   losartan (COZAAR) 50 MG tablet Take 1 & 1/2 tablets  (75 mg total) by mouth daily.   metFORMIN (GLUCOPHAGE) 500 MG tablet Take 1 tablet (500 mg total) by mouth 2 (two) times daily with a meal.   mirabegron ER (MYRBETRIQ) 50 MG TB24 tablet Take 1 tablet (50 mg total) by mouth daily.   omega-3 acid ethyl esters (LOVAZA) 1 g capsule Take 2 capsules (2 g total) by mouth 2 (two) times daily.   OXYCODONE HCL PO Take 1 tablet by mouth as needed (pain).   pantoprazole (PROTONIX) 40 MG tablet Take 1 tablet (40 mg total) by mouth 2 (two) times daily.   Polyethyl Glycol-Propyl Glycol (SYSTANE OP) Apply 1 tablet to eye daily.   PSYLLIUM PO Take 1 tablet by mouth daily.   simvastatin (ZOCOR) 20 MG tablet Take 1 tablet (20 mg total) by mouth at bedtime.   solifenacin (VESICARE) 10 MG tablet Take 1 tablet (10 mg total) by mouth daily. *Need appointment for future refills.*   Vitamin D, Ergocalciferol, (DRISDOL) 1.25 MG (50000 UNIT) CAPS capsule Take 1 capsule (50,000 Units total) by mouth every 7 (seven) days.   [DISCONTINUED] cycloSPORINE (RESTASIS) 0.05 % ophthalmic emulsion Place 1 drop into both eyes 2 (two) times daily.   [DISCONTINUED] desmopressin (DDAVP) 0.2 MG tablet Take 1 tablet by mouth nightly (Patient taking differently: Take 0.2 mg by mouth at bedtime.)   [DISCONTINUED] estradiol (ESTRACE) 0.1 MG/GM vaginal cream Insert 0.5 grams nightly for 2 weeks, then 0.5 grams twice weekly.   [DISCONTINUED] omeprazole (PRILOSEC) 40 MG capsule Take 1 capsule (40 mg total) by mouth daily.   [DISCONTINUED] sulfamethoxazole-trimethoprim (BACTRIM DS) 800-160 MG tablet Take 1 tablet by mouth 2 (two) times daily.   No facility-administered encounter medications on file as of 01/03/2023.    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   History of CVA (cerebrovascular accident) 08/08/2016   Seen on MRI from 08/2015   Hyperlipidemia    Hypertension    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   IBS (irritable bowel syndrome) 07/16/2015   Insomnia 07/16/2015   Joint pain    Knee pain    Left ventricular hypertrophy 07/16/2015   Leg cramping    right leg   Mixed hyperlipidemia 08/08/2016   Morbid obesity (HCC) 11/10/2015   Nasal discharge    Nuclear sclerotic cataract of left eye 07/07/2016   OAB (overactive bladder) 01/20/2016  OSA (obstructive sleep apnea) 03/09/2015   Osteoporosis 03/09/2015   Primary osteoarthritis of left knee 03/31/2015   Pulmonary hypertension (HCC) 07/16/2015   Red eyes    Right kidney stone    Shortness of breath on exertion    Skin cancer 11/04/2020   Sleep apnea    Stroke (HCC)    Swelling of both lower extremities    UTI (urinary tract infection)    Vitamin D deficiency 03/09/2015   Past Surgical History:  Procedure Laterality Date   ABDOMINAL HYSTERECTOMY  09/14/2016   CATARACT EXTRACTION Left    CATARACT EXTRACTION Left 06/2016   COLPORRHAPHY N/A 09/14/2016   [facility]   CYSTOCELE REPAIR     FRACTURE SURGERY Left 1953   L arm   FRACTURE SURGERY Left 1985   L ankle   HERNIA REPAIR  2004   JOINT REPLACEMENT Right 2012   R TKA   JOINT REPLACEMENT Left 2017   KIDNEY STONE SURGERY     LAPAROSCOPIC ASSISTED VAGINAL HYSTERECTOMY N/A 09/14/2016   [facility]   R foot/ankle Right 2013 and 2014   plate and screws lateral foot and tendon removal medial foot in 2013, revision in 2014   RECTOCELE REPAIR     URETERAL STENT PLACEMENT     URETEROSCOPY     Family History   Problem Relation Age of Onset   Hyperlipidemia Mother    Heart disease Mother    Hypertension Mother    Stroke Mother    Thyroid disease Mother    Hypertension Father    Heart disease Father    Social History   Socioeconomic History   Marital status: Married    Spouse name: Verdene Emanuelson   Number of children: 1   Years of education: Not on file   Highest education level: Not on file  Occupational History   Occupation: Retired Runner, broadcasting/film/video  Tobacco Use   Smoking status: Never    Passive exposure: Never   Smokeless tobacco: Never  Vaping Use   Vaping status: Never Used  Substance and Sexual Activity   Alcohol use: Yes    Alcohol/week: 2.0 - 3.0 standard drinks of alcohol    Types: 2 - 3 Glasses of wine per week    Comment: couple of glasses per week   Drug use: No   Sexual activity: Not Currently  Other Topics Concern   Not on file  Social History Narrative   Not on file   Social Determinants of Health   Financial Resource Strain: Low Risk  (01/03/2023)   Overall Financial Resource Strain (CARDIA)    Difficulty of Paying Living Expenses: Not hard at all  Food Insecurity: No Food Insecurity (01/03/2023)   Hunger Vital Sign    Worried About Running Out of Food in the Last Year: Never true    Ran Out of Food in the Last Year: Never true  Transportation Needs: No Transportation Needs (01/03/2023)   PRAPARE - Administrator, Civil Service (Medical): No    Lack of Transportation (Non-Medical): No  Physical Activity: Sufficiently Active (01/03/2023)   Exercise Vital Sign    Days of Exercise per Week: 3 days    Minutes of Exercise per Session: 60 min  Stress: No Stress Concern Present (01/03/2023)   [employer] of Occupational Health - Occupational Stress Questionnaire    Feeling of Stress : Not at all  Social Connections: Socially Integrated (01/03/2023)   Social Connection and Isolation Panel [NHANES]    Frequency of  Communication with Friends and  Family: More than three times a week    Frequency of Social Gatherings with Friends and Family: More than three times a week    Attends Religious Services: More than 4 times per year    Active Member of [employer] or Organizations: Yes    Attends Engineer, structural: More than 4 times per year    Marital Status: Married    Tobacco Counseling Counseling given: Not Answered   Clinical Intake:  Pre-visit preparation completed: Yes  Pain : No/denies pain     BMI - recorded: 44.97 Nutritional Status: BMI > 30  Obese Nutritional Risks: None Diabetes: Yes CBG done?: No Did pt. bring in CBG monitor from home?: No  How often do you need to have someone help you when you read instructions, pamphlets, or other written materials from your doctor or pharmacy?: 1 - Never  Interpreter Needed?: No  Information entered by :: Donne Anon, CMA   Activities of Daily Living    01/03/2023    8:22 AM  In your present state of health, do you have any difficulty performing the following activities:  Hearing? 1  Comment wears hearing aids  Vision? 0  Difficulty concentrating or making decisions? 0  Walking or climbing stairs? 0  Dressing or bathing? 0  Doing errands, shopping? 0  Preparing Food and eating ? N  Using the Toilet? N  In the past six months, have you accidently leaked urine? N  Do you have problems with loss of bowel control? N  Managing your Medications? N  Managing your Finances? N  Housekeeping or managing your Housekeeping? N    Patient Care Team: Copland, Gwenlyn Found, MD as PCP - General (Family Medicine) Marisue Brooklyn, MD as Consulting Physician (Pulmonary Disease) Blenda Bridegroom, MD as Consulting Physician (Orthopedic Surgery) Hollar, Ronal Fear, MD as Consulting Physician (Dermatology) Marlaine Hind, MD as Consulting Physician (Sports Medicine) Puschinsky, Adelfa Koh., MD as Consulting Physician (General Surgery) Pill, Sharlyne Pacas, MD as  Consulting Physician (Orthopedic Surgery) Barbarann Ehlers., DDS as Consulting Physician (Dentistry) Reynold Bowen., MD (Obstetrics and Gynecology) Jerrell Mylar, MD as Referring Physician (Gynecology)  Indicate any recent Medical Services you may have received from other than [facility] providers in the past year (date may be approximate).     Assessment:   This is a routine wellness examination for Coralyn.  Hearing/Vision screen No results found.   Goals Addressed   None    Depression Screen    01/03/2023    8:27 AM 05/23/2022    8:18 AM 04/11/2022    8:17 AM 12/29/2021    8:35 AM 04/21/2021    8:55 AM 10/19/2020    9:38 AM 10/19/2020    8:58 AM  PHQ 2/9 Scores  PHQ - 2 Score 0 0 0 1 0 0 0  Exception Documentation     Medical reason      Fall Risk    01/03/2023    8:26 AM 05/23/2022    8:18 AM 04/11/2022    8:17 AM 12/29/2021    8:34 AM 04/21/2021    8:55 AM  Fall Risk   Falls in the past year? 0 0 0 0 0  Number falls in past yr: 0 0 0 0 0  Injury with Fall? 0 0 0 0 0  Risk for fall due to : No Fall Risks No Fall Risks No Fall Risks No Fall Risks   Follow up Falls evaluation  completed Falls evaluation completed Falls evaluation completed Falls evaluation completed     MEDICARE RISK AT HOME: Medicare Risk at Home Any stairs in or around the home?: Yes If so, are there any without handrails?: No Home free of loose throw rugs in walkways, pet beds, electrical cords, etc?: Yes Adequate lighting in your home to reduce risk of falls?: Yes Life alert?: No Use of a cane, walker or w/c?: No Grab bars in the bathroom?: Yes Shower chair or bench in shower?: No Elevated toilet seat or a handicapped toilet?: No  TIMED UP AND GO:  Was the test performed?  Yes  Length of time to ambulate 10 feet: 7 sec Gait steady and fast without use of assistive device    Cognitive Function:    05/25/2017    9:31 AM  MMSE - Mini Mental State Exam  Orientation to time 5  Orientation  to Place 5  Registration 3  Attention/ Calculation 5  Recall 3  Language- name 2 objects 2  Language- repeat 1  Language- follow 3 step command 3  Language- read & follow direction 1  Write a sentence 1  Copy design 1  Total score 30        01/03/2023    8:32 AM 12/29/2021    8:40 AM  6CIT Screen  What Year? 0 points 0 points  What month? 0 points 0 points  What time? 0 points 0 points  Count back from 20 0 points 0 points  Months in reverse 0 points 0 points  Repeat phrase 2 points 2 points  Total Score 2 points 2 points    Immunizations Immunization History  Administered Date(s) Administered   Fluad Quad(high Dose 65+) 10/23/2018   Fluad Trivalent(High Dose 65+) 10/26/2022   Influenza Split 11/17/2014, 10/31/2021   Influenza, High Dose Seasonal PF 12/08/2015, 11/23/2016, 11/06/2017, 10/19/2020   Influenza,inj,Quad PF,6+ Mos 11/17/2014   Influenza,inj,quad, With Preservative 11/17/2014   Influenza-Unspecified 12/11/2012, 12/08/2015, 10/31/2019   PFIZER(Purple Top)SARS-COV-2 Vaccination 03/12/2019, 04/02/2019, 12/02/2019, 05/21/2020   Pfizer Covid-19 Vaccine Bivalent Booster 19yrs & up 11/26/2020, 07/26/2021   Pfizer(Comirnaty)Fall Seasonal Vaccine 12 years and older 11/14/2021, 10/31/2022   Pneumococcal Conjugate-13 10/02/2013   Pneumococcal Polysaccharide-23 02/03/2010, 10/22/2012, 09/21/2013, 10/02/2013, 12/31/2018   Respiratory Syncytial Virus Vaccine,Recomb Aduvanted(Arexvy) 12/09/2021   Tdap 11/10/2010, 10/19/2020   Unspecified SARS-COV-2 Vaccination 11/14/2021   Zoster Recombinant(Shingrix) 08/01/2017, 10/31/2017   Zoster, Live 05/04/2010    TDAP status: Up to date  Flu Vaccine status: Up to date  Pneumococcal vaccine status: Up to date  Covid-19 vaccine status: Information provided on how to obtain vaccines.   Qualifies for Shingles Vaccine? Yes   Zostavax completed Yes   Shingrix Completed?: Yes  Screening Tests Health Maintenance  Topic Date Due    COVID-19 Vaccine (9 - 2023-24 season) 12/26/2022   Medicare Annual Wellness (AWV)  12/30/2022   HEMOGLOBIN A1C  04/12/2023   OPHTHALMOLOGY EXAM  04/20/2023   FOOT EXAM  05/23/2023   Diabetic kidney evaluation - eGFR measurement  10/10/2023   Diabetic kidney evaluation - Urine ACR  10/10/2023   DTaP/Tdap/Td (3 - Td or Tdap) 10/20/2030   Pneumonia Vaccine 53+ Years old  Completed   INFLUENZA VACCINE  Completed   DEXA SCAN  Completed   Hepatitis C Screening  Completed   Zoster Vaccines- Shingrix  Completed   HPV VACCINES  Aged Out   Colonoscopy  Discontinued    Health Maintenance  Health Maintenance Due  Topic Date Due  COVID-19 Vaccine (9 - 2023-24 season) 12/26/2022   Medicare Annual Wellness (AWV)  12/30/2022    Colorectal cancer screening: No longer required.   Mammogram status: Completed 06/06/22. Repeat every year  Bone Density status: Completed 05/23/22. Results reflect: Bone density results: OSTEOPENIA. Repeat every 2 years.  Lung Cancer Screening: (Low Dose CT Chest recommended if Age 72-80 years, 20 pack-year currently smoking OR have quit w/in 15years.) does not qualify.   Additional Screening:  Hepatitis C Screening: does qualify; Completed 12/14/16  Vision Screening: Recommended annual ophthalmology exams for early detection of glaucoma and other disorders of the eye. Is the patient up to date with their annual eye exam?  Yes  Who is the provider or what is the name of the office in which the patient attends annual eye exams? Dr.Forsey If pt is not established with a provider, would they like to be referred to a provider to establish care? No .   Dental Screening: Recommended annual dental exams for proper oral hygiene  Diabetic Foot Exam: Diabetic Foot Exam: Completed 05/23/23  Community Resource Referral / Chronic Care Management: CRR required this visit?  No   CCM required this visit?  No     Plan:     I have personally reviewed and noted the  following in the patient's chart:   Medical and social history Use of alcohol, tobacco or illicit drugs  Current medications and supplements including opioid prescriptions. Patient is currently taking opioid prescriptions. Information provided to patient regarding non-opioid alternatives. Patient advised to discuss non-opioid treatment plan with their provider. Functional ability and status Nutritional status Physical activity Advanced directives List of other physicians Hospitalizations, surgeries, and ER visits in previous 12 months Vitals Screenings to include cognitive, depression, and falls Referrals and appointments  In addition, I have reviewed and discussed with patient certain preventive protocols, quality metrics, and best practice recommendations. A written personalized care plan for preventive services as well as general preventive health recommendations were provided to patient.     Donne Anon, CMA   01/03/2023   After Visit Summary: (In Person-Declined) Patient declined AVS at this time.  Nurse Notes: None

## 2023-01-09 DIAGNOSIS — G4733 Obstructive sleep apnea (adult) (pediatric): Secondary | ICD-10-CM | POA: Diagnosis not present

## 2023-01-10 ENCOUNTER — Other Ambulatory Visit (HOSPITAL_BASED_OUTPATIENT_CLINIC_OR_DEPARTMENT_OTHER): Payer: Self-pay

## 2023-01-10 ENCOUNTER — Other Ambulatory Visit: Payer: Self-pay | Admitting: Family Medicine

## 2023-01-10 ENCOUNTER — Other Ambulatory Visit (HOSPITAL_COMMUNITY): Payer: Self-pay

## 2023-01-10 MED ORDER — GLUCOSE BLOOD VI STRP
1.0000 | ORAL_STRIP | Freq: Every day | 12 refills | Status: DC
  Filled 2023-01-10: qty 100, 90d supply, fill #0
  Filled 2023-04-06: qty 100, 90d supply, fill #1
  Filled 2023-07-10: qty 100, 90d supply, fill #2
  Filled 2023-10-04: qty 100, 90d supply, fill #3
  Filled 2023-12-30: qty 100, 90d supply, fill #4

## 2023-01-11 ENCOUNTER — Other Ambulatory Visit (HOSPITAL_BASED_OUTPATIENT_CLINIC_OR_DEPARTMENT_OTHER): Payer: Self-pay

## 2023-01-11 MED ORDER — SOLIFENACIN SUCCINATE 10 MG PO TABS
10.0000 mg | ORAL_TABLET | Freq: Every day | ORAL | 3 refills | Status: DC
  Filled 2023-01-11 – 2023-02-13 (×2): qty 30, 30d supply, fill #0
  Filled 2023-03-10: qty 30, 30d supply, fill #1
  Filled 2023-04-06: qty 30, 30d supply, fill #2
  Filled 2023-05-09: qty 30, 30d supply, fill #3

## 2023-01-11 MED ORDER — SOLIFENACIN SUCCINATE 10 MG PO TABS
10.0000 mg | ORAL_TABLET | Freq: Every day | ORAL | 0 refills | Status: DC
  Filled 2023-01-11: qty 30, 30d supply, fill #0

## 2023-01-13 ENCOUNTER — Other Ambulatory Visit (HOSPITAL_BASED_OUTPATIENT_CLINIC_OR_DEPARTMENT_OTHER): Payer: Self-pay

## 2023-01-16 ENCOUNTER — Other Ambulatory Visit (HOSPITAL_BASED_OUTPATIENT_CLINIC_OR_DEPARTMENT_OTHER): Payer: Self-pay

## 2023-01-16 ENCOUNTER — Other Ambulatory Visit: Payer: Self-pay

## 2023-01-17 ENCOUNTER — Other Ambulatory Visit (HOSPITAL_BASED_OUTPATIENT_CLINIC_OR_DEPARTMENT_OTHER): Payer: Self-pay

## 2023-01-23 DIAGNOSIS — Z471 Aftercare following joint replacement surgery: Secondary | ICD-10-CM | POA: Diagnosis not present

## 2023-01-23 DIAGNOSIS — Z96641 Presence of right artificial hip joint: Secondary | ICD-10-CM | POA: Diagnosis not present

## 2023-01-29 ENCOUNTER — Other Ambulatory Visit (HOSPITAL_BASED_OUTPATIENT_CLINIC_OR_DEPARTMENT_OTHER): Payer: Self-pay

## 2023-01-30 ENCOUNTER — Other Ambulatory Visit (HOSPITAL_BASED_OUTPATIENT_CLINIC_OR_DEPARTMENT_OTHER): Payer: Self-pay

## 2023-01-30 ENCOUNTER — Other Ambulatory Visit: Payer: Self-pay

## 2023-01-30 MED ORDER — DESONIDE 0.05 % EX CREA
TOPICAL_CREAM | CUTANEOUS | 0 refills | Status: DC
  Filled 2023-01-30: qty 15, 15d supply, fill #0

## 2023-02-02 DIAGNOSIS — S01512A Laceration without foreign body of oral cavity, initial encounter: Secondary | ICD-10-CM | POA: Diagnosis not present

## 2023-02-04 ENCOUNTER — Other Ambulatory Visit (HOSPITAL_BASED_OUTPATIENT_CLINIC_OR_DEPARTMENT_OTHER): Payer: Self-pay

## 2023-02-04 NOTE — Progress Notes (Unsigned)
[facility] [address] [place], [place] [address] [phone]  Date:  02/06/2023   Name:  Jasmin [name]   DOB:  Sep 14, 1945   MRN:  [id number]  PCP:  Pearline Cables, MD    Chief Complaint: UC follow up (Seen at [facility] on 02/02/23 for a tongue laceration. Started Thursday 02/02/23, pt had additional episodes on the evening of 12/12 and then 12/13. /)   History of Present Illness:  Jasmin [name] is a 77 y.o. very pleasant female patient who presents with the following:  Patient seen today for follow-up from a recent urgent care visit-she was seen in urgent care on 12/12 after biting her tongue.  It sounds like she may have cut her tongue somehow but she does not remember any injury.  She is taking Plavix and aspirin, had a fair amount of bleeding To get the bleeding to stop at urgent care, asked her to follow-up with me She did have some recurrence of bleeding later that same day and a bit the next day- none since then  No other unusual bleeding noted -no epistaxis, no blood in stool or urine.  She otherwise feels well and hopes to be able to go exercise today.  She does keep up with her dental visits, she had a crown about a month ago  I saw her most recently in August History of well-controlled diabetes, hypertension, hyperlipidemia, stroke, OSA, obesity, pulmonary hypertension, aortic stenosis She is followed by pulmonology, Dr. 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   SCC (squamous cell carcinoma), arm, left 03/16/2022   Obesity, Beginning BMI 46.86 03/15/2022   Elevated coronary artery calcium score 02/24/2022   Left sided sciatica 07/05/2021   Skin cancer 11/04/2020   BMI 45.0-49.9, adult (HCC) 07/31/2020   Swelling of both lower extremities    Shortness of breath on exertion    Red eyes    Knee pain    Joint pain    Decreased hearing    Back pain    Nocturia 12/26/2018   Recurrent UTI 12/26/2018   Vaginal atrophy 12/26/2018   Hyperopia with astigmatism and presbyopia, bilateral 02/09/2018   Long term current use of oral hypoglycemic drug 02/09/2018   PCO (posterior capsular opacification), left 02/09/2018   Pseudophakia of left eye 02/09/2018   PVD (posterior vitreous detachment), both eyes 02/09/2018   Arthritis 12/16/2016   Right kidney stone    Hyperlipidemia    Heart murmur    Diabetes mellitus without complication (HCC)    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   Cystocele with rectocele 08/21/2016   Mixed hyperlipidemia 08/08/2016   Controlled type 2 diabetes mellitus without complication, without long-term current use of insulin (HCC) 08/08/2016   Essential hypertension 08/08/2016   History of CVA (cerebrovascular accident) 08/08/2016   Nuclear sclerotic cataract of left eye 07/07/2016   OAB (overactive bladder) 01/20/2016   Class 3 severe obesity with  serious comorbidity and body mass index (BMI) of 45.0 to 49.9 in adult [facility]) 11/10/2015   IBS (irritable bowel syndrome) 07/16/2015   Insomnia 07/16/2015   Left ventricular hypertrophy 07/16/2015   Pulmonary hypertension (HCC) 07/16/2015   Primary osteoarthritis of left knee 03/31/2015   Acid reflux disease 03/09/2015   Aortic stenosis 03/09/2015   AR (allergic rhinitis) 03/09/2015   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   OSA (obstructive sleep apnea) 03/09/2015   Osteoporosis 03/09/2015   Vitamin D deficiency 03/09/2015   Left tibialis posterior  tendinitis 12/13/2013    Past Medical History:  Diagnosis Date   Acid reflux disease 03/09/2015   Aortic stenosis 03/09/2015   AR (allergic rhinitis) 03/09/2015   Arthritis    Back pain    Controlled type 2 diabetes mellitus without complication, without long-term current use of insulin (HCC) 08/08/2016   Cough    Cystocele with rectocele 08/21/2016   Decreased hearing    Diabetes mellitus without complication (HCC)    Dry mouth    Easy bruising    Essential hypertension 08/08/2016   Heart murmur    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   History of CVA (cerebrovascular accident) 08/08/2016   Seen on MRI from 08/2015   Hyperlipidemia    Hypertension    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   IBS (irritable bowel syndrome) 07/16/2015   Insomnia 07/16/2015   Joint pain    Knee pain    Left ventricular hypertrophy 07/16/2015   Leg cramping    right leg   Mixed hyperlipidemia 08/08/2016   Morbid obesity (HCC) 11/10/2015   Nasal discharge    Nuclear sclerotic cataract of left eye 07/07/2016   OAB (overactive bladder) 01/20/2016   OSA (obstructive sleep apnea) 03/09/2015   Osteoporosis 03/09/2015   Primary osteoarthritis of left knee 03/31/2015   Pulmonary hypertension (HCC) 07/16/2015   Red eyes    Right kidney stone    Shortness of breath on exertion    Skin cancer 11/04/2020   Sleep apnea    Stroke (HCC)    Swelling of both lower extremities    UTI (urinary tract infection)    Vitamin D deficiency 03/09/2015    Past Surgical History:  Procedure Laterality Date   ABDOMINAL HYSTERECTOMY  09/14/2016   CATARACT EXTRACTION Left    CATARACT EXTRACTION Left 06/2016   COLPORRHAPHY N/A 09/14/2016   [facility]   CYSTOCELE REPAIR     FRACTURE SURGERY Left 1953   L arm   FRACTURE SURGERY Left 1985   L ankle   HERNIA REPAIR  2004   JOINT REPLACEMENT Right 2012   R TKA   JOINT REPLACEMENT Left 2017   KIDNEY STONE SURGERY     LAPAROSCOPIC ASSISTED VAGINAL HYSTERECTOMY N/A 09/14/2016    [facility]   R foot/ankle Right 2013 and 2014   plate and screws lateral foot and tendon removal medial foot in 2013, revision in 2014   RECTOCELE REPAIR     URETERAL STENT PLACEMENT     URETEROSCOPY      Social History   Tobacco Use   Smoking status: Never    Passive exposure: Never   Smokeless tobacco: Never  Vaping Use   Vaping status: Never Used  Substance Use Topics   Alcohol use: Yes    Alcohol/week: 2.0 - 3.0 standard drinks of alcohol    Types: 2 - 3 Glasses of wine per week    Comment: couple of glasses per week   Drug use: No  Family History  Problem Relation Age of Onset   Hyperlipidemia Mother    Heart disease Mother    Hypertension Mother    Stroke Mother    Thyroid disease Mother    Hypertension Father    Heart disease Father     Allergies  Allergen Reactions   Latex Itching   Ciprofloxacin Hives   Metronidazole Hives   Tape Rash and Itching    Redness of skin    Medication list has been reviewed and updated.  Current Outpatient Medications on File Prior to Visit  Medication Sig Dispense Refill   Accu-Chek Softclix Lancets lancets USE AS DIRECTED TO CHECK BLOOD GLUCOSE ONCE DAILY (Patient taking differently: 1 each by Other route See admin instructions. USE AS DIRECTED TO CHECK BLOOD GLUCOSE ONCE DAILY) 100 each 3   Acetaminophen (TYLENOL ARTHRITIS PAIN PO) Take 650 mg by mouth every 8 (eight) hours as needed (PAIN).     Ascorbic Acid (VITAMIN C WITH ROSE HIPS) 500 MG tablet Take 500 mg by mouth daily.     aspirin EC 81 MG tablet Take 1 tablet (81 mg total) by mouth daily. Swallow whole. 120 tablet 3   blood glucose meter kit and supplies KIT Dispense based on patient and insurance preference. Use once daily to monitor glucose as needed (FOR ICD-9 250.00, 250.01). (Patient taking differently: Inject 1 each into the skin as directed. Dispense based on patient and insurance preference. Use once daily to monitor glucose as needed (FOR ICD-9 250.00, 250.01).)  1 each 0   CALCIUM PO Take 1 tablet by mouth daily.     Cholecalciferol (VITAMIN D3) 2000 units TABS Take 2 tablets by mouth in the morning. Take one tablet in the AM     clopidogrel (PLAVIX) 75 MG tablet Take 1 tablet (75 mg total) by mouth daily. 90 tablet 1   clotrimazole-betamethasone (LOTRISONE) cream Apply to plantar foot daily (Patient taking differently: Apply 1 Application topically 2 (two) times daily.) 45 g 3   CRANBERRY EXTRACT PO Take 25,000 mg by mouth 2 (two) times daily.     cycloSPORINE (RESTASIS) 0.05 % ophthalmic emulsion Place 1 drop into both eyes 2 (two) times daily. 16.5 mL 3   desmopressin (DDAVP) 0.2 MG tablet Take 1 tablet (0.2 mg total) by mouth at bedtime. 90 tablet 3   desonide (DESOWEN) 0.05 % cream Apply as directed to affected area. 15 g 0   Dulaglutide (TRULICITY) 3 MG/0.5ML SOPN Inject 3 mg as directed once a week. 9 mL 2   econazole nitrate 1 % cream Apply 1 Application topically as needed (skin irritation).     estradiol (ESTRACE) 0.1 MG/GM vaginal cream Apply 0.5 grams vaginally nightly for 2 weeks, then apply 0.5 grams 2 times weekly (Patient taking differently: Place 1 Applicatorful vaginally once a week.) 42.5 g 3   fluticasone (FLONASE) 50 MCG/ACT nasal spray Place 2 sprays into both nostrils daily. 48 g 3   glucose blood test strip Use as directed to check blood glucose daily. 100 each 12   losartan (COZAAR) 50 MG tablet Take 1 & 1/2 tablets (75 mg total) by mouth daily. 135 tablet 3   metFORMIN (GLUCOPHAGE) 500 MG tablet Take 1 tablet (500 mg total) by mouth 2 (two) times daily with a meal. (Patient taking differently: Take 500 mg by mouth daily with breakfast.) 180 tablet 3   mirabegron ER (MYRBETRIQ) 50 MG TB24 tablet Take 1 tablet (50 mg total) by mouth daily. 90 tablet 3  omega-3 acid ethyl esters (LOVAZA) 1 g capsule Take 2 capsules (2 g total) by mouth 2 (two) times daily. 360 capsule 3   OXYCODONE HCL PO Take 1 tablet by mouth as needed (pain).      pantoprazole (PROTONIX) 40 MG tablet Take 1 tablet (40 mg total) by mouth 2 (two) times daily. 180 tablet 3   Polyethyl Glycol-Propyl Glycol (SYSTANE OP) Apply 1 tablet to eye daily.     PSYLLIUM PO Take 1 tablet by mouth daily.     simvastatin (ZOCOR) 20 MG tablet Take 1 tablet (20 mg total) by mouth at bedtime. 90 tablet 3   solifenacin (VESICARE) 10 MG tablet Take 1 tablet (10 mg total) by mouth daily. *Need appointment for future refills.* 30 tablet 0   solifenacin (VESICARE) 10 MG tablet Take 1 tablet (10 mg total) by mouth daily. Swallow whole do not crush,chew,or split 30 tablet 3   Vitamin D, Ergocalciferol, (DRISDOL) 1.25 MG (50000 UNIT) CAPS capsule Take 1 capsule (50,000 Units total) by mouth every 7 (seven) days. 4 capsule 0   No current facility-administered medications on file prior to visit.    Review of Systems:  As per HPI- otherwise negative.   Physical Examination: Vitals:   02/06/23 0909  BP: 124/70  Pulse: 81  Resp: 18  Temp: 98.3 F (36.8 C)  SpO2: 95%   Vitals:   02/06/23 0909  Weight: 244 lb (110.7 kg)  Height: 5\' 1"  (1.549 m)   Body mass index is 46.1 kg/m. Ideal Body Weight: Weight in (lb) to have BMI = 25: 132  GEN: no acute distress.  Obese, looks well HEENT: Atraumatic, Normocephalic.  Bilateral TM wnl, oropharynx normal.  PEERL,EOMI.   Her tongue has a rather deep medial fissure.  She has had several dental restorations.  Otherwise her oral exam is normal, no sign of any current tongue injury or bleeding Ears and Nose: No external deformity. CV: RRR, No M/G/R. No JVD. No thrill. No extra heart sounds. PULM: CTA B, no wheezes, crackles, rhonchi. No retractions. No resp. distress. No accessory muscle use. EXTR: No c/c/e PSYCH: Normally interactive. Conversant.    Assessment and Plan: Laceration of tongue, sequela - Plan: CBC  Type 2 diabetes mellitus with other specified complication, without long-term current use of insulin (HCC) - Plan:  Comprehensive metabolic panel, Hemoglobin A1c  Essential hypertension - Plan: CBC  Patient seen today to follow-up a recent tongue injury.  Bleeding is now resolved, she feels fine.  She has kept with soft foods the last few days.  I encouraged her to continue with soft diet for a couple of days more, then can return to normal foods.  She will let us know if her tongue bleeding should return We will check CBC for any abnormality  I will follow-up in her diabetes maintenance today  Blood pressure well-controlled Will plan further follow- up pending labs.  Assuming all is well plan to recheck in 6 months  Signed Abbe Amsterdam, MD  Received lab work as below, message to patient  Results for orders placed or performed in visit on 02/06/23  CBC   Collection Time: 02/06/23  9:32 AM  Result Value Ref Range   WBC 7.4 4.0 - 10.5 K/uL   RBC 4.45 3.87 - 5.11 Mil/uL   Platelets 281.0 150.0 - 400.0 K/uL   Hemoglobin 12.9 12.0 - 15.0 g/dL   HCT 21.3 08.6 - 57.8 %   MCV 88.7 78.0 - 100.0 fl  MCHC 32.8 30.0 - 36.0 g/dL   RDW 16.1 (H) 09.6 - 04.5 %  Comprehensive metabolic panel   Collection Time: 02/06/23  9:32 AM  Result Value Ref Range   Sodium 140 135 - 145 mEq/L   Potassium 4.4 3.5 - 5.1 mEq/L   Chloride 103 96 - 112 mEq/L   CO2 30 19 - 32 mEq/L   Glucose, Bld 107 (H) 70 - 99 mg/dL   BUN 13 6 - 23 mg/dL   Creatinine, Ser 4.09 0.40 - 1.20 mg/dL   Total Bilirubin 0.6 0.2 - 1.2 mg/dL   Alkaline Phosphatase 58 39 - 117 U/L   AST 20 0 - 37 U/L   ALT 15 0 - 35 U/L   Total Protein 6.6 6.0 - 8.3 g/dL   Albumin 4.5 3.5 - 5.2 g/dL   GFR 81.19 >14.78 mL/min   Calcium 9.3 8.4 - 10.5 mg/dL  Hemoglobin G9F   Collection Time: 02/06/23  9:32 AM  Result Value Ref Range   Hgb A1c MFr Bld 6.4 4.6 - 6.5 %

## 2023-02-06 ENCOUNTER — Ambulatory Visit: Payer: Medicare PPO | Admitting: Family Medicine

## 2023-02-06 ENCOUNTER — Other Ambulatory Visit (HOSPITAL_BASED_OUTPATIENT_CLINIC_OR_DEPARTMENT_OTHER): Payer: Self-pay

## 2023-02-06 ENCOUNTER — Encounter: Payer: Self-pay | Admitting: Family Medicine

## 2023-02-06 VITALS — BP 124/70 | HR 81 | Temp 98.3°F | Resp 18 | Ht 61.0 in | Wt 244.0 lb

## 2023-02-06 DIAGNOSIS — E1169 Type 2 diabetes mellitus with other specified complication: Secondary | ICD-10-CM

## 2023-02-06 DIAGNOSIS — I10 Essential (primary) hypertension: Secondary | ICD-10-CM

## 2023-02-06 DIAGNOSIS — S01512D Laceration without foreign body of oral cavity, subsequent encounter: Secondary | ICD-10-CM

## 2023-02-06 DIAGNOSIS — S01512S Laceration without foreign body of oral cavity, sequela: Secondary | ICD-10-CM | POA: Diagnosis not present

## 2023-02-06 LAB — COMPREHENSIVE METABOLIC PANEL
ALT: 15 U/L (ref 0–35)
AST: 20 U/L (ref 0–37)
Albumin: 4.5 g/dL (ref 3.5–5.2)
Alkaline Phosphatase: 58 U/L (ref 39–117)
BUN: 13 mg/dL (ref 6–23)
CO2: 30 meq/L (ref 19–32)
Calcium: 9.3 mg/dL (ref 8.4–10.5)
Chloride: 103 meq/L (ref 96–112)
Creatinine, Ser: 0.55 mg/dL (ref 0.40–1.20)
GFR: 88.1 mL/min (ref >60.00)
Glucose, Bld: 107 mg/dL — ABNORMAL HIGH (ref 70–99)
Potassium: 4.4 meq/L (ref 3.5–5.1)
Sodium: 140 meq/L (ref 135–145)
Total Bilirubin: 0.6 mg/dL (ref 0.2–1.2)
Total Protein: 6.6 g/dL (ref 6.0–8.3)

## 2023-02-06 LAB — CBC
HCT: 39.5 % (ref 36.0–46.0)
Hemoglobin: 12.9 g/dL (ref 12.0–15.0)
MCHC: 32.8 g/dL (ref 30.0–36.0)
MCV: 88.7 fL (ref 78.0–100.0)
Platelets: 281 10*3/uL (ref 150.0–400.0)
RBC: 4.45 Mil/uL (ref 3.87–5.11)
RDW: 16 % — ABNORMAL HIGH (ref 11.5–15.5)
WBC: 7.4 10*3/uL (ref 4.0–10.5)

## 2023-02-06 LAB — HEMOGLOBIN A1C: Hgb A1c MFr Bld: 6.4 % (ref 4.6–6.5)

## 2023-02-06 MED ORDER — ESTRADIOL 0.1 MG/GM VA CREA
TOPICAL_CREAM | VAGINAL | 3 refills | Status: DC
  Filled 2023-02-06: qty 42.5, 90d supply, fill #0
  Filled 2023-05-09: qty 42.5, 90d supply, fill #1
  Filled 2023-09-11: qty 42.5, 90d supply, fill #2

## 2023-02-06 NOTE — Patient Instructions (Signed)
It was good to see you again today, I will be in touch with your blood work.  Let me know if this tongue bleeding should return!  Otherwise we can plan to visit in about 6 months

## 2023-02-13 ENCOUNTER — Other Ambulatory Visit: Payer: Self-pay | Admitting: Family Medicine

## 2023-02-13 ENCOUNTER — Other Ambulatory Visit (HOSPITAL_BASED_OUTPATIENT_CLINIC_OR_DEPARTMENT_OTHER): Payer: Self-pay

## 2023-02-13 DIAGNOSIS — Z8673 Personal history of transient ischemic attack (TIA), and cerebral infarction without residual deficits: Secondary | ICD-10-CM

## 2023-02-13 MED ORDER — CLOPIDOGREL BISULFATE 75 MG PO TABS
75.0000 mg | ORAL_TABLET | Freq: Every day | ORAL | 1 refills | Status: DC
Start: 2023-02-13 — End: 2023-07-11
  Filled 2023-02-13: qty 90, 90d supply, fill #0
  Filled 2023-05-09 – 2023-05-11 (×2): qty 90, 90d supply, fill #1
  Filled ????-??-??: fill #1

## 2023-02-14 ENCOUNTER — Other Ambulatory Visit (HOSPITAL_BASED_OUTPATIENT_CLINIC_OR_DEPARTMENT_OTHER): Payer: Self-pay

## 2023-02-17 ENCOUNTER — Other Ambulatory Visit (HOSPITAL_BASED_OUTPATIENT_CLINIC_OR_DEPARTMENT_OTHER): Payer: Self-pay

## 2023-02-20 ENCOUNTER — Other Ambulatory Visit (HOSPITAL_BASED_OUTPATIENT_CLINIC_OR_DEPARTMENT_OTHER): Payer: Self-pay

## 2023-02-20 MED ORDER — DESONIDE 0.05 % EX CREA
TOPICAL_CREAM | CUTANEOUS | 0 refills | Status: DC
  Filled 2023-02-20: qty 15, 15d supply, fill #0

## 2023-02-21 ENCOUNTER — Other Ambulatory Visit: Payer: Self-pay

## 2023-02-21 ENCOUNTER — Other Ambulatory Visit (HOSPITAL_BASED_OUTPATIENT_CLINIC_OR_DEPARTMENT_OTHER): Payer: Self-pay

## 2023-02-21 MED ORDER — CLOTRIMAZOLE-BETAMETHASONE 1-0.05 % EX CREA
TOPICAL_CREAM | CUTANEOUS | 3 refills | Status: AC
  Filled 2023-02-21: qty 45, fill #0
  Filled 2023-03-10: qty 45, 30d supply, fill #0
  Filled 2023-05-09: qty 45, 30d supply, fill #1
  Filled 2023-09-12: qty 45, 30d supply, fill #2
  Filled 2023-10-09: qty 45, 30d supply, fill #3

## 2023-03-10 ENCOUNTER — Other Ambulatory Visit (HOSPITAL_BASED_OUTPATIENT_CLINIC_OR_DEPARTMENT_OTHER): Payer: Self-pay

## 2023-03-10 ENCOUNTER — Other Ambulatory Visit: Payer: Self-pay

## 2023-03-15 DIAGNOSIS — D225 Melanocytic nevi of trunk: Secondary | ICD-10-CM | POA: Diagnosis not present

## 2023-03-15 DIAGNOSIS — L821 Other seborrheic keratosis: Secondary | ICD-10-CM | POA: Diagnosis not present

## 2023-03-15 DIAGNOSIS — L905 Scar conditions and fibrosis of skin: Secondary | ICD-10-CM | POA: Diagnosis not present

## 2023-03-15 DIAGNOSIS — Z85828 Personal history of other malignant neoplasm of skin: Secondary | ICD-10-CM | POA: Diagnosis not present

## 2023-03-17 ENCOUNTER — Other Ambulatory Visit (HOSPITAL_BASED_OUTPATIENT_CLINIC_OR_DEPARTMENT_OTHER): Payer: Self-pay

## 2023-03-19 ENCOUNTER — Encounter (INDEPENDENT_AMBULATORY_CARE_PROVIDER_SITE_OTHER): Payer: Medicare PPO | Admitting: Family Medicine

## 2023-03-19 DIAGNOSIS — R300 Dysuria: Secondary | ICD-10-CM

## 2023-03-20 ENCOUNTER — Other Ambulatory Visit (HOSPITAL_BASED_OUTPATIENT_CLINIC_OR_DEPARTMENT_OTHER): Payer: Self-pay

## 2023-03-20 ENCOUNTER — Other Ambulatory Visit (INDEPENDENT_AMBULATORY_CARE_PROVIDER_SITE_OTHER): Payer: Medicare PPO

## 2023-03-20 DIAGNOSIS — R300 Dysuria: Secondary | ICD-10-CM

## 2023-03-20 LAB — POCT URINALYSIS DIP (MANUAL ENTRY)
Bilirubin, UA: NEGATIVE
Blood, UA: NEGATIVE
Glucose, UA: NEGATIVE mg/dL
Ketones, POC UA: NEGATIVE mg/dL
Nitrite, UA: POSITIVE — AB
Protein Ur, POC: NEGATIVE mg/dL
Spec Grav, UA: 1.01 (ref 1.010–1.025)
Urobilinogen, UA: 0.2 U/dL
pH, UA: 7.5 (ref 5.0–8.0)

## 2023-03-20 MED ORDER — NITROFURANTOIN MONOHYD MACRO 100 MG PO CAPS
100.0000 mg | ORAL_CAPSULE | Freq: Two times a day (BID) | ORAL | 0 refills | Status: DC
  Filled 2023-03-20: qty 14, 7d supply, fill #0

## 2023-03-20 NOTE — Telephone Encounter (Signed)
Please see the MyChart message reply(ies) for my assessment and plan.  The patient gave consent for this Medical Advice Message and is aware that it may result in a bill to their insurance company as well as the possibility that this may result in a co-payment or deductible. They are an established patient, but are not seeking medical advice exclusively about a problem treated during an in person or video visit in the last 7 days. I did not recommend an in person or video visit within 7 days of my reply.  I spent a total of 15 minutes cumulative time within 7 days through [employer] Abbe Amsterdam, MD

## 2023-03-20 NOTE — Addendum Note (Signed)
Addended by: Abbe Amsterdam C on: 03/20/2023 02:57 PM   Modules accepted: Orders

## 2023-03-21 ENCOUNTER — Telehealth: Payer: Self-pay

## 2023-03-21 LAB — UNLABELED

## 2023-03-21 NOTE — Progress Notes (Signed)
Porsha sent back in paperwork so they can get this done

## 2023-03-21 NOTE — Telephone Encounter (Signed)
CRM has been forwarded to lab staff. Copied from CRM [id number]. Topic: Clinical - Request for Lab/Test Order >> Mar 21, 2023  1:53 PM Orinda Kenner C wrote: Reason for CRM: Gunnar Fusi from [facility] [phone] question on blood specimen collected yesterday 03/20/23, no patient name was not the tube, just the order was attached. They need to know what to do. Please advise.

## 2023-03-21 NOTE — Telephone Encounter (Signed)
Copied from CRM [id number]. Topic: Clinical - Request for Lab/Test Order >> Mar 21, 2023  1:53 PM Orinda Kenner C wrote: Reason for CRM: Gunnar Fusi from [facility] [phone] question on blood specimen collected yesterday 03/20/23, no patient name was not the tube, just the order was attached. They need to know what to do. Please advise.

## 2023-03-22 ENCOUNTER — Telehealth: Payer: Self-pay

## 2023-03-22 LAB — URINE CULTURE
MICRO NUMBER:: 16007833
SPECIMEN QUALITY:: ADEQUATE

## 2023-03-22 LAB — PAT ID TIQ DOC

## 2023-03-22 NOTE — Telephone Encounter (Signed)
Issue was resolved with [facility] and result has reported.

## 2023-03-22 NOTE — Telephone Encounter (Signed)
Pt was spoken to by Dr Patsy Lager

## 2023-03-22 NOTE — Telephone Encounter (Signed)
Copied from CRM [id number]. Topic: Clinical - Lab/Test Results >> Mar 22, 2023  9:03 AM Gurney Maxin H wrote: Reason for CRM: Patient is following up on test results from on 1/27.  Jasmin [name] [phone]

## 2023-03-26 ENCOUNTER — Other Ambulatory Visit: Payer: Self-pay | Admitting: Family Medicine

## 2023-03-26 DIAGNOSIS — E782 Mixed hyperlipidemia: Secondary | ICD-10-CM

## 2023-03-27 ENCOUNTER — Other Ambulatory Visit (HOSPITAL_BASED_OUTPATIENT_CLINIC_OR_DEPARTMENT_OTHER): Payer: Self-pay

## 2023-03-27 MED ORDER — SIMVASTATIN 20 MG PO TABS
20.0000 mg | ORAL_TABLET | Freq: Every day | ORAL | 1 refills | Status: DC
  Filled 2023-03-27: qty 90, 90d supply, fill #0
  Filled 2023-06-26: qty 90, 90d supply, fill #1

## 2023-04-03 ENCOUNTER — Other Ambulatory Visit (HOSPITAL_BASED_OUTPATIENT_CLINIC_OR_DEPARTMENT_OTHER): Payer: Self-pay

## 2023-04-06 ENCOUNTER — Other Ambulatory Visit: Payer: Self-pay

## 2023-04-06 ENCOUNTER — Other Ambulatory Visit: Payer: Self-pay | Admitting: Family Medicine

## 2023-04-06 ENCOUNTER — Other Ambulatory Visit (HOSPITAL_BASED_OUTPATIENT_CLINIC_OR_DEPARTMENT_OTHER): Payer: Self-pay

## 2023-04-06 MED ORDER — ACCU-CHEK SOFTCLIX LANCETS MISC
Freq: Every day | 3 refills | Status: DC
  Filled 2023-04-06: qty 100, 100d supply, fill #0
  Filled 2023-07-11: qty 100, 100d supply, fill #1
  Filled 2023-10-19: qty 100, 100d supply, fill #2

## 2023-04-06 MED ORDER — DESONIDE 0.05 % EX CREA
TOPICAL_CREAM | CUTANEOUS | 0 refills | Status: DC
  Filled 2023-04-06: qty 15, 14d supply, fill #0

## 2023-04-10 ENCOUNTER — Ambulatory Visit: Payer: Medicare PPO | Admitting: Family Medicine

## 2023-04-14 ENCOUNTER — Other Ambulatory Visit (HOSPITAL_BASED_OUTPATIENT_CLINIC_OR_DEPARTMENT_OTHER): Payer: Self-pay

## 2023-04-14 MED ORDER — CYCLOSPORINE 0.05 % OP EMUL
1.0000 [drp] | Freq: Two times a day (BID) | OPHTHALMIC | 2 refills | Status: DC
Start: 2023-04-14 — End: 2023-09-12
  Filled 2023-04-14: qty 60, 30d supply, fill #0
  Filled 2023-07-10: qty 60, 30d supply, fill #1
  Filled 2023-08-13: qty 60, 30d supply, fill #2

## 2023-04-17 ENCOUNTER — Other Ambulatory Visit (HOSPITAL_BASED_OUTPATIENT_CLINIC_OR_DEPARTMENT_OTHER): Payer: Self-pay

## 2023-04-17 DIAGNOSIS — I272 Pulmonary hypertension, unspecified: Secondary | ICD-10-CM | POA: Diagnosis not present

## 2023-04-17 DIAGNOSIS — G4733 Obstructive sleep apnea (adult) (pediatric): Secondary | ICD-10-CM | POA: Diagnosis not present

## 2023-04-17 DIAGNOSIS — Z6841 Body Mass Index (BMI) 40.0 and over, adult: Secondary | ICD-10-CM | POA: Diagnosis not present

## 2023-04-18 ENCOUNTER — Other Ambulatory Visit (HOSPITAL_BASED_OUTPATIENT_CLINIC_OR_DEPARTMENT_OTHER): Payer: Self-pay

## 2023-04-20 ENCOUNTER — Other Ambulatory Visit (HOSPITAL_BASED_OUTPATIENT_CLINIC_OR_DEPARTMENT_OTHER): Payer: Self-pay

## 2023-04-24 ENCOUNTER — Other Ambulatory Visit: Payer: Self-pay | Admitting: Family Medicine

## 2023-04-24 ENCOUNTER — Other Ambulatory Visit (HOSPITAL_BASED_OUTPATIENT_CLINIC_OR_DEPARTMENT_OTHER): Payer: Self-pay

## 2023-04-24 DIAGNOSIS — J3089 Other allergic rhinitis: Secondary | ICD-10-CM

## 2023-04-24 MED ORDER — FLUTICASONE PROPIONATE 50 MCG/ACT NA SUSP
2.0000 | Freq: Every day | NASAL | 3 refills | Status: AC
  Filled 2023-04-24: qty 48, 90d supply, fill #0
  Filled 2023-07-25: qty 48, 90d supply, fill #1
  Filled 2023-10-23: qty 48, 90d supply, fill #2
  Filled 2024-01-19 (×2): qty 48, 90d supply, fill #3

## 2023-04-24 NOTE — Patient Instructions (Incomplete)
It was great to see you again today!  

## 2023-04-24 NOTE — Progress Notes (Unsigned)
 [facility] at [facility] [address], [address] [place], [place] [address] [phone]  Date:  04/27/2023   Name:  Jasmin [name]   DOB:  Aug 18, 1945   MRN:  [id number]  PCP:  Pearline Cables, MD    Chief Complaint: No chief complaint on file.   History of Present Illness:  Jasmin [name] is a 78 y.o. very pleasant female patient who presents with the following:  Patient seen today for medication follow-up Most recent visit with myself was in December after she bit her tongue and had some bleeding, was treated at urgent care History of well-controlled diabetes, hypertension, hyperlipidemia, stroke, OSA, obesity, pulmonary hypertension, aortic stenosis She is followed by pulmonology, Dr. Su Monks for her pulmonary hypertension and sleep apnea Her cardiologist is Dr Kirtland Bouchard- he is now following due to coronary calcium in 95th percentile   Eye exam Can update foot exam Blood work done in [employer], CBC, A1c  Up-to-date on pneumonia vaccines, flu, COVID, Shingrix, RSV  Aspirin 81 Plavix DDAVP 0.2 at bedtime Trulicity 3 mg Vaginal estrogen cream Losartan 75 Metformin 500 mg daily Lovaza  Zocor 20 Vesicare  Lab Results  Component Value Date   HGBA1C 6.4 02/06/2023   Most recent visit with cardiology was in November: Elevated coronary calcium score.  Asymptomatic, stress test earlier this year negative we will continue monitoring. Nonrheumatic arctic valve stenosis, not critical only moderate we will continue monitoring.  1 more time To her with the signs and symptoms of significant valve problem meaning dizziness passing out chest pain shortness of breath she will let us know if she develop those. Obstructive sleep apnea CPAP mask doing well. Diabetes followed by internal medicine plan team well-controlled hemoglobin A1c 6.4. Dyslipidemia total cholesterol 157 HDL 39 continue present management continue dual antiplatelet therapy.     She saw her pulmonologist, Dr.Ejaz about 10 days ago: 1 obstructive sleep apnea Initial diagnosis and the 1990s has been on treatment for more than 25 years, recently switched over to [name] for followups, I was able to review her previous download showing adequate resolution of underlying sleep apnea on her current CPAP of 8. She is with [facility] care.  Remains compliant with excellent usage reviewed the last 30-day download again as part of her yearly follow-up usage greater than 4 hours is at 100% averaging 9 hours and 20 minutes of daily use on CPAP of 8 with residual AHI well corrected at 2.2 leaks are minimal does not need any changes She had had replaced machine a few years ago, understands that every 5 years will be updated with a new device, using it regularly, stays in touch with the DME company does not need any changes #2 Pulm Htn: With LVH - diastolic dysfunction, following a strict diet-background of aortic stenosis overall doing well denies any orthopnea or lower extremity edema, mild chronic cough from above, does not need inhalers  Follows with cardiology at [facility], recent notes from cardiology visit reviewed, doing very well staying euvolemic had carotid ultrasounds with good vascular perfusion, Discussed lifestyle modifications with limitation of sodium and fluid restriction given she has to urinate at night, otherwise does not have any parenchymal lung disease   Patient Active Problem List   Diagnosis Date Noted   Impaired functional mobility, balance, gait, and endurance 01/02/2023   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   SCC (squamous cell carcinoma), arm, 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/2022   Obesity, Beginning BMI 46.86 03/15/2022  Elevated coronary artery calcium score 02/24/2022   Left sided sciatica 07/05/2021   Skin cancer 11/04/2020   BMI 45.0-49.9, adult (HCC) 07/31/2020   Swelling of both lower extremities    Shortness of breath on exertion    Red eyes    Knee pain    Joint pain     Decreased hearing    Back pain    Nocturia 12/26/2018   Recurrent UTI 12/26/2018   Vaginal atrophy 12/26/2018   Hyperopia with astigmatism and presbyopia, bilateral 02/09/2018   Long term current use of oral hypoglycemic drug 02/09/2018   PCO (posterior capsular opacification), left 02/09/2018   Pseudophakia of left eye 02/09/2018   PVD (posterior vitreous detachment), both eyes 02/09/2018   Arthritis 12/16/2016   Right kidney stone    Hyperlipidemia    Heart murmur    Diabetes mellitus without complication (HCC)    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   Cystocele with rectocele 08/21/2016   Mixed hyperlipidemia 08/08/2016   Controlled type 2 diabetes mellitus without complication, without long-term current use of insulin (HCC) 08/08/2016   Essential hypertension 08/08/2016   History of CVA (cerebrovascular accident) 08/08/2016   Nuclear sclerotic cataract of left eye 07/07/2016   OAB (overactive bladder) 01/20/2016   Class 3 severe obesity with serious comorbidity and body mass index (BMI) of 45.0 to 49.9 in adult (HCC) 11/10/2015   IBS (irritable bowel syndrome) 07/16/2015   Insomnia 07/16/2015   Left ventricular hypertrophy 07/16/2015   Pulmonary hypertension (HCC) 07/16/2015   Primary osteoarthritis of left knee 03/31/2015   Acid reflux disease 03/09/2015   Aortic stenosis 03/09/2015   AR (allergic rhinitis) 03/09/2015   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   OSA (obstructive sleep apnea) 03/09/2015   Osteoporosis 03/09/2015   Vitamin D deficiency 03/09/2015   Left tibialis posterior tendinitis 12/13/2013    Past Medical History:  Diagnosis Date   Acid reflux disease 03/09/2015   Aortic stenosis 03/09/2015   AR (allergic rhinitis) 03/09/2015   Arthritis    Back pain    Controlled type 2 diabetes mellitus without complication, without long-term current use of insulin (HCC) 08/08/2016   Cough    Cystocele with rectocele 08/21/2016   Decreased hearing     Diabetes mellitus without complication (HCC)    Dry mouth    Easy bruising    Essential hypertension 08/08/2016   Heart murmur    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   History of CVA (cerebrovascular accident) 08/08/2016   Seen on MRI from 08/2015   Hyperlipidemia    Hypertension    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   IBS (irritable bowel syndrome) 07/16/2015   Insomnia 07/16/2015   Joint pain    Knee pain    Left ventricular hypertrophy 07/16/2015   Leg cramping    right leg   Mixed hyperlipidemia 08/08/2016   Morbid obesity (HCC) 11/10/2015   Nasal discharge    Nuclear sclerotic cataract of left eye 07/07/2016   OAB (overactive bladder) 01/20/2016   OSA (obstructive sleep apnea) 03/09/2015   Osteoporosis 03/09/2015   Primary osteoarthritis of left knee 03/31/2015   Pulmonary hypertension (HCC) 07/16/2015   Red eyes    Right kidney stone    Shortness of breath on exertion    Skin cancer 11/04/2020   Sleep apnea    Stroke (HCC)    Swelling of both lower extremities    UTI (urinary tract infection)    Vitamin D deficiency 03/09/2015    Past Surgical  History:  Procedure Laterality Date   ABDOMINAL HYSTERECTOMY  09/14/2016   CATARACT EXTRACTION Left    CATARACT EXTRACTION Left 06/2016   COLPORRHAPHY N/A 09/14/2016   [facility]   CYSTOCELE REPAIR     FRACTURE SURGERY Left 1953   L arm   FRACTURE SURGERY Left 1985   L ankle   HERNIA REPAIR  2004   JOINT REPLACEMENT Right 2012   R TKA   JOINT REPLACEMENT Left 2017   KIDNEY STONE SURGERY     LAPAROSCOPIC ASSISTED VAGINAL HYSTERECTOMY N/A 09/14/2016   [facility]   R foot/ankle Right 2013 and 2014   plate and screws lateral foot and tendon removal medial foot in 2013, revision in 2014   RECTOCELE REPAIR     URETERAL STENT PLACEMENT     URETEROSCOPY      Social History   Tobacco Use   Smoking status: Never    Passive exposure: Never   Smokeless tobacco: Never  Vaping Use   Vaping status: Never Used  Substance Use Topics    Alcohol use: Yes    Alcohol/week: 2.0 - 3.0 standard drinks of alcohol    Types: 2 - 3 Glasses of wine per week    Comment: couple of glasses per week   Drug use: No    Family History  Problem Relation Age of Onset   Hyperlipidemia Mother    Heart disease Mother    Hypertension Mother    Stroke Mother    Thyroid disease Mother    Hypertension Father    Heart disease Father     Allergies  Allergen Reactions   Latex Itching   Ciprofloxacin Hives   Metronidazole Hives   Tape Rash and Itching    Redness of skin    Medication list has been reviewed and updated.  Current Outpatient Medications on File Prior to Visit  Medication Sig Dispense Refill   Accu-Chek Softclix Lancets lancets USE AS DIRECTED TO CHECK BLOOD GLUCOSE ONCE DAILY 100 each 3   Acetaminophen (TYLENOL ARTHRITIS PAIN PO) Take 650 mg by mouth every 8 (eight) hours as needed (PAIN).     Ascorbic Acid (VITAMIN C WITH ROSE HIPS) 500 MG tablet Take 500 mg by mouth daily.     aspirin EC 81 MG tablet Take 1 tablet (81 mg total) by mouth daily. Swallow whole. 120 tablet 3   blood glucose meter kit and supplies KIT Dispense based on patient and insurance preference. Use once daily to monitor glucose as needed (FOR ICD-9 250.00, 250.01). (Patient taking differently: Inject 1 each into the skin as directed. Dispense based on patient and insurance preference. Use once daily to monitor glucose as needed (FOR ICD-9 250.00, 250.01).) 1 each 0   CALCIUM PO Take 1 tablet by mouth daily.     Cholecalciferol (VITAMIN D3) 2000 units TABS Take 2 tablets by mouth in the morning. Take one tablet in the AM     clopidogrel (PLAVIX) 75 MG tablet Take 1 tablet (75 mg total) by mouth daily. 90 tablet 1   clotrimazole-betamethasone (LOTRISONE) cream Apply to plantar foot daily 45 g 3   CRANBERRY EXTRACT PO Take 25,000 mg by mouth 2 (two) times daily.     cycloSPORINE (RESTASIS) 0.05 % ophthalmic emulsion Place 1 drop into both eyes 2 (two)  times daily. 16.5 mL 3   cycloSPORINE (RESTASIS) 0.05 % ophthalmic emulsion Place 1 drop into both eyes 2 (two) times daily. 60 each 2   desmopressin (DDAVP) 0.2 MG tablet  Take 1 tablet (0.2 mg total) by mouth at bedtime. 90 tablet 3   desonide (DESOWEN) 0.05 % cream Apply as directed to affected area. 15 g 0   Dulaglutide (TRULICITY) 3 MG/0.5ML SOPN Inject 3 mg as directed once a week. 9 mL 2   econazole nitrate 1 % cream Apply 1 Application topically as needed (skin irritation).     estradiol (ESTRACE) 0.1 MG/GM vaginal cream Apply 0.5 grams vaginally nightly for 2 weeks, then apply 0.5 grams 2 times weekly 42.5 g 3   fluticasone (FLONASE) 50 MCG/ACT nasal spray Place 2 sprays into both nostrils daily. 48 g 3   glucose blood test strip Use as directed to check blood glucose daily. 100 each 12   losartan (COZAAR) 50 MG tablet Take 1 & 1/2 tablets (75 mg total) by mouth daily. 135 tablet 3   metFORMIN (GLUCOPHAGE) 500 MG tablet Take 1 tablet (500 mg total) by mouth 2 (two) times daily with a meal. (Patient taking differently: Take 500 mg by mouth daily with breakfast.) 180 tablet 3   mirabegron ER (MYRBETRIQ) 50 MG TB24 tablet Take 1 tablet (50 mg total) by mouth daily. 90 tablet 3   nitrofurantoin, macrocrystal-monohydrate, (MACROBID) 100 MG capsule Take 1 capsule (100 mg total) by mouth 2 (two) times daily. 14 capsule 0   omega-3 acid ethyl esters (LOVAZA) 1 g capsule Take 2 capsules (2 g total) by mouth 2 (two) times daily. 360 capsule 3   OXYCODONE HCL PO Take 1 tablet by mouth as needed (pain).     pantoprazole (PROTONIX) 40 MG tablet Take 1 tablet (40 mg total) by mouth 2 (two) times daily. 180 tablet 3   Polyethyl Glycol-Propyl Glycol (SYSTANE OP) Apply 1 tablet to eye daily.     PSYLLIUM PO Take 1 tablet by mouth daily.     simvastatin (ZOCOR) 20 MG tablet Take 1 tablet (20 mg total) by mouth at bedtime. 90 tablet 1   solifenacin (VESICARE) 10 MG tablet Take 1 tablet (10 mg total) by mouth  daily. *Need appointment for future refills.* 30 tablet 0   solifenacin (VESICARE) 10 MG tablet Take 1 tablet (10 mg total) by mouth daily. Swallow whole do not crush, chew, or split. 30 tablet 3   Vitamin D, Ergocalciferol, (DRISDOL) 1.25 MG (50000 UNIT) CAPS capsule Take 1 capsule (50,000 Units total) by mouth every 7 (seven) days. 4 capsule 0   No current facility-administered medications on file prior to visit.    Review of Systems:  As per HPI- otherwise negative.   Physical Examination: There were no vitals filed for this visit. There were no vitals filed for this visit. There is no height or weight on file to calculate BMI. Ideal Body Weight:    GEN: no acute distress. HEENT: Atraumatic, Normocephalic.  Ears and Nose: No external deformity. CV: RRR, No M/G/R. No JVD. No thrill. No extra heart sounds. PULM: CTA B, no wheezes, crackles, rhonchi. No retractions. No resp. distress. No accessory muscle use. ABD: S, NT, ND, +BS. No rebound. No HSM. EXTR: No c/c/e PSYCH: Normally interactive. Conversant.  Foot exam  Assessment and Plan: ***  Signed Abbe Amsterdam, MD

## 2023-04-25 ENCOUNTER — Other Ambulatory Visit (HOSPITAL_BASED_OUTPATIENT_CLINIC_OR_DEPARTMENT_OTHER): Payer: Self-pay

## 2023-04-26 ENCOUNTER — Other Ambulatory Visit (HOSPITAL_BASED_OUTPATIENT_CLINIC_OR_DEPARTMENT_OTHER): Payer: Self-pay

## 2023-04-27 ENCOUNTER — Ambulatory Visit: Payer: Self-pay | Admitting: Family Medicine

## 2023-04-27 ENCOUNTER — Encounter: Payer: Self-pay | Admitting: Family Medicine

## 2023-04-27 ENCOUNTER — Other Ambulatory Visit (HOSPITAL_BASED_OUTPATIENT_CLINIC_OR_DEPARTMENT_OTHER): Payer: Self-pay

## 2023-04-27 ENCOUNTER — Ambulatory Visit: Admitting: Family Medicine

## 2023-04-27 VITALS — BP 130/78 | HR 85 | Ht 61.0 in | Wt 242.0 lb

## 2023-04-27 DIAGNOSIS — E1169 Type 2 diabetes mellitus with other specified complication: Secondary | ICD-10-CM | POA: Diagnosis not present

## 2023-04-27 DIAGNOSIS — Z7984 Long term (current) use of oral hypoglycemic drugs: Secondary | ICD-10-CM

## 2023-04-27 DIAGNOSIS — E782 Mixed hyperlipidemia: Secondary | ICD-10-CM

## 2023-04-27 DIAGNOSIS — I10 Essential (primary) hypertension: Secondary | ICD-10-CM | POA: Diagnosis not present

## 2023-04-27 LAB — CBC
HCT: 40.6 % (ref 36.0–46.0)
Hemoglobin: 13.3 g/dL (ref 12.0–15.0)
MCHC: 32.8 g/dL (ref 30.0–36.0)
MCV: 88.9 fl (ref 78.0–100.0)
Platelets: 301 10*3/uL (ref 150.0–400.0)
RBC: 4.56 Mil/uL (ref 3.87–5.11)
RDW: 16 % — ABNORMAL HIGH (ref 11.5–15.5)
WBC: 8 10*3/uL (ref 4.0–10.5)

## 2023-04-27 LAB — COMPREHENSIVE METABOLIC PANEL
ALT: 14 U/L (ref 0–35)
AST: 19 U/L (ref 0–37)
Albumin: 4.5 g/dL (ref 3.5–5.2)
Alkaline Phosphatase: 50 U/L (ref 39–117)
BUN: 14 mg/dL (ref 6–23)
CO2: 29 meq/L (ref 19–32)
Calcium: 9.8 mg/dL (ref 8.4–10.5)
Chloride: 102 meq/L (ref 96–112)
Creatinine, Ser: 0.53 mg/dL (ref 0.40–1.20)
GFR: 88.75 mL/min (ref >60.00)
Glucose, Bld: 108 mg/dL — ABNORMAL HIGH (ref 70–99)
Potassium: 4.7 meq/L (ref 3.5–5.1)
Sodium: 138 meq/L (ref 135–145)
Total Bilirubin: 0.9 mg/dL (ref 0.2–1.2)
Total Protein: 6.7 g/dL (ref 6.0–8.3)

## 2023-04-27 LAB — HEMOGLOBIN A1C: Hgb A1c MFr Bld: 6.3 % (ref 4.6–6.5)

## 2023-04-27 MED ORDER — OZEMPIC (0.25 OR 0.5 MG/DOSE) 2 MG/3ML ~~LOC~~ SOPN: PEN_INJECTOR | SUBCUTANEOUS | Status: DC

## 2023-04-28 ENCOUNTER — Other Ambulatory Visit (HOSPITAL_BASED_OUTPATIENT_CLINIC_OR_DEPARTMENT_OTHER): Payer: Self-pay

## 2023-05-01 ENCOUNTER — Ambulatory Visit: Admitting: Family Medicine

## 2023-05-01 ENCOUNTER — Other Ambulatory Visit (HOSPITAL_BASED_OUTPATIENT_CLINIC_OR_DEPARTMENT_OTHER): Payer: Self-pay

## 2023-05-03 ENCOUNTER — Other Ambulatory Visit (HOSPITAL_BASED_OUTPATIENT_CLINIC_OR_DEPARTMENT_OTHER): Payer: Self-pay

## 2023-05-03 ENCOUNTER — Other Ambulatory Visit (HOSPITAL_BASED_OUTPATIENT_CLINIC_OR_DEPARTMENT_OTHER): Payer: Self-pay | Admitting: Family Medicine

## 2023-05-03 DIAGNOSIS — H26491 Other secondary cataract, right eye: Secondary | ICD-10-CM | POA: Diagnosis not present

## 2023-05-03 DIAGNOSIS — H43813 Vitreous degeneration, bilateral: Secondary | ICD-10-CM | POA: Diagnosis not present

## 2023-05-03 DIAGNOSIS — E119 Type 2 diabetes mellitus without complications: Secondary | ICD-10-CM | POA: Diagnosis not present

## 2023-05-03 DIAGNOSIS — H16223 Keratoconjunctivitis sicca, not specified as Sjogren's, bilateral: Secondary | ICD-10-CM | POA: Diagnosis not present

## 2023-05-03 DIAGNOSIS — Z7984 Long term (current) use of oral hypoglycemic drugs: Secondary | ICD-10-CM | POA: Diagnosis not present

## 2023-05-03 DIAGNOSIS — Z139 Encounter for screening, unspecified: Secondary | ICD-10-CM

## 2023-05-03 DIAGNOSIS — Z961 Presence of intraocular lens: Secondary | ICD-10-CM | POA: Diagnosis not present

## 2023-05-03 DIAGNOSIS — H52223 Regular astigmatism, bilateral: Secondary | ICD-10-CM | POA: Diagnosis not present

## 2023-05-03 LAB — HM DIABETES EYE EXAM

## 2023-05-04 ENCOUNTER — Other Ambulatory Visit (HOSPITAL_BASED_OUTPATIENT_CLINIC_OR_DEPARTMENT_OTHER): Payer: Self-pay

## 2023-05-05 ENCOUNTER — Other Ambulatory Visit (HOSPITAL_BASED_OUTPATIENT_CLINIC_OR_DEPARTMENT_OTHER): Payer: Self-pay

## 2023-05-05 ENCOUNTER — Encounter: Payer: Self-pay | Admitting: Family Medicine

## 2023-05-08 ENCOUNTER — Other Ambulatory Visit (HOSPITAL_BASED_OUTPATIENT_CLINIC_OR_DEPARTMENT_OTHER): Payer: Self-pay

## 2023-05-09 ENCOUNTER — Other Ambulatory Visit (HOSPITAL_BASED_OUTPATIENT_CLINIC_OR_DEPARTMENT_OTHER): Payer: Self-pay

## 2023-05-10 ENCOUNTER — Other Ambulatory Visit (HOSPITAL_BASED_OUTPATIENT_CLINIC_OR_DEPARTMENT_OTHER): Payer: Self-pay

## 2023-05-11 ENCOUNTER — Other Ambulatory Visit (HOSPITAL_BASED_OUTPATIENT_CLINIC_OR_DEPARTMENT_OTHER): Payer: Self-pay

## 2023-05-12 ENCOUNTER — Other Ambulatory Visit: Payer: Self-pay

## 2023-05-15 ENCOUNTER — Other Ambulatory Visit (HOSPITAL_BASED_OUTPATIENT_CLINIC_OR_DEPARTMENT_OTHER): Payer: Self-pay

## 2023-05-16 ENCOUNTER — Other Ambulatory Visit: Payer: Self-pay | Admitting: Family Medicine

## 2023-05-16 ENCOUNTER — Other Ambulatory Visit: Payer: Self-pay

## 2023-05-16 ENCOUNTER — Other Ambulatory Visit (HOSPITAL_BASED_OUTPATIENT_CLINIC_OR_DEPARTMENT_OTHER): Payer: Self-pay

## 2023-05-16 ENCOUNTER — Encounter: Payer: Self-pay | Admitting: Family Medicine

## 2023-05-16 DIAGNOSIS — E782 Mixed hyperlipidemia: Secondary | ICD-10-CM

## 2023-05-16 MED ORDER — OMEGA-3-ACID ETHYL ESTERS 1 G PO CAPS
2.0000 g | ORAL_CAPSULE | Freq: Two times a day (BID) | ORAL | 1 refills | Status: DC
  Filled 2023-05-16 – 2023-05-17 (×2): qty 360, 90d supply, fill #0
  Filled 2023-08-13: qty 360, 90d supply, fill #1

## 2023-05-17 ENCOUNTER — Other Ambulatory Visit: Payer: Self-pay

## 2023-05-17 ENCOUNTER — Other Ambulatory Visit (HOSPITAL_BASED_OUTPATIENT_CLINIC_OR_DEPARTMENT_OTHER): Payer: Self-pay

## 2023-05-18 ENCOUNTER — Other Ambulatory Visit (HOSPITAL_BASED_OUTPATIENT_CLINIC_OR_DEPARTMENT_OTHER): Payer: Self-pay

## 2023-05-23 ENCOUNTER — Other Ambulatory Visit: Payer: Self-pay | Admitting: Family Medicine

## 2023-05-23 ENCOUNTER — Other Ambulatory Visit: Payer: Self-pay

## 2023-05-23 ENCOUNTER — Other Ambulatory Visit (HOSPITAL_BASED_OUTPATIENT_CLINIC_OR_DEPARTMENT_OTHER): Payer: Self-pay

## 2023-05-23 MED ORDER — MIRABEGRON ER 50 MG PO TB24
50.0000 mg | ORAL_TABLET | Freq: Every day | ORAL | 3 refills | Status: DC
  Filled 2023-05-23: qty 90, 90d supply, fill #0
  Filled 2023-08-23: qty 90, 90d supply, fill #1
  Filled 2023-11-20: qty 90, 90d supply, fill #2
  Filled ????-??-??: fill #2

## 2023-06-02 ENCOUNTER — Other Ambulatory Visit (HOSPITAL_BASED_OUTPATIENT_CLINIC_OR_DEPARTMENT_OTHER): Payer: Self-pay

## 2023-06-02 MED ORDER — SOLIFENACIN SUCCINATE 10 MG PO TABS
10.0000 mg | ORAL_TABLET | Freq: Every day | ORAL | 3 refills | Status: DC
  Filled 2023-06-02: qty 30, 30d supply, fill #0
  Filled 2023-07-10: qty 30, 30d supply, fill #1
  Filled 2023-08-06: qty 30, 30d supply, fill #2
  Filled 2023-09-05: qty 30, 30d supply, fill #3

## 2023-06-06 ENCOUNTER — Other Ambulatory Visit (HOSPITAL_BASED_OUTPATIENT_CLINIC_OR_DEPARTMENT_OTHER): Payer: Self-pay

## 2023-06-06 MED ORDER — DESONIDE 0.05 % EX CREA
TOPICAL_CREAM | CUTANEOUS | 0 refills | Status: DC
Start: 2023-06-06 — End: 2023-08-14
  Filled 2023-06-06: qty 15, 20d supply, fill #0

## 2023-06-12 ENCOUNTER — Encounter (HOSPITAL_BASED_OUTPATIENT_CLINIC_OR_DEPARTMENT_OTHER): Payer: Self-pay

## 2023-06-12 ENCOUNTER — Ambulatory Visit (HOSPITAL_BASED_OUTPATIENT_CLINIC_OR_DEPARTMENT_OTHER)
Admission: RE | Admit: 2023-06-12 | Discharge: 2023-06-12 | Disposition: A | Discharge status: Home / Self Care | Source: Ambulatory Visit | Attending: Family Medicine | Admitting: Family Medicine

## 2023-06-12 DIAGNOSIS — Z1231 Encounter for screening mammogram for malignant neoplasm of breast: Secondary | ICD-10-CM | POA: Diagnosis not present

## 2023-06-12 DIAGNOSIS — Z139 Encounter for screening, unspecified: Secondary | ICD-10-CM | POA: Diagnosis present

## 2023-06-20 NOTE — Progress Notes (Unsigned)
 [facility] at [facility] [address] [place], [place] [address] [phone]  Date:  06/21/2023   Name:  Jasmin [name]   DOB:  [birth year]   MRN:  [id number]  PCP:  Jasmin Partridge, MD    Chief Complaint: No chief complaint on file.   History of Present Illness:  Jasmin [name] is a 78 y.o. very pleasant female patient who presents with the following:  Patient seen today for medication follow-up.  Most recent visit with myself just about 1 month ago History of well-controlled diabetes, hypertension, hyperlipidemia, stroke, OSA, obesity, pulmonary hypertension, aortic stenosis She is followed by pulmonology, Dr. Annamaria [name] for her pulmonary hypertension and sleep apnea Her cardiologist is Dr Jasmin [name]- he is now following due to coronary calcium  in 95th percentile   At her last visit she was on Trulicity  3 mg but was having a lot of side effects and did not think it losing weight.  She also previously had side effects with Mounjaro .  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/2022   Obesity, Beginning BMI 46.86 03/15/2022   Elevated coronary artery calcium  score 02/24/2022   Left sided sciatica 07/05/2021   Skin cancer 11/04/2020   BMI 45.0-49.9, adult (HCC) 07/31/2020   Swelling of both lower extremities    Shortness of breath on exertion    Red eyes    Knee pain    Joint pain    Decreased hearing    Back pain    Nocturia 12/26/2018   Recurrent UTI 12/26/2018   Vaginal atrophy 12/26/2018   Hyperopia with astigmatism and presbyopia, bilateral 02/09/2018   Long term current use of oral hypoglycemic drug 02/09/2018   PCO  (posterior capsular opacification), left 02/09/2018   Pseudophakia of left eye 02/09/2018   PVD (posterior vitreous detachment), both eyes 02/09/2018   Arthritis 12/16/2016   Right kidney stone    Hyperlipidemia    Heart murmur    Diabetes mellitus without complication (HCC)    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   Cystocele with rectocele 08/21/2016   Mixed hyperlipidemia 08/08/2016   Controlled type 2 diabetes mellitus without complication, without long-term current use of insulin  (HCC) 08/08/2016   Essential hypertension 08/08/2016   History of CVA (cerebrovascular accident) 08/08/2016   Nuclear sclerotic cataract of left eye 07/07/2016   OAB (overactive bladder) 01/20/2016   Class 3 severe obesity with serious comorbidity and body mass index (BMI) of 45.0 to 49.9 in adult (HCC) 11/10/2015   IBS (irritable bowel syndrome) 07/16/2015   Insomnia 07/16/2015   Left ventricular hypertrophy 07/16/2015   Pulmonary hypertension (HCC) 07/16/2015   Primary osteoarthritis of left knee 03/31/2015   Acid reflux disease 03/09/2015   Aortic stenosis 03/09/2015   AR (allergic rhinitis) 03/09/2015   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   OSA (obstructive sleep apnea) 03/09/2015   Osteoporosis 03/09/2015   Vitamin D  deficiency 03/09/2015   Left tibialis posterior tendinitis 12/13/2013    Past Medical History:  Diagnosis Date   Acid reflux disease 03/09/2015   Aortic stenosis 03/09/2015   AR (allergic rhinitis) 03/09/2015   Arthritis  Back pain    Controlled type 2 diabetes mellitus without complication, without long-term current use of insulin  (HCC) 08/08/2016   Cough    Cystocele with rectocele 08/21/2016   Decreased hearing    Diabetes mellitus without complication (HCC)    Dry mouth    Easy bruising    Essential hypertension 08/08/2016   Heart murmur    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   History of CVA (cerebrovascular accident) 08/08/2016   Seen on MRI  from 08/2015   Hyperlipidemia    Hypertension    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   IBS (irritable bowel syndrome) 07/16/2015   Insomnia 07/16/2015   Joint pain    Knee pain    Left ventricular hypertrophy 07/16/2015   Leg cramping    right leg   Mixed hyperlipidemia 08/08/2016   Morbid obesity (HCC) 11/10/2015   Nasal discharge    Nuclear sclerotic cataract of left eye 07/07/2016   OAB (overactive bladder) 01/20/2016   OSA (obstructive sleep apnea) 03/09/2015   Osteoporosis 03/09/2015   Primary osteoarthritis of left knee 03/31/2015   Pulmonary hypertension (HCC) 07/16/2015   Red eyes    Right kidney stone    Shortness of breath on exertion    Skin cancer 11/04/2020   Sleep apnea    Stroke (HCC)    Swelling of both lower extremities    UTI (urinary tract infection)    Vitamin D  deficiency 03/09/2015    Past Surgical History:  Procedure Laterality Date   ABDOMINAL HYSTERECTOMY  09/14/2016   CATARACT EXTRACTION Left    CATARACT EXTRACTION Left 06/2016   COLPORRHAPHY N/A 09/14/2016   [facility]   CYSTOCELE REPAIR     FRACTURE SURGERY Left 1953   L arm   FRACTURE SURGERY Left 1985   L ankle   HERNIA REPAIR  2004   JOINT REPLACEMENT Right 2012   R TKA   JOINT REPLACEMENT Left 2017   KIDNEY STONE SURGERY     LAPAROSCOPIC ASSISTED VAGINAL HYSTERECTOMY N/A 09/14/2016   [facility]   R foot/ankle Right 2013 and 2014   plate and screws lateral foot and tendon removal medial foot in 2013, revision in 2014   RECTOCELE REPAIR     URETERAL STENT PLACEMENT     URETEROSCOPY      Social History   Tobacco Use   Smoking status: Never    Passive exposure: Never   Smokeless tobacco: Never  Vaping Use   Vaping status: Never Used  Substance Use Topics   Alcohol use: Yes    Alcohol/week: 2.0 - 3.0 standard drinks of alcohol    Types: 2 - 3 Glasses of wine per week    Comment: couple of glasses per week   Drug use: No    Family History  Problem Relation Age of Onset   Hyperlipidemia Mother     Heart disease Mother    Hypertension Mother    Stroke Mother    Thyroid  disease Mother    Hypertension Father    Heart disease Father     Allergies  Allergen Reactions   Latex Itching   Ciprofloxacin Hives   Metronidazole Hives   Tape Rash and Itching    Redness of skin    Medication list has been reviewed and updated.  Current Outpatient Medications on File Prior to Visit  Medication Sig Dispense Refill   Accu-Chek Softclix Lancets lancets USE AS DIRECTED TO CHECK BLOOD GLUCOSE ONCE DAILY 100 each 3   Acetaminophen  (TYLENOL   ARTHRITIS PAIN PO) Take 650 mg by mouth every 8 (eight) hours as needed (PAIN).     Ascorbic Acid  (VITAMIN C  WITH ROSE HIPS) 500 MG tablet Take 500 mg by mouth daily.     aspirin  EC 81 MG tablet Take 1 tablet (81 mg total) by mouth daily. Swallow whole. 120 tablet 3   blood glucose meter kit and supplies KIT Dispense based on patient and insurance preference. Use once daily to monitor glucose as needed (FOR ICD-9 250.00, 250.01). (Patient taking differently: Inject 1 each into the skin as directed. Dispense based on patient and insurance preference. Use once daily to monitor glucose as needed (FOR ICD-9 250.00, 250.01).) 1 each 0   CALCIUM  PO Take 1 tablet by mouth daily.     Cholecalciferol (VITAMIN D3) 2000 units TABS Take 2 tablets by mouth in the morning. Take one tablet in the AM     clopidogrel  (PLAVIX ) 75 MG tablet Take 1 tablet (75 mg total) by mouth daily. 90 tablet 1   clotrimazole -betamethasone  (LOTRISONE ) cream Apply to plantar foot daily 45 g 3   CRANBERRY EXTRACT PO Take 25,000 mg by mouth 2 (two) times daily.     cycloSPORINE  (RESTASIS ) 0.05 % ophthalmic emulsion Place 1 drop into both eyes 2 (two) times daily. (Patient not taking: Reported on 04/27/2023) 16.5 mL 3   cycloSPORINE  (RESTASIS ) 0.05 % ophthalmic emulsion Place 1 drop into both eyes 2 (two) times daily. 60 each 2   desmopressin  (DDAVP ) 0.2 MG tablet Take 1 tablet (0.2 mg total) by  mouth at bedtime. 90 tablet 3   desonide  (DESOWEN ) 0.05 % cream Apply as directed to affected area. 15 g 0   econazole nitrate 1 % cream Apply 1 Application topically as needed (skin irritation).     estradiol  (ESTRACE ) 0.1 MG/GM vaginal cream Apply 0.5 grams vaginally nightly for 2 weeks, then apply 0.5 grams 2 times weekly 42.5 g 3   fluticasone  (FLONASE ) 50 MCG/ACT nasal spray Place 2 sprays into both nostrils daily. 48 g 3   glucose blood test strip Use as directed to check blood glucose daily. 100 each 12   losartan  (COZAAR ) 50 MG tablet Take 1 & 1/2 tablets (75 mg total) by mouth daily. 135 tablet 3   metFORMIN  (GLUCOPHAGE ) 500 MG tablet Take 1 tablet (500 mg total) by mouth 2 (two) times daily with a meal. (Patient taking differently: Take 500 mg by mouth daily with breakfast.) 180 tablet 3   mirabegron  ER (MYRBETRIQ ) 50 MG TB24 tablet Take 1 tablet (50 mg total) by mouth daily. 90 tablet 3   omega-3 acid ethyl esters (LOVAZA ) 1 g capsule Take 2 capsules (2 g total) by mouth 2 (two) times daily. 360 capsule 1   OXYCODONE  HCL PO Take 1 tablet by mouth as needed (pain).     pantoprazole  (PROTONIX ) 40 MG tablet Take 1 tablet (40 mg total) by mouth 2 (two) times daily. 180 tablet 3   Polyethyl Glycol-Propyl Glycol (SYSTANE OP) Apply 1 tablet to eye daily.     PSYLLIUM PO Take 1 tablet by mouth daily.     Semaglutide ,0.25 or 0.5MG /DOS, (OZEMPIC , 0.25 OR 0.5 MG/DOSE,) 2 MG/3ML SOPN 0.25 mg weekly for 4 weeks, then 0.5 mg     simvastatin  (ZOCOR ) 20 MG tablet Take 1 tablet (20 mg total) by mouth at bedtime. 90 tablet 1   solifenacin  (VESICARE ) 10 MG tablet Take 1 tablet (10 mg total) by mouth daily. *Need appointment for future refills.* 30 tablet  0   solifenacin  (VESICARE ) 10 MG tablet Take 1 tablet (10 mg total) by mouth daily. Swallow whole do not crush, chew, or split. 30 tablet 3   Vitamin D , Ergocalciferol , (DRISDOL ) 1.25 MG (50000 UNIT) CAPS capsule Take 1 capsule (50,000 Units total) by  mouth every 7 (seven) days. 4 capsule 0   No current facility-administered medications on file prior to visit.    Review of Systems:  As per HPI- otherwise negative.   Physical Examination: There were no vitals filed for this visit. There were no vitals filed for this visit. There is no height or weight on file to calculate BMI. Ideal Body Weight:    GEN: no acute distress. HEENT: Atraumatic, Normocephalic.  Ears and Nose: No external deformity. CV: RRR, No M/G/R. No JVD. No thrill. No extra heart sounds. PULM: CTA B, no wheezes, crackles, rhonchi. No retractions. No resp. distress. No accessory muscle use. ABD: S, NT, ND, +BS. No rebound. No HSM. EXTR: No c/c/e PSYCH: Normally interactive. Conversant.  Foot exam  Assessment and Plan: ***  Signed Gates Kasal, MD

## 2023-06-20 NOTE — Patient Instructions (Incomplete)
 It was good to see you again today You might try a pair of [place] shoes and see if this helps with your feet- seeing your ortho is a good idea   Use the Ozemipc 0.5 mg for 4 weeks- then we can try going up to 1 mg.  Let me know how this works for you If you tolerate the 1mg  well we can try 2mg    Please see me in about 3 months for A1c recheck

## 2023-06-21 ENCOUNTER — Ambulatory Visit: Admitting: Family Medicine

## 2023-06-21 ENCOUNTER — Encounter: Payer: Self-pay | Admitting: Family Medicine

## 2023-06-21 ENCOUNTER — Other Ambulatory Visit (HOSPITAL_BASED_OUTPATIENT_CLINIC_OR_DEPARTMENT_OTHER): Payer: Self-pay

## 2023-06-21 VITALS — BP 130/76 | HR 76 | Ht 61.0 in | Wt 244.4 lb

## 2023-06-21 DIAGNOSIS — G8929 Other chronic pain: Secondary | ICD-10-CM

## 2023-06-21 DIAGNOSIS — M79673 Pain in unspecified foot: Secondary | ICD-10-CM | POA: Diagnosis not present

## 2023-06-21 DIAGNOSIS — I10 Essential (primary) hypertension: Secondary | ICD-10-CM

## 2023-06-21 DIAGNOSIS — Z7985 Long-term (current) use of injectable non-insulin antidiabetic drugs: Secondary | ICD-10-CM | POA: Diagnosis not present

## 2023-06-21 DIAGNOSIS — Z7984 Long term (current) use of oral hypoglycemic drugs: Secondary | ICD-10-CM

## 2023-06-21 DIAGNOSIS — E1169 Type 2 diabetes mellitus with other specified complication: Secondary | ICD-10-CM

## 2023-06-21 DIAGNOSIS — E119 Type 2 diabetes mellitus without complications: Secondary | ICD-10-CM

## 2023-06-21 MED ORDER — SEMAGLUTIDE (1 MG/DOSE) 4 MG/3ML ~~LOC~~ SOPN
1.0000 mg | PEN_INJECTOR | SUBCUTANEOUS | 2 refills | Status: DC
Start: 2023-06-21 — End: 2023-07-03
  Filled 2023-06-21: qty 3, 28d supply, fill #0

## 2023-06-21 MED ORDER — METFORMIN HCL 500 MG PO TABS
500.0000 mg | ORAL_TABLET | Freq: Every day | ORAL | 3 refills | Status: DC
  Filled 2023-06-21: qty 90, 90d supply, fill #0

## 2023-06-21 MED ORDER — LOSARTAN POTASSIUM 50 MG PO TABS
75.0000 mg | ORAL_TABLET | Freq: Every day | ORAL | 3 refills | Status: DC
  Filled 2023-06-21 – 2023-07-25 (×2): qty 135, 90d supply, fill #0
  Filled 2023-10-23: qty 135, 90d supply, fill #1

## 2023-07-02 ENCOUNTER — Emergency Department (HOSPITAL_BASED_OUTPATIENT_CLINIC_OR_DEPARTMENT_OTHER)

## 2023-07-02 ENCOUNTER — Encounter: Payer: Self-pay | Admitting: Family Medicine

## 2023-07-02 ENCOUNTER — Encounter (HOSPITAL_BASED_OUTPATIENT_CLINIC_OR_DEPARTMENT_OTHER): Payer: Self-pay | Admitting: Emergency Medicine

## 2023-07-02 ENCOUNTER — Inpatient Hospital Stay (HOSPITAL_BASED_OUTPATIENT_CLINIC_OR_DEPARTMENT_OTHER)
Admission: EM | Admit: 2023-07-02 | Discharge: 2023-07-08 | DRG: 243 | Disposition: A | Discharge status: Home / Self Care | Attending: Family Medicine | Admitting: Family Medicine

## 2023-07-02 ENCOUNTER — Other Ambulatory Visit: Payer: Self-pay

## 2023-07-02 DIAGNOSIS — N390 Urinary tract infection, site not specified: Secondary | ICD-10-CM | POA: Diagnosis present

## 2023-07-02 DIAGNOSIS — Z881 Allergy status to other antibiotic agents status: Secondary | ICD-10-CM

## 2023-07-02 DIAGNOSIS — Z7982 Long term (current) use of aspirin: Secondary | ICD-10-CM | POA: Diagnosis not present

## 2023-07-02 DIAGNOSIS — Z6841 Body Mass Index (BMI) 40.0 and over, adult: Secondary | ICD-10-CM | POA: Diagnosis not present

## 2023-07-02 DIAGNOSIS — I110 Hypertensive heart disease with heart failure: Secondary | ICD-10-CM | POA: Diagnosis present

## 2023-07-02 DIAGNOSIS — B9620 Unspecified Escherichia coli [E. coli] as the cause of diseases classified elsewhere: Secondary | ICD-10-CM | POA: Diagnosis present

## 2023-07-02 DIAGNOSIS — G4733 Obstructive sleep apnea (adult) (pediatric): Secondary | ICD-10-CM | POA: Diagnosis present

## 2023-07-02 DIAGNOSIS — R002 Palpitations: Secondary | ICD-10-CM | POA: Diagnosis present

## 2023-07-02 DIAGNOSIS — Z8673 Personal history of transient ischemic attack (TIA), and cerebral infarction without residual deficits: Secondary | ICD-10-CM

## 2023-07-02 DIAGNOSIS — Z823 Family history of stroke: Secondary | ICD-10-CM

## 2023-07-02 DIAGNOSIS — R0602 Shortness of breath: Secondary | ICD-10-CM | POA: Diagnosis not present

## 2023-07-02 DIAGNOSIS — Z7984 Long term (current) use of oral hypoglycemic drugs: Secondary | ICD-10-CM | POA: Diagnosis not present

## 2023-07-02 DIAGNOSIS — I509 Heart failure, unspecified: Secondary | ICD-10-CM | POA: Diagnosis not present

## 2023-07-02 DIAGNOSIS — I4510 Unspecified right bundle-branch block: Secondary | ICD-10-CM | POA: Diagnosis present

## 2023-07-02 DIAGNOSIS — Z950 Presence of cardiac pacemaker: Secondary | ICD-10-CM | POA: Diagnosis not present

## 2023-07-02 DIAGNOSIS — I472 Ventricular tachycardia, unspecified: Secondary | ICD-10-CM | POA: Diagnosis not present

## 2023-07-02 DIAGNOSIS — I10 Essential (primary) hypertension: Secondary | ICD-10-CM | POA: Diagnosis present

## 2023-07-02 DIAGNOSIS — Z91040 Latex allergy status: Secondary | ICD-10-CM

## 2023-07-02 DIAGNOSIS — K219 Gastro-esophageal reflux disease without esophagitis: Secondary | ICD-10-CM | POA: Diagnosis present

## 2023-07-02 DIAGNOSIS — Z85828 Personal history of other malignant neoplasm of skin: Secondary | ICD-10-CM

## 2023-07-02 DIAGNOSIS — I4430 Unspecified atrioventricular block: Secondary | ICD-10-CM | POA: Diagnosis not present

## 2023-07-02 DIAGNOSIS — Z7985 Long-term (current) use of injectable non-insulin antidiabetic drugs: Secondary | ICD-10-CM | POA: Diagnosis not present

## 2023-07-02 DIAGNOSIS — I350 Nonrheumatic aortic (valve) stenosis: Secondary | ICD-10-CM | POA: Diagnosis present

## 2023-07-02 DIAGNOSIS — Z79899 Other long term (current) drug therapy: Secondary | ICD-10-CM | POA: Diagnosis not present

## 2023-07-02 DIAGNOSIS — R001 Bradycardia, unspecified: Secondary | ICD-10-CM | POA: Diagnosis present

## 2023-07-02 DIAGNOSIS — E66813 Obesity, class 3: Secondary | ICD-10-CM | POA: Diagnosis present

## 2023-07-02 DIAGNOSIS — I272 Pulmonary hypertension, unspecified: Secondary | ICD-10-CM | POA: Diagnosis present

## 2023-07-02 DIAGNOSIS — E781 Pure hyperglyceridemia: Secondary | ICD-10-CM | POA: Diagnosis present

## 2023-07-02 DIAGNOSIS — E119 Type 2 diabetes mellitus without complications: Secondary | ICD-10-CM | POA: Diagnosis present

## 2023-07-02 DIAGNOSIS — I440 Atrioventricular block, first degree: Secondary | ICD-10-CM | POA: Diagnosis present

## 2023-07-02 DIAGNOSIS — Z7902 Long term (current) use of antithrombotics/antiplatelets: Secondary | ICD-10-CM | POA: Diagnosis not present

## 2023-07-02 DIAGNOSIS — I441 Atrioventricular block, second degree: Principal | ICD-10-CM | POA: Insufficient documentation

## 2023-07-02 DIAGNOSIS — E785 Hyperlipidemia, unspecified: Secondary | ICD-10-CM | POA: Diagnosis not present

## 2023-07-02 DIAGNOSIS — N3000 Acute cystitis without hematuria: Secondary | ICD-10-CM | POA: Diagnosis not present

## 2023-07-02 DIAGNOSIS — I2510 Atherosclerotic heart disease of native coronary artery without angina pectoris: Secondary | ICD-10-CM

## 2023-07-02 DIAGNOSIS — Z90710 Acquired absence of both cervix and uterus: Secondary | ICD-10-CM

## 2023-07-02 DIAGNOSIS — R079 Chest pain, unspecified: Secondary | ICD-10-CM | POA: Diagnosis not present

## 2023-07-02 DIAGNOSIS — I5032 Chronic diastolic (congestive) heart failure: Secondary | ICD-10-CM | POA: Diagnosis present

## 2023-07-02 DIAGNOSIS — E782 Mixed hyperlipidemia: Secondary | ICD-10-CM | POA: Diagnosis not present

## 2023-07-02 DIAGNOSIS — N3281 Overactive bladder: Secondary | ICD-10-CM | POA: Diagnosis present

## 2023-07-02 DIAGNOSIS — I700 Atherosclerosis of aorta: Secondary | ICD-10-CM | POA: Diagnosis not present

## 2023-07-02 DIAGNOSIS — Z96653 Presence of artificial knee joint, bilateral: Secondary | ICD-10-CM | POA: Diagnosis present

## 2023-07-02 DIAGNOSIS — Z8249 Family history of ischemic heart disease and other diseases of the circulatory system: Secondary | ICD-10-CM

## 2023-07-02 LAB — COMPREHENSIVE METABOLIC PANEL WITH GFR
ALT: 21 U/L (ref 0–44)
AST: 24 U/L (ref 15–41)
Albumin: 4.1 g/dL (ref 3.5–5.0)
Alkaline Phosphatase: 63 U/L (ref 38–126)
Anion gap: 14 (ref 5–15)
BUN: 18 mg/dL (ref 8–23)
CO2: 23 mmol/L (ref 22–32)
Calcium: 9.4 mg/dL (ref 8.9–10.3)
Chloride: 100 mmol/L (ref 98–111)
Creatinine, Ser: 0.75 mg/dL (ref 0.44–1.00)
GFR, Estimated: >60 mL/min (ref >60)
Glucose, Bld: 174 mg/dL — ABNORMAL HIGH (ref 70–99)
Potassium: 4 mmol/L (ref 3.5–5.1)
Sodium: 137 mmol/L (ref 135–145)
Total Bilirubin: 0.5 mg/dL (ref 0.0–1.2)
Total Protein: 6.7 g/dL (ref 6.5–8.1)

## 2023-07-02 LAB — CBC WITH DIFFERENTIAL/PLATELET
Abs Immature Granulocytes: 0.04 10*3/uL (ref 0.00–0.07)
Basophils Absolute: 0.1 10*3/uL (ref 0.0–0.1)
Basophils Relative: 1 %
Eosinophils Absolute: 0.2 10*3/uL (ref 0.0–0.5)
Eosinophils Relative: 2 %
HCT: 37.4 % (ref 36.0–46.0)
Hemoglobin: 12.3 g/dL (ref 12.0–15.0)
Immature Granulocytes: 0 %
Lymphocytes Relative: 23 %
Lymphs Abs: 2.6 10*3/uL (ref 0.7–4.0)
MCH: 28.7 pg (ref 26.0–34.0)
MCHC: 32.9 g/dL (ref 30.0–36.0)
MCV: 87.2 fL (ref 80.0–100.0)
Monocytes Absolute: 0.7 10*3/uL (ref 0.1–1.0)
Monocytes Relative: 7 %
Neutro Abs: 7.6 10*3/uL (ref 1.7–7.7)
Neutrophils Relative %: 67 %
Platelets: 305 10*3/uL (ref 150–400)
RBC: 4.29 MIL/uL (ref 3.87–5.11)
RDW: 15.1 % (ref 11.5–15.5)
WBC: 11.3 10*3/uL — ABNORMAL HIGH (ref 4.0–10.5)
nRBC: 0 % (ref 0.0–0.2)

## 2023-07-02 LAB — URINALYSIS, ROUTINE W REFLEX MICROSCOPIC
Bilirubin Urine: NEGATIVE
Glucose, UA: NEGATIVE mg/dL
Hgb urine dipstick: NEGATIVE
Ketones, ur: NEGATIVE mg/dL
Leukocytes,Ua: NEGATIVE
Nitrite: POSITIVE — AB
Protein, ur: 100 mg/dL — AB
Specific Gravity, Urine: 1.025 (ref 1.005–1.030)
pH: 5.5 (ref 5.0–8.0)

## 2023-07-02 LAB — URINALYSIS, MICROSCOPIC (REFLEX)

## 2023-07-02 LAB — TROPONIN T, HIGH SENSITIVITY: Troponin T High Sensitivity: <15 ng/L (ref <19)

## 2023-07-02 LAB — LIPASE, BLOOD: Lipase: 33 U/L (ref 11–51)

## 2023-07-02 NOTE — Telephone Encounter (Signed)
 Received patient's message and was concerned.  Gave her a call and asked her to please go to the emergency department, I am not satisfied that these symptoms are caused by increasing her dose of Ozempic .  Patient states understanding and agreement, her husband will take her to the ER

## 2023-07-02 NOTE — ED Provider Notes (Signed)
 [place] [facility] Provider Note   CSN: [id number] Arrival date & time: 07/02/23  1800     History  Chief Complaint  Patient presents with   Shortness of Breath    Jasmin [name] is a 78 y.o. female.  The history is provided by the patient and medical records.  Shortness of Breath Jasmin [name] is a 78 y.o. female who presents to the Emergency Department complaining of shortness of breath.  She presents to the emergency department for evaluation of symptoms that started on Thursday afternoon.  She increased her dose of Ozempic  from 0.25 to 0.5 on Thursday morning.  Thursday afternoon she began to feel unwell with shortness of breath and dyspnea on exertion, worsening on Friday.  She also has had some lightheadedness, vision is feeling less clear than baseline and low energy.  She feels like these are side effects from the increased Ozempic  dose.  No associated fevers, chest pain, abdominal pain, nausea, vomiting.     Home Medications Prior to Admission medications   Medication Sig Start Date End Date Taking? Authorizing Provider  Accu-Chek Softclix Lancets lancets USE AS DIRECTED TO CHECK BLOOD GLUCOSE ONCE DAILY 04/06/23   Copland, Skipper Dumas, MD  Acetaminophen  (TYLENOL  ARTHRITIS PAIN PO) Take 650 mg by mouth every 8 (eight) hours as needed (PAIN).    [provider]  amoxicillin  (AMOXIL ) 500 MG tablet Take 500 mg by mouth 2 (two) times daily. 1 hr prior to dentist visit    [provider]  Ascorbic Acid  (VITAMIN C  WITH ROSE HIPS) 500 MG tablet Take 500 mg by mouth daily.    [provider]  aspirin  EC 81 MG tablet Take 1 tablet (81 mg total) by mouth daily. Swallow whole. 06/02/22   Copland, Skipper Dumas, MD  blood glucose meter kit and supplies KIT Dispense based on patient and insurance preference. Use once daily to monitor glucose as needed (FOR ICD-9 250.00, 250.01). Patient taking differently: Inject 1 each into the  skin as directed. Dispense based on patient and insurance preference. Use once daily to monitor glucose as needed (FOR ICD-9 250.00, 250.01). 12/04/19   Copland, Skipper Dumas, MD  CALCIUM  PO Take 1 tablet by mouth daily.    [provider]  Cholecalciferol (VITAMIN D3) 2000 units TABS Take 2 tablets by mouth in the morning. Take one tablet in the AM    [provider]  clopidogrel  (PLAVIX ) 75 MG tablet Take 1 tablet (75 mg total) by mouth daily. 02/13/23   Copland, Skipper Dumas, MD  clotrimazole -betamethasone  (LOTRISONE ) cream Apply to plantar foot daily 02/21/23     CRANBERRY EXTRACT PO Take 25,000 mg by mouth 2 (two) times daily.    [provider]  cycloSPORINE  (RESTASIS ) 0.05 % ophthalmic emulsion Place 1 drop into both eyes 2 (two) times daily. 10/19/22     cycloSPORINE  (RESTASIS ) 0.05 % ophthalmic emulsion Place 1 drop into both eyes 2 (two) times daily. 04/14/23     desmopressin  (DDAVP ) 0.2 MG tablet Take 1 tablet (0.2 mg total) by mouth at bedtime. 10/13/22     desonide  (DESOWEN ) 0.05 % cream Apply as directed to affected area. 06/06/23     econazole nitrate 1 % cream Apply 1 Application topically as needed (skin irritation).    [provider]  estradiol  (ESTRACE ) 0.1 MG/GM vaginal cream Apply 0.5 grams vaginally nightly for 2 weeks, then apply 0.5 grams 2 times weekly 02/06/23     fluticasone  (FLONASE ) 50 MCG/ACT  nasal spray Place 2 sprays into both nostrils daily. 04/24/23   Copland, Skipper Dumas, MD  glucose blood test strip Use as directed to check blood glucose daily. 01/10/23 01/09/24  Copland, Skipper Dumas, MD  losartan  (COZAAR ) 50 MG tablet Take 1 & 1/2 tablets (75 mg total) by mouth daily. 06/21/23 06/20/24  Copland, Skipper Dumas, MD  metFORMIN  (GLUCOPHAGE ) 500 MG tablet Take 1 tablet (500 mg total) by mouth daily with breakfast. 06/21/23   Copland, Skipper Dumas, MD  mirabegron  ER (MYRBETRIQ ) 50 MG TB24 tablet Take 1 tablet (50 mg total) by mouth daily. 05/23/23   Copland,  Jessica C, MD  omega-3 acid ethyl esters (LOVAZA ) 1 g capsule Take 2 capsules (2 g total) by mouth 2 (two) times daily. 05/16/23   Copland, Skipper Dumas, MD  OXYCODONE  HCL PO Take 1 tablet by mouth as needed (pain).    [provider]  pantoprazole  (PROTONIX ) 40 MG tablet Take 1 tablet (40 mg total) by mouth 2 (two) times daily. 09/01/22     Polyethyl Glycol-Propyl Glycol (SYSTANE OP) Apply 1 tablet to eye daily.    [provider]  PSYLLIUM PO Take 1 tablet by mouth daily.    [provider]  Semaglutide , 1 MG/DOSE, 4 MG/3ML SOPN Inject 1 mg into the skin once a week. 06/21/23   Copland, Skipper Dumas, MD  simvastatin  (ZOCOR ) 20 MG tablet Take 1 tablet (20 mg total) by mouth at bedtime. 03/27/23   Copland, Skipper Dumas, MD  solifenacin  (VESICARE ) 10 MG tablet Take 1 tablet (10 mg total) by mouth daily. *Need appointment for future refills.* 01/11/23     solifenacin  (VESICARE ) 10 MG tablet Take 1 tablet (10 mg total) by mouth daily. Swallow whole do not crush, chew, or split. 06/02/23     Vitamin D , Ergocalciferol , (DRISDOL ) 1.25 MG (50000 UNIT) CAPS capsule Take 1 capsule (50,000 Units total) by mouth every 7 (seven) days. 03/15/22   [name] Mesi D, MD      Allergies    Latex, Ciprofloxacin, Metronidazole, and Tape    Review of Systems   Review of Systems  Respiratory:  Positive for shortness of breath.   All other systems reviewed and are negative.   Physical Exam Updated Vital Signs BP (!) 145/77 (BP Location: Left Arm)   Pulse (!) 42   Temp 97.9 F (36.6 C) (Oral)   Resp 18   Ht 5\' 1"  (1.549 m)   Wt 110.9 kg   SpO2 100%   BMI 46.18 kg/m  Physical Exam Vitals and nursing note reviewed.  Constitutional:      Appearance: She is well-developed.  HENT:     Head: Normocephalic and atraumatic.  Cardiovascular:     Rate and Rhythm: Regular rhythm. Bradycardia present.     Heart sounds: Murmur heard.  Pulmonary:     Effort: Pulmonary effort is normal. No respiratory  distress.     Breath sounds: Normal breath sounds.  Abdominal:     Palpations: Abdomen is soft.     Tenderness: There is no abdominal tenderness. There is no guarding or rebound.  Musculoskeletal:        General: No tenderness.     Comments: 1+ pitting edema to BLE  Skin:    General: Skin is warm and dry.  Neurological:     Mental Status: She is alert and oriented to person, place, and time.     Comments: No asymmetry of facial movement.  Visual fields grossly intact.  5/5 strength  in all four extremities.    Psychiatric:        Behavior: Behavior normal.     ED Results / Procedures / Treatments   Labs (all labs ordered are listed, but only abnormal results are displayed) Labs Reviewed  CBC WITH DIFFERENTIAL/PLATELET - Abnormal; Notable for the following components:      Result Value   WBC 11.3 (*)    All other components within normal limits  COMPREHENSIVE METABOLIC PANEL WITH GFR - Abnormal; Notable for the following components:   Glucose, Bld 174 (*)    All other components within normal limits  URINALYSIS, ROUTINE W REFLEX MICROSCOPIC - Abnormal; Notable for the following components:   Protein, ur 100 (*)    Nitrite POSITIVE (*)    All other components within normal limits  URINALYSIS, MICROSCOPIC (REFLEX) - Abnormal; Notable for the following components:   Bacteria, UA MANY (*)    All other components within normal limits  LIPASE, BLOOD  TROPONIN T, HIGH SENSITIVITY  TROPONIN T, HIGH SENSITIVITY    EKG EKG Interpretation Date/Time:  Sunday Jul 02 2023 18:09:54 EDT Ventricular Rate:  49 PR Interval:  209 QRS Duration:  144 QT Interval:  584 QTC Calculation: 528 R Axis:   89  Text Interpretation: Sinus bradycardia Right bundle branch block No previous ECGs available Confirmed by Kelsey Patricia [phone]) on 07/02/2023 11:51:40 PM  Radiology DG Chest 2 View Result Date: 07/02/2023 CLINICAL DATA:  Chest pain, SOB. EXAM: CHEST - 2 VIEW COMPARISON:  01/17/2022  FINDINGS: The heart size and mediastinal contours are within normal limits. No pneumothorax or pleural effusion. Lower lung interstitial prominence consistent with scarring or subsegmental atelectasis. Calcified aorta. Thoracic degenerative changes. IMPRESSION: Bibasilar subsegmental atelectasis or scarring. Electronically Signed   By: Sydell Eva M.D.   On: 07/02/2023 20:26    Procedures Procedures    Medications Ordered in ED Medications - No data to display  ED Course/ Medical Decision Making/ A&P Clinical Course as of 07/03/23 0441  Mon Jul 03, 2023  0406 Cardiology Dr. Jacklyn Mash notified of the patient's arrival from [facility].   [RS]    Clinical Course User Index [RS] Sponseller, Adelle Agent, PA-C                                 Medical Decision Making Amount and/or Complexity of Data Reviewed Labs: ordered. Radiology: ordered.  Risk Prescription drug management. Decision regarding hospitalization.   Patient with history of hypertension, diabetes here for evaluation of dyspnea on exertion, dizziness.  EKG with possible 2-1 AV block with buried P waves in the QRS.  On exertion patient's heart rate increases to the upper 50s/low 60s.  Repeat EKG with first-degree AV block.  BNP is elevated.  Will start with furosemide for diuresis as she does appear to be mildly volume up.  Concern for symptomatic bradycardia driving her dyspnea on exertion.  Discussed with Dr. Jacklyn Mash with cardiology, recommendation for medicine admission and cardiology consult.  Patient and husband updated regarding findings of studies and recommendation for admission and they are in agreement with plan.  Discussed with Dr. Alison Irvine in the [facility] emergency department, who accepts the patient in ED to ED transfer for expedited cardiology evaluation.        Final Clinical Impression(s) / ED Diagnoses Final diagnoses:  None    Rx / DC Orders ED Discharge Orders     None  Kelsey Patricia,  MD 07/03/23 [phone]

## 2023-07-02 NOTE — ED Provider Notes (Incomplete)
 [place] [facility] Provider Note   CSN: [id number] Arrival date & time: 07/02/23  1800     History {Add pertinent medical, surgical, social history, OB history to HPI:1} Chief Complaint  Patient presents with  . Shortness of Breath    Jasmin [name] is a 78 y.o. female.  The history is provided by the patient and medical records.  Shortness of Breath Jasmin [name] is a 78 y.o. female who presents to the Emergency Department complaining of *** T w fine Ozempic  Thursday 0.5 (previously on 0.25 two weeks ago) Thursday afternoon.  Sob/doe.  Friday was slightly worse Saturday had sob. Mood change/irritable, lightheaded, vision less clear, low energy. Messaged pcp, re      Home Medications Prior to Admission medications   Medication Sig Start Date End Date Taking? Authorizing Provider  Accu-Chek Softclix Lancets lancets USE AS DIRECTED TO CHECK BLOOD GLUCOSE ONCE DAILY 04/06/23   Copland, Skipper Dumas, MD  Acetaminophen  (TYLENOL  ARTHRITIS PAIN PO) Take 650 mg by mouth every 8 (eight) hours as needed (PAIN).    [provider]  amoxicillin  (AMOXIL ) 500 MG tablet Take 500 mg by mouth 2 (two) times daily. 1 hr prior to dentist visit    [provider]  Ascorbic Acid  (VITAMIN C  WITH ROSE HIPS) 500 MG tablet Take 500 mg by mouth daily.    [provider]  aspirin  EC 81 MG tablet Take 1 tablet (81 mg total) by mouth daily. Swallow whole. 06/02/22   Copland, Skipper Dumas, MD  blood glucose meter kit and supplies KIT Dispense based on patient and insurance preference. Use once daily to monitor glucose as needed (FOR ICD-9 250.00, 250.01). Patient taking differently: Inject 1 each into the skin as directed. Dispense based on patient and insurance preference. Use once daily to monitor glucose as needed (FOR ICD-9 250.00, 250.01). 12/04/19   Copland, Skipper Dumas, MD  CALCIUM  PO Take 1 tablet by mouth daily.    [provider]   Cholecalciferol (VITAMIN D3) 2000 units TABS Take 2 tablets by mouth in the morning. Take one tablet in the AM    [provider]  clopidogrel  (PLAVIX ) 75 MG tablet Take 1 tablet (75 mg total) by mouth daily. 02/13/23   Copland, Skipper Dumas, MD  clotrimazole -betamethasone  (LOTRISONE ) cream Apply to plantar foot daily 02/21/23     CRANBERRY EXTRACT PO Take 25,000 mg by mouth 2 (two) times daily.    [provider]  cycloSPORINE  (RESTASIS ) 0.05 % ophthalmic emulsion Place 1 drop into both eyes 2 (two) times daily. 10/19/22     cycloSPORINE  (RESTASIS ) 0.05 % ophthalmic emulsion Place 1 drop into both eyes 2 (two) times daily. 04/14/23     desmopressin  (DDAVP ) 0.2 MG tablet Take 1 tablet (0.2 mg total) by mouth at bedtime. 10/13/22     desonide  (DESOWEN ) 0.05 % cream Apply as directed to affected area. 06/06/23     econazole nitrate 1 % cream Apply 1 Application topically as needed (skin irritation).    [provider]  estradiol  (ESTRACE ) 0.1 MG/GM vaginal cream Apply 0.5 grams vaginally nightly for 2 weeks, then apply 0.5 grams 2 times weekly 02/06/23     fluticasone  (FLONASE ) 50 MCG/ACT nasal spray Place 2 sprays into both nostrils daily. 04/24/23   Copland, Skipper Dumas, MD  glucose blood test strip Use as directed to check blood glucose daily. 01/10/23 01/09/24  Copland, Skipper Dumas, MD  losartan  (COZAAR ) 50 MG tablet Take 1 & 1/2  tablets (75 mg total) by mouth daily. 06/21/23 06/20/24  Copland, Skipper Dumas, MD  metFORMIN  (GLUCOPHAGE ) 500 MG tablet Take 1 tablet (500 mg total) by mouth daily with breakfast. 06/21/23   Copland, Skipper Dumas, MD  mirabegron  ER (MYRBETRIQ ) 50 MG TB24 tablet Take 1 tablet (50 mg total) by mouth daily. 05/23/23   Copland, Jessica C, MD  omega-3 acid ethyl esters (LOVAZA ) 1 g capsule Take 2 capsules (2 g total) by mouth 2 (two) times daily. 05/16/23   Copland, Skipper Dumas, MD  OXYCODONE  HCL PO Take 1 tablet by mouth as needed (pain).    [provider]   pantoprazole  (PROTONIX ) 40 MG tablet Take 1 tablet (40 mg total) by mouth 2 (two) times daily. 09/01/22     Polyethyl Glycol-Propyl Glycol (SYSTANE OP) Apply 1 tablet to eye daily.    [provider]  PSYLLIUM PO Take 1 tablet by mouth daily.    [provider]  Semaglutide , 1 MG/DOSE, 4 MG/3ML SOPN Inject 1 mg into the skin once a week. 06/21/23   Copland, Skipper Dumas, MD  simvastatin  (ZOCOR ) 20 MG tablet Take 1 tablet (20 mg total) by mouth at bedtime. 03/27/23   Copland, Skipper Dumas, MD  solifenacin  (VESICARE ) 10 MG tablet Take 1 tablet (10 mg total) by mouth daily. *Need appointment for future refills.* 01/11/23     solifenacin  (VESICARE ) 10 MG tablet Take 1 tablet (10 mg total) by mouth daily. Swallow whole do not crush, chew, or split. 06/02/23     Vitamin D , Ergocalciferol , (DRISDOL ) 1.25 MG (50000 UNIT) CAPS capsule Take 1 capsule (50,000 Units total) by mouth every 7 (seven) days. 03/15/22   [name] Mesi D, MD      Allergies    Latex, Ciprofloxacin, Metronidazole, and Tape    Review of Systems   Review of Systems  Respiratory:  Positive for shortness of breath.     Physical Exam Updated Vital Signs BP (!) 145/77 (BP Location: Left Arm)   Pulse (!) 42   Temp 97.9 F (36.6 C) (Oral)   Resp 18   Ht 5\' 1"  (1.549 m)   Wt 110.9 kg   SpO2 100%   BMI 46.18 kg/m  Physical Exam  ED Results / Procedures / Treatments   Labs (all labs ordered are listed, but only abnormal results are displayed) Labs Reviewed  CBC WITH DIFFERENTIAL/PLATELET - Abnormal; Notable for the following components:      Result Value   WBC 11.3 (*)    All other components within normal limits  COMPREHENSIVE METABOLIC PANEL WITH GFR - Abnormal; Notable for the following components:   Glucose, Bld 174 (*)    All other components within normal limits  URINALYSIS, ROUTINE W REFLEX MICROSCOPIC - Abnormal; Notable for the following components:   Protein, ur 100 (*)    Nitrite POSITIVE (*)    All  other components within normal limits  URINALYSIS, MICROSCOPIC (REFLEX) - Abnormal; Notable for the following components:   Bacteria, UA MANY (*)    All other components within normal limits  LIPASE, BLOOD  TROPONIN T, HIGH SENSITIVITY  TROPONIN T, HIGH SENSITIVITY    EKG EKG Interpretation Date/Time:  Sunday Jul 02 2023 18:09:54 EDT Ventricular Rate:  49 PR Interval:  209 QRS Duration:  144 QT Interval:  584 QTC Calculation: 528 R Axis:   89  Text Interpretation: Sinus bradycardia Right bundle branch block No previous ECGs available Confirmed by Kelsey Patricia [phone]) on 07/02/2023 11:51:40 PM  Radiology  DG Chest 2 View Result Date: 07/02/2023 CLINICAL DATA:  Chest pain, SOB. EXAM: CHEST - 2 VIEW COMPARISON:  01/17/2022 FINDINGS: The heart size and mediastinal contours are within normal limits. No pneumothorax or pleural effusion. Lower lung interstitial prominence consistent with scarring or subsegmental atelectasis. Calcified aorta. Thoracic degenerative changes. IMPRESSION: Bibasilar subsegmental atelectasis or scarring. Electronically Signed   By: Sydell Eva M.D.   On: 07/02/2023 20:26    Procedures Procedures  {Document cardiac monitor, telemetry assessment procedure when appropriate:1}  Medications Ordered in ED Medications - No data to display  ED Course/ Medical Decision Making/ A&P   {   Click here for ABCD2, HEART and other calculatorsREFRESH Note before signing :1}                              Medical Decision Making Amount and/or Complexity of Data Reviewed Labs: ordered. Radiology: ordered.   ***  {Document critical care time when appropriate:1} {Document review of labs and clinical decision tools ie heart score, Chads2Vasc2 etc:1}  {Document your independent review of radiology images, and any outside records:1} {Document your discussion with family members, caretakers, and with consultants:1} {Document social determinants of health affecting  pt's care:1} {Document your decision making why or why not admission, treatments were needed:1} Final Clinical Impression(s) / ED Diagnoses Final diagnoses:  None    Rx / DC Orders ED Discharge Orders     None

## 2023-07-02 NOTE — ED Triage Notes (Signed)
 Pt reports SHOB that has been worsening since Thurs after she started her first dose of Ozempic ; Has multiple other sxs that began Saturday including: rapid HR, HA, abd pain, trouble sleeping, mood changes, chest heaviness, vision changes ("not as clear as before"), general weakness; denies pain at this time

## 2023-07-03 ENCOUNTER — Other Ambulatory Visit: Payer: Self-pay

## 2023-07-03 ENCOUNTER — Encounter (HOSPITAL_COMMUNITY): Payer: Self-pay | Admitting: Internal Medicine

## 2023-07-03 ENCOUNTER — Observation Stay (HOSPITAL_COMMUNITY)

## 2023-07-03 DIAGNOSIS — I472 Ventricular tachycardia, unspecified: Secondary | ICD-10-CM | POA: Diagnosis not present

## 2023-07-03 DIAGNOSIS — R001 Bradycardia, unspecified: Secondary | ICD-10-CM | POA: Diagnosis present

## 2023-07-03 DIAGNOSIS — N390 Urinary tract infection, site not specified: Secondary | ICD-10-CM | POA: Insufficient documentation

## 2023-07-03 DIAGNOSIS — R0602 Shortness of breath: Secondary | ICD-10-CM | POA: Diagnosis not present

## 2023-07-03 DIAGNOSIS — K219 Gastro-esophageal reflux disease without esophagitis: Secondary | ICD-10-CM | POA: Diagnosis present

## 2023-07-03 DIAGNOSIS — Z79899 Other long term (current) drug therapy: Secondary | ICD-10-CM | POA: Diagnosis not present

## 2023-07-03 DIAGNOSIS — I4430 Unspecified atrioventricular block: Secondary | ICD-10-CM | POA: Diagnosis not present

## 2023-07-03 DIAGNOSIS — I509 Heart failure, unspecified: Secondary | ICD-10-CM

## 2023-07-03 DIAGNOSIS — G4733 Obstructive sleep apnea (adult) (pediatric): Secondary | ICD-10-CM | POA: Diagnosis present

## 2023-07-03 DIAGNOSIS — N3000 Acute cystitis without hematuria: Secondary | ICD-10-CM

## 2023-07-03 DIAGNOSIS — B9620 Unspecified Escherichia coli [E. coli] as the cause of diseases classified elsewhere: Secondary | ICD-10-CM | POA: Diagnosis present

## 2023-07-03 DIAGNOSIS — I441 Atrioventricular block, second degree: Secondary | ICD-10-CM | POA: Diagnosis present

## 2023-07-03 DIAGNOSIS — E781 Pure hyperglyceridemia: Secondary | ICD-10-CM | POA: Diagnosis present

## 2023-07-03 DIAGNOSIS — Z90710 Acquired absence of both cervix and uterus: Secondary | ICD-10-CM | POA: Diagnosis not present

## 2023-07-03 DIAGNOSIS — I4510 Unspecified right bundle-branch block: Secondary | ICD-10-CM | POA: Diagnosis present

## 2023-07-03 DIAGNOSIS — I10 Essential (primary) hypertension: Secondary | ICD-10-CM | POA: Diagnosis not present

## 2023-07-03 DIAGNOSIS — E119 Type 2 diabetes mellitus without complications: Secondary | ICD-10-CM

## 2023-07-03 DIAGNOSIS — I2510 Atherosclerotic heart disease of native coronary artery without angina pectoris: Secondary | ICD-10-CM

## 2023-07-03 DIAGNOSIS — I272 Pulmonary hypertension, unspecified: Secondary | ICD-10-CM | POA: Diagnosis present

## 2023-07-03 DIAGNOSIS — E66813 Obesity, class 3: Secondary | ICD-10-CM | POA: Diagnosis present

## 2023-07-03 DIAGNOSIS — Z7984 Long term (current) use of oral hypoglycemic drugs: Secondary | ICD-10-CM | POA: Diagnosis not present

## 2023-07-03 DIAGNOSIS — Z7982 Long term (current) use of aspirin: Secondary | ICD-10-CM | POA: Diagnosis not present

## 2023-07-03 DIAGNOSIS — Z6841 Body Mass Index (BMI) 40.0 and over, adult: Secondary | ICD-10-CM | POA: Diagnosis not present

## 2023-07-03 DIAGNOSIS — I350 Nonrheumatic aortic (valve) stenosis: Secondary | ICD-10-CM | POA: Diagnosis present

## 2023-07-03 DIAGNOSIS — I110 Hypertensive heart disease with heart failure: Secondary | ICD-10-CM | POA: Diagnosis present

## 2023-07-03 DIAGNOSIS — E782 Mixed hyperlipidemia: Secondary | ICD-10-CM

## 2023-07-03 DIAGNOSIS — I440 Atrioventricular block, first degree: Secondary | ICD-10-CM | POA: Diagnosis present

## 2023-07-03 DIAGNOSIS — R002 Palpitations: Secondary | ICD-10-CM | POA: Diagnosis present

## 2023-07-03 DIAGNOSIS — E785 Hyperlipidemia, unspecified: Secondary | ICD-10-CM

## 2023-07-03 DIAGNOSIS — Z7985 Long-term (current) use of injectable non-insulin antidiabetic drugs: Secondary | ICD-10-CM | POA: Diagnosis not present

## 2023-07-03 DIAGNOSIS — Z7902 Long term (current) use of antithrombotics/antiplatelets: Secondary | ICD-10-CM | POA: Diagnosis not present

## 2023-07-03 DIAGNOSIS — I5032 Chronic diastolic (congestive) heart failure: Secondary | ICD-10-CM | POA: Diagnosis present

## 2023-07-03 HISTORY — DX: Heart failure, unspecified: I50.9

## 2023-07-03 LAB — LIPID PANEL
Cholesterol: 127 mg/dL (ref 0–200)
HDL: 36 mg/dL — ABNORMAL LOW (ref >40)
LDL Cholesterol: 64 mg/dL (ref 0–99)
Total CHOL/HDL Ratio: 3.5 ratio
Triglycerides: 134 mg/dL (ref <150)
VLDL: 27 mg/dL (ref 0–40)

## 2023-07-03 LAB — CBC WITH DIFFERENTIAL/PLATELET
Abs Immature Granulocytes: 0.04 10*3/uL (ref 0.00–0.07)
Basophils Absolute: 0 10*3/uL (ref 0.0–0.1)
Basophils Relative: 0 %
Eosinophils Absolute: 0.1 10*3/uL (ref 0.0–0.5)
Eosinophils Relative: 1 %
HCT: 39.1 % (ref 36.0–46.0)
Hemoglobin: 12.4 g/dL (ref 12.0–15.0)
Immature Granulocytes: 0 %
Lymphocytes Relative: 10 %
Lymphs Abs: 1.1 10*3/uL (ref 0.7–4.0)
MCH: 29 pg (ref 26.0–34.0)
MCHC: 31.7 g/dL (ref 30.0–36.0)
MCV: 91.4 fL (ref 80.0–100.0)
Monocytes Absolute: 0.7 10*3/uL (ref 0.1–1.0)
Monocytes Relative: 6 %
Neutro Abs: 9 10*3/uL — ABNORMAL HIGH (ref 1.7–7.7)
Neutrophils Relative %: 83 %
Platelets: 276 10*3/uL (ref 150–400)
RBC: 4.28 MIL/uL (ref 3.87–5.11)
RDW: 15.1 % (ref 11.5–15.5)
WBC: 11 10*3/uL — ABNORMAL HIGH (ref 4.0–10.5)
nRBC: 0 % (ref 0.0–0.2)

## 2023-07-03 LAB — BASIC METABOLIC PANEL WITH GFR
Anion gap: 13 (ref 5–15)
BUN: 13 mg/dL (ref 8–23)
CO2: 21 mmol/L — ABNORMAL LOW (ref 22–32)
Calcium: 9 mg/dL (ref 8.9–10.3)
Chloride: 105 mmol/L (ref 98–111)
Creatinine, Ser: 0.62 mg/dL (ref 0.44–1.00)
GFR, Estimated: >60 mL/min (ref >60)
Glucose, Bld: 136 mg/dL — ABNORMAL HIGH (ref 70–99)
Potassium: 3.9 mmol/L (ref 3.5–5.1)
Sodium: 139 mmol/L (ref 135–145)

## 2023-07-03 LAB — TROPONIN I (HIGH SENSITIVITY)
Troponin I (High Sensitivity): 12 ng/L (ref <18)
Troponin I (High Sensitivity): 14 ng/L (ref <18)

## 2023-07-03 LAB — ECHOCARDIOGRAM COMPLETE
AR max vel: 1.73 cm2
AV Area VTI: 1.99 cm2
AV Area mean vel: 1.61 cm2
AV Mean grad: 23 mmHg
AV Peak grad: 22.7 mmHg
Ao pk vel: 2.38 m/s
Height: 61 in
S' Lateral: 3.2 cm
Weight: 3910.4 [oz_av]

## 2023-07-03 LAB — RESP PANEL BY RT-PCR (RSV, FLU A&B, COVID)  RVPGX2
Influenza A by PCR: NEGATIVE
Influenza B by PCR: NEGATIVE
Resp Syncytial Virus by PCR: NEGATIVE
SARS Coronavirus 2 by RT PCR: NEGATIVE

## 2023-07-03 LAB — TSH
TSH: 2.303 u[IU]/mL (ref 0.350–4.500)
TSH: 2.269 u[IU]/mL (ref 0.350–4.500)

## 2023-07-03 LAB — TROPONIN T, HIGH SENSITIVITY: Troponin T High Sensitivity: <15 ng/L (ref <19)

## 2023-07-03 LAB — CBG MONITORING, ED
Glucose-Capillary: 130 mg/dL — ABNORMAL HIGH (ref 70–99)
Glucose-Capillary: 115 mg/dL — ABNORMAL HIGH (ref 70–99)

## 2023-07-03 LAB — GLUCOSE, CAPILLARY
Glucose-Capillary: 132 mg/dL — ABNORMAL HIGH (ref 70–99)
Glucose-Capillary: 145 mg/dL — ABNORMAL HIGH (ref 70–99)

## 2023-07-03 LAB — MAGNESIUM: Magnesium: 2 mg/dL (ref 1.7–2.4)

## 2023-07-03 LAB — PRO BRAIN NATRIURETIC PEPTIDE: Pro Brain Natriuretic Peptide: 2556 pg/mL — ABNORMAL HIGH (ref <300.0)

## 2023-07-03 LAB — D-DIMER, QUANTITATIVE: D-Dimer, Quant: 0.4 ug{FEU}/mL (ref 0.00–0.50)

## 2023-07-03 MED ORDER — FUROSEMIDE 10 MG/ML IJ SOLN
20.0000 mg | Freq: Once | INTRAMUSCULAR | Status: AC
  Administered 2023-07-03: 20 mg via INTRAVENOUS
  Filled 2023-07-03: qty 2

## 2023-07-03 MED ORDER — OMEGA-3-ACID ETHYL ESTERS 1 G PO CAPS
2.0000 g | ORAL_CAPSULE | Freq: Two times a day (BID) | ORAL | Status: DC
  Administered 2023-07-03 – 2023-07-08 (×10): 2 g via ORAL
  Filled 2023-07-03 (×11): qty 2

## 2023-07-03 MED ORDER — PANTOPRAZOLE SODIUM 40 MG PO TBEC
40.0000 mg | DELAYED_RELEASE_TABLET | Freq: Two times a day (BID) | ORAL | Status: DC
Start: 2023-07-03 — End: 2023-07-08
  Administered 2023-07-03 – 2023-07-08 (×11): 40 mg via ORAL
  Filled 2023-07-03 (×11): qty 1

## 2023-07-03 MED ORDER — SIMVASTATIN 20 MG PO TABS
20.0000 mg | ORAL_TABLET | Freq: Every day | ORAL | Status: DC
  Administered 2023-07-03 – 2023-07-07 (×5): 20 mg via ORAL
  Filled 2023-07-03 (×5): qty 1

## 2023-07-03 MED ORDER — INSULIN ASPART 100 UNIT/ML IJ SOLN
0.0000 [IU] | Freq: Three times a day (TID) | INTRAMUSCULAR | Status: DC
  Administered 2023-07-03 – 2023-07-08 (×6): 1 [IU] via SUBCUTANEOUS

## 2023-07-03 MED ORDER — DESMOPRESSIN ACETATE 0.1 MG PO TABS
0.2000 mg | ORAL_TABLET | Freq: Every day | ORAL | Status: DC
  Administered 2023-07-03 – 2023-07-07 (×5): 0.2 mg via ORAL
  Filled 2023-07-03 (×5): qty 2

## 2023-07-03 MED ORDER — MIRABEGRON ER 50 MG PO TB24
50.0000 mg | ORAL_TABLET | Freq: Every day | ORAL | Status: DC
Start: 2023-07-03 — End: 2023-07-08
  Administered 2023-07-04 – 2023-07-08 (×5): 50 mg via ORAL
  Filled 2023-07-03 (×5): qty 1

## 2023-07-03 MED ORDER — ENOXAPARIN SODIUM 40 MG/0.4ML IJ SOSY
40.0000 mg | PREFILLED_SYRINGE | INTRAMUSCULAR | Status: DC
  Administered 2023-07-03 – 2023-07-05 (×2): 40 mg via SUBCUTANEOUS
  Filled 2023-07-03 (×3): qty 0.4

## 2023-07-03 MED ORDER — FUROSEMIDE 10 MG/ML IJ SOLN
40.0000 mg | Freq: Two times a day (BID) | INTRAMUSCULAR | Status: DC
  Administered 2023-07-03: 40 mg via INTRAVENOUS
  Filled 2023-07-03: qty 4

## 2023-07-03 MED ORDER — FESOTERODINE FUMARATE ER 4 MG PO TB24
4.0000 mg | ORAL_TABLET | Freq: Every day | ORAL | Status: DC
  Administered 2023-07-04 – 2023-07-08 (×5): 4 mg via ORAL
  Filled 2023-07-03 (×5): qty 1

## 2023-07-03 MED ORDER — SODIUM CHLORIDE 0.9 % IV SOLN
1.0000 g | Freq: Once | INTRAVENOUS | Status: AC
  Administered 2023-07-03: 1 g via INTRAVENOUS
  Filled 2023-07-03: qty 10

## 2023-07-03 MED ORDER — CLOPIDOGREL BISULFATE 75 MG PO TABS
75.0000 mg | ORAL_TABLET | Freq: Every day | ORAL | Status: DC
  Administered 2023-07-04 – 2023-07-05 (×2): 75 mg via ORAL
  Filled 2023-07-03 (×3): qty 1

## 2023-07-03 MED ORDER — LOSARTAN POTASSIUM 50 MG PO TABS
75.0000 mg | ORAL_TABLET | Freq: Every day | ORAL | Status: DC
  Administered 2023-07-03 – 2023-07-08 (×6): 75 mg via ORAL
  Filled 2023-07-03: qty 2
  Filled 2023-07-03 (×5): qty 1

## 2023-07-03 MED ORDER — ASPIRIN 81 MG PO TBEC
81.0000 mg | DELAYED_RELEASE_TABLET | Freq: Every day | ORAL | Status: DC
  Administered 2023-07-03 – 2023-07-08 (×5): 81 mg via ORAL
  Filled 2023-07-03 (×6): qty 1

## 2023-07-03 MED ORDER — SODIUM CHLORIDE 0.9 % IV SOLN
2.0000 g | INTRAVENOUS | Status: DC
  Administered 2023-07-04 – 2023-07-05 (×2): 2 g via INTRAVENOUS
  Filled 2023-07-03 (×2): qty 20

## 2023-07-03 NOTE — Evaluation (Signed)
 Occupational Therapy Evaluation Patient Details Name: Jasmin [name] MRN: [id number] DOB: 09-11-1945 Today's Date: 07/03/2023   History of Present Illness   78 y.o. female with history of diabetes mellitus type 2, hypertension, sleep apnea presented 5/11because of increasing dyspnea on exertion.  Patient also presenting with low HR.     Clinical Impressions Patient admitted for the diagnosis above.  PTA she lives at home with her spouse, and remains independent with ADL,iADL and mobility.  Patient continues to drive, and works outside in her garden as often as she can.  Currently she expresses no dyspnea, but continues to experience bradycardia.  Patient presents at her baseline with respect to mobility and ADL completion.  No OT needs are identified,and no post acute OT is recommended.  OT discontinued in the acute setting, encouraged continued mobility with staff.      If plan is discharge home, recommend the following:   Other (comment)     Functional Status Assessment   Patient has not had a recent decline in their functional status     Equipment Recommendations   None recommended by OT     Recommendations for Other Services         Precautions/Restrictions   Precautions Precautions: Other (comment) Precaution/Restrictions Comments: HR 40 to 57 throughout. Restrictions Weight Bearing Restrictions Per Provider Order: No     Mobility Bed Mobility                    Transfers Overall transfer level: Modified independent Equipment used: None               General transfer comment: Walking the halls ad lib.      Balance Overall balance assessment: No apparent balance deficits (not formally assessed)                                         ADL either performed or assessed with clinical judgement   ADL Overall ADL's : At baseline                                             Vision Patient Visual  Report: No change from baseline       Perception Perception: Not tested       Praxis Praxis: Not tested       Pertinent Vitals/Pain Pain Assessment Pain Assessment: No/denies pain     Extremity/Trunk Assessment Upper Extremity Assessment Upper Extremity Assessment: Overall WFL for tasks assessed   Lower Extremity Assessment Lower Extremity Assessment: Overall WFL for tasks assessed   Cervical / Trunk Assessment Cervical / Trunk Assessment: Normal   Communication Communication Communication: No apparent difficulties   Cognition Arousal: Alert Behavior During Therapy: WFL for tasks assessed/performed Cognition: No apparent impairments                               Following commands: Intact       Cueing  General Comments   Cueing Techniques: Verbal cues   Low HR   Exercises     Shoulder Instructions      Home Living Family/patient expects to be discharged to:: Private residence Living Arrangements: Spouse/significant other Available Help at Discharge: Family;Available 24 hours/day Type of  Home: House Home Access: Stairs to enter [employer] of Steps: 1 at the front of the home Entrance Stairs-Rails: None Home Layout: One level     Bathroom Shower/Tub: Tub/shower unit;Walk-in shower   Bathroom Toilet: Standard Bathroom Accessibility: Yes How Accessible: Accessible via walker Home Equipment: Rolling Walker (2 wheels);BSC/3in1;Shower seat;Adaptive equipment Adaptive Equipment: Reacher Additional Comments: DME from [facility] and [facility]      Prior Functioning/Environment Prior Level of Function : Independent/Modified Independent;Driving             Mobility Comments: Does not use DME ADLs Comments: Ind with ADL and iADL, remains active.    OT Problem List: Decreased activity tolerance   OT Treatment/Interventions:        OT Goals(Current goals can be found in the care plan section)   Acute Rehab OT Goals Patient  Stated Goal: Hoping to return home OT Goal Formulation: With patient Time For Goal Achievement: 07/07/23 Potential to Achieve Goals: Good   OT Frequency:       Co-evaluation              AM-PAC OT "6 Clicks" Daily Activity     Outcome Measure Help from another person eating meals?: None Help from another person taking care of personal grooming?: None Help from another person toileting, which includes using toliet, bedpan, or urinal?: None Help from another person bathing (including washing, rinsing, drying)?: None Help from another person to put on and taking off regular upper body clothing?: None Help from another person to put on and taking off regular lower body clothing?: None 6 Click Score: 24   End of Session Equipment Utilized During Treatment: Gait belt Nurse Communication: Mobility status  Activity Tolerance: Patient tolerated treatment well Patient left: in chair;with call bell/phone within reach;with family/visitor present  OT Visit Diagnosis: Muscle weakness (generalized) (M62.81)                Time: 0454-0981 OT Time Calculation (min): 19 min Charges:  OT General Charges $OT Visit: 1 Visit OT Evaluation $OT Eval Moderate Complexity: 1 Mod  07/03/2023  RP, OTR/L  Acute Rehabilitation Services  Office:  [phone]   Benjamen Brand 07/03/2023, 11:09 AM

## 2023-07-03 NOTE — Progress Notes (Signed)
 PT Cancellation Note  Patient Details Name: Jasmin [name] MRN: [id number] DOB: April 01, 1945   Cancelled Treatment:    Reason Eval/Treat Not Completed: Patient at procedure or test/unavailable. Echo present upon PT arrival. PT will follow up as time allows.   Rexie Catena 07/03/2023, 2:10 PM

## 2023-07-03 NOTE — ED Notes (Signed)
 Report called to Adriana Hopping, Geophysicist/field seismologist at [facility]

## 2023-07-03 NOTE — ED Notes (Signed)
Phlebotomy to collect troponin

## 2023-07-03 NOTE — Progress Notes (Incomplete)
 PROGRESS NOTE    Jasmin [name]  XBJ:[id number] DOB: [birth year] DOA: 07/02/2023 PCP: [name], MD (Confirm with patient/family/NH records and if not entered, this HAS to be entered at [facility] point of entry. "No PCP" if truly none.)   Chief Complaint  Patient presents with   Shortness of Breath    Brief Narrative: (Start on day 1 of progress note - keep it brief and live) ***   Assessment & Plan:   Principal Problem:   Bradycardia Active Problems:   Mixed hyperlipidemia   Essential hypertension   OSA on CPAP   Diabetes mellitus without complication (HCC)   UTI (urinary tract infection)   Symptomatic bradycardia   AV block, 2nd degree   CAD in native artery   Acute on chronic heart failure (HCC)   Morbid obesity (HCC)   ***   DVT prophylaxis: (Lovenox/Heparin/SCD's/anticoagulated/None (if comfort care) Code Status: (Full/Partial - specify details) Family Communication: (Specify name, relationship & date discussed. NO "discussed with patient") Disposition:   Status is: Observation {Observation:23811}   Consultants:  ***  Procedures: (Don't include imaging studies which can be auto populated. Include things that cannot be auto populated i.e. Echo, Carotid and venous dopplers, Foley, Bipap, HD, tubes/drains, wound vac, central lines etc) ***  Antimicrobials: (specify start and planned stop date. Auto populated tables are space occupying and do not give end dates) ***    Subjective: Denies any ongoing CP. Still w with some SOB .  Objective: Vitals:   07/03/23 0700 07/03/23 0900 07/03/23 0930 07/03/23 1000  BP: (!) 107/90 132/76 91/74 (!) 134/54  Pulse: (!) 43 (!) 48 (!) 45 (!) 48  Resp: (!) 22 19 15  (!) 25  Temp: 97.7 F (36.5 C)     TempSrc:      SpO2: 95% 100% 97% 100%  Weight:      Height:       No intake or output data in the 24 hours ending 07/03/23 1034 Filed Weights   07/02/23 1809  Weight: 110.9 kg    Examination:  General exam:  Appears calm and comfortable  Respiratory system: Clear to auscultation. Respiratory effort normal. Cardiovascular system: S1 & S2 heard, RRR. No JVD, murmurs, rubs, gallops or clicks. No pedal edema. Gastrointestinal system: Abdomen is nondistended, soft and nontender. No organomegaly or masses felt. Normal bowel sounds heard. Central nervous system: Alert and oriented. No focal neurological deficits. Extremities: Symmetric 5 x 5 power. Skin: No rashes, lesions or ulcers Psychiatry: Judgement and insight appear normal. Mood & affect appropriate.     Data Reviewed: I have personally reviewed following labs and imaging studies  CBC: Recent Labs  Lab 07/02/23 1812 07/03/23 0748  WBC 11.3* 11.0*  NEUTROABS 7.6 9.0*  HGB 12.3 12.4  HCT 37.4 39.1  MCV 87.2 91.4  PLT 305 276    Basic Metabolic Panel: Recent Labs  Lab 07/02/23 1812 07/03/23 0157 07/03/23 0748  NA 137  --  139  K 4.0  --  3.9  CL 100  --  105  CO2 23  --  21*  GLUCOSE 174*  --  136*  BUN 18  --  13  CREATININE 0.75  --  0.62  CALCIUM  9.4  --  9.0  MG  --  2.0  --     GFR: Estimated Creatinine Clearance: 66.8 mL/min (by C-G formula based on SCr of 0.62 mg/dL).  Liver Function Tests: Recent Labs  Lab 07/02/23 1812  AST 24  ALT 21  ALKPHOS 63  BILITOT 0.5  PROT 6.7  ALBUMIN 4.1    CBG: Recent Labs  Lab 07/03/23 0737  GLUCAP 130*     Recent Results (from the past 240 hours)  Resp panel by RT-PCR (RSV, Flu A&B, Covid) Anterior Nasal Swab     Status: None   Collection Time: 07/03/23 12:27 AM   Specimen: Anterior Nasal Swab  Result Value Ref Range Status   SARS Coronavirus 2 by RT PCR NEGATIVE NEGATIVE Final    Comment: (NOTE) SARS-CoV-2 target nucleic acids are NOT DETECTED.  The SARS-CoV-2 RNA is generally detectable in upper respiratory specimens during the acute phase of infection. The lowest concentration of SARS-CoV-2 viral copies this assay can detect is 138 copies/mL. A negative  result does not preclude SARS-Cov-2 infection and should not be used as the sole basis for treatment or other patient management decisions. A negative result may occur with  improper specimen collection/handling, submission of specimen other than nasopharyngeal swab, presence of viral mutation(s) within the areas targeted by this assay, and inadequate number of viral copies(<138 copies/mL). A negative result must be combined with clinical observations, patient history, and epidemiological information. The expected result is Negative.  Fact Sheet for Patients:  BloggerCourse.com  Fact Sheet for Healthcare Providers:  SeriousBroker.it  This test is no t yet approved or cleared by the [place]  FDA and  has been authorized for detection and/or diagnosis of SARS-CoV-2 by FDA under an Emergency Use Authorization (EUA). This EUA will remain  in effect (meaning this test can be used) for the duration of the COVID-19 declaration under Section 564(b)(1) of the Act, 21 U.S.C.section 360bbb-3(b)(1), unless the authorization is terminated  or revoked sooner.       Influenza A by PCR NEGATIVE NEGATIVE Final   Influenza B by PCR NEGATIVE NEGATIVE Final    Comment: (NOTE) The Xpert Xpress SARS-CoV-2/FLU/RSV plus assay is intended as an aid in the diagnosis of influenza from Nasopharyngeal swab specimens and should not be used as a sole basis for treatment. Nasal washings and aspirates are unacceptable for Xpert Xpress SARS-CoV-2/FLU/RSV testing.  Fact Sheet for Patients: BloggerCourse.com  Fact Sheet for Healthcare Providers: SeriousBroker.it  This test is not yet approved or cleared by the [place]  FDA and has been authorized for detection and/or diagnosis of SARS-CoV-2 by FDA under an Emergency Use Authorization (EUA). This EUA will remain in effect (meaning this test can be used)  for the duration of the COVID-19 declaration under Section 564(b)(1) of the Act, 21 U.S.C. section 360bbb-3(b)(1), unless the authorization is terminated or revoked.     Resp Syncytial Virus by PCR NEGATIVE NEGATIVE Final    Comment: (NOTE) Fact Sheet for Patients: BloggerCourse.com  Fact Sheet for Healthcare Providers: SeriousBroker.it  This test is not yet approved or cleared by the [place]  FDA and has been authorized for detection and/or diagnosis of SARS-CoV-2 by FDA under an Emergency Use Authorization (EUA). This EUA will remain in effect (meaning this test can be used) for the duration of the COVID-19 declaration under Section 564(b)(1) of the Act, 21 U.S.C. section 360bbb-3(b)(1), unless the authorization is terminated or revoked.  Performed at [facility], [address] [place], [place] [address]          Radiology Studies: DG Chest 2 View Result Date: 07/02/2023 CLINICAL DATA:  Chest pain, SOB. EXAM: CHEST - 2 VIEW COMPARISON:  01/17/2022 FINDINGS: The heart size and mediastinal contours are within normal limits. No pneumothorax  or pleural effusion. Lower lung interstitial prominence consistent with scarring or subsegmental atelectasis. Calcified aorta. Thoracic degenerative changes. IMPRESSION: Bibasilar subsegmental atelectasis or scarring. Electronically Signed   By: Sydell Eva M.D.   On: 07/02/2023 20:26        Scheduled Meds:  aspirin  EC  81 mg Oral Daily   clopidogrel   75 mg Oral Daily   desmopressin   0.2 mg Oral QHS   enoxaparin (LOVENOX) injection  40 mg Subcutaneous Q24H   fesoterodine  4 mg Oral Daily   furosemide  40 mg Intravenous Q12H   insulin  aspart  0-9 Units Subcutaneous TID WC   losartan   75 mg Oral Daily   mirabegron  ER  50 mg Oral Daily   omega-3 acid ethyl esters  2 g Oral BID   pantoprazole   40 mg Oral BID   simvastatin   20 mg Oral QHS   Continuous  Infusions:  [START ON 07/04/2023] cefTRIAXone (ROCEPHIN)  IV       LOS: 0 days    Time spent: ***    Hilda Lovings, MD [facility]   To contact the attending provider between 7A-7P or the covering provider during after hours 7P-7A, please log into the web site [email or web address] and access using universal [place] password for that web site. If you do not have the password, please call the hospital operator.  07/03/2023, 10:34 AM

## 2023-07-03 NOTE — Progress Notes (Signed)
 I have seen and assessed patient and agree with Dr. Kent Pear assessment and plan. Patient 78 year old female history of type 2 diabetes, hypertension, sleep apnea presented to the ED with increasing dyspnea on exertion which she noticed after increased dose of Ozempic .  Patient with dyspnea on minimal exertion.  Patient seen in the ED noted to be bradycardic with a 2-1 AV block with prolonged PR up to 450 MS.  It was noted that while ambulating in the ER patient's heart rate did go up into the 70s.  Patient admitted for further evaluation.  Cardiology consulted. Physical exam General: NAD Respiratory: Lungs clear to auscultation bilaterally.  No wheezes, no crackles, no rhonchi.  Fair air movement.  Speaking in full sentences. Cardiovascular: Bradycardic.  No murmurs rubs or gallops.  No JVD.  No pitting lower extremity edema GI: Abdomen is soft, nontender, nondistended, positive bowel sounds.  No rebound.  No guarding. Extremities: No clubbing cyanosis or edema  Assessment/plan 1.  2:1 AV block -Patient presented with worsening dyspnea on exertion, noted to be bradycardic with 2:1 AV block with prolonged PR up to 450 ms. - High-sensitivity cardiac enzymes cycled which are negative. -TSH noted at 2.269. -BNP noted to be elevated at 2556 however patient does not look volume overloaded on examination. -Patient with Lasix 20 mg IV on presentation to the ED and received Lasix 40 mg IV x 1 this morning. -Continue aspirin , losartan , simvastatin . -Patient not on rate limiting medications. -2D echo pending. -Cardiology consulted and patient for LHC for further evaluation.  2.  Hyperlipidemia -Fasting lipid panel with LDL of 64. -Continue simvastatin .  3.  Hypertension -Continue losartan .  4.  History of prior CVA -Continue aspirin  and Plavix  for secondary stroke prophylaxis. -Continue statin.  5.  OSA -CPAP nightly  6.  Well-controlled diabetes mellitus type 2 -Hemoglobin A1c 6.3  (04/27/2023) - Patient noted to be on metformin  prior to admission. -SSI.  7.  UTI -Urinalysis concerning for UTI. - Urine cultures pending. - Place on IV Rocephin.  No charge

## 2023-07-03 NOTE — ED Notes (Signed)
 Pt BIB [facility] from [facility]. [facility] reports pt is experiencing Symptomatic bradycardia, elevated BNP. 150/70, HR 46, irregular rhythm, lowest HR 40's.

## 2023-07-03 NOTE — H&P (Signed)
 History and Physical    Jasmin [name] ZOX:[id number] DOB: 1945-05-11 DOA: 07/02/2023  Patient coming from: Home.  Chief Complaint: Shortness of breath.  HPI: Jasmin [name] is a 78 y.o. female with history of diabetes mellitus type 2, hypertension, sleep apnea presented to the ER because of increasing dyspnea on exertion.  Patient states her Ozempic  dose was recently increased 2 weeks ago and the last dose was around 4 days ago.  Since then she has been getting more short of breath with minimal exertion.  Denies any chest pain productive cough fever or chills.  When the symptoms patient presents to the ER.  ED Course: In the ER patient is found to be having 2: 1 AV block and prolonged PR up to 450 ms.  Patient was transferred to [facility] from the [facility] ER.  While ambulating in the ER patient's heart rate did go up to 70s.  Admitted for further observation.  Cardiology was consulted.  Patient is not on any rate limiting medications.  Review of Systems: As per HPI, rest all negative.   Past Medical History:  Diagnosis Date   Acid reflux disease 03/09/2015   Aortic stenosis 03/09/2015   AR (allergic rhinitis) 03/09/2015   Arthritis    Back pain    Controlled type 2 diabetes mellitus without complication, without long-term current use of insulin  (HCC) 08/08/2016   Cough    Cystocele with rectocele 08/21/2016   Decreased hearing    Diabetes mellitus without complication (HCC)    Dry mouth    Easy bruising    Essential hypertension 08/08/2016   Heart murmur    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   History of CVA (cerebrovascular accident) 08/08/2016   Seen on MRI from 08/2015   Hyperlipidemia    Hypertension    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   IBS (irritable bowel syndrome) 07/16/2015   Insomnia 07/16/2015   Joint pain    Knee pain    Left ventricular hypertrophy 07/16/2015   Leg cramping    right leg   Mixed hyperlipidemia 08/08/2016   Morbid  obesity (HCC) 11/10/2015   Nasal discharge    Nuclear sclerotic cataract of left eye 07/07/2016   OAB (overactive bladder) 01/20/2016   OSA (obstructive sleep apnea) 03/09/2015   Osteoporosis 03/09/2015   Primary osteoarthritis of left knee 03/31/2015   Pulmonary hypertension (HCC) 07/16/2015   Red eyes    Right kidney stone    Shortness of breath on exertion    Skin cancer 11/04/2020   Sleep apnea    Stroke (HCC)    Swelling of both lower extremities    UTI (urinary tract infection)    Vitamin D  deficiency 03/09/2015    Past Surgical History:  Procedure Laterality Date   ABDOMINAL HYSTERECTOMY  09/14/2016   CATARACT EXTRACTION Left    CATARACT EXTRACTION Left 06/2016   COLPORRHAPHY N/A 09/14/2016   [facility]   CYSTOCELE REPAIR     FRACTURE SURGERY Left 1953   L arm   FRACTURE SURGERY Left 1985   L ankle   HERNIA REPAIR  2004   JOINT REPLACEMENT Right 2012   R TKA   JOINT REPLACEMENT Left 2017   KIDNEY STONE SURGERY     LAPAROSCOPIC ASSISTED VAGINAL HYSTERECTOMY N/A 09/14/2016   [facility]   R foot/ankle Right 2013 and 2014   plate and screws lateral foot and tendon removal medial foot in 2013, revision in 2014   RECTOCELE REPAIR  URETERAL STENT PLACEMENT     URETEROSCOPY       reports that she has never smoked. She has never been exposed to tobacco smoke. She has never used smokeless tobacco. She reports current alcohol use of about 2.0 - 3.0 standard drinks of alcohol per week. She reports that she does not use drugs.  Allergies  Allergen Reactions   Latex Itching   Ciprofloxacin Hives   Metronidazole Hives   Tape Rash and Itching    Redness of skin    Family History  Problem Relation Age of Onset   Hyperlipidemia Mother    Heart disease Mother    Hypertension Mother    Stroke Mother    Thyroid  disease Mother    Hypertension Father    Heart disease Father     Prior to Admission medications   Medication Sig Start Date End Date Taking? Authorizing Provider   Acetaminophen  (TYLENOL  ARTHRITIS PAIN PO) Take 650 mg by mouth every 8 (eight) hours as needed (PAIN).   Yes [provider]  amoxicillin  (AMOXIL ) 500 MG tablet Take 500 mg by mouth 2 (two) times daily. 1 hr prior to dentist visit   Yes [provider]  Ascorbic Acid  (VITAMIN C  WITH ROSE HIPS) 500 MG tablet Take 500 mg by mouth daily.   Yes [provider]  aspirin  EC 81 MG tablet Take 1 tablet (81 mg total) by mouth daily. Swallow whole. 06/02/22  Yes Copland, Skipper Dumas, MD  CALCIUM  PO Take 1 tablet by mouth daily.   Yes [provider]  Cholecalciferol (VITAMIN D3) 2000 units TABS Take 1 tablet by mouth in the morning and at bedtime.   Yes [provider]  clopidogrel  (PLAVIX ) 75 MG tablet Take 1 tablet (75 mg total) by mouth daily. 02/13/23  Yes Copland, Skipper Dumas, MD  clotrimazole -betamethasone  (LOTRISONE ) cream Apply to plantar foot daily 02/21/23  Yes   CRANBERRY EXTRACT PO Take 25,000 mg by mouth 2 (two) times daily.   Yes [provider]  cycloSPORINE  (RESTASIS ) 0.05 % ophthalmic emulsion Place 1 drop into both eyes 2 (two) times daily. 04/14/23  Yes   desmopressin  (DDAVP ) 0.2 MG tablet Take 1 tablet (0.2 mg total) by mouth at bedtime. 10/13/22  Yes   desonide  (DESOWEN ) 0.05 % cream Apply as directed to affected area. 06/06/23  Yes   econazole nitrate 1 % cream Apply 1 Application topically as needed (skin irritation).   Yes [provider]  estradiol  (ESTRACE ) 0.1 MG/GM vaginal cream Apply 0.5 grams vaginally nightly for 2 weeks, then apply 0.5 grams 2 times weekly 02/06/23  Yes   fluticasone  (FLONASE ) 50 MCG/ACT nasal spray Place 2 sprays into both nostrils daily. 04/24/23  Yes Copland, Skipper Dumas, MD  losartan  (COZAAR ) 50 MG tablet Take 1 & 1/2 tablets (75 mg total) by mouth daily. 06/21/23 06/20/24 Yes Copland, Skipper Dumas, MD  metFORMIN  (GLUCOPHAGE ) 500 MG tablet Take 1 tablet (500 mg total) by mouth daily with breakfast. 06/21/23  Yes  Copland, Skipper Dumas, MD  mirabegron  ER (MYRBETRIQ ) 50 MG TB24 tablet Take 1 tablet (50 mg total) by mouth daily. 05/23/23  Yes Copland, Skipper Dumas, MD  omega-3 acid ethyl esters (LOVAZA ) 1 g capsule Take 2 capsules (2 g total) by mouth 2 (two) times daily. 05/16/23  Yes Copland, Skipper Dumas, MD  OXYCODONE  HCL PO Take 1 tablet by mouth as needed (pain).   Yes [provider]  pantoprazole  (PROTONIX ) 40 MG tablet Take 1 tablet (40 mg  total) by mouth 2 (two) times daily. 09/01/22  Yes   Polyethyl Glycol-Propyl Glycol (SYSTANE OP) Apply 1 drop to eye daily as needed (for dry eyes).   Yes [provider]  PSYLLIUM PO Take 1 tablet by mouth daily.   Yes [provider]  simvastatin  (ZOCOR ) 20 MG tablet Take 1 tablet (20 mg total) by mouth at bedtime. 03/27/23  Yes Copland, Skipper Dumas, MD  solifenacin  (VESICARE ) 10 MG tablet Take 1 tablet (10 mg total) by mouth daily. Swallow whole do not crush, chew, or split. 06/02/23  Yes   Accu-Chek Softclix Lancets lancets USE AS DIRECTED TO CHECK BLOOD GLUCOSE ONCE DAILY 04/06/23   Copland, Jessica C, MD  blood glucose meter kit and supplies KIT Dispense based on patient and insurance preference. Use once daily to monitor glucose as needed (FOR ICD-9 250.00, 250.01). Patient taking differently: Inject 1 each into the skin as directed. Dispense based on patient and insurance preference. Use once daily to monitor glucose as needed (FOR ICD-9 250.00, 250.01). 12/04/19   Copland, Skipper Dumas, MD  cycloSPORINE  (RESTASIS ) 0.05 % ophthalmic emulsion Place 1 drop into both eyes 2 (two) times daily. Patient not taking: Reported on 07/03/2023 10/19/22     glucose blood test strip Use as directed to check blood glucose daily. 01/10/23 01/09/24  Copland, Skipper Dumas, MD    Physical Exam: Constitutional: Moderately built and nourished. Vitals:   07/03/23 0415 07/03/23 0430 07/03/23 0540 07/03/23 0545  BP:  (!) 159/44 (!) 150/45 (!) 137/46  Pulse:  (!) 46 (!) 44 (!)  43  Resp:  14 16 (!) 22  Temp: 98.2 F (36.8 C)     TempSrc: Oral     SpO2:  98% 96% 95%  Weight:      Height:       Eyes: Anicteric no pallor. ENMT: No discharge from the ears/nose or mouth. Neck: No mass felt.  No neck rigidity. Respiratory: No rhonchi or crepitations. Cardiovascular: S1-S2 heard. Abdomen: Soft nontender bowel sound present. Musculoskeletal: No edema. Skin: No rash. Neurologic: Alert awake oriented time place and person.  Moves all extremities. Psychiatric: Appears normal.  Normal affect.   Labs on Admission: I have personally reviewed following labs and imaging studies  CBC: Recent Labs  Lab 07/02/23 1812  WBC 11.3*  NEUTROABS 7.6  HGB 12.3  HCT 37.4  MCV 87.2  PLT 305   Basic Metabolic Panel: Recent Labs  Lab 07/02/23 1812 07/03/23 0157  NA 137  --   K 4.0  --   CL 100  --   CO2 23  --   GLUCOSE 174*  --   BUN 18  --   CREATININE 0.75  --   CALCIUM  9.4  --   MG  --  2.0   GFR: Estimated Creatinine Clearance: 66.8 mL/min (by C-G formula based on SCr of 0.75 mg/dL). Liver Function Tests: Recent Labs  Lab 07/02/23 1812  AST 24  ALT 21  ALKPHOS 63  BILITOT 0.5  PROT 6.7  ALBUMIN 4.1   Recent Labs  Lab 07/02/23 1812  LIPASE 33   No results for input(s): "AMMONIA" in the last 168 hours. Coagulation Profile: No results for input(s): "INR", "PROTIME" in the last 168 hours. Cardiac Enzymes: No results for input(s): "CKTOTAL", "CKMB", "CKMBINDEX", "TROPONINI" in the last 168 hours. BNP (last 3 results) Recent Labs    07/03/23 0045  PROBNP 2,556.0*   HbA1C: No results for input(s): "HGBA1C" in the last 72 hours.  CBG: No results for input(s): "GLUCAP" in the last 168 hours. Lipid Profile: No results for input(s): "CHOL", "HDL", "LDLCALC", "TRIG", "CHOLHDL", "LDLDIRECT" in the last 72 hours. Thyroid  Function Tests: Recent Labs    07/03/23 0537  TSH 2.303   Anemia Panel: No results for input(s): "VITAMINB12", "FOLATE",  "FERRITIN", "TIBC", "IRON ", "RETICCTPCT" in the last 72 hours. Urine analysis:    Component Value Date/Time   COLORURINE YELLOW 07/02/2023 1813   APPEARANCEUR CLEAR 07/02/2023 1813   LABSPEC 1.025 07/02/2023 1813   PHURINE 5.5 07/02/2023 1813   GLUCOSEU NEGATIVE 07/02/2023 1813   HGBUR NEGATIVE 07/02/2023 1813   BILIRUBINUR NEGATIVE 07/02/2023 1813   BILIRUBINUR negative 03/20/2023 0958   BILIRUBINUR Negative 02/23/2021 1218   KETONESUR NEGATIVE 07/02/2023 1813   PROTEINUR 100 (A) 07/02/2023 1813   UROBILINOGEN 0.2 03/20/2023 0958   NITRITE POSITIVE (A) 07/02/2023 1813   LEUKOCYTESUR NEGATIVE 07/02/2023 1813   Sepsis Labs: @LABRCNTIP (procalcitonin:4,lacticidven:4) ) Recent Results (from the past 240 hours)  Resp panel by RT-PCR (RSV, Flu A&B, Covid) Anterior Nasal Swab     Status: None   Collection Time: 07/03/23 12:27 AM   Specimen: Anterior Nasal Swab  Result Value Ref Range Status   SARS Coronavirus 2 by RT PCR NEGATIVE NEGATIVE Final    Comment: (NOTE) SARS-CoV-2 target nucleic acids are NOT DETECTED.  The SARS-CoV-2 RNA is generally detectable in upper respiratory specimens during the acute phase of infection. The lowest concentration of SARS-CoV-2 viral copies this assay can detect is 138 copies/mL. A negative result does not preclude SARS-Cov-2 infection and should not be used as the sole basis for treatment or other patient management decisions. A negative result may occur with  improper specimen collection/handling, submission of specimen other than nasopharyngeal swab, presence of viral mutation(s) within the areas targeted by this assay, and inadequate number of viral copies(<138 copies/mL). A negative result must be combined with clinical observations, patient history, and epidemiological information. The expected result is Negative.  Fact Sheet for Patients:  BloggerCourse.com  Fact Sheet for Healthcare Providers:   SeriousBroker.it  This test is no t yet approved or cleared by the [place]  FDA and  has been authorized for detection and/or diagnosis of SARS-CoV-2 by FDA under an Emergency Use Authorization (EUA). This EUA will remain  in effect (meaning this test can be used) for the duration of the COVID-19 declaration under Section 564(b)(1) of the Act, 21 U.S.C.section 360bbb-3(b)(1), unless the authorization is terminated  or revoked sooner.       Influenza A by PCR NEGATIVE NEGATIVE Final   Influenza B by PCR NEGATIVE NEGATIVE Final    Comment: (NOTE) The Xpert Xpress SARS-CoV-2/FLU/RSV plus assay is intended as an aid in the diagnosis of influenza from Nasopharyngeal swab specimens and should not be used as a sole basis for treatment. Nasal washings and aspirates are unacceptable for Xpert Xpress SARS-CoV-2/FLU/RSV testing.  Fact Sheet for Patients: BloggerCourse.com  Fact Sheet for Healthcare Providers: SeriousBroker.it  This test is not yet approved or cleared by the [place]  FDA and has been authorized for detection and/or diagnosis of SARS-CoV-2 by FDA under an Emergency Use Authorization (EUA). This EUA will remain in effect (meaning this test can be used) for the duration of the COVID-19 declaration under Section 564(b)(1) of the Act, 21 U.S.C. section 360bbb-3(b)(1), unless the authorization is terminated or revoked.     Resp Syncytial Virus by PCR NEGATIVE NEGATIVE Final    Comment: (NOTE) Fact Sheet for Patients: BloggerCourse.com  Fact Sheet  for Healthcare Providers: SeriousBroker.it  This test is not yet approved or cleared by the [place]  FDA and has been authorized for detection and/or diagnosis of SARS-CoV-2 by FDA under an Emergency Use Authorization (EUA). This EUA will remain in effect (meaning this test can be used) for  the duration of the COVID-19 declaration under Section 564(b)(1) of the Act, 21 U.S.C. section 360bbb-3(b)(1), unless the authorization is terminated or revoked.  Performed at [facility], [address] [place], [place] [address]      Radiological Exams on Admission: DG Chest 2 View Result Date: 07/02/2023 CLINICAL DATA:  Chest pain, SOB. EXAM: CHEST - 2 VIEW COMPARISON:  01/17/2022 FINDINGS: The heart size and mediastinal contours are within normal limits. No pneumothorax or pleural effusion. Lower lung interstitial prominence consistent with scarring or subsegmental atelectasis. Calcified aorta. Thoracic degenerative changes. IMPRESSION: Bibasilar subsegmental atelectasis or scarring. Electronically Signed   By: Sydell Eva M.D.   On: 07/02/2023 20:26    EKG: Independently reviewed.   2:1 AV block.  Assessment/Plan Principal Problem:   Bradycardia Active Problems:   Mixed hyperlipidemia   Essential hypertension   OSA (obstructive sleep apnea)   Diabetes mellitus without complication (HCC)   UTI (urinary tract infection)   Symptomatic bradycardia    2:1 AV block -   appreciate cardiology consult.  Cardiology planning to do 2D echo ischemic workup.  Will check TSH.  Continue with aspirin . Hypertension on losartan . Diabetes mellitus type 2 presently on sliding scale coverage last hemoglobin A1c was 6.3. Hyperlipidemia on statins. Sleep apnea on CPAP at bedtime.  Since patient has symptomatic bradycardia will need close monitoring further workup and more than 2 midnight stay.   DVT prophylaxis: Lovenox. Code Status: Full code. Family Communication: Discussed with husband at the bedside. Disposition Plan: Progressive care. Consults called: Cardiology. Admission status: Observation.

## 2023-07-03 NOTE — Progress Notes (Signed)
 Pt. Has her own cpap from home. No issues.

## 2023-07-03 NOTE — ED Notes (Signed)
 Phlebotomy to collect AM labs.  This RN tried to pull blood from IV with no success.  MD Hildy Lowers aware.

## 2023-07-03 NOTE — Consult Note (Addendum)
 Cardiology Consultation   Patient ID: Mayar Arguello MRN: [id number]; DOB: [birth year]  Admit date: 07/02/2023 Date of Consult: 07/03/2023  PCP:  [name], MD   [facility] Providers Cardiologist:  None   {  History of Present Illness:   Bradi Erkkila is a 78 y.o. female with a hx of T2DM, HTN, HLD, Hx CVA, OSA, GERD, ?AS, who is being seen 07/03/2023 for the evaluation of 2:1 heart block at the request of hospital medicine.  Patient reports starting ozempic  2 weeks ago and most recently took her second dose this past Thursday. Since that time, she reports feeling increased dyspnea on exertion in performing daily activities and on walks up to 35ft that she was normally able to perform. She reports her DOE would improve after taking a break and continuing. She noted that on Friday her symptoms continued to progress where she felt short of breath even with performing her ADLs. Patient feels like the ozempic  has been making her fatigued and is what is causing her symptoms.   At the [facility] ER, patient was found to be bradycardic to the 40s with EKG demonstratating 2:1 AV block with prolonged PR up to and was recommended for transfer to [place] ER. At [facility] patient reports she feels asymptomatic while in bed. She had been ambulated with HR up to 70 though tele strip is not available.   Patient's prior cardiac history is notable for prior Coronary calcium  score on 02/17/22 of 1245 (left main: 423, LAD: 174, Lcx 158, RCA 489; total percentile 95%); she was also found to have severe mitral annular calcifications. Nuclear stress test on 03/03/22 demonstrated normal LVEF 71% with normal myocardial perfusion  Past Medical History:  Diagnosis Date   Acid reflux disease 03/09/2015   Aortic stenosis 03/09/2015   AR (allergic rhinitis) 03/09/2015   Arthritis    Back pain    Controlled type 2 diabetes mellitus without complication, without long-term current use of  insulin  (HCC) 08/08/2016   Cough    Cystocele with rectocele 08/21/2016   Decreased hearing    Diabetes mellitus without complication (HCC)    Dry mouth    Easy bruising    Essential hypertension 08/08/2016   Heart murmur    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   History of CVA (cerebrovascular accident) 08/08/2016   Seen on MRI from 08/2015   Hyperlipidemia    Hypertension    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   IBS (irritable bowel syndrome) 07/16/2015   Insomnia 07/16/2015   Joint pain    Knee pain    Left ventricular hypertrophy 07/16/2015   Leg cramping    right leg   Mixed hyperlipidemia 08/08/2016   Morbid obesity (HCC) 11/10/2015   Nasal discharge    Nuclear sclerotic cataract of left eye 07/07/2016   OAB (overactive bladder) 01/20/2016   OSA (obstructive sleep apnea) 03/09/2015   Osteoporosis 03/09/2015   Primary osteoarthritis of left knee 03/31/2015   Pulmonary hypertension (HCC) 07/16/2015   Red eyes    Right kidney stone    Shortness of breath on exertion    Skin cancer 11/04/2020   Sleep apnea    Stroke (HCC)    Swelling of both lower extremities    UTI (urinary tract infection)    Vitamin D  deficiency 03/09/2015    Past Surgical History:  Procedure Laterality Date   ABDOMINAL HYSTERECTOMY  09/14/2016   CATARACT EXTRACTION Left    CATARACT EXTRACTION Left 06/2016  COLPORRHAPHY N/A 09/14/2016   [facility]   CYSTOCELE REPAIR     FRACTURE SURGERY Left 1953   L arm   FRACTURE SURGERY Left 1985   L ankle   HERNIA REPAIR  2004   JOINT REPLACEMENT Right 2012   R TKA   JOINT REPLACEMENT Left 2017   KIDNEY STONE SURGERY     LAPAROSCOPIC ASSISTED VAGINAL HYSTERECTOMY N/A 09/14/2016   [facility]   R foot/ankle Right 2013 and 2014   plate and screws lateral foot and tendon removal medial foot in 2013, revision in 2014   RECTOCELE REPAIR     URETERAL STENT PLACEMENT     URETEROSCOPY       Home Medications:  Prior to Admission medications   Medication Sig Start Date  End Date Taking? Authorizing Provider  Acetaminophen  (TYLENOL  ARTHRITIS PAIN PO) Take 650 mg by mouth every 8 (eight) hours as needed (PAIN).   Yes [provider]  amoxicillin  (AMOXIL ) 500 MG tablet Take 500 mg by mouth 2 (two) times daily. 1 hr prior to dentist visit   Yes [provider]  Ascorbic Acid  (VITAMIN C  WITH ROSE HIPS) 500 MG tablet Take 500 mg by mouth daily.   Yes [provider]  aspirin  EC 81 MG tablet Take 1 tablet (81 mg total) by mouth daily. Swallow whole. 06/02/22  Yes Copland, Skipper Dumas, MD  CALCIUM  PO Take 1 tablet by mouth daily.   Yes [provider]  Cholecalciferol (VITAMIN D3) 2000 units TABS Take 1 tablet by mouth in the morning and at bedtime.   Yes [provider]  clopidogrel  (PLAVIX ) 75 MG tablet Take 1 tablet (75 mg total) by mouth daily. 02/13/23  Yes Copland, Skipper Dumas, MD  clotrimazole -betamethasone  (LOTRISONE ) cream Apply to plantar foot daily 02/21/23  Yes   CRANBERRY EXTRACT PO Take 25,000 mg by mouth 2 (two) times daily.   Yes [provider]  cycloSPORINE  (RESTASIS ) 0.05 % ophthalmic emulsion Place 1 drop into both eyes 2 (two) times daily. 04/14/23  Yes   desmopressin  (DDAVP ) 0.2 MG tablet Take 1 tablet (0.2 mg total) by mouth at bedtime. 10/13/22  Yes   desonide  (DESOWEN ) 0.05 % cream Apply as directed to affected area. 06/06/23  Yes   econazole nitrate 1 % cream Apply 1 Application topically as needed (skin irritation).   Yes [provider]  estradiol  (ESTRACE ) 0.1 MG/GM vaginal cream Apply 0.5 grams vaginally nightly for 2 weeks, then apply 0.5 grams 2 times weekly 02/06/23  Yes   fluticasone  (FLONASE ) 50 MCG/ACT nasal spray Place 2 sprays into both nostrils daily. 04/24/23  Yes Copland, Skipper Dumas, MD  losartan  (COZAAR ) 50 MG tablet Take 1 & 1/2 tablets (75 mg total) by mouth daily. 06/21/23 06/20/24 Yes Copland, Skipper Dumas, MD  metFORMIN  (GLUCOPHAGE ) 500 MG tablet Take 1 tablet (500 mg total) by  mouth daily with breakfast. 06/21/23  Yes Copland, Skipper Dumas, MD  mirabegron  ER (MYRBETRIQ ) 50 MG TB24 tablet Take 1 tablet (50 mg total) by mouth daily. 05/23/23  Yes Copland, Skipper Dumas, MD  omega-3 acid ethyl esters (LOVAZA ) 1 g capsule Take 2 capsules (2 g total) by mouth 2 (two) times daily. 05/16/23  Yes Copland, Skipper Dumas, MD  OXYCODONE  HCL PO Take 1 tablet by mouth as needed (pain).   Yes [provider]  pantoprazole  (PROTONIX ) 40 MG tablet Take 1 tablet (40 mg total) by mouth 2 (two) times daily. 09/01/22  Yes   Polyethyl Glycol-Propyl Glycol (SYSTANE OP)  Apply 1 drop to eye daily as needed (for dry eyes).   Yes [provider]  PSYLLIUM PO Take 1 tablet by mouth daily.   Yes [provider]  simvastatin  (ZOCOR ) 20 MG tablet Take 1 tablet (20 mg total) by mouth at bedtime. 03/27/23  Yes Copland, Skipper Dumas, MD  solifenacin  (VESICARE ) 10 MG tablet Take 1 tablet (10 mg total) by mouth daily. Swallow whole do not crush, chew, or split. 06/02/23  Yes   Accu-Chek Softclix Lancets lancets USE AS DIRECTED TO CHECK BLOOD GLUCOSE ONCE DAILY 04/06/23   Copland, Jessica C, MD  blood glucose meter kit and supplies KIT Dispense based on patient and insurance preference. Use once daily to monitor glucose as needed (FOR ICD-9 250.00, 250.01). Patient taking differently: Inject 1 each into the skin as directed. Dispense based on patient and insurance preference. Use once daily to monitor glucose as needed (FOR ICD-9 250.00, 250.01). 12/04/19   Copland, Skipper Dumas, MD  cycloSPORINE  (RESTASIS ) 0.05 % ophthalmic emulsion Place 1 drop into both eyes 2 (two) times daily. Patient not taking: Reported on 07/03/2023 10/19/22     glucose blood test strip Use as directed to check blood glucose daily. 01/10/23 01/09/24  Copland, Jessica C, MD    Inpatient Medications: Scheduled Meds:  Continuous Infusions:  PRN Meds:   Allergies:    Allergies  Allergen Reactions   Latex Itching    Ciprofloxacin Hives   Metronidazole Hives   Tape Rash and Itching    Redness of skin    Social History:   Social History   Socioeconomic History   Marital status: Married    Spouse name: Duana Lautt   Number of children: 1   Years of education: Not on file   Highest education level: Not on file  Occupational History   Occupation: Retired Runner, broadcasting/film/video  Tobacco Use   Smoking status: Never    Passive exposure: Never   Smokeless tobacco: Never  Vaping Use   Vaping status: Never Used  Substance and Sexual Activity   Alcohol use: Yes    Alcohol/week: 2.0 - 3.0 standard drinks of alcohol    Types: 2 - 3 Glasses of wine per week    Comment: couple of glasses per week   Drug use: No   Sexual activity: Not Currently  Other Topics Concern   Not on file  Social History Narrative   Not on file   Social Drivers of Health   Financial Resource Strain: Low Risk  (01/03/2023)   Overall Financial Resource Strain (CARDIA)    Difficulty of Paying Living Expenses: Not hard at all  Food Insecurity: Low Risk  (04/17/2023)   Received from [facility]   Hunger Vital Sign    Worried About Running Out of Food in the Last Year: Never true    Ran Out of Food in the Last Year: Never true  Transportation Needs: No Transportation Needs (04/17/2023)   Received from [facility]    In the past 12 months, has lack of reliable transportation kept you from medical appointments, meetings, work or from getting things needed for daily living? : No  Physical Activity: Sufficiently Active (01/03/2023)   Exercise Vital Sign    Days of Exercise per Week: 3 days    Minutes of Exercise per Session: 60 min  Stress: No Stress Concern Present (01/03/2023)   [employer] of Occupational Health - Occupational Stress Questionnaire    Feeling of Stress : Not at  all  Social Connections: Socially Integrated (01/03/2023)   Social Connection and Isolation Panel [NHANES]    Frequency of  Communication with Friends and Family: More than three times a week    Frequency of Social Gatherings with Friends and Family: More than three times a week    Attends Religious Services: More than 4 times per year    Active Member of [employer] or Organizations: Yes    Attends Engineer, structural: More than 4 times per year    Marital Status: Married  Catering manager Violence: Not At Risk (01/03/2023)   Humiliation, Afraid, Rape, and Kick questionnaire    Fear of Current or Ex-Partner: No    Emotionally Abused: No    Physically Abused: No    Sexually Abused: No    Family History:   Family History  Problem Relation Age of Onset   Hyperlipidemia Mother    Heart disease Mother    Hypertension Mother    Stroke Mother    Thyroid  disease Mother    Hypertension Father    Heart disease Father      ROS:  Please see the history of present illness.   All other ROS reviewed and negative.     Physical Exam/Data:   Vitals:   07/03/23 0200 07/03/23 0415 07/03/23 0415 07/03/23 0430  BP:    (!) 159/44  Pulse: (!) 58 (!) 53  (!) 46  Resp:  18  14  Temp:   98.2 F (36.8 C)   TempSrc:   Oral   SpO2: 98% 100%  98%  Weight:      Height:       No intake or output data in the 24 hours ending 07/03/23 0539    07/02/2023    6:09 PM 06/21/2023   11:04 AM 04/27/2023    9:05 AM  Last 3 Weights  Weight (lbs) 244 lb 6.4 oz 244 lb 6.4 oz 242 lb  Weight (kg) 110.859 kg 110.859 kg 109.77 kg     Body mass index is 46.18 kg/m.  General:  Well nourished, well developed, in no acute distress HEENT: normal Neck: no JVD Vascular: No carotid bruits; Distal pulses 2+ bilaterally Cardiac:  bradycardic, regular, S1, S2; RRR;  Lungs:  clear to auscultation bilaterally, no wheezing, rhonchi or rales  Abd: soft, nontender, no hepatomegaly  Ext: 1+ bilateral LE edema Musculoskeletal:  No deformities, BUE and BLE strength normal and equal Skin: warm and dry  Neuro:   no focal abnormalities  noted Psych:  Normal affect   EKG:  The EKG was personally reviewed and demonstrates:  2:1 av block, ventricular rate 40  Relevant CV Studies: Cardiac  CT 02/17/22 Coronary arteries: Normal origins. Coronary Calcium  Score: Left main: 423 Left anterior descending artery: 174 Left circumflex artery: 158 Right coronary artery: 489 Total: 1245 Percentile: 95th  TTE 07/27/22  1. Left ventricular ejection fraction, by estimation, is 60 to 65%. The left ventricle has normal function. The left ventricle has no regional wall motion abnormalities. There is mild concentric left ventricular hypertrophy. Left ventricular diastolic  parameters are consistent with Grade I diastolic dysfunction (impaired relaxation).   NM stress test 03/02/22 Normal perfusion. LVEF 71% with normal wall motion. This is a low risk study. No prior for comparison.    Laboratory Data:  High Sensitivity Troponin:  No results for input(s): "TROPONINIHS" in the last 720 hours.   Chemistry Recent Labs  Lab 07/02/23 1812 07/03/23 0157  NA 137  --  K 4.0  --   CL 100  --   CO2 23  --   GLUCOSE 174*  --   BUN 18  --   CREATININE 0.75  --   CALCIUM  9.4  --   MG  --  2.0  GFRNONAA >60  --   ANIONGAP 14  --     Recent Labs  Lab 07/02/23 1812  PROT 6.7  ALBUMIN 4.1  AST 24  ALT 21  ALKPHOS 63  BILITOT 0.5   Lipids No results for input(s): "CHOL", "TRIG", "HDL", "LABVLDL", "LDLCALC", "CHOLHDL" in the last 168 hours.  Hematology Recent Labs  Lab 07/02/23 1812  WBC 11.3*  RBC 4.29  HGB 12.3  HCT 37.4  MCV 87.2  MCH 28.7  MCHC 32.9  RDW 15.1  PLT 305   Thyroid  No results for input(s): "TSH", "FREET4" in the last 168 hours.  BNP Recent Labs  Lab 07/03/23 0045  PROBNP 2,556.0*    DDimer  Recent Labs  Lab 07/03/23 0045  DDIMER 0.40     Radiology/Studies:  DG Chest 2 View Result Date: 07/02/2023 CLINICAL DATA:  Chest pain, SOB. EXAM: CHEST - 2 VIEW COMPARISON:  01/17/2022 FINDINGS: The  heart size and mediastinal contours are within normal limits. No pneumothorax or pleural effusion. Lower lung interstitial prominence consistent with scarring or subsegmental atelectasis. Calcified aorta. Thoracic degenerative changes. IMPRESSION: Bibasilar subsegmental atelectasis or scarring. Electronically Signed   By: Sydell Eva M.D.   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 She was found to be in 2:1 block iso known coronary artery calcfiications, possibly worse in the RCA and several risk facotrs including T2Dm, Htn, HLD. Will plan for work-up as below: --TTE --A1c, Lipid panel, TSH --continue telemetry --assess chronotropic competence, please have patient walk while on telemetry (Record time) --suspect patient would likely benefit from an ischemic work-up with LHC given known prior CAC --continue asa 81mg  every day --continue losartan  75mg  every day --continue simvastatin  20mg  at bedtime (goal LDL <70)   Risk Assessment/Risk Scores:    For questions or updates, please contact [facility] Please consult [email or web address] for contact info under    Signed, Milbert Alice, MD  07/03/2023 5:39 AM  Attending Addendum:   History and all data above reviewed.  Patient examined.  I agree with the findings as above.  The patient exam reveals:  VS:  BP (!) 134/54   Pulse (!) 48   Temp 97.7 F (36.5 C)   Resp (!) 25   Ht 5\' 1"  (1.549 m)   Wt 110.9 kg   SpO2 100%   BMI 46.18 kg/m  , BMI Body mass index is 46.18 kg/m. GENERAL:  Well appearing HEENT: Pupils equal round and reactive, fundi not visualized, oral mucosa unremarkable NECK:  No jugular venous distention, waveform within normal limits, carotid upstroke brisk and symmetric, no bruits, no thyromegaly LUNGS:  Clear to auscultation bilaterally HEART:  Bradycardic.   Regular rhythm.  PMI not displaced or sustained,S1 and S2 within normal limits, no S3, no S4, no clicks, no rubs, no murmurs ABD:  Flat, positive bowel sounds normal in frequency in pitch, no bruits, no rebound, no guarding, no midline pulsatile mass, no hepatomegaly, no splenomegaly EXT:  2 plus pulses throughout, no edema, no cyanosis no clubbing SKIN:  No rashes no nodules NEURO:  Cranial nerves II through XII grossly intact, motor grossly intact throughout PSYCH:  Cognitively intact, oriented to person place and time   All available labs, radiology testing, previous records reviewed. Agree with documented assessment and plan. Ms. Konishi is a 78 year old female with diabetes, hypertension, hyperlipidemia, prior stroke, OSA on CPAP, and GERD admitted with fatigue and found to have 2: 1 second-degree AV block.  # AV Block:  Consistent 2:1 AV block.  Heart rate in the 40s at rest.  It did increase to the 70s with exertion.  She is not on any nodal agents at baseline.  Did recently titrate up on her Ozempic .  Ozempic  does not commonly cause AV block.  Certainly could cause increased vagal tone transiently in the setting of symptoms.  However she is currently not having any GI issues and still remains in high degree AV block.  She has OSA but is compliant with her CPAP.  Given her known RCA disease recommend getting left heart cath.  # Coronary calcification:  Coronary calcium  score was 1245 in December 2023.  Subsequent stress testing was negative.  She did have significant calcification in the right coronary.  She does not have classic of symptoms Dr. Coronary symptoms but she has noted exertional dyspnea and fatigue.  Unclear how much of this is related to her bradycardia versus progressive coronary disease.  # Hyperlipidemia:  LDL goal <70.  Will repeat lipids and likely switch simvastatin  to an alternative statin.  Continue Lovaza  for hypertriglyceridemia.  # Hypertension:  Blood pressure  stable now on losartan .  Avoid nodal agents as above.  # Prior CVA:  Continue aspirin  and Plavix .  # Elevated BNP: proBNP elevated to 2556.  She does not appear very volume overloaded on exam.  No significant volume overload on chest x-ray.  Will hold off on diuresis for now and plan for left heart cath tomorrow.   Informed Consent   Shared Decision Making/Informed Consent The risks [stroke (1 in 1000), death (1 in 1000), kidney failure [usually temporary] (1 in 500), bleeding (1 in 200), allergic reaction [possibly serious] (1 in 200)], benefits (diagnostic support and management of coronary artery disease) and alternatives of a cardiac catheterization were discussed in detail with Ms. Fochtman and she is willing to proceed.     Shawonda Kerce C. Theodis Fiscal, MD, [facility]  07/03/2023 10:14 AM

## 2023-07-03 NOTE — ED Provider Notes (Signed)
 Transferred from outside ED to be evaluated by cardiology.  Concern for second-degree heart block.  She denies chest pain or shortness of breath.  She is hemodynamically stable on arrival.  ECG shows prolonged PR.  Cardiology notified of arrival.  Admission discussed with Dr. [facility] Lavender.   Earma Gloss, MD 07/03/23 [phone]

## 2023-07-04 ENCOUNTER — Encounter (HOSPITAL_COMMUNITY): Payer: Self-pay | Admitting: Cardiology

## 2023-07-04 ENCOUNTER — Encounter (HOSPITAL_COMMUNITY)
Admission: EM | Disposition: A | Discharge status: Home / Self Care | Payer: Self-pay | Source: Home / Self Care | Attending: Family Medicine

## 2023-07-04 DIAGNOSIS — I2510 Atherosclerotic heart disease of native coronary artery without angina pectoris: Secondary | ICD-10-CM

## 2023-07-04 DIAGNOSIS — I350 Nonrheumatic aortic (valve) stenosis: Secondary | ICD-10-CM | POA: Diagnosis not present

## 2023-07-04 DIAGNOSIS — E119 Type 2 diabetes mellitus without complications: Secondary | ICD-10-CM | POA: Diagnosis not present

## 2023-07-04 DIAGNOSIS — I441 Atrioventricular block, second degree: Secondary | ICD-10-CM | POA: Diagnosis not present

## 2023-07-04 DIAGNOSIS — I10 Essential (primary) hypertension: Secondary | ICD-10-CM | POA: Diagnosis not present

## 2023-07-04 DIAGNOSIS — G4733 Obstructive sleep apnea (adult) (pediatric): Secondary | ICD-10-CM | POA: Diagnosis not present

## 2023-07-04 DIAGNOSIS — I509 Heart failure, unspecified: Secondary | ICD-10-CM | POA: Diagnosis not present

## 2023-07-04 DIAGNOSIS — R001 Bradycardia, unspecified: Secondary | ICD-10-CM | POA: Diagnosis not present

## 2023-07-04 HISTORY — PX: RIGHT/LEFT HEART CATH AND CORONARY ANGIOGRAPHY: CATH118266

## 2023-07-04 LAB — POCT I-STAT 7, (LYTES, BLD GAS, ICA,H+H)
Acid-Base Excess: 0 mmol/L (ref 0.0–2.0)
Bicarbonate: 23.6 mmol/L (ref 20.0–28.0)
Calcium, Ion: 1.18 mmol/L (ref 1.15–1.40)
HCT: 35 % — ABNORMAL LOW (ref 36.0–46.0)
Hemoglobin: 11.9 g/dL — ABNORMAL LOW (ref 12.0–15.0)
O2 Saturation: 93 %
Potassium: 3.8 mmol/L (ref 3.5–5.1)
Sodium: 140 mmol/L (ref 135–145)
TCO2: 25 mmol/L (ref 22–32)
pCO2 arterial: 33.4 mmHg (ref 32–48)
pH, Arterial: 7.458 — ABNORMAL HIGH (ref 7.35–7.45)
pO2, Arterial: 63 mmHg — ABNORMAL LOW (ref 83–108)

## 2023-07-04 LAB — MAGNESIUM: Magnesium: 2 mg/dL (ref 1.7–2.4)

## 2023-07-04 LAB — CBC
HCT: 39.6 % (ref 36.0–46.0)
Hemoglobin: 12.9 g/dL (ref 12.0–15.0)
MCH: 28.6 pg (ref 26.0–34.0)
MCHC: 32.6 g/dL (ref 30.0–36.0)
MCV: 87.8 fL (ref 80.0–100.0)
Platelets: 304 10*3/uL (ref 150–400)
RBC: 4.51 MIL/uL (ref 3.87–5.11)
RDW: 14.8 % (ref 11.5–15.5)
WBC: 9.1 10*3/uL (ref 4.0–10.5)
nRBC: 0 % (ref 0.0–0.2)

## 2023-07-04 LAB — POCT I-STAT EG7
Acid-base deficit: 1 mmol/L (ref 0.0–2.0)
Acid-base deficit: 1 mmol/L (ref 0.0–2.0)
Acid-base deficit: 1 mmol/L (ref 0.0–2.0)
Bicarbonate: 23.6 mmol/L (ref 20.0–28.0)
Bicarbonate: 23.9 mmol/L (ref 20.0–28.0)
Bicarbonate: 24 mmol/L (ref 20.0–28.0)
Calcium, Ion: 1.19 mmol/L (ref 1.15–1.40)
Calcium, Ion: 1.2 mmol/L (ref 1.15–1.40)
Calcium, Ion: 1.2 mmol/L (ref 1.15–1.40)
HCT: 35 % — ABNORMAL LOW (ref 36.0–46.0)
HCT: 35 % — ABNORMAL LOW (ref 36.0–46.0)
HCT: 36 % (ref 36.0–46.0)
Hemoglobin: 11.9 g/dL — ABNORMAL LOW (ref 12.0–15.0)
Hemoglobin: 11.9 g/dL — ABNORMAL LOW (ref 12.0–15.0)
Hemoglobin: 12.2 g/dL (ref 12.0–15.0)
O2 Saturation: 61 %
O2 Saturation: 65 %
O2 Saturation: 62 %
Potassium: 3.8 mmol/L (ref 3.5–5.1)
Potassium: 3.8 mmol/L (ref 3.5–5.1)
Potassium: 3.8 mmol/L (ref 3.5–5.1)
Sodium: 141 mmol/L (ref 135–145)
Sodium: 141 mmol/L (ref 135–145)
Sodium: 140 mmol/L (ref 135–145)
TCO2: 25 mmol/L (ref 22–32)
TCO2: 25 mmol/L (ref 22–32)
TCO2: 25 mmol/L (ref 22–32)
pCO2, Ven: 37.7 mmHg — ABNORMAL LOW (ref 44–60)
pCO2, Ven: 37.7 mmHg — ABNORMAL LOW (ref 44–60)
pCO2, Ven: 38.4 mmHg — ABNORMAL LOW (ref 44–60)
pH, Ven: 7.404 (ref 7.25–7.43)
pH, Ven: 7.41 (ref 7.25–7.43)
pH, Ven: 7.404 (ref 7.25–7.43)
pO2, Ven: 31 mmHg — CL (ref 32–45)
pO2, Ven: 33 mmHg (ref 32–45)
pO2, Ven: 32 mmHg (ref 32–45)

## 2023-07-04 LAB — CBC WITH DIFFERENTIAL/PLATELET
Abs Immature Granulocytes: 0.03 10*3/uL (ref 0.00–0.07)
Basophils Absolute: 0 10*3/uL (ref 0.0–0.1)
Basophils Relative: 0 %
Eosinophils Absolute: 0.2 10*3/uL (ref 0.0–0.5)
Eosinophils Relative: 2 %
HCT: 36.1 % (ref 36.0–46.0)
Hemoglobin: 11.9 g/dL — ABNORMAL LOW (ref 12.0–15.0)
Immature Granulocytes: 0 %
Lymphocytes Relative: 20 %
Lymphs Abs: 1.9 10*3/uL (ref 0.7–4.0)
MCH: 28.7 pg (ref 26.0–34.0)
MCHC: 33 g/dL (ref 30.0–36.0)
MCV: 87 fL (ref 80.0–100.0)
Monocytes Absolute: 0.6 10*3/uL (ref 0.1–1.0)
Monocytes Relative: 7 %
Neutro Abs: 6.6 10*3/uL (ref 1.7–7.7)
Neutrophils Relative %: 71 %
Platelets: 273 10*3/uL (ref 150–400)
RBC: 4.15 MIL/uL (ref 3.87–5.11)
RDW: 15 % (ref 11.5–15.5)
WBC: 9.4 10*3/uL (ref 4.0–10.5)
nRBC: 0 % (ref 0.0–0.2)

## 2023-07-04 LAB — GLUCOSE, CAPILLARY
Glucose-Capillary: 129 mg/dL — ABNORMAL HIGH (ref 70–99)
Glucose-Capillary: 106 mg/dL — ABNORMAL HIGH (ref 70–99)
Glucose-Capillary: 96 mg/dL (ref 70–99)
Glucose-Capillary: 143 mg/dL — ABNORMAL HIGH (ref 70–99)

## 2023-07-04 LAB — CREATININE, SERUM
Creatinine, Ser: 0.77 mg/dL (ref 0.44–1.00)
GFR, Estimated: >60 mL/min (ref >60)

## 2023-07-04 LAB — BASIC METABOLIC PANEL WITH GFR
Anion gap: 11 (ref 5–15)
BUN: 22 mg/dL (ref 8–23)
CO2: 25 mmol/L (ref 22–32)
Calcium: 9.2 mg/dL (ref 8.9–10.3)
Chloride: 103 mmol/L (ref 98–111)
Creatinine, Ser: 0.81 mg/dL (ref 0.44–1.00)
GFR, Estimated: >60 mL/min (ref >60)
Glucose, Bld: 134 mg/dL — ABNORMAL HIGH (ref 70–99)
Potassium: 4.2 mmol/L (ref 3.5–5.1)
Sodium: 139 mmol/L (ref 135–145)

## 2023-07-04 SURGERY — RIGHT/LEFT HEART CATH AND CORONARY ANGIOGRAPHY
Anesthesia: LOCAL

## 2023-07-04 MED ORDER — SODIUM CHLORIDE 0.9 % IV SOLN
INTRAVENOUS | Status: AC | PRN
  Administered 2023-07-04: 10 mL/h via INTRAVENOUS

## 2023-07-04 MED ORDER — VERAPAMIL HCL 2.5 MG/ML IV SOLN
INTRAVENOUS | Status: AC
  Filled 2023-07-04: qty 2

## 2023-07-04 MED ORDER — LIDOCAINE HCL (PF) 1 % IJ SOLN
INTRAMUSCULAR | Status: DC | PRN
  Administered 2023-07-04: 5 mL

## 2023-07-04 MED ORDER — CYCLOSPORINE 0.05 % OP EMUL
1.0000 [drp] | Freq: Two times a day (BID) | OPHTHALMIC | Status: DC
Start: 2023-07-04 — End: 2023-07-08
  Administered 2023-07-04 – 2023-07-08 (×9): 1 [drp] via OPHTHALMIC
  Filled 2023-07-04 (×9): qty 30

## 2023-07-04 MED ORDER — ACETAMINOPHEN 325 MG PO TABS: 650.0000 mg | ORAL_TABLET | ORAL | Status: DC | PRN

## 2023-07-04 MED ORDER — HEPARIN SODIUM (PORCINE) 1000 UNIT/ML IJ SOLN
INTRAMUSCULAR | Status: DC | PRN
  Administered 2023-07-04: 5000 [IU] via INTRAVENOUS

## 2023-07-04 MED ORDER — FLUTICASONE PROPIONATE 50 MCG/ACT NA SUSP
2.0000 | Freq: Every day | NASAL | Status: DC
  Administered 2023-07-04 – 2023-07-08 (×5): 2 via NASAL
  Filled 2023-07-04: qty 16

## 2023-07-04 MED ORDER — SODIUM CHLORIDE 0.9 % WEIGHT BASED INFUSION
3.0000 mL/kg/h | INTRAVENOUS | Status: DC
Start: 2023-07-05 — End: 2023-07-04

## 2023-07-04 MED ORDER — CHLORHEXIDINE GLUCONATE CLOTH 2 % EX PADS
6.0000 | MEDICATED_PAD | Freq: Once | CUTANEOUS | Status: AC
  Administered 2023-07-04: 6 via TOPICAL

## 2023-07-04 MED ORDER — VERAPAMIL HCL 2.5 MG/ML IV SOLN
INTRAVENOUS | Status: DC | PRN
  Administered 2023-07-04: 10 mL via INTRA_ARTERIAL

## 2023-07-04 MED ORDER — HEPARIN (PORCINE) IN NACL 2000-0.9 UNIT/L-% IV SOLN
INTRAVENOUS | Status: DC | PRN
  Administered 2023-07-04: 1000 mL

## 2023-07-04 MED ORDER — SODIUM CHLORIDE 0.9 % WEIGHT BASED INFUSION
3.0000 mL/kg/h | INTRAVENOUS | Status: AC
  Administered 2023-07-04: 3 mL/kg/h via INTRAVENOUS

## 2023-07-04 MED ORDER — LABETALOL HCL 5 MG/ML IV SOLN: 10.0000 mg | INTRAVENOUS | Status: AC | PRN

## 2023-07-04 MED ORDER — OXYCODONE HCL 5 MG PO TABS: 5.0000 mg | ORAL_TABLET | Freq: Four times a day (QID) | ORAL | Status: DC | PRN

## 2023-07-04 MED ORDER — SODIUM CHLORIDE 0.9 % WEIGHT BASED INFUSION: 1.0000 mL/kg/h | INTRAVENOUS | Status: DC

## 2023-07-04 MED ORDER — SODIUM CHLORIDE 0.9 % IV SOLN: INTRAVENOUS | Status: AC

## 2023-07-04 MED ORDER — SODIUM CHLORIDE 0.9 % IV SOLN: 250.0000 mL | INTRAVENOUS | Status: AC | PRN

## 2023-07-04 MED ORDER — ONDANSETRON HCL 4 MG/2ML IJ SOLN: 4.0000 mg | Freq: Four times a day (QID) | INTRAMUSCULAR | Status: DC | PRN

## 2023-07-04 MED ORDER — LIDOCAINE HCL (PF) 1 % IJ SOLN
INTRAMUSCULAR | Status: AC
  Filled 2023-07-04: qty 30

## 2023-07-04 MED ORDER — HYDRALAZINE HCL 20 MG/ML IJ SOLN: 10.0000 mg | INTRAMUSCULAR | Status: AC | PRN

## 2023-07-04 MED ORDER — POLYETHYL GLYCOL-PROPYL GLYCOL 0.4-0.3 % OP GEL: Freq: Every day | OPHTHALMIC | Status: DC | PRN

## 2023-07-04 MED ORDER — ASPIRIN 81 MG PO CHEW
81.0000 mg | CHEWABLE_TABLET | ORAL | Status: AC
  Administered 2023-07-04: 81 mg via ORAL
  Filled 2023-07-04: qty 1

## 2023-07-04 MED ORDER — SODIUM CHLORIDE 0.9% FLUSH
3.0000 mL | Freq: Two times a day (BID) | INTRAVENOUS | Status: DC
  Administered 2023-07-04 – 2023-07-06 (×5): 3 mL via INTRAVENOUS

## 2023-07-04 MED ORDER — IOHEXOL 350 MG/ML SOLN
INTRAVENOUS | Status: DC | PRN
  Administered 2023-07-04: 110 mL

## 2023-07-04 MED ORDER — ASPIRIN 81 MG PO CHEW: 81.0000 mg | CHEWABLE_TABLET | ORAL | Status: DC

## 2023-07-04 MED ORDER — SODIUM CHLORIDE 0.9% FLUSH: 3.0000 mL | INTRAVENOUS | Status: DC | PRN

## 2023-07-04 MED ORDER — HEPARIN SODIUM (PORCINE) 1000 UNIT/ML IJ SOLN
INTRAMUSCULAR | Status: AC
  Filled 2023-07-04: qty 10

## 2023-07-04 SURGICAL SUPPLY — 17 items
CATH BALLN WEDGE 5F 110CM (CATHETERS) IMPLANT
CATH INFINITI 5FR AL1 (CATHETERS) IMPLANT
CATH INFINITI 5FR ANG PIGTAIL (CATHETERS) IMPLANT
CATH INFINITI AMBI 5FR TG (CATHETERS) IMPLANT
CATH INFINITI JR4 5F (CATHETERS) IMPLANT
CATH LAUNCHER 5F NOTO (CATHETERS) IMPLANT
DEVICE RAD COMP TR BAND LRG (VASCULAR PRODUCTS) IMPLANT
GLIDESHEATH SLEND SS 6F .021 (SHEATH) IMPLANT
GUIDEWIRE .025 260CM (WIRE) IMPLANT
GUIDEWIRE INQWIRE 1.5J.035X260 (WIRE) IMPLANT
KIT ENCORE 26 ADVANTAGE (KITS) IMPLANT
KIT MICROPUNCTURE NIT STIFF (SHEATH) IMPLANT
PACK CARDIAC CATHETERIZATION (CUSTOM PROCEDURE TRAY) ×1 IMPLANT
SET ATX-X65L (MISCELLANEOUS) IMPLANT
SHEATH GLIDE SLENDER 4/5FR (SHEATH) IMPLANT
SHEATH PROBE COVER 6X72 (BAG) IMPLANT
WIRE EMERALD 3MM-J .025X260CM (WIRE) IMPLANT

## 2023-07-04 NOTE — Progress Notes (Signed)
 The patient  had 2.88 seconds pause at 0305 this morning. She was asymptomatic on reassessment. Notified Dr. Michell Ahumada without any new order. Will continue to monitor.

## 2023-07-04 NOTE — Progress Notes (Signed)
 Rounding Note    Patient Name: Jasmin [name] Date of Encounter: 07/04/2023  [facility] Cardiologist: Ralene Burger, MD   Subjective   Patient reports feeling well Just walked with therapy in the hall and did well Denies any symptoms at this time HR remains in the 40s at rest   Inpatient Medications    Scheduled Meds:  aspirin  EC  81 mg Oral Daily   clopidogrel   75 mg Oral Daily   cycloSPORINE   1 drop Both Eyes BID   desmopressin   0.2 mg Oral QHS   enoxaparin (LOVENOX) injection  40 mg Subcutaneous Q24H   fesoterodine  4 mg Oral Daily   fluticasone   2 spray Each Nare Daily   insulin  aspart  0-9 Units Subcutaneous TID WC   losartan   75 mg Oral Daily   mirabegron  ER  50 mg Oral Daily   omega-3 acid ethyl esters  2 g Oral BID   pantoprazole   40 mg Oral BID   simvastatin   20 mg Oral QHS   Continuous Infusions:  sodium chloride 1 mL/kg/hr (07/04/23 0651)   cefTRIAXone (ROCEPHIN)  IV 2 g (07/04/23 0516)   PRN Meds: oxyCODONE , Polyethyl Glycol-Propyl Glycol   Vital Signs    Vitals:   07/03/23 2019 07/03/23 2330 07/04/23 0430 07/04/23 0830  BP: (!) 154/53 (!) 145/42 (!) 152/48 (!) 128/46  Pulse: (!) 46 (!) 46 (!) 47 (!) 42  Resp: 19 18 19 18   Temp: 98.2 F (36.8 C) 98.2 F (36.8 C) 98 F (36.7 C) 97.8 F (36.6 C)  TempSrc: Oral Oral Oral Oral  SpO2: 98% 98% 98% 99%  Weight:   108 kg   Height:        Intake/Output Summary (Last 24 hours) at 07/04/2023 0955 Last data filed at 07/04/2023 0651 Gross per 24 hour  Intake 660.05 ml  Output --  Net 660.05 ml      07/04/2023    4:30 AM 07/02/2023    6:09 PM 06/21/2023   11:04 AM  Last 3 Weights  Weight (lbs) 238 lb 244 lb 6.4 oz 244 lb 6.4 oz  Weight (kg) 107.956 kg 110.859 kg 110.859 kg      Telemetry    AV block, HR 40s at rest  - Personally Reviewed  Physical Exam   GEN: No acute distress.   Neck: No JVD Cardiac: bradycardic, no murmurs, rubs, or gallops.  Respiratory: Clear to  auscultation bilaterally. GI: Soft, nontender, non-distended  MS: No edema; No deformity. Neuro:  Nonfocal  Psych: Normal affect   Labs    High Sensitivity Troponin:   Recent Labs  Lab 07/03/23 0748 07/03/23 1220  TROPONINIHS 12 14     Chemistry Recent Labs  Lab 07/02/23 1812 07/03/23 0157 07/03/23 0748 07/04/23 0427  NA 137  --  139 139  K 4.0  --  3.9 4.2  CL 100  --  105 103  CO2 23  --  21* 25  GLUCOSE 174*  --  136* 134*  BUN 18  --  13 22  CREATININE 0.75  --  0.62 0.81  CALCIUM  9.4  --  9.0 9.2  MG  --  2.0  --  2.0  PROT 6.7  --   --   --   ALBUMIN 4.1  --   --   --   AST 24  --   --   --   ALT 21  --   --   --  ALKPHOS 63  --   --   --   BILITOT 0.5  --   --   --   GFRNONAA >60  --  >60 >60  ANIONGAP 14  --  13 11    Lipids  Recent Labs  Lab 07/03/23 0748  CHOL 127  TRIG 134  HDL 36*  LDLCALC 64  CHOLHDL 3.5    Hematology Recent Labs  Lab 07/02/23 1812 07/03/23 0748 07/04/23 0427  WBC 11.3* 11.0* 9.4  RBC 4.29 4.28 4.15  HGB 12.3 12.4 11.9*  HCT 37.4 39.1 36.1  MCV 87.2 91.4 87.0  MCH 28.7 29.0 28.7  MCHC 32.9 31.7 33.0  RDW 15.1 15.1 15.0  PLT 305 276 273   Thyroid   Recent Labs  Lab 07/03/23 0653  TSH 2.269    BNP Recent Labs  Lab 07/03/23 0045  PROBNP 2,556.0*    DDimer  Recent Labs  Lab 07/03/23 0045  DDIMER 0.40    Radiology    ECHOCARDIOGRAM COMPLETE Result Date: 07/03/2023    ECHOCARDIOGRAM REPORT   Patient Name:   Jasmin [name] Date of Exam: 07/03/2023 Medical Rec #:  [id number]       Height:       61.0 in Accession #:    [id number]      Weight:       244.4 lb Date of Birth:  03-Feb-1946       BSA:          2.057 m Patient Age:    78 years        BP:           118/75 mmHg Patient Gender: F               HR:           59 bpm. Exam Location:  Inpatient Procedure: 2D Echo, Cardiac Doppler and Color Doppler (Both Spectral and Color            Flow Doppler were utilized during procedure). Indications:    Fatigue, heart  block, shortness of breath  History:        Patient has prior history of Echocardiogram examinations.  Sonographer:    Janette Medley Referring Phys: [id number] DANIEL V THOMPSON IMPRESSIONS  1. Left ventricular ejection fraction, by estimation, is 60 to 65%. The left ventricle has normal function. The left ventricle has no regional wall motion abnormalities. There is moderate asymmetric left ventricular hypertrophy of the basal-septal segment. Left ventricular diastolic parameters are consistent with Grade II diastolic dysfunction (pseudonormalization).  2. Right ventricular systolic function is normal. The right ventricular size is normal. Tricuspid regurgitation signal is inadequate for assessing PA pressure.  3. Left atrial size was mildly dilated.  4. The mitral valve is degenerative. Trivial mitral valve regurgitation. No evidence of mitral stenosis. Moderate mitral annular calcification.  5. The aortic valve is tricuspid. There is moderate calcification of the aortic valve. Aortic valve regurgitation is mild. Moderate aortic valve stenosis. Aortic valve mean gradient measures 23.0 mmHg, AVA 1.38 cm^2.  6. Aortic dilatation noted. There is mild dilatation of the ascending aorta, measuring 40 mm.  7. The inferior vena cava is dilated in size with >50% respiratory variability, suggesting right atrial pressure of 8 mmHg. FINDINGS  Left Ventricle: Left ventricular ejection fraction, by estimation, is 60 to 65%. The left ventricle has normal function. The left ventricle has no regional wall motion abnormalities. The left ventricular internal cavity size was normal in size. There is  moderate asymmetric left ventricular hypertrophy of the basal-septal segment. Left ventricular diastolic parameters are consistent with Grade II diastolic dysfunction (pseudonormalization). Right Ventricle: The right ventricular size is normal. No increase in right ventricular wall thickness. Right ventricular systolic function is normal.  Tricuspid regurgitation signal is inadequate for assessing PA pressure. Left Atrium: Left atrial size was mildly dilated. Right Atrium: Right atrial size was normal in size. Pericardium: There is no evidence of pericardial effusion. Mitral Valve: The mitral valve is degenerative in appearance. There is mild calcification of the mitral valve leaflet(s). Moderate mitral annular calcification. Trivial mitral valve regurgitation. No evidence of mitral valve stenosis. Tricuspid Valve: The tricuspid valve is normal in structure. Tricuspid valve regurgitation is not demonstrated. Aortic Valve: The aortic valve is tricuspid. There is moderate calcification of the aortic valve. Aortic valve regurgitation is mild. Moderate aortic stenosis is present. Aortic valve mean gradient measures 23.0 mmHg. Aortic valve peak gradient measures 22.7 mmHg. Aortic valve area, by VTI measures 1.99 cm. Pulmonic Valve: The pulmonic valve was normal in structure. Pulmonic valve regurgitation is not visualized. Aorta: The aortic root is normal in size and structure and aortic dilatation noted. There is mild dilatation of the ascending aorta, measuring 40 mm. Venous: The inferior vena cava is dilated in size with greater than 50% respiratory variability, suggesting right atrial pressure of 8 mmHg. IAS/Shunts: No atrial level shunt detected by color flow Doppler.  LEFT VENTRICLE PLAX 2D LVIDd:         4.60 cm   Diastology LVIDs:         3.20 cm   LV e' lateral: 12.80 cm/s LV PW:         1.30 cm LV IVS:        1.50 cm LVOT diam:     2.00 cm LV SV:         108 LV SV Index:   53 LVOT Area:     3.14 cm  RIGHT VENTRICLE             IVC RV S prime:     17.80 cm/s  IVC diam: 2.40 cm TAPSE (M-mode): 3.1 cm LEFT ATRIUM              Index        RIGHT ATRIUM           Index LA diam:        3.10 cm  1.51 cm/m   RA Area:     17.30 cm LA Vol (A2C):   114.0 ml 55.43 ml/m  RA Volume:   46.00 ml  22.37 ml/m LA Vol (A4C):   37.3 ml  18.14 ml/m LA Biplane  Vol: 70.0 ml  34.03 ml/m  AORTIC VALVE AV Area (Vmax):    1.73 cm AV Area (Vmean):   1.61 cm AV Area (VTI):     1.99 cm AV Vmax:           238.00 cm/s AV Vmean:          164.500 cm/s AV VTI:            0.542 m AV Peak Grad:      22.7 mmHg AV Mean Grad:      23.0 mmHg LVOT Vmax:         131.00 cm/s LVOT Vmean:        84.100 cm/s LVOT VTI:          0.344 m LVOT/AV VTI ratio: 0.64  AORTA Ao  Root diam: 2.80 cm Ao Asc diam:  4.00 cm  SHUNTS Systemic VTI:  0.34 m Systemic Diam: 2.00 cm Dalton McleanMD Electronically signed by Archer Bear Signature Date/Time: 07/03/2023/6:38:42 PM    Final    DG Chest 2 View Result Date: 07/02/2023 CLINICAL DATA:  Chest pain, SOB. EXAM: CHEST - 2 VIEW COMPARISON:  01/17/2022 FINDINGS: The heart size and mediastinal contours are within normal limits. No pneumothorax or pleural effusion. Lower lung interstitial prominence consistent with scarring or subsegmental atelectasis. Calcified aorta. Thoracic degenerative changes. IMPRESSION: Bibasilar subsegmental atelectasis or scarring. Electronically Signed   By: Sydell Eva M.D.   On: 07/02/2023 20:26   Cardiac Studies   Echocardiogram, 07/02/2023 Left ventricular ejection fraction, by estimation, is 60 to 65% . The left ventricle has normal function. The left ventricle has no regional wall motion abnormalities. There is moderate asymmetric left ventricular hypertrophy of the basal- septal segment. Left ventricular diastolic parameters are consistent with Grade II diastolic dysfunction ( pseudonormalization) .  Right ventricular systolic function is normal. The right ventricular size is normal. Tricuspid regurgitation signal is inadequate for assessing PA pressure.  Left atrial size was mildly dilated.  The mitral valve is degenerative. Trivial mitral valve regurgitation. No evidence of mitral stenosis. Moderate mitral annular calcification.  The aortic valve is tricuspid. There is moderate calcification of the aortic  valve. Aortic valve regurgitation is mild. Moderate aortic valve stenosis. Aortic valve mean gradient measures 23. 0 mmHg, AVA 1. 38 cm^ 2.  Aortic dilatation noted. There is mild dilatation of the ascending aorta, measuring 40 mm.  The inferior vena cava is dilated in size with > 50% respiratory variability, suggesting right atrial pressure of 8 mmHg.  LHC/RHC, 07/04/2023 pending   Patient Profile     78 y.o. 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   Assessment & Plan    2:1 AV Block Coronary calcification on CT Moderate AS, mild AR  HR 40-50s at rest Not currently on any AV nodal blocking agents  Calcium  score 1,245 in 01/2022 Stress test was negative Significant calcification of RCA Echo showed: EF 60-65%, G2DD, moderate AS, mild AR Patient denies any current symptoms  Scheduled for LHC today to evaluate CAD Currently on ASA + Plavix  with history of prior CVA  Hyperlipidemia, LDL goal < 70  10/10/2022: Direct LDL 100.0 07/02/2023: ALT 21 07/03/2023: HDL 36; LDL Cholesterol 64  Continue simvastatin  for now with LDL at goal   Hypertension  BP stable Currently on losartan  75 mg daily   Elevated BNP BNP 2,556 Euvolemic on exam CXR clear Hold off diuresis prior to RHC/LHC    For questions or updates, please contact [facility] Please consult [email or web address] for contact info under        Signed, Jiles Mote, PA-C  07/04/2023, 9:55 AM

## 2023-07-04 NOTE — Interval H&P Note (Signed)
 History and Physical Interval Note:  07/04/2023 2:11 PM  Lakken Cipolla  has presented today for surgery, with the diagnosis of chest pain.  The various methods of treatment have been discussed with the patient and family. After consideration of risks, benefits and other options for treatment, the patient has consented to  Procedure(s): RIGHT/LEFT HEART CATH AND CORONARY ANGIOGRAPHY (N/A)  PERCUTANEOUS CORONARY INTERVENTION  as a surgical intervention.  The patient's history has been reviewed, patient examined, no change in status, stable for surgery.  I have reviewed the patient's chart and labs.  Questions were answered to the patient's satisfaction.    Cath Lab Visit (complete for each Cath Lab visit)  Clinical Evaluation Leading to the Procedure:   ACS: No.  Non-ACS:    Anginal Classification: No Symptoms  Anti-ischemic medical therapy: Minimal Therapy (1 class of medications)  Non-Invasive Test Results: No non-invasive testing performed  Prior CABG: No previous CABG   Randene Bustard

## 2023-07-04 NOTE — Plan of Care (Signed)
  Problem: Coping: Goal: Ability to adjust to condition or change in health will improve Outcome: Progressing   Problem: Health Behavior/Discharge Planning: Goal: Ability to manage health-related needs will improve Outcome: Progressing   Problem: Education: Goal: Knowledge of General Education information will improve Description: Including pain rating scale, medication(s)/side effects and non-pharmacologic comfort measures Outcome: Progressing   Problem: Activity: Goal: Risk for activity intolerance will decrease Outcome: Progressing   Problem: Elimination: Goal: Will not experience complications related to urinary retention Outcome: Progressing   Problem: Pain Managment: Goal: General experience of comfort will improve and/or be controlled Outcome: Progressing   Problem: Clinical Measurements: Goal: Will remain free from infection Outcome: Not Progressing Goal: Cardiovascular complication will be avoided Outcome: Not Progressing

## 2023-07-04 NOTE — H&P (View-Only) (Signed)
 Rounding Note    Patient Name: Jasmin [name] Date of Encounter: 07/04/2023  [facility] Cardiologist: Ralene Burger, MD   Subjective   Patient reports feeling well Just walked with therapy in the hall and did well Denies any symptoms at this time HR remains in the 40s at rest   Inpatient Medications    Scheduled Meds:  aspirin  EC  81 mg Oral Daily   clopidogrel   75 mg Oral Daily   cycloSPORINE   1 drop Both Eyes BID   desmopressin   0.2 mg Oral QHS   enoxaparin (LOVENOX) injection  40 mg Subcutaneous Q24H   fesoterodine  4 mg Oral Daily   fluticasone   2 spray Each Nare Daily   insulin  aspart  0-9 Units Subcutaneous TID WC   losartan   75 mg Oral Daily   mirabegron  ER  50 mg Oral Daily   omega-3 acid ethyl esters  2 g Oral BID   pantoprazole   40 mg Oral BID   simvastatin   20 mg Oral QHS   Continuous Infusions:  sodium chloride 1 mL/kg/hr (07/04/23 0651)   cefTRIAXone (ROCEPHIN)  IV 2 g (07/04/23 0516)   PRN Meds: oxyCODONE , Polyethyl Glycol-Propyl Glycol   Vital Signs    Vitals:   07/03/23 2019 07/03/23 2330 07/04/23 0430 07/04/23 0830  BP: (!) 154/53 (!) 145/42 (!) 152/48 (!) 128/46  Pulse: (!) 46 (!) 46 (!) 47 (!) 42  Resp: 19 18 19 18   Temp: 98.2 F (36.8 C) 98.2 F (36.8 C) 98 F (36.7 C) 97.8 F (36.6 C)  TempSrc: Oral Oral Oral Oral  SpO2: 98% 98% 98% 99%  Weight:   108 kg   Height:        Intake/Output Summary (Last 24 hours) at 07/04/2023 0955 Last data filed at 07/04/2023 0651 Gross per 24 hour  Intake 660.05 ml  Output --  Net 660.05 ml      07/04/2023    4:30 AM 07/02/2023    6:09 PM 06/21/2023   11:04 AM  Last 3 Weights  Weight (lbs) 238 lb 244 lb 6.4 oz 244 lb 6.4 oz  Weight (kg) 107.956 kg 110.859 kg 110.859 kg      Telemetry    AV block, HR 40s at rest  - Personally Reviewed  Physical Exam   GEN: No acute distress.   Neck: No JVD Cardiac: bradycardic, no murmurs, rubs, or gallops.  Respiratory: Clear to  auscultation bilaterally. GI: Soft, nontender, non-distended  MS: No edema; No deformity. Neuro:  Nonfocal  Psych: Normal affect   Labs    High Sensitivity Troponin:   Recent Labs  Lab 07/03/23 0748 07/03/23 1220  TROPONINIHS 12 14     Chemistry Recent Labs  Lab 07/02/23 1812 07/03/23 0157 07/03/23 0748 07/04/23 0427  NA 137  --  139 139  K 4.0  --  3.9 4.2  CL 100  --  105 103  CO2 23  --  21* 25  GLUCOSE 174*  --  136* 134*  BUN 18  --  13 22  CREATININE 0.75  --  0.62 0.81  CALCIUM  9.4  --  9.0 9.2  MG  --  2.0  --  2.0  PROT 6.7  --   --   --   ALBUMIN 4.1  --   --   --   AST 24  --   --   --   ALT 21  --   --   --  ALKPHOS 63  --   --   --   BILITOT 0.5  --   --   --   GFRNONAA >60  --  >60 >60  ANIONGAP 14  --  13 11    Lipids  Recent Labs  Lab 07/03/23 0748  CHOL 127  TRIG 134  HDL 36*  LDLCALC 64  CHOLHDL 3.5    Hematology Recent Labs  Lab 07/02/23 1812 07/03/23 0748 07/04/23 0427  WBC 11.3* 11.0* 9.4  RBC 4.29 4.28 4.15  HGB 12.3 12.4 11.9*  HCT 37.4 39.1 36.1  MCV 87.2 91.4 87.0  MCH 28.7 29.0 28.7  MCHC 32.9 31.7 33.0  RDW 15.1 15.1 15.0  PLT 305 276 273   Thyroid   Recent Labs  Lab 07/03/23 0653  TSH 2.269    BNP Recent Labs  Lab 07/03/23 0045  PROBNP 2,556.0*    DDimer  Recent Labs  Lab 07/03/23 0045  DDIMER 0.40    Radiology    ECHOCARDIOGRAM COMPLETE Result Date: 07/03/2023    ECHOCARDIOGRAM REPORT   Patient Name:   MONEKE LINCICOME Date of Exam: 07/03/2023 Medical Rec #:  [id number]       Height:       61.0 in Accession #:    [id number]      Weight:       244.4 lb Date of Birth:  03-Feb-1946       BSA:          2.057 m Patient Age:    78 years        BP:           118/75 mmHg Patient Gender: F               HR:           59 bpm. Exam Location:  Inpatient Procedure: 2D Echo, Cardiac Doppler and Color Doppler (Both Spectral and Color            Flow Doppler were utilized during procedure). Indications:    Fatigue, heart  block, shortness of breath  History:        Patient has prior history of Echocardiogram examinations.  Sonographer:    Janette Medley Referring Phys: [id number] DANIEL V THOMPSON IMPRESSIONS  1. Left ventricular ejection fraction, by estimation, is 60 to 65%. The left ventricle has normal function. The left ventricle has no regional wall motion abnormalities. There is moderate asymmetric left ventricular hypertrophy of the basal-septal segment. Left ventricular diastolic parameters are consistent with Grade II diastolic dysfunction (pseudonormalization).  2. Right ventricular systolic function is normal. The right ventricular size is normal. Tricuspid regurgitation signal is inadequate for assessing PA pressure.  3. Left atrial size was mildly dilated.  4. The mitral valve is degenerative. Trivial mitral valve regurgitation. No evidence of mitral stenosis. Moderate mitral annular calcification.  5. The aortic valve is tricuspid. There is moderate calcification of the aortic valve. Aortic valve regurgitation is mild. Moderate aortic valve stenosis. Aortic valve mean gradient measures 23.0 mmHg, AVA 1.38 cm^2.  6. Aortic dilatation noted. There is mild dilatation of the ascending aorta, measuring 40 mm.  7. The inferior vena cava is dilated in size with >50% respiratory variability, suggesting right atrial pressure of 8 mmHg. FINDINGS  Left Ventricle: Left ventricular ejection fraction, by estimation, is 60 to 65%. The left ventricle has normal function. The left ventricle has no regional wall motion abnormalities. The left ventricular internal cavity size was normal in size. There is  moderate asymmetric left ventricular hypertrophy of the basal-septal segment. Left ventricular diastolic parameters are consistent with Grade II diastolic dysfunction (pseudonormalization). Right Ventricle: The right ventricular size is normal. No increase in right ventricular wall thickness. Right ventricular systolic function is normal.  Tricuspid regurgitation signal is inadequate for assessing PA pressure. Left Atrium: Left atrial size was mildly dilated. Right Atrium: Right atrial size was normal in size. Pericardium: There is no evidence of pericardial effusion. Mitral Valve: The mitral valve is degenerative in appearance. There is mild calcification of the mitral valve leaflet(s). Moderate mitral annular calcification. Trivial mitral valve regurgitation. No evidence of mitral valve stenosis. Tricuspid Valve: The tricuspid valve is normal in structure. Tricuspid valve regurgitation is not demonstrated. Aortic Valve: The aortic valve is tricuspid. There is moderate calcification of the aortic valve. Aortic valve regurgitation is mild. Moderate aortic stenosis is present. Aortic valve mean gradient measures 23.0 mmHg. Aortic valve peak gradient measures 22.7 mmHg. Aortic valve area, by VTI measures 1.99 cm. Pulmonic Valve: The pulmonic valve was normal in structure. Pulmonic valve regurgitation is not visualized. Aorta: The aortic root is normal in size and structure and aortic dilatation noted. There is mild dilatation of the ascending aorta, measuring 40 mm. Venous: The inferior vena cava is dilated in size with greater than 50% respiratory variability, suggesting right atrial pressure of 8 mmHg. IAS/Shunts: No atrial level shunt detected by color flow Doppler.  LEFT VENTRICLE PLAX 2D LVIDd:         4.60 cm   Diastology LVIDs:         3.20 cm   LV e' lateral: 12.80 cm/s LV PW:         1.30 cm LV IVS:        1.50 cm LVOT diam:     2.00 cm LV SV:         108 LV SV Index:   53 LVOT Area:     3.14 cm  RIGHT VENTRICLE             IVC RV S prime:     17.80 cm/s  IVC diam: 2.40 cm TAPSE (M-mode): 3.1 cm LEFT ATRIUM              Index        RIGHT ATRIUM           Index LA diam:        3.10 cm  1.51 cm/m   RA Area:     17.30 cm LA Vol (A2C):   114.0 ml 55.43 ml/m  RA Volume:   46.00 ml  22.37 ml/m LA Vol (A4C):   37.3 ml  18.14 ml/m LA Biplane  Vol: 70.0 ml  34.03 ml/m  AORTIC VALVE AV Area (Vmax):    1.73 cm AV Area (Vmean):   1.61 cm AV Area (VTI):     1.99 cm AV Vmax:           238.00 cm/s AV Vmean:          164.500 cm/s AV VTI:            0.542 m AV Peak Grad:      22.7 mmHg AV Mean Grad:      23.0 mmHg LVOT Vmax:         131.00 cm/s LVOT Vmean:        84.100 cm/s LVOT VTI:          0.344 m LVOT/AV VTI ratio: 0.64  AORTA Ao  Root diam: 2.80 cm Ao Asc diam:  4.00 cm  SHUNTS Systemic VTI:  0.34 m Systemic Diam: 2.00 cm Dalton McleanMD Electronically signed by Archer Bear Signature Date/Time: 07/03/2023/6:38:42 PM    Final    DG Chest 2 View Result Date: 07/02/2023 CLINICAL DATA:  Chest pain, SOB. EXAM: CHEST - 2 VIEW COMPARISON:  01/17/2022 FINDINGS: The heart size and mediastinal contours are within normal limits. No pneumothorax or pleural effusion. Lower lung interstitial prominence consistent with scarring or subsegmental atelectasis. Calcified aorta. Thoracic degenerative changes. IMPRESSION: Bibasilar subsegmental atelectasis or scarring. Electronically Signed   By: Sydell Eva M.D.   On: 07/02/2023 20:26   Cardiac Studies   Echocardiogram, 07/02/2023 Left ventricular ejection fraction, by estimation, is 60 to 65% . The left ventricle has normal function. The left ventricle has no regional wall motion abnormalities. There is moderate asymmetric left ventricular hypertrophy of the basal- septal segment. Left ventricular diastolic parameters are consistent with Grade II diastolic dysfunction ( pseudonormalization) .  Right ventricular systolic function is normal. The right ventricular size is normal. Tricuspid regurgitation signal is inadequate for assessing PA pressure.  Left atrial size was mildly dilated.  The mitral valve is degenerative. Trivial mitral valve regurgitation. No evidence of mitral stenosis. Moderate mitral annular calcification.  The aortic valve is tricuspid. There is moderate calcification of the aortic  valve. Aortic valve regurgitation is mild. Moderate aortic valve stenosis. Aortic valve mean gradient measures 23. 0 mmHg, AVA 1. 38 cm^ 2.  Aortic dilatation noted. There is mild dilatation of the ascending aorta, measuring 40 mm.  The inferior vena cava is dilated in size with > 50% respiratory variability, suggesting right atrial pressure of 8 mmHg.  LHC/RHC, 07/04/2023 pending   Patient Profile     78 y.o. 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   Assessment & Plan    2:1 AV Block Coronary calcification on CT Moderate AS, mild AR  HR 40-50s at rest Not currently on any AV nodal blocking agents  Calcium  score 1,245 in 01/2022 Stress test was negative Significant calcification of RCA Echo showed: EF 60-65%, G2DD, moderate AS, mild AR Patient denies any current symptoms  Scheduled for [facility] today to evaluate CAD Currently on ASA + Plavix  with history of prior CVA  Hyperlipidemia, LDL goal < 70  10/10/2022: Direct LDL 100.0 07/02/2023: ALT 21 07/03/2023: HDL 36; LDL Cholesterol 64  Continue simvastatin  for now with LDL at goal   Hypertension  BP stable Currently on losartan  75 mg daily   Elevated BNP BNP 2,556 Euvolemic on exam CXR clear Hold off diuresis prior to RHC/LHC    For questions or updates, please contact [facility] Please consult [email or web address] for contact info under        Signed, Jiles Mote, PA-C  07/04/2023, 9:55 AM

## 2023-07-04 NOTE — Evaluation (Signed)
 Physical Therapy Brief Evaluation and Discharge Note Patient Details Name: Jasmin [name] MRN: [id number] DOB: Sep 11, 1945 Today's Date: 07/04/2023   History of Present Illness  78 y.o. female presents to [facility] hospital on 07/03/2023 as a transfer [facility] for evaluation of heart block and DOE. PMH includes DMII, HTN, HLD, CVA, OSA, GERD.  Clinical Impression  Pt is very active at baseline, gardening and going to the gym regularly. Pt lives with husband who can assist her as needed 24/7. Pt is currently independent in mobility, however HR at rest in low 40s and with activity in low 60s. Pt has no PT needs acutely or at discharge and should follow cardiologist advice for returning to activity at the gym. PT referred pt to Mobility Specialist for longer distance mobilization in hospital. PT signing off.       PT Assessment Patient does not need any further PT services  Assistance Needed at Discharge  None (follow cardiologist guidelines for returning to gym)    Equipment Recommendations None recommended by PT     Precautions/Restrictions Precautions Precautions: Other (comment) Precaution/Restrictions Comments: HR 40 to 63 throughout. Restrictions Weight Bearing Restrictions Per Provider Order: No        Mobility  Bed Mobility  OOB on entry        Transfers Overall transfer level: Independent Equipment used: None               General transfer comment: sit to stand from straight back chair    Ambulation/Gait Ambulation/Gait assistance: Independent Gait Distance (Feet): 450 Feet Assistive device: None Gait Pattern/deviations: Step-through pattern, WFL(Within Functional Limits) Gait Speed: Pace WFL General Gait Details: WFL         Balance Overall balance assessment: Independent                        Pertinent Vitals/Pain PT - Brief Vital Signs All Vital Signs Stable: Other (comment) (max noted HR 62bpm with ambulation) Pain  Assessment Pain Assessment: No/denies pain     Home Living Family/patient expects to be discharged to:: Private residence Living Arrangements: Spouse/significant other Available Help at Discharge: Available 24 hours/day;Family     Home Equipment: Rolling Walker (2 wheels);BSC/3in1;Shower seat;Adaptive equipment Adaptive Equipment: Reacher Additional Comments: DME from [facility] and [facility]    Prior Function Level of Independence: Independent Comments: enjoys working in her garden and going to [employer]    UE/LE Assessment   UE ROM/Strength/Tone/Coordination: [employer]    LE ROM/Strength/Tone/Coordination: [facility]      Communication   Communication Communication: No apparent difficulties     Cognition Overall Cognitive Status: Appears within functional limits for tasks assessed/performed       General Comments General comments (skin integrity, edema, etc.): HR max noted with ambulation 65bpm        Assessment/Plan       PT Visit Diagnosis Other (comment) (decreased activity tolerance)    No Skilled PT All education completed;Patient will have necessary level of assist by caregiver at discharge;Patient is independent with all acitivity/mobility    AMPAC 6 Clicks Help needed turning from your back to your side while in a flat bed without using bedrails?: None Help needed moving from lying on your back to sitting on the side of a flat bed without using bedrails?: None Help needed moving to and from a bed to a chair (including a wheelchair)?: None Help needed standing up from a chair using your arms (e.g.,  wheelchair or bedside chair)?: None Help needed to walk in hospital room?: None Help needed climbing 3-5 steps with a railing? : None 6 Click Score: 24      End of Session   Activity Tolerance: Patient tolerated treatment well Patient left: in chair;with family/visitor present;Other (comment) (cardiac PA in room) Nurse Communication: Mobility status PT Visit Diagnosis:  Other (comment) (decreased activity tolerance)     Time: 1610-9604 PT Time Calculation (min) (ACUTE ONLY): 24 min  Charges:   PT Evaluation $PT Eval Low Complexity: 1 Low PT Treatments $Gait Training: 8-22 mins    Alicianna Litchford B. Jewel Mortimer PT, DPT Acute Rehabilitation Services Please use secure chat or  Call Office [phone]   Verlie Glisson [facility]  07/04/2023, 9:48 AM

## 2023-07-04 NOTE — Progress Notes (Signed)
 PROGRESS NOTE    Darely Novel  ZOX:[id number] DOB: March 23, 1945 DOA: 07/02/2023 PCP: [name], MD    Chief Complaint  Patient presents with   Shortness of Breath    Brief Narrative:  Patient 78 year old female history of type 2 diabetes, hypertension, sleep apnea presented to the ED with increasing dyspnea on exertion which she noticed after increased dose of Ozempic . Patient with dyspnea on minimal exertion. Patient seen in the ED noted to be bradycardic with a 2-1 AV block with prolonged PR up to 450 MS. It was noted that while ambulating in the ER patient's heart rate did go up into the 70s. Patient admitted for further evaluation. Cardiology consulted.    Assessment & Plan:   Principal Problem:   Bradycardia Active Problems:   Mixed hyperlipidemia   Essential hypertension   OSA on CPAP   Diabetes mellitus without complication (HCC)   UTI (urinary tract infection)   Symptomatic bradycardia   2nd degree AV block   CAD in native artery   Acute on chronic heart failure (HCC)   Morbid obesity (HCC)  1.  2:1 AV block -Patient presented with worsening dyspnea on exertion, noted to be bradycardic with 2:1 AV block with prolonged PR up to 450 ms. - High-sensitivity cardiac enzymes cycled which are negative. -TSH noted at 2.269. -BNP noted to be elevated at 2556 however patient does not look volume overloaded on examination. -Patient with Lasix 20 mg IV on presentation to the ED and received Lasix 40 mg IV x 1 in the morning on 07/03/2023 and IV Lasix subsequently discontinued.  -Continue aspirin , losartan , simvastatin . -Patient not on rate limiting medications. -2D echo with EF of 60 to 65%, NWMA, moderate asymmetric LVH of the basal septal segment.  Grade 2 DD.  Mildly dilated left atrial size.  Trivial MVR.  Mild AVR.  Moderate aortic valvular stenosis.  Aortic dilatation noted.   - Patient seen in consultation by cardiology and patient for LHC today.  2.  Moderate  aortic valve stenosis -Being followed by cardiology who are recommending outpatient follow-up.    3.  Hyperlipidemia -Fasting lipid panel with LDL of 64. - Continue simvastatin .    4.  Hypertension -Continue losartan .   5.  History of prior CVA -Continue aspirin  and Plavix  for secondary stroke prophylaxis. -Continue statin.   6.  OSA -CPAP nightly   7.  Well-controlled diabetes mellitus type 2 -Hemoglobin A1c 6.3 (04/27/2023) - Patient noted to be on metformin  prior to admission. - Continue SSI.   8.  E. coli UTI -Urinalysis concerning for UTI. - Urine cultures > 100,000 colonies of E. coli with sensitivities pending.   - Continue IV Rocephin.      DVT prophylaxis: Lovenox Code Status: Full Family Communication: Updated patient and husband at bedside. Disposition: Likely home when clinically improved and cleared by cardiology.  Status is: Inpatient The patient will require care spanning > 2 midnights and should be moved to inpatient because: Severity of illness   Consultants:  Cardiology: Dr. Theodis Fiscal  Procedures:  Chest x-ray 07/02/2023 2D echo 07/03/2023 Cardiac catheterization pending  Antimicrobials:  Anti-infectives (From admission, onward)    Start     Dose/Rate Route Frequency Ordered Stop   07/04/23 0400  [MAR Hold]  cefTRIAXone (ROCEPHIN) 2 g in sodium chloride 0.9 % 100 mL IVPB        (MAR Hold since Tue 07/04/2023 at 1337.Hold Reason: Transfer to a Procedural area)   2 g 200 mL/hr over 30  Minutes Intravenous Every 24 hours 07/03/23 0745     07/03/23 0415  cefTRIAXone (ROCEPHIN) 1 g in sodium chloride 0.9 % 100 mL IVPB        1 g 200 mL/hr over 30 Minutes Intravenous  Once 07/03/23 0413 07/03/23 0455         Subjective: Patient sitting up in chair watching television.  Patient denies any chest pain, no shortness of breath, no abdominal pain.  Stated ambulated in hallway without any significant shortness of breath.  Awaiting cardiac catheterization.    Objective: Vitals:   07/04/23 0430 07/04/23 0830 07/04/23 1204 07/04/23 1412  BP: (!) 152/48 (!) 128/46 (!) 125/42   Pulse: (!) 47 (!) 42 (!) 51   Resp: 19 18    Temp: 98 F (36.7 C) 97.8 F (36.6 C) 97.7 F (36.5 C)   TempSrc: Oral Oral Oral   SpO2: 98% 99% 99% 99%  Weight: 108 kg     Height:        Intake/Output Summary (Last 24 hours) at 07/04/2023 1606 Last data filed at 07/04/2023 1000 Gross per 24 hour  Intake 1009.39 ml  Output --  Net 1009.39 ml   Filed Weights   07/02/23 1809 07/04/23 0430  Weight: 110.9 kg 108 kg    Examination:  General exam: Appears calm and comfortable  Respiratory system: Clear to auscultation.  No wheezes, no crackles, no rhonchi.  Fair air movement.  Speaking in full sentences.  Respiratory effort normal. Cardiovascular system: S1 & S2 heard, RRR. No JVD, murmurs, rubs, gallops or clicks. No pedal edema. Gastrointestinal system: Abdomen is nondistended, soft and nontender. No organomegaly or masses felt. Normal bowel sounds heard. Central nervous system: Alert and oriented. No focal neurological deficits. Extremities: Symmetric 5 x 5 power. Skin: No rashes, lesions or ulcers Psychiatry: Judgement and insight appear normal. Mood & affect appropriate.     Data Reviewed: I have personally reviewed following labs and imaging studies  CBC: Recent Labs  Lab 07/02/23 1812 07/03/23 0748 07/04/23 0427  WBC 11.3* 11.0* 9.4  NEUTROABS 7.6 9.0* 6.6  HGB 12.3 12.4 11.9*  HCT 37.4 39.1 36.1  MCV 87.2 91.4 87.0  PLT 305 276 273    Basic Metabolic Panel: Recent Labs  Lab 07/02/23 1812 07/03/23 0157 07/03/23 0748 07/04/23 0427  NA 137  --  139 139  K 4.0  --  3.9 4.2  CL 100  --  105 103  CO2 23  --  21* 25  GLUCOSE 174*  --  136* 134*  BUN 18  --  13 22  CREATININE 0.75  --  0.62 0.81  CALCIUM  9.4  --  9.0 9.2  MG  --  2.0  --  2.0    GFR: Estimated Creatinine Clearance: 65 mL/min (by C-G formula based on SCr of 0.81  mg/dL).  Liver Function Tests: Recent Labs  Lab 07/02/23 1812  AST 24  ALT 21  ALKPHOS 63  BILITOT 0.5  PROT 6.7  ALBUMIN 4.1    CBG: Recent Labs  Lab 07/03/23 1150 07/03/23 1848 07/03/23 2207 07/04/23 0834 07/04/23 1202  GLUCAP 115* 132* 145* 129* 106*     Recent Results (from the past 240 hours)  Resp panel by RT-PCR (RSV, Flu A&B, Covid) Anterior Nasal Swab     Status: None   Collection Time: 07/03/23 12:27 AM   Specimen: Anterior Nasal Swab  Result Value Ref Range Status   SARS Coronavirus 2 by RT PCR NEGATIVE NEGATIVE  Final    Comment: (NOTE) SARS-CoV-2 target nucleic acids are NOT DETECTED.  The SARS-CoV-2 RNA is generally detectable in upper respiratory specimens during the acute phase of infection. The lowest concentration of SARS-CoV-2 viral copies this assay can detect is 138 copies/mL. A negative result does not preclude SARS-Cov-2 infection and should not be used as the sole basis for treatment or other patient management decisions. A negative result may occur with  improper specimen collection/handling, submission of specimen other than nasopharyngeal swab, presence of viral mutation(s) within the areas targeted by this assay, and inadequate number of viral copies(<138 copies/mL). A negative result must be combined with clinical observations, patient history, and epidemiological information. The expected result is Negative.  Fact Sheet for Patients:  BloggerCourse.com  Fact Sheet for Healthcare Providers:  SeriousBroker.it  This test is no t yet approved or cleared by the [place]  FDA and  has been authorized for detection and/or diagnosis of SARS-CoV-2 by FDA under an Emergency Use Authorization (EUA). This EUA will remain  in effect (meaning this test can be used) for the duration of the COVID-19 declaration under Section 564(b)(1) of the Act, 21 U.S.C.section 360bbb-3(b)(1), unless the  authorization is terminated  or revoked sooner.       Influenza A by PCR NEGATIVE NEGATIVE Final   Influenza B by PCR NEGATIVE NEGATIVE Final    Comment: (NOTE) The Xpert Xpress SARS-CoV-2/FLU/RSV plus assay is intended as an aid in the diagnosis of influenza from Nasopharyngeal swab specimens and should not be used as a sole basis for treatment. Nasal washings and aspirates are unacceptable for Xpert Xpress SARS-CoV-2/FLU/RSV testing.  Fact Sheet for Patients: BloggerCourse.com  Fact Sheet for Healthcare Providers: SeriousBroker.it  This test is not yet approved or cleared by the [place]  FDA and has been authorized for detection and/or diagnosis of SARS-CoV-2 by FDA under an Emergency Use Authorization (EUA). This EUA will remain in effect (meaning this test can be used) for the duration of the COVID-19 declaration under Section 564(b)(1) of the Act, 21 U.S.C. section 360bbb-3(b)(1), unless the authorization is terminated or revoked.     Resp Syncytial Virus by PCR NEGATIVE NEGATIVE Final    Comment: (NOTE) Fact Sheet for Patients: BloggerCourse.com  Fact Sheet for Healthcare Providers: SeriousBroker.it  This test is not yet approved or cleared by the [place]  FDA and has been authorized for detection and/or diagnosis of SARS-CoV-2 by FDA under an Emergency Use Authorization (EUA). This EUA will remain in effect (meaning this test can be used) for the duration of the COVID-19 declaration under Section 564(b)(1) of the Act, 21 U.S.C. section 360bbb-3(b)(1), unless the authorization is terminated or revoked.  Performed at [facility], [address] [place], [place] [address]   Urine Culture     Status: Abnormal (Preliminary result)   Collection Time: 07/03/23  1:35 AM   Specimen: Urine, Clean Catch  Result Value Ref Range Status   Specimen  Description   Final    URINE, CLEAN CATCH Performed at [facility], [address] [place], [place] [address]    Special Requests   Final    NONE Performed at [facility], [address] [place], [place] [address]    Culture >=100,000 COLONIES/mL ESCHERICHIA COLI (A)  Final   Report Status PENDING  Incomplete         Radiology Studies: ECHOCARDIOGRAM COMPLETE Result Date: 07/03/2023    ECHOCARDIOGRAM REPORT   Patient Name:  Semone Costanza Date of Exam: 07/03/2023 Medical Rec #:  [id number]       Height:       61.0 in Accession #:    [id number]      Weight:       244.4 lb Date of Birth:  09-17-1945       BSA:          2.057 m Patient Age:    78 years        BP:           118/75 mmHg Patient Gender: F               HR:           59 bpm. Exam Location:  Inpatient Procedure: 2D Echo, Cardiac Doppler and Color Doppler (Both Spectral and Color            Flow Doppler were utilized during procedure). Indications:    Fatigue, heart block, shortness of breath  History:        Patient has prior history of Echocardiogram examinations.  Sonographer:    Janette Medley Referring Phys: [id number] Suzzette Gasparro IMPRESSIONS  1. Left ventricular ejection fraction, by estimation, is 60 to 65%. The left ventricle has normal function. The left ventricle has no regional wall motion abnormalities. There is moderate asymmetric left ventricular hypertrophy of the basal-septal segment. Left ventricular diastolic parameters are consistent with Grade II diastolic dysfunction (pseudonormalization).  2. Right ventricular systolic function is normal. The right ventricular size is normal. Tricuspid regurgitation signal is inadequate for assessing PA pressure.  3. Left atrial size was mildly dilated.  4. The mitral valve is degenerative. Trivial mitral valve regurgitation. No evidence of mitral stenosis. Moderate mitral annular calcification.  5. The aortic valve is tricuspid. There is moderate calcification  of the aortic valve. Aortic valve regurgitation is mild. Moderate aortic valve stenosis. Aortic valve mean gradient measures 23.0 mmHg, AVA 1.38 cm^2.  6. Aortic dilatation noted. There is mild dilatation of the ascending aorta, measuring 40 mm.  7. The inferior vena cava is dilated in size with >50% respiratory variability, suggesting right atrial pressure of 8 mmHg. FINDINGS  Left Ventricle: Left ventricular ejection fraction, by estimation, is 60 to 65%. The left ventricle has normal function. The left ventricle has no regional wall motion abnormalities. The left ventricular internal cavity size was normal in size. There is  moderate asymmetric left ventricular hypertrophy of the basal-septal segment. Left ventricular diastolic parameters are consistent with Grade II diastolic dysfunction (pseudonormalization). Right Ventricle: The right ventricular size is normal. No increase in right ventricular wall thickness. Right ventricular systolic function is normal. Tricuspid regurgitation signal is inadequate for assessing PA pressure. Left Atrium: Left atrial size was mildly dilated. Right Atrium: Right atrial size was normal in size. Pericardium: There is no evidence of pericardial effusion. Mitral Valve: The mitral valve is degenerative in appearance. There is mild calcification of the mitral valve leaflet(s). Moderate mitral annular calcification. Trivial mitral valve regurgitation. No evidence of mitral valve stenosis. Tricuspid Valve: The tricuspid valve is normal in structure. Tricuspid valve regurgitation is not demonstrated. Aortic Valve: The aortic valve is tricuspid. There is moderate calcification of the aortic valve. Aortic valve regurgitation is mild. Moderate aortic stenosis is present. Aortic valve mean gradient measures 23.0 mmHg. Aortic valve peak gradient measures 22.7 mmHg. Aortic valve area, by VTI measures 1.99 cm. Pulmonic Valve: The pulmonic valve was normal in structure. Pulmonic valve  regurgitation is not visualized.  Aorta: The aortic root is normal in size and structure and aortic dilatation noted. There is mild dilatation of the ascending aorta, measuring 40 mm. Venous: The inferior vena cava is dilated in size with greater than 50% respiratory variability, suggesting right atrial pressure of 8 mmHg. IAS/Shunts: No atrial level shunt detected by color flow Doppler.  LEFT VENTRICLE PLAX 2D LVIDd:         4.60 cm   Diastology LVIDs:         3.20 cm   LV e' lateral: 12.80 cm/s LV PW:         1.30 cm LV IVS:        1.50 cm LVOT diam:     2.00 cm LV SV:         108 LV SV Index:   53 LVOT Area:     3.14 cm  RIGHT VENTRICLE             IVC RV S prime:     17.80 cm/s  IVC diam: 2.40 cm TAPSE (M-mode): 3.1 cm LEFT ATRIUM              Index        RIGHT ATRIUM           Index LA diam:        3.10 cm  1.51 cm/m   RA Area:     17.30 cm LA Vol (A2C):   114.0 ml 55.43 ml/m  RA Volume:   46.00 ml  22.37 ml/m LA Vol (A4C):   37.3 ml  18.14 ml/m LA Biplane Vol: 70.0 ml  34.03 ml/m  AORTIC VALVE AV Area (Vmax):    1.73 cm AV Area (Vmean):   1.61 cm AV Area (VTI):     1.99 cm AV Vmax:           238.00 cm/s AV Vmean:          164.500 cm/s AV VTI:            0.542 m AV Peak Grad:      22.7 mmHg AV Mean Grad:      23.0 mmHg LVOT Vmax:         131.00 cm/s LVOT Vmean:        84.100 cm/s LVOT VTI:          0.344 m LVOT/AV VTI ratio: 0.64  AORTA Ao Root diam: 2.80 cm Ao Asc diam:  4.00 cm  SHUNTS Systemic VTI:  0.34 m Systemic Diam: 2.00 cm Dalton McleanMD Electronically signed by Archer Bear Signature Date/Time: 07/03/2023/6:38:42 PM    Final    DG Chest 2 View Result Date: 07/02/2023 CLINICAL DATA:  Chest pain, SOB. EXAM: CHEST - 2 VIEW COMPARISON:  01/17/2022 FINDINGS: The heart size and mediastinal contours are within normal limits. No pneumothorax or pleural effusion. Lower lung interstitial prominence consistent with scarring or subsegmental atelectasis. Calcified aorta. Thoracic degenerative  changes. IMPRESSION: Bibasilar subsegmental atelectasis or scarring. Electronically Signed   By: Sydell Eva M.D.   On: 07/02/2023 20:26        Scheduled Meds:  [MAR Hold] aspirin  EC  81 mg Oral Daily   [MAR Hold] clopidogrel   75 mg Oral Daily   [MAR Hold] cycloSPORINE   1 drop Both Eyes BID   [MAR Hold] desmopressin   0.2 mg Oral QHS   [MAR Hold] enoxaparin (LOVENOX) injection  40 mg Subcutaneous Q24H   [MAR Hold] fesoterodine  4 mg Oral Daily   [MAR  Hold] fluticasone   2 spray Each Nare Daily   [MAR Hold] insulin  aspart  0-9 Units Subcutaneous TID WC   [MAR Hold] losartan   75 mg Oral Daily   [MAR Hold] mirabegron  ER  50 mg Oral Daily   [MAR Hold] omega-3 acid ethyl esters  2 g Oral BID   [MAR Hold] pantoprazole   40 mg Oral BID   [MAR Hold] simvastatin   20 mg Oral QHS   Continuous Infusions:  sodium chloride 10 mL/hr (07/04/23 1533)   sodium chloride Stopped (07/04/23 1257)   [MAR Hold] cefTRIAXone (ROCEPHIN)  IV 2 g (07/04/23 0516)     LOS: 1 day    Time spent: 40 minutes    Hilda Lovings, MD [facility]   To contact the attending provider between 7A-7P or the covering provider during after hours 7P-7A, please log into the web site [email or web address] and access using universal [place] password for that web site. If you do not have the password, please call the hospital operator.  07/04/2023, 4:06 PM

## 2023-07-04 NOTE — Brief Op Note (Addendum)
 07/04/2023  4:41 PM  Primary Care Provider: [name], MD [facility] Cardiologist: Ralene Burger, MD  Referring Cardiologist: Dr. Maudine Sos  SURGEON:  Surgeons and Role:    * Arleen Lacer, MD - Primary  PROCEDURE:  Procedure(s): RIGHT/LEFT HEART CATH AND CORONARY ANGIOGRAPHY (N/A)  PATIENT:  Jasmin [name]  78 y.o. female  with diabetes, hypertension, hyperlipidemia, prior stroke, OSA on CPAP, and GERD admitted with fatigue and found to have 2:1 second-degree AV block.   PRE-OPERATIVE DIAGNOSIS:  chest pain / near syncope  POST-OPERATIVE DIAGNOSIS:   Angiographically normal coronary arteries with a codominant system Very tortuous innominate artery making it very difficult to engage the RCA-nonselective images obtained. Relatively Normal Right Heart Cath Numbers: RAP mean 8 mmHg; RV P-EDP 37/0-13 mmHg; PAP-mean 38/21-22 mmHg with a PCWP of 24 mmHg. AO P-MAP 130/39-65 mmHg; LV P-EDP 164/13-19 mmHg. Ao sat 93%, PA sat 63%.  Cardiac output-index That (Fick) 5.57-2.74. ~Moderate aortic stenosis with mean AVG estimated 21 mmHg  PROCEDURE PERFORMED Time Out: Verified patient identification, verified procedure, site/side was marked, verified correct patient position, special equipment/implants available, medications/allergies/relevent history reviewed, required imaging and test results available. Performed.  Access:  Right Radial Artery: 6 Fr sheath -- Seldinger technique using glide sheath micropuncture Kit Direct ultrasound guidance used.  Permanent image obtained and placed on chart. 10 mL radial cocktail IA; 5000 Units IV Heparin  * Right Brachial/Antecubital Vein:  5Fr sheath sheath-Seldinger technique glide sheath micropuncture kit Direct ultrasound guidance used.  Permanent image obtained and placed on chart.  Initially, the existing antecubital IV was exchanged for the 5 French sheath but I was not able to advance the catheter more than 10 mm  beyond the sheath therefore this was aborted and new access was obtained  Right Heart Catheterization: 5 Fr Harlo Ligas catheter advanced under fluoroscopy with balloon inflated to the RA, RV, then PCWP-PA for hemodynamic measurement.  * Simultaneous FA & PA blood gases checked for SaO2% to calculate FICK CO/CI  * Catheter removed completely out of the body with balloon deflated.  Left Heart Catheterization: 5 Fr Catheters advanced or exchanged over a J-wire under direct fluoroscopic guidance into the ascending aorta; TIG 4.0 catheter advanced first.  * LV Hemodynamics (no LV Gram): JR4 catheter * Left Coronary Artery Cineangiography: TIG 4.0 catheter  * Right Coronary Artery Cineangiography (semi-selective): No Torque guide catheter    Upon completion of Angiogaphy, the catheter was removed completely out of the body over a wire, without complication.  Brachial Sheath(s) removed in the Cath Lab, exchanged over wire using Seldinger technique for 14-gauge IV   Radial sheath removed in the Cardiac Catheterization lab with TR Band placed for hemostasis. TR Band: 1615 Hours; 14 mL air: Reverse Barbeau C  MEDICATIONS * SQ Lidocaine 3mL * Radial Cocktail: 3 mg Verapmil in 10 mL NS * Isovue Contrast: 110 mL * Heparin: 5 units   EBL:  < 50 mL  PATIENT DISPOSITION:  PACU - hemodynamically stable.  DICTATION: .Note written in EPIC  PLAN OF CARE: Return to nursing unit for ongoing care and continued management.   Randene Bustard, MD

## 2023-07-05 DIAGNOSIS — R001 Bradycardia, unspecified: Secondary | ICD-10-CM | POA: Diagnosis not present

## 2023-07-05 DIAGNOSIS — I441 Atrioventricular block, second degree: Secondary | ICD-10-CM | POA: Diagnosis not present

## 2023-07-05 DIAGNOSIS — I2510 Atherosclerotic heart disease of native coronary artery without angina pectoris: Secondary | ICD-10-CM | POA: Diagnosis not present

## 2023-07-05 LAB — CBC
HCT: 34 % — ABNORMAL LOW (ref 36.0–46.0)
Hemoglobin: 10.9 g/dL — ABNORMAL LOW (ref 12.0–15.0)
MCH: 28.2 pg (ref 26.0–34.0)
MCHC: 32.1 g/dL (ref 30.0–36.0)
MCV: 88.1 fL (ref 80.0–100.0)
Platelets: 280 10*3/uL (ref 150–400)
RBC: 3.86 MIL/uL — ABNORMAL LOW (ref 3.87–5.11)
RDW: 15.1 % (ref 11.5–15.5)
WBC: 7.6 10*3/uL (ref 4.0–10.5)
nRBC: 0 % (ref 0.0–0.2)

## 2023-07-05 LAB — GLUCOSE, CAPILLARY
Glucose-Capillary: 165 mg/dL — ABNORMAL HIGH (ref 70–99)
Glucose-Capillary: 109 mg/dL — ABNORMAL HIGH (ref 70–99)
Glucose-Capillary: 135 mg/dL — ABNORMAL HIGH (ref 70–99)
Glucose-Capillary: 119 mg/dL — ABNORMAL HIGH (ref 70–99)

## 2023-07-05 LAB — BASIC METABOLIC PANEL WITH GFR
Anion gap: 9 (ref 5–15)
BUN: 19 mg/dL (ref 8–23)
CO2: 23 mmol/L (ref 22–32)
Calcium: 8.3 mg/dL — ABNORMAL LOW (ref 8.9–10.3)
Chloride: 107 mmol/L (ref 98–111)
Creatinine, Ser: 0.76 mg/dL (ref 0.44–1.00)
GFR, Estimated: >60 mL/min (ref >60)
Glucose, Bld: 138 mg/dL — ABNORMAL HIGH (ref 70–99)
Potassium: 3.8 mmol/L (ref 3.5–5.1)
Sodium: 139 mmol/L (ref 135–145)

## 2023-07-05 LAB — MAGNESIUM: Magnesium: 2 mg/dL (ref 1.7–2.4)

## 2023-07-05 LAB — URINE CULTURE: Culture: >=100000 — AB

## 2023-07-05 LAB — SURGICAL PCR SCREEN
MRSA, PCR: NEGATIVE
Staphylococcus aureus: NEGATIVE

## 2023-07-05 MED ORDER — FUROSEMIDE 20 MG PO TABS
20.0000 mg | ORAL_TABLET | Freq: Every day | ORAL | Status: DC
  Administered 2023-07-06 – 2023-07-08 (×3): 20 mg via ORAL
  Filled 2023-07-05 (×3): qty 1

## 2023-07-05 MED ORDER — CEFADROXIL 500 MG PO CAPS
500.0000 mg | ORAL_CAPSULE | Freq: Two times a day (BID) | ORAL | Status: DC
  Administered 2023-07-05 – 2023-07-08 (×6): 500 mg via ORAL
  Filled 2023-07-05 (×6): qty 1

## 2023-07-05 NOTE — Progress Notes (Signed)
 Mobility Specialist Progress Note;   07/05/23 1008  Mobility  Activity Ambulated independently in hallway  Level of Assistance Standby assist, set-up cues, supervision of patient - no hands on  Assistive Device None  Distance Ambulated (ft) 400 ft  Activity Response Tolerated well  Mobility Referral Yes  Mobility visit 1 Mobility  Mobility Specialist Start Time (ACUTE ONLY) 1008  Mobility Specialist Stop Time (ACUTE ONLY) 1013  Mobility Specialist Time Calculation (min) (ACUTE ONLY) 5 min    Pre-mobility: HR 40 bpm During-mobility: HR up to 77 bpm Post-mobility: HR 60 bpm  Pt eager for mobility. Required no physical assistance during ambulation, SV. HR up to 77 bpm w/ activity. C/o feeling winded, SPO2 stable. Pt returned back to chair with all needs met. RN in room.   Jasmin [name] Mobility Specialist Please contact via SecureChat or [employer] [phone]

## 2023-07-05 NOTE — Plan of Care (Signed)

## 2023-07-05 NOTE — Progress Notes (Signed)
 PROGRESS NOTE    Jasmin [name]  YQM:[id number] DOB: 1945-12-15 DOA: 07/02/2023 PCP: [name], MD  Chief Complaint  Patient presents with   Shortness of Breath    Brief Narrative:   Patient 78 year old female history of type 2 diabetes, hypertension, sleep apnea presented to the ED with increasing dyspnea on exertion which she noticed after increased dose of Ozempic . Patient with dyspnea on minimal exertion. Patient seen in the ED noted to be bradycardic with [name] 2-1 AV block with prolonged PR up to 450 MS. It was noted that while ambulating in the ER patient's heart rate did go up into the 70s. Patient admitted for further evaluation. Cardiology consulted.   Assessment & Plan:   Principal Problem:   Bradycardia Active Problems:   Mixed hyperlipidemia   Essential hypertension   OSA on CPAP   Diabetes mellitus without complication (HCC)   UTI (urinary tract infection)   Symptomatic bradycardia   2nd degree AV block   CAD in native artery   Acute on chronic heart failure (HCC)   Morbid obesity (HCC)  2:1 AV block Symptomatic Bradycardia Appreciate cards assistance S/p L/RHC with angiographically normal coronary arteries - consider nonischemic etiology for patient's symptoms, eval bradycardia Appreciate EP eval, planning for PPM   Aortic Valve Stenosis Cards follow up  Dyslipidemia Simvastatin   Hypertension losartan  Hx CVA Aspirin , plavix  (currently on hold) OSA Nightly CPAP  T2DM SSI On metformin  prior to admission  E. Coli UTI Narrow to duricef     DVT prophylaxis: SCD Code Status: full Family Communication: husband Disposition:   Status is: Inpatient Remains inpatient appropriate because: cards consult   Consultants:  cardiology  Procedures:  L/RHC POST-OPERATIVE DIAGNOSIS:   Angiographically normal coronary arteries with [name] codominant system Very tortuous innominate artery making it very difficult to engage the RCA-nonselective images  obtained. Relatively Normal Right Heart Cath Numbers: RAP mean 8 mmHg; RV P-EDP 37/0-13 mmHg; PAP-mean 38/21-22 mmHg with [name] PCWP of 24 mmHg. AO P-MAP 130/39-65 mmHg; LV P-EDP 164/13-19 mmHg. Ao sat 93%, PA sat 63%.  Cardiac output-index That (Fick) 5.57-2.74. ~Moderate aortic stenosis with mean AVG estimated 21 mmHg   PLAN OF CARE: Return to nursing unit for ongoing care and continued management.  .  Anticipated discharge date to be determined.   Consider nonischemic etiology for the patient's symptoms. Likely need to evaluate bradycardia.  Echo IMPRESSIONS     1. Left ventricular ejection fraction, by estimation, is 60 to 65%. The  left ventricle has normal function. The left ventricle has no regional  wall motion abnormalities. There is moderate asymmetric left ventricular  hypertrophy of the basal-septal  segment. Left ventricular diastolic parameters are consistent with Grade  II diastolic dysfunction (pseudonormalization).   2. Right ventricular systolic function is normal. The right ventricular  size is normal. Tricuspid regurgitation signal is inadequate for assessing  PA pressure.   3. Left atrial size was mildly dilated.   4. The mitral valve is degenerative. Trivial mitral valve regurgitation.  No evidence of mitral stenosis. Moderate mitral annular calcification.   5. The aortic valve is tricuspid. There is moderate calcification of the  aortic valve. Aortic valve regurgitation is mild. Moderate aortic valve  stenosis. Aortic valve mean gradient measures 23.0 mmHg, AVA 1.38 cm^2.   6. Aortic dilatation noted. There is mild dilatation of the ascending  aorta, measuring 40 mm.   7. The inferior vena cava is dilated in size with >50% respiratory  variability, suggesting right atrial pressure  of 8 mmHg.   Antimicrobials:  Anti-infectives (From admission, onward)    Start     Dose/Rate Route Frequency Ordered Stop   07/04/23 0400  cefTRIAXone (ROCEPHIN) 2 g in sodium  chloride 0.9 % 100 mL IVPB        2 g 200 mL/hr over 30 Minutes Intravenous Every 24 hours 07/03/23 0745     07/03/23 0415  cefTRIAXone (ROCEPHIN) 1 g in sodium chloride 0.9 % 100 mL IVPB        1 g 200 mL/hr over 30 Minutes Intravenous  Once 07/03/23 0413 07/03/23 0455       Subjective: No complaints Husband at bedside, upset regarding delay  Objective: Vitals:   07/05/23 0435 07/05/23 0756 07/05/23 1146 07/05/23 1729  BP: (!) 134/42 (!) 120/37 (!) 120/56 (!) 148/59  Pulse: (!) 50 (!) 46 (!) 43 81  Resp: 20 19 18 18   Temp:  98.2 F (36.8 C) 97.9 F (36.6 C) 98.3 F (36.8 C)  TempSrc:  Oral Oral Oral  SpO2: 97%     Weight: 109.7 kg     Height:        Intake/Output Summary (Last 24 hours) at 07/05/2023 2029 Last data filed at 07/05/2023 1610 Gross per 24 hour  Intake --  Output 200 ml  Net -200 ml   Filed Weights   07/02/23 1809 07/04/23 0430 07/05/23 0435  Weight: 110.9 kg 108 kg 109.7 kg    Examination:  General exam: Appears calm and comfortable  Respiratory system: unlabored Cardiovascular system: RRR Gastrointestinal system: Abdomen is nondistended, soft and nontender. Central nervous system: Alert and oriented. No focal neurological deficits. Extremities: no LEE    Data Reviewed: I have personally reviewed following labs and imaging studies  CBC: Recent Labs  Lab 07/02/23 1812 07/03/23 0748 07/04/23 0427 07/04/23 1434 07/04/23 1526 07/04/23 1531 07/04/23 2028 07/05/23 0437  WBC 11.3* 11.0* 9.4  --   --   --  9.1 7.6  NEUTROABS 7.6 9.0* 6.6  --   --   --   --   --   HGB 12.3 12.4 11.9* 11.9* 11.9*  11.9* 12.2 12.9 10.9*  HCT 37.4 39.1 36.1 35.0* 35.0*  35.0* 36.0 39.6 34.0*  MCV 87.2 91.4 87.0  --   --   --  87.8 88.1  PLT 305 276 273  --   --   --  304 280    Basic Metabolic Panel: Recent Labs  Lab 07/02/23 1812 07/03/23 0157 07/03/23 0748 07/04/23 0427 07/04/23 1434 07/04/23 1526 07/04/23 1531 07/04/23 2028 07/05/23 0437  NA  137  --  139 139 140 141  141 140  --  139  K 4.0  --  3.9 4.2 3.8 3.8  3.8 3.8  --  3.8  CL 100  --  105 103  --   --   --   --  107  CO2 23  --  21* 25  --   --   --   --  23  GLUCOSE 174*  --  136* 134*  --   --   --   --  138*  BUN 18  --  13 22  --   --   --   --  19  CREATININE 0.75  --  0.62 0.81  --   --   --  0.77 0.76  CALCIUM  9.4  --  9.0 9.2  --   --   --   --  8.3*  MG  --  2.0  --  2.0  --   --   --   --  2.0    GFR: Estimated Creatinine Clearance: 66.4 mL/min (by C-G formula based on SCr of 0.76 mg/dL).  Liver Function Tests: Recent Labs  Lab 07/02/23 1812  AST 24  ALT 21  ALKPHOS 63  BILITOT 0.5  PROT 6.7  ALBUMIN 4.1    CBG: Recent Labs  Lab 07/04/23 1631 07/04/23 2203 07/05/23 0800 07/05/23 1149 07/05/23 1627  GLUCAP 96 143* 165* 109* 135*     Recent Results (from the past 240 hours)  Resp panel by RT-PCR (RSV, Flu Nikola Marone&B, Covid) Anterior Nasal Swab     Status: None   Collection Time: 07/03/23 12:27 AM   Specimen: Anterior Nasal Swab  Result Value Ref Range Status   SARS Coronavirus 2 by RT PCR NEGATIVE NEGATIVE Final    Comment: (NOTE) SARS-CoV-2 target nucleic acids are NOT DETECTED.  The SARS-CoV-2 RNA is generally detectable in upper respiratory specimens during the acute phase of infection. The lowest concentration of SARS-CoV-2 viral copies this assay can detect is 138 copies/mL. Trystin Hargrove negative result does not preclude SARS-Cov-2 infection and should not be used as the sole basis for treatment or other patient management decisions. Tevita Gomer negative result may occur with  improper specimen collection/handling, submission of specimen other than nasopharyngeal swab, presence of viral mutation(s) within the areas targeted by this assay, and inadequate number of viral copies(<138 copies/mL). Jaxen Samples negative result must be combined with clinical observations, patient history, and epidemiological information. The expected result is Negative.  Fact Sheet  for Patients:  BloggerCourse.com  Fact Sheet for Healthcare Providers:  SeriousBroker.it  This test is no t yet approved or cleared by the [place]  FDA and  has been authorized for detection and/or diagnosis of SARS-CoV-2 by FDA under an Emergency Use Authorization (EUA). This EUA will remain  in effect (meaning this test can be used) for the duration of the COVID-19 declaration under Section 564(b)(1) of the Act, 21 U.S.C.section 360bbb-3(b)(1), unless the authorization is terminated  or revoked sooner.       Influenza Demareon Coldwell by PCR NEGATIVE NEGATIVE Final   Influenza B by PCR NEGATIVE NEGATIVE Final    Comment: (NOTE) The Xpert Xpress SARS-CoV-2/FLU/RSV plus assay is intended as an aid in the diagnosis of influenza from Nasopharyngeal swab specimens and should not be used as Coleen Cardiff sole basis for treatment. Nasal washings and aspirates are unacceptable for Xpert Xpress SARS-CoV-2/FLU/RSV testing.  Fact Sheet for Patients: BloggerCourse.com  Fact Sheet for Healthcare Providers: SeriousBroker.it  This test is not yet approved or cleared by the [place]  FDA and has been authorized for detection and/or diagnosis of SARS-CoV-2 by FDA under an Emergency Use Authorization (EUA). This EUA will remain in effect (meaning this test can be used) for the duration of the COVID-19 declaration under Section 564(b)(1) of the Act, 21 U.S.C. section 360bbb-3(b)(1), unless the authorization is terminated or revoked.     Resp Syncytial Virus by PCR NEGATIVE NEGATIVE Final    Comment: (NOTE) Fact Sheet for Patients: BloggerCourse.com  Fact Sheet for Healthcare Providers: SeriousBroker.it  This test is not yet approved or cleared by the [place]  FDA and has been authorized for detection and/or diagnosis of SARS-CoV-2 by FDA under an  Emergency Use Authorization (EUA). This EUA will remain in effect (meaning this test can be used) for the duration of the COVID-19 declaration under Section 564(b)(1) of the Act,  21 U.S.C. section 360bbb-3(b)(1), unless the authorization is terminated or revoked.  Performed at [facility], [address] [place], [place] [address]   Urine Culture     Status: Abnormal   Collection Time: 07/03/23  1:35 AM   Specimen: Urine, Clean Catch  Result Value Ref Range Status   Specimen Description   Final    URINE, CLEAN CATCH Performed at [facility], [address] [place], [place] [address]    Special Requests   Final    NONE Performed at [facility], [address] [place], [place] [address]    Culture >=100,000 COLONIES/mL ESCHERICHIA COLI (Kember Boch)  Final   Report Status 07/05/2023 FINAL  Final   Organism ID, Bacteria ESCHERICHIA COLI (Jalonda Antigua)  Final      Susceptibility   Escherichia coli - MIC*    AMPICILLIN >=32 RESISTANT Resistant     CEFAZOLIN  <=4 SENSITIVE Sensitive     CEFEPIME <=0.12 SENSITIVE Sensitive     CEFTRIAXONE <=0.25 SENSITIVE Sensitive     CIPROFLOXACIN <=0.25 SENSITIVE Sensitive     GENTAMICIN <=1 SENSITIVE Sensitive     IMIPENEM 1 SENSITIVE Sensitive     NITROFURANTOIN  <=16 SENSITIVE Sensitive     TRIMETH /SULFA  <=20 SENSITIVE Sensitive     AMPICILLIN/SULBACTAM 16 INTERMEDIATE Intermediate     PIP/TAZO <=4 SENSITIVE Sensitive ug/mL    * >=100,000 COLONIES/mL ESCHERICHIA COLI  Surgical PCR screen     Status: None   Collection Time: 07/05/23  3:14 PM   Specimen: Nasal Mucosa; Nasal Swab  Result Value Ref Range Status   MRSA, PCR NEGATIVE NEGATIVE Final   Staphylococcus aureus NEGATIVE NEGATIVE Final    Comment: (NOTE) The Xpert SA Assay (FDA approved for NASAL specimens in patients 101 years of age and older), is one component of Nisreen Guise comprehensive surveillance program. It is not intended to diagnose infection nor to guide or monitor  treatment. Performed at [facility], [address] [place], [place] [address]          Radiology Studies: CARDIAC CATHETERIZATION Result Date: 07/04/2023 Images from the original result were not included. Dominance: Right POST-OPERATIVE DIAGNOSIS:  Angiographically normal coronary arteries with Josue Falconi codominant system Very tortuous innominate artery making it very difficult to engage the RCA-nonselective images obtained. Relatively Normal Right Heart Cath Numbers: RAP mean 8 mmHg; RV P-EDP 37/0-13 mmHg; PAP-mean 38/21-22 mmHg with Taygan Connell PCWP of 24 mmHg. AO P-MAP 130/39-65 mmHg; LV P-EDP 164/13-19 mmHg. Ao sat 93%, PA sat 63%.  Cardiac output-index That (Fick) 5.57-2.74. ~Moderate aortic stenosis with mean AVG estimated 21 mmHg PLAN OF CARE: Return to nursing unit for ongoing care and continued management. .  Anticipated discharge date to be determined.   Consider nonischemic etiology for the patient's symptoms. Likely need to evaluate bradycardia. Randene Bustard, MD       Scheduled Meds:  aspirin  EC  81 mg Oral Daily   cycloSPORINE   1 drop Both Eyes BID   desmopressin   0.2 mg Oral QHS   fesoterodine  4 mg Oral Daily   fluticasone   2 spray Each Nare Daily   [START ON 07/06/2023] furosemide  20 mg Oral Daily   insulin  aspart  0-9 Units Subcutaneous TID WC   losartan   75 mg Oral Daily   mirabegron  ER  50 mg Oral Daily   omega-3 acid ethyl esters  2 g Oral BID   pantoprazole   40 mg Oral BID   simvastatin   20 mg Oral QHS   sodium chloride flush  3 mL Intravenous Q12H   Continuous Infusions:  sodium chloride     cefTRIAXone (ROCEPHIN)  IV 2 g (07/05/23 0435)     LOS: 2 days    Time spent: over 30 min     Donnetta Gains, MD [facility]   To contact the attending provider between 7A-7P or the covering provider during after hours 7P-7A, please log into the web site [email or web address] and access using universal Burns password for that web site. If you do not have the  password, please call the hospital operator.  07/05/2023, 8:29 PM

## 2023-07-05 NOTE — Progress Notes (Signed)
 Rounding Note    Patient Name: Jasmin [name] Date of Encounter: 07/05/2023  [facility] Cardiologist: Ralene Burger, MD   Subjective   Patient feels good, wants to go home  Aware of her results of her cath report Discussed patient with EP -- they will see today   Inpatient Medications    Scheduled Meds:  aspirin  EC  81 mg Oral Daily   clopidogrel   75 mg Oral Daily   cycloSPORINE   1 drop Both Eyes BID   desmopressin   0.2 mg Oral QHS   enoxaparin (LOVENOX) injection  40 mg Subcutaneous Q24H   fesoterodine  4 mg Oral Daily   fluticasone   2 spray Each Nare Daily   insulin  aspart  0-9 Units Subcutaneous TID WC   losartan   75 mg Oral Daily   mirabegron  ER  50 mg Oral Daily   omega-3 acid ethyl esters  2 g Oral BID   pantoprazole   40 mg Oral BID   simvastatin   20 mg Oral QHS   sodium chloride flush  3 mL Intravenous Q12H   Continuous Infusions:  sodium chloride     cefTRIAXone (ROCEPHIN)  IV 2 g (07/05/23 0435)   PRN Meds: sodium chloride, acetaminophen , ondansetron  (ZOFRAN ) IV, oxyCODONE , Polyethyl Glycol-Propyl Glycol, sodium chloride flush   Vital Signs    Vitals:   07/04/23 2018 07/05/23 0027 07/05/23 0435 07/05/23 0756  BP: (!) 151/37 (!) 143/37 (!) 134/42 (!) 120/37  Pulse:  (!) 57 (!) 50 (!) 46  Resp: 18 19 20 19   Temp: 98.1 F (36.7 C) 98 F (36.7 C)  98.2 F (36.8 C)  TempSrc: Oral Oral  Oral  SpO2: 98% 97% 97%   Weight:   109.7 kg   Height:        Intake/Output Summary (Last 24 hours) at 07/05/2023 0853 Last data filed at 07/05/2023 0027 Gross per 24 hour  Intake 693.56 ml  Output 200 ml  Net 493.56 ml      07/05/2023    4:35 AM 07/04/2023    4:30 AM 07/02/2023    6:09 PM  Last 3 Weights  Weight (lbs) 241 lb 14.4 oz 238 lb 244 lb 6.4 oz  Weight (kg) 109.725 kg 107.956 kg 110.859 kg      Telemetry    AV block, HR 40s - Personally Reviewed  Physical Exam  GEN: No acute distress.   Neck: No JVD Cardiac: bradycardic, no  murmurs, rubs, or gallops.  Respiratory: Clear to auscultation bilaterally. GI: Soft, nontender, non-distended  MS: No edema; No deformity. Neuro:  Nonfocal  Psych: Normal affect   Labs    High Sensitivity Troponin:   Recent Labs  Lab 07/03/23 0748 07/03/23 1220  TROPONINIHS 12 14     Chemistry Recent Labs  Lab 07/02/23 1812 07/03/23 0157 07/03/23 0748 07/04/23 0427 07/04/23 1434 07/04/23 1526 07/04/23 1531 07/04/23 2028 07/05/23 0437  NA 137  --  139 139   < > 141  141 140  --  139  K 4.0  --  3.9 4.2   < > 3.8  3.8 3.8  --  3.8  CL 100  --  105 103  --   --   --   --  107  CO2 23  --  21* 25  --   --   --   --  23  GLUCOSE 174*  --  136* 134*  --   --   --   --  138*  BUN 18  --  13 22  --   --   --   --  19  CREATININE 0.75  --  0.62 0.81  --   --   --  0.77 0.76  CALCIUM  9.4  --  9.0 9.2  --   --   --   --  8.3*  MG  --  2.0  --  2.0  --   --   --   --  2.0  PROT 6.7  --   --   --   --   --   --   --   --   ALBUMIN 4.1  --   --   --   --   --   --   --   --   AST 24  --   --   --   --   --   --   --   --   ALT 21  --   --   --   --   --   --   --   --   ALKPHOS 63  --   --   --   --   --   --   --   --   BILITOT 0.5  --   --   --   --   --   --   --   --   GFRNONAA >60  --  >60 >60  --   --   --  >60 >60  ANIONGAP 14  --  13 11  --   --   --   --  9   < > = values in this interval not displayed.    Lipids  Recent Labs  Lab 07/03/23 0748  CHOL 127  TRIG 134  HDL 36*  LDLCALC 64  CHOLHDL 3.5    Hematology Recent Labs  Lab 07/04/23 0427 07/04/23 1434 07/04/23 1531 07/04/23 2028 07/05/23 0437  WBC 9.4  --   --  9.1 7.6  RBC 4.15  --   --  4.51 3.86*  HGB 11.9*   < > 12.2 12.9 10.9*  HCT 36.1   < > 36.0 39.6 34.0*  MCV 87.0  --   --  87.8 88.1  MCH 28.7  --   --  28.6 28.2  MCHC 33.0  --   --  32.6 32.1  RDW 15.0  --   --  14.8 15.1  PLT 273  --   --  304 280   < > = values in this interval not displayed.   Thyroid   Recent Labs  Lab  07/03/23 0653  TSH 2.269    BNP Recent Labs  Lab 07/03/23 0045  PROBNP 2,556.0*    DDimer  Recent Labs  Lab 07/03/23 0045  DDIMER 0.40     Radiology    CARDIAC CATHETERIZATION Result Date: 07/04/2023 Images from the original result were not included. Dominance: Right POST-OPERATIVE DIAGNOSIS:  Angiographically normal coronary arteries with a codominant system Very tortuous innominate artery making it very difficult to engage the RCA-nonselective images obtained. Relatively Normal Right Heart Cath Numbers: RAP mean 8 mmHg; RV P-EDP 37/0-13 mmHg; PAP-mean 38/21-22 mmHg with a PCWP of 24 mmHg. AO P-MAP 130/39-65 mmHg; LV P-EDP 164/13-19 mmHg. Ao sat 93%, PA sat 63%.  Cardiac output-index That (Fick) 5.57-2.74. ~Moderate aortic stenosis with mean AVG estimated 21 mmHg PLAN OF CARE: Return to nursing unit for ongoing care and  continued management. .  Anticipated discharge date to be determined.   Consider nonischemic etiology for the patient's symptoms. Likely need to evaluate bradycardia. Jasmin Bustard, MD  ECHOCARDIOGRAM COMPLETE Result Date: 07/03/2023    ECHOCARDIOGRAM REPORT   Patient Name:   Jasmin [name] Date of Exam: 07/03/2023 Medical Rec #:  [id number]       Height:       61.0 in Accession #:    [id number]      Weight:       244.4 lb Date of Birth:  1946/01/24       BSA:          2.057 m Patient Age:    78 years        BP:           118/75 mmHg Patient Gender: F               HR:           59 bpm. Exam Location:  Inpatient Procedure: 2D Echo, Cardiac Doppler and Color Doppler (Both Spectral and Color            Flow Doppler were utilized during procedure). Indications:    Fatigue, heart block, shortness of breath  History:        Patient has prior history of Echocardiogram examinations.  Sonographer:    Janette Medley Referring Phys: [id number] DANIEL V THOMPSON IMPRESSIONS  1. Left ventricular ejection fraction, by estimation, is 60 to 65%. The left ventricle has normal function. The left  ventricle has no regional wall motion abnormalities. There is moderate asymmetric left ventricular hypertrophy of the basal-septal segment. Left ventricular diastolic parameters are consistent with Grade II diastolic dysfunction (pseudonormalization).  2. Right ventricular systolic function is normal. The right ventricular size is normal. Tricuspid regurgitation signal is inadequate for assessing PA pressure.  3. Left atrial size was mildly dilated.  4. The mitral valve is degenerative. Trivial mitral valve regurgitation. No evidence of mitral stenosis. Moderate mitral annular calcification.  5. The aortic valve is tricuspid. There is moderate calcification of the aortic valve. Aortic valve regurgitation is mild. Moderate aortic valve stenosis. Aortic valve mean gradient measures 23.0 mmHg, AVA 1.38 cm^2.  6. Aortic dilatation noted. There is mild dilatation of the ascending aorta, measuring 40 mm.  7. The inferior vena cava is dilated in size with >50% respiratory variability, suggesting right atrial pressure of 8 mmHg. FINDINGS  Left Ventricle: Left ventricular ejection fraction, by estimation, is 60 to 65%. The left ventricle has normal function. The left ventricle has no regional wall motion abnormalities. The left ventricular internal cavity size was normal in size. There is  moderate asymmetric left ventricular hypertrophy of the basal-septal segment. Left ventricular diastolic parameters are consistent with Grade II diastolic dysfunction (pseudonormalization). Right Ventricle: The right ventricular size is normal. No increase in right ventricular wall thickness. Right ventricular systolic function is normal. Tricuspid regurgitation signal is inadequate for assessing PA pressure. Left Atrium: Left atrial size was mildly dilated. Right Atrium: Right atrial size was normal in size. Pericardium: There is no evidence of pericardial effusion. Mitral Valve: The mitral valve is degenerative in appearance. There is  mild calcification of the mitral valve leaflet(s). Moderate mitral annular calcification. Trivial mitral valve regurgitation. No evidence of mitral valve stenosis. Tricuspid Valve: The tricuspid valve is normal in structure. Tricuspid valve regurgitation is not demonstrated. Aortic Valve: The aortic valve is tricuspid. There is moderate calcification of the aortic valve. Aortic valve regurgitation  is mild. Moderate aortic stenosis is present. Aortic valve mean gradient measures 23.0 mmHg. Aortic valve peak gradient measures 22.7 mmHg. Aortic valve area, by VTI measures 1.99 cm. Pulmonic Valve: The pulmonic valve was normal in structure. Pulmonic valve regurgitation is not visualized. Aorta: The aortic root is normal in size and structure and aortic dilatation noted. There is mild dilatation of the ascending aorta, measuring 40 mm. Venous: The inferior vena cava is dilated in size with greater than 50% respiratory variability, suggesting right atrial pressure of 8 mmHg. IAS/Shunts: No atrial level shunt detected by color flow Doppler.  LEFT VENTRICLE PLAX 2D LVIDd:         4.60 cm   Diastology LVIDs:         3.20 cm   LV e' lateral: 12.80 cm/s LV PW:         1.30 cm LV IVS:        1.50 cm LVOT diam:     2.00 cm LV SV:         108 LV SV Index:   53 LVOT Area:     3.14 cm  RIGHT VENTRICLE             IVC RV S prime:     17.80 cm/s  IVC diam: 2.40 cm TAPSE (M-mode): 3.1 cm LEFT ATRIUM              Index        RIGHT ATRIUM           Index LA diam:        3.10 cm  1.51 cm/m   RA Area:     17.30 cm LA Vol (A2C):   114.0 ml 55.43 ml/m  RA Volume:   46.00 ml  22.37 ml/m LA Vol (A4C):   37.3 ml  18.14 ml/m LA Biplane Vol: 70.0 ml  34.03 ml/m  AORTIC VALVE AV Area (Vmax):    1.73 cm AV Area (Vmean):   1.61 cm AV Area (VTI):     1.99 cm AV Vmax:           238.00 cm/s AV Vmean:          164.500 cm/s AV VTI:            0.542 m AV Peak Grad:      22.7 mmHg AV Mean Grad:      23.0 mmHg LVOT Vmax:         131.00 cm/s  LVOT Vmean:        84.100 cm/s LVOT VTI:          0.344 m LVOT/AV VTI ratio: 0.64  AORTA Ao Root diam: 2.80 cm Ao Asc diam:  4.00 cm  SHUNTS Systemic VTI:  0.34 m Systemic Diam: 2.00 cm Dalton McleanMD Electronically signed by Archer Bear Signature Date/Time: 07/03/2023/6:38:42 PM    Final     Cardiac Studies   Echocardiogram, 07/02/2023 Left ventricular ejection fraction, by estimation, is 60 to 65% . The left ventricle has normal function. The left ventricle has no regional wall motion abnormalities. There is moderate asymmetric left ventricular hypertrophy of the basal- septal segment. Left ventricular diastolic parameters are consistent with Grade II diastolic dysfunction ( pseudonormalization) .  Right ventricular systolic function is normal. The right ventricular size is normal. Tricuspid regurgitation signal is inadequate for assessing PA pressure.  Left atrial size was mildly dilated.  The mitral valve is degenerative. Trivial mitral valve regurgitation. No evidence of mitral stenosis.  Moderate mitral annular calcification.  The aortic valve is tricuspid. There is moderate calcification of the aortic valve. Aortic valve regurgitation is mild. Moderate aortic valve stenosis. Aortic valve mean gradient measures 23. 0 mmHg, AVA 1. 38 cm^ 2.  Aortic dilatation noted. There is mild dilatation of the ascending aorta, measuring 40 mm.  The inferior vena cava is dilated in size with > 50% respiratory variability, suggesting right atrial pressure of 8 mmHg.   LHC/RHC, 07/04/2023 performed by Dr. Addie Holstein Angiographically normal coronary arteries with a codominant system Very tortuous innominate artery making it very difficult to engage the RCA-nonselective images obtained. Relatively Normal Right Heart Cath Numbers: RAP mean 8 mmHg; RV P-EDP 37/0-13 mmHg; PAP-mean 38/21-22 mmHg with a PCWP of 24 mmHg. AO P-MAP 130/39-65 mmHg; LV P-EDP 164/13-19 mmHg. Ao sat 93%, PA sat 63%.  Cardiac output-index  That (Fick) 5.57-2.74. ~Moderate aortic stenosis with mean AVG estimated 21 mmHg   PLAN OF CARE: Return to nursing unit for ongoing care and continued management.    Anticipated discharge date to be determined.   Consider nonischemic etiology for the patient's symptoms. Likely need to evaluate bradycardia.  Patient Profile     78 y.o. 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   Assessment & Plan    2:1 AV Block Coronary calcification on CT Moderate AS, mild AR  HR 40-50s at rest Not currently on any AV nodal blocking agents  Calcium  score 1,245 in 01/2022 Stress test was negative Significant calcification of RCA RHC/LHC showed: normal coronaries, tortuous innominate artery, moderate AS, PCWP 24 mmHg Echo showed: EF 60-65%, G2DD, moderate AS, mild AR Patient denies any current symptoms  Currently on ASA + Plavix  with history of prior CVA Spoke with EP, who is going to come see the patient    Hyperlipidemia, LDL goal < 70  10/10/2022: Direct LDL 100.0 07/02/2023: ALT 21 07/03/2023: HDL 36; LDL Cholesterol 64  Continue simvastatin  for now with LDL at goal    Hypertension  BP stable Currently on losartan  75 mg daily    Elevated BNP BNP 2,556 Euvolemic on exam RHC/LHC showed: normal coronaries, tortuous innominate artery, moderate AS, PCWP 24 mmHg CXR clear With wide pulse pressure, bradycardia, and multiple medications for overactive bladder would be cautious with diuresis  Patient currently on desmopressin  along with other medications for overactive bladder, which could further explain her fluid retention  Will discuss with MD regarding any diuresis      For questions or updates, please contact [facility] Please consult [email or web address] for contact info under        Signed, Jiles Mote, PA-C  07/05/2023, 8:53 AM

## 2023-07-05 NOTE — Care Management Important Message (Signed)
 Important Message  Patient Details  Name: Jasmin [name] MRN: [id number] Date of Birth: 1945/11/23   Important Message Given:  Yes - Medicare IM     Janith Melnick 07/05/2023, 11:23 AM

## 2023-07-05 NOTE — Consult Note (Signed)
 Cardiology Consultation   Patient ID: Jasmin [name] MRN: [id number]; DOB: 1945-08-19  Admit date: 07/02/2023 Date of Consult: 07/05/2023  PCP:  [name], MD   [facility] Providers Cardiologist:  Ralene Burger, MD   {    Patient Profile:   Jasmin [name] is a 78 y.o. female with a hx of  HTN, HLD, DM, stroke, OSA, GERD VHD w/ some degree of AS by history Coronary Ca++ by CT scoring  who is being seen 07/05/2023 for the evaluation of variable AV block at the request of Dr. Theodis Fiscal.  History of Present Illness:   Jasmin [name] was in her USOH until last week, she started to notice she was getting SOB with activities that were typically easy for her, gives an example of walking to/from the mailbox (300+ feet one way) able to do without rest, then had to stop mid way to catch her breath. This did seem to get better over the weekend but noted an unusual headache She thought perhaps was from Ozempic  a fairly new med for her Monday however she started to feel weak, poorly, abdominal fullness/discomfort and again easily winded and saught attention At [facility] ER she was noted in 2:1 block and transferred to [facility] for further evaluation She had no near syncope or syncope. Denied feeling lightheaded, dizzy.  She has had an echo this admission with LVEF 60-65%, grade II DD, mod AS R/LHC  Angiographically normal coronary arteries with a codominant system Very tortuous innominate artery making it very difficult to engage the RCA-nonselective images obtained. Relatively Normal Right Heart Cath Numbers: RAP mean 8 mmHg; RV P-EDP 37/0-13 mmHg; PAP-mean 38/21-22 mmHg with a PCWP of 24 mmHg. AO P-MAP 130/39-65 mmHg; LV P-EDP 164/13-19 mmHg. Ao sat 93%, PA sat 63%.  Cardiac output-index That (Fick) 5.57-2.74. ~Moderate aortic stenosis with mean AVG estimated 21 mmHg  EP is asked to see with persistent periods of 2:1 avblock no clear reversible causes noted  She did  get plavix  today > has been held She is not on any nodal blocking agents out patient or here  She reports she really feels quite well since here, no ongoing symptoms of any kind Reports walking up/down the halls, did get winded with PT but says she was walking "really fast"  Past Medical History:  Diagnosis Date   Acid reflux disease 03/09/2015   Acute on chronic heart failure (HCC) 07/03/2023   Aortic stenosis 03/09/2015   AR (allergic rhinitis) 03/09/2015   Arthritis    Back pain    Controlled type 2 diabetes mellitus without complication, without long-term current use of insulin  (HCC) 08/08/2016   Cough    Cystocele with rectocele 08/21/2016   Decreased hearing    Diabetes mellitus without complication (HCC)    Dry mouth    Easy bruising    Essential hypertension 08/08/2016   Heart murmur    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   History of CVA (cerebrovascular accident) 08/08/2016   Seen on MRI from 08/2015   Hyperlipidemia    Hypertension    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   IBS (irritable bowel syndrome) 07/16/2015   Insomnia 07/16/2015   Joint pain    Knee pain    Left ventricular hypertrophy 07/16/2015   Leg cramping    right leg   Mixed hyperlipidemia 08/08/2016   Morbid obesity (HCC) 11/10/2015   Nasal discharge    Nuclear sclerotic cataract of left eye 07/07/2016   OAB (overactive bladder) 01/20/2016  OSA (obstructive sleep apnea) 03/09/2015   Osteoporosis 03/09/2015   Primary osteoarthritis of left knee 03/31/2015   Pulmonary hypertension (HCC) 07/16/2015   Red eyes    Right kidney stone    Shortness of breath on exertion    Skin cancer 11/04/2020   Sleep apnea    Stroke (HCC)    Swelling of both lower extremities    UTI (urinary tract infection)    Vitamin D  deficiency 03/09/2015    Past Surgical History:  Procedure Laterality Date   ABDOMINAL HYSTERECTOMY  09/14/2016   CATARACT EXTRACTION Left    CATARACT EXTRACTION Left 06/2016   COLPORRHAPHY N/A  09/14/2016   [facility]   CYSTOCELE REPAIR     FRACTURE SURGERY Left 1953   L arm   FRACTURE SURGERY Left 1985   L ankle   HERNIA REPAIR  2004   JOINT REPLACEMENT Right 2012   R TKA   JOINT REPLACEMENT Left 2017   KIDNEY STONE SURGERY     LAPAROSCOPIC ASSISTED VAGINAL HYSTERECTOMY N/A 09/14/2016   [facility]   R foot/ankle Right 2013 and 2014   plate and screws lateral foot and tendon removal medial foot in 2013, revision in 2014   RECTOCELE REPAIR     RIGHT/LEFT HEART CATH AND CORONARY ANGIOGRAPHY N/A 07/04/2023   Procedure: RIGHT/LEFT HEART CATH AND CORONARY ANGIOGRAPHY;  Surgeon: Arleen Lacer, MD;  Location: [facility] INVASIVE CV LAB;  Service: Cardiovascular;  Laterality: N/A;   URETERAL STENT PLACEMENT     URETEROSCOPY       Home Medications:  Prior to Admission medications   Medication Sig Start Date End Date Taking? Authorizing Provider  Acetaminophen  (TYLENOL  ARTHRITIS PAIN PO) Take 650 mg by mouth every 8 (eight) hours as needed (PAIN).   Yes [provider]  amoxicillin  (AMOXIL ) 500 MG tablet Take 500 mg by mouth 2 (two) times daily. 1 hr prior to dentist visit   Yes [provider]  Ascorbic Acid  (VITAMIN C  WITH ROSE HIPS) 500 MG tablet Take 500 mg by mouth daily.   Yes [provider]  aspirin  EC 81 MG tablet Take 1 tablet (81 mg total) by mouth daily. Swallow whole. 06/02/22  Yes Copland, Skipper Dumas, MD  CALCIUM  PO Take 1 tablet by mouth daily.   Yes [provider]  Cholecalciferol (VITAMIN D3) 2000 units TABS Take 1 tablet by mouth in the morning and at bedtime.   Yes [provider]  clopidogrel  (PLAVIX ) 75 MG tablet Take 1 tablet (75 mg total) by mouth daily. 02/13/23  Yes Copland, Skipper Dumas, MD  clotrimazole -betamethasone  (LOTRISONE ) cream Apply to plantar foot daily 02/21/23  Yes   CRANBERRY EXTRACT PO Take 25,000 mg by mouth 2 (two) times daily.   Yes [provider]  cycloSPORINE  (RESTASIS ) 0.05 % ophthalmic emulsion Place 1  drop into both eyes 2 (two) times daily. 04/14/23  Yes   desmopressin  (DDAVP ) 0.2 MG tablet Take 1 tablet (0.2 mg total) by mouth at bedtime. 10/13/22  Yes   desonide  (DESOWEN ) 0.05 % cream Apply as directed to affected area. 06/06/23  Yes   econazole nitrate 1 % cream Apply 1 Application topically as needed (skin irritation).   Yes [provider]  estradiol  (ESTRACE ) 0.1 MG/GM vaginal cream Apply 0.5 grams vaginally nightly for 2 weeks, then apply 0.5 grams 2 times weekly 02/06/23  Yes   fluticasone  (FLONASE ) 50 MCG/ACT nasal spray Place 2 sprays into both nostrils daily. 04/24/23  Yes Copland, Skipper Dumas, MD  losartan  (COZAAR ) 50 MG tablet Take 1 & 1/2 tablets (75 mg total) by mouth daily. 06/21/23 06/20/24 Yes Copland, Skipper Dumas, MD  metFORMIN  (GLUCOPHAGE ) 500 MG tablet Take 1 tablet (500 mg total) by mouth daily with breakfast. 06/21/23  Yes Copland, Skipper Dumas, MD  mirabegron  ER (MYRBETRIQ ) 50 MG TB24 tablet Take 1 tablet (50 mg total) by mouth daily. 05/23/23  Yes Copland, Skipper Dumas, MD  omega-3 acid ethyl esters (LOVAZA ) 1 g capsule Take 2 capsules (2 g total) by mouth 2 (two) times daily. 05/16/23  Yes Copland, Skipper Dumas, MD  OXYCODONE  HCL PO Take 1 tablet by mouth as needed (pain).   Yes [provider]  pantoprazole  (PROTONIX ) 40 MG tablet Take 1 tablet (40 mg total) by mouth 2 (two) times daily. 09/01/22  Yes   Polyethyl Glycol-Propyl Glycol (SYSTANE OP) Apply 1 drop to eye daily as needed (for dry eyes).   Yes [provider]  PSYLLIUM PO Take 1 tablet by mouth daily.   Yes [provider]  simvastatin  (ZOCOR ) 20 MG tablet Take 1 tablet (20 mg total) by mouth at bedtime. 03/27/23  Yes Copland, Skipper Dumas, MD  solifenacin  (VESICARE ) 10 MG tablet Take 1 tablet (10 mg total) by mouth daily. Swallow whole do not crush, chew, or split. 06/02/23  Yes   Accu-Chek Softclix Lancets lancets USE AS DIRECTED TO CHECK BLOOD GLUCOSE ONCE DAILY 04/06/23   Copland, Jessica C, MD  blood  glucose meter kit and supplies KIT Dispense based on patient and insurance preference. Use once daily to monitor glucose as needed (FOR ICD-9 250.00, 250.01). Patient taking differently: Inject 1 each into the skin as directed. Dispense based on patient and insurance preference. Use once daily to monitor glucose as needed (FOR ICD-9 250.00, 250.01). 12/04/19   Copland, Skipper Dumas, MD  cycloSPORINE  (RESTASIS ) 0.05 % ophthalmic emulsion Place 1 drop into both eyes 2 (two) times daily. Patient not taking: Reported on 07/03/2023 10/19/22     glucose blood test strip Use as directed to check blood glucose daily. 01/10/23 01/09/24  Copland, Jessica C, MD    Inpatient Medications: Scheduled Meds:  aspirin  EC  81 mg Oral Daily   cycloSPORINE   1 drop Both Eyes BID   desmopressin   0.2 mg Oral QHS   fesoterodine  4 mg Oral Daily   fluticasone   2 spray Each Nare Daily   [START ON 07/06/2023] furosemide  20 mg Oral Daily   insulin  aspart  0-9 Units Subcutaneous TID WC   losartan   75 mg Oral Daily   mirabegron  ER  50 mg Oral Daily   omega-3 acid ethyl esters  2 g Oral BID   pantoprazole   40 mg Oral BID   simvastatin   20 mg Oral QHS   sodium chloride flush  3 mL Intravenous Q12H   Continuous Infusions:  sodium chloride     cefTRIAXone (ROCEPHIN)  IV 2 g (07/05/23 0435)   PRN Meds: sodium chloride, acetaminophen , ondansetron  (ZOFRAN ) IV, oxyCODONE , Polyethyl Glycol-Propyl Glycol, sodium chloride flush  Allergies:    Allergies  Allergen Reactions   Latex Itching   Ciprofloxacin Hives   Metronidazole Hives   Tape Rash and Itching    Redness of skin    Social History:   Social History   Socioeconomic History   Marital status: Married    Spouse name: Traneisha Tuchman   Number of children: 1   Years of education: Not on file   Highest education level: Not on file  Occupational History   Occupation: Retired Runner, broadcasting/film/video  Tobacco Use   Smoking status: Never    Passive exposure: Never   Smokeless  tobacco: Never  Vaping Use   Vaping status: Never Used  Substance and Sexual Activity   Alcohol use: Yes    Alcohol/week: 2.0 - 3.0 standard drinks of alcohol    Types: 2 - 3 Glasses of wine per week    Comment: couple of glasses per week   Drug use: No   Sexual activity: Not Currently  Other Topics Concern   Not on file  Social History Narrative   Not on file   Social Drivers of Health   Financial Resource Strain: Low Risk  (01/03/2023)   Overall Financial Resource Strain (CARDIA)    Difficulty of Paying Living Expenses: Not hard at all  Food Insecurity: No Food Insecurity (07/04/2023)   Hunger Vital Sign    Worried About Running Out of Food in the Last Year: Never true    Ran Out of Food in the Last Year: Never true  Transportation Needs: No Transportation Needs (07/04/2023)   PRAPARE - Administrator, Civil Service (Medical): No    Lack of Transportation (Non-Medical): No  Physical Activity: Sufficiently Active (01/03/2023)   Exercise Vital Sign    Days of Exercise per Week: 3 days    Minutes of Exercise per Session: 60 min  Stress: No Stress Concern Present (01/03/2023)   [employer] of Occupational Health - Occupational Stress Questionnaire    Feeling of Stress : Not at all  Social Connections: Socially Integrated (07/04/2023)   Social Connection and Isolation Panel [NHANES]    Frequency of Communication with Friends and Family: More than three times a week    Frequency of Social Gatherings with Friends and Family: More than three times a week    Attends Religious Services: More than 4 times per year    Active Member of [employer] or Organizations: Yes    Attends Engineer, structural: More than 4 times per year    Marital Status: Married  Catering manager Violence: Not At Risk (07/04/2023)   Humiliation, Afraid, Rape, and Kick questionnaire    Fear of Current or Ex-Partner: No    Emotionally Abused: No    Physically Abused: No    Sexually Abused:  No    Family History:   Family History  Problem Relation Age of Onset   Hyperlipidemia Mother    Heart disease Mother    Hypertension Mother    Stroke Mother    Thyroid  disease Mother    Hypertension Father    Heart disease Father      ROS:  Please see the history of present illness.  All other ROS reviewed and negative.     Physical Exam/Data:   Vitals:   07/05/23 0027 07/05/23 0435 07/05/23 0756 07/05/23 1146  BP: (!) 143/37 (!) 134/42 (!) 120/37 (!) 120/56  Pulse: (!) 57 (!) 50 (!) 46 (!) 43  Resp: 19 20 19 18   Temp: 98 F (36.7 C)  98.2 F (36.8 C) 97.9 F (36.6 C)  TempSrc: Oral  Oral Oral  SpO2: 97% 97%    Weight:  109.7 kg    Height:        Intake/Output Summary (Last 24 hours) at 07/05/2023 1425 Last data filed at 07/05/2023 0027 Gross per 24 hour  Intake 344.22 ml  Output 200 ml  Net 144.22 ml      07/05/2023  4:35 AM 07/04/2023    4:30 AM 07/02/2023    6:09 PM  Last 3 Weights  Weight (lbs) 241 lb 14.4 oz 238 lb 244 lb 6.4 oz  Weight (kg) 109.725 kg 107.956 kg 110.859 kg     Body mass index is 45.71 kg/m.  General:  Well nourished, well developed, in no acute distress HEENT: normal Neck: no JVD Vascular: No carotid bruits; Distal pulses 2+ bilaterally Cardiac: RRR; no murmurs, gallops or rubs Lungs:  CTA b/l, no wheezing, rhonchi or rales  Abd: soft, nontender, no hepatomegaly  Ext: no edema Musculoskeletal:  No deformities Skin: warm and dry  Neuro:  no focal abnormalities noted Psych:  Normal affect   EKG:  The EKG was personally reviewed and demonstrates:    #1 EKG is 2:1 AVblock 49bpm, RBBB (at baseline) #2 is 2:1 AVBlock 43bpm, conducted beats PR this EKG is significantly longer #3, appears SR 1:1 conduction, RBBB, with PR #4 I suspect this is 2:1 at 53bpm  OLD ST 112bpm, PR , RBBB  Telemetry:  Telemetry was personally reviewed and demonstrates:   She does have some SR with 1:1 conduction in the 70's and evidence  of Mobitz one with 2:1 conduction She spends hours in 2:1 with HRs 40's  Relevant CV Studies:  07/03/23: TTE 1. Left ventricular ejection fraction, by estimation, is 60 to 65%. The  left ventricle has normal function. The left ventricle has no regional  wall motion abnormalities. There is moderate asymmetric left ventricular  hypertrophy of the basal-septal  segment. Left ventricular diastolic parameters are consistent with Grade  II diastolic dysfunction (pseudonormalization).   2. Right ventricular systolic function is normal. The right ventricular  size is normal. Tricuspid regurgitation signal is inadequate for assessing  PA pressure.   3. Left atrial size was mildly dilated.   4. The mitral valve is degenerative. Trivial mitral valve regurgitation.  No evidence of mitral stenosis. Moderate mitral annular calcification.   5. The aortic valve is tricuspid. There is moderate calcification of the  aortic valve. Aortic valve regurgitation is mild. Moderate aortic valve  stenosis. Aortic valve mean gradient measures 23.0 mmHg, AVA 1.38 cm^2.   6. Aortic dilatation noted. There is mild dilatation of the ascending  aorta, measuring 40 mm.   7. The inferior vena cava is dilated in size with >50% respiratory  variability, suggesting right atrial pressure of 8 mmHg.   TTE 07/27/22  1. Left ventricular ejection fraction, by estimation, is 60 to 65%. The left ventricle has normal function. The left ventricle has no regional wall motion abnormalities. There is mild concentric left ventricular hypertrophy. Left ventricular diastolic  parameters are consistent with Grade I diastolic dysfunction (impaired relaxation)  Laboratory Data:  High Sensitivity Troponin:   Recent Labs  Lab 07/03/23 0748 07/03/23 1220  TROPONINIHS 12 14     Chemistry Recent Labs  Lab 07/03/23 0157 07/03/23 0748 07/04/23 0427 07/04/23 1434 07/04/23 1526 07/04/23 1531 07/04/23 2028 07/05/23 0437  NA  --  139  139   < > 141  141 140  --  139  K  --  3.9 4.2   < > 3.8  3.8 3.8  --  3.8  CL  --  105 103  --   --   --   --  107  CO2  --  21* 25  --   --   --   --  23  GLUCOSE  --  136* 134*  --   --   --   --  138*  BUN  --  13 22  --   --   --   --  19  CREATININE  --  0.62 0.81  --   --   --  0.77 0.76  CALCIUM   --  9.0 9.2  --   --   --   --  8.3*  MG 2.0  --  2.0  --   --   --   --  2.0  GFRNONAA  --  >60 >60  --   --   --  >60 >60  ANIONGAP  --  13 11  --   --   --   --  9   < > = values in this interval not displayed.    Recent Labs  Lab 07/02/23 1812  PROT 6.7  ALBUMIN 4.1  AST 24  ALT 21  ALKPHOS 63  BILITOT 0.5   Lipids  Recent Labs  Lab 07/03/23 0748  CHOL 127  TRIG 134  HDL 36*  LDLCALC 64  CHOLHDL 3.5    Hematology Recent Labs  Lab 07/04/23 0427 07/04/23 1434 07/04/23 1531 07/04/23 2028 07/05/23 0437  WBC 9.4  --   --  9.1 7.6  RBC 4.15  --   --  4.51 3.86*  HGB 11.9*   < > 12.2 12.9 10.9*  HCT 36.1   < > 36.0 39.6 34.0*  MCV 87.0  --   --  87.8 88.1  MCH 28.7  --   --  28.6 28.2  MCHC 33.0  --   --  32.6 32.1  RDW 15.0  --   --  14.8 15.1  PLT 273  --   --  304 280   < > = values in this interval not displayed.   Thyroid   Recent Labs  Lab 07/03/23 0653  TSH 2.269    BNP Recent Labs  Lab 07/03/23 0045  PROBNP 2,556.0*    DDimer  Recent Labs  Lab 07/03/23 0045  DDIMER 0.40     Radiology/Studies:   DG Chest 2 View Result Date: 07/02/2023 CLINICAL DATA:  Chest pain, SOB. EXAM: CHEST - 2 VIEW COMPARISON:  01/17/2022 FINDINGS: The heart size and mediastinal contours are within normal limits. No pneumothorax or pleural effusion. Lower lung interstitial prominence consistent with scarring or subsegmental atelectasis. Calcified aorta. Thoracic degenerative changes. IMPRESSION: Bibasilar subsegmental atelectasis or scarring. Electronically Signed   By: Sydell Eva M.D.   On: 07/02/2023 20:26     Assessment and Plan:   Symptomatic  bradycardia Acute reduction in exertional capacity Baseline RBBB No nodal blocking agents noted She is on Plavix  for hx of stroke and got it today  With less then obvious symptoms, particularly currently in 2:1 and feels "great".  We had her walk in the hall, her sinus rate increased (as did her V rate) though she continued in 2:1 AV block with no improvement in her AV conduction. Given this, Dr. Arlester Ladd did recommend PPM, though was not an emergency and given her Plavix  deferred today  NPO after MN, unclear when lab schedule will be able to accommodate her, they are aware    Risk Assessment/Risk Scores:    For questions or updates, please contact [facility] Please consult [email or web address] for contact info under    Signed, Debbie Fails, PA-C  07/05/2023 2:25 PM

## 2023-07-06 DIAGNOSIS — R001 Bradycardia, unspecified: Secondary | ICD-10-CM | POA: Diagnosis not present

## 2023-07-06 LAB — GLUCOSE, CAPILLARY
Glucose-Capillary: 136 mg/dL — ABNORMAL HIGH (ref 70–99)
Glucose-Capillary: 101 mg/dL — ABNORMAL HIGH (ref 70–99)
Glucose-Capillary: 140 mg/dL — ABNORMAL HIGH (ref 70–99)
Glucose-Capillary: 137 mg/dL — ABNORMAL HIGH (ref 70–99)

## 2023-07-06 LAB — CBC WITH DIFFERENTIAL/PLATELET
Abs Immature Granulocytes: 0.03 10*3/uL (ref 0.00–0.07)
Basophils Absolute: 0 10*3/uL (ref 0.0–0.1)
Basophils Relative: 1 %
Eosinophils Absolute: 0.3 10*3/uL (ref 0.0–0.5)
Eosinophils Relative: 4 %
HCT: 35.9 % — ABNORMAL LOW (ref 36.0–46.0)
Hemoglobin: 11.6 g/dL — ABNORMAL LOW (ref 12.0–15.0)
Immature Granulocytes: 0 %
Lymphocytes Relative: 24 %
Lymphs Abs: 1.8 10*3/uL (ref 0.7–4.0)
MCH: 28.4 pg (ref 26.0–34.0)
MCHC: 32.3 g/dL (ref 30.0–36.0)
MCV: 88 fL (ref 80.0–100.0)
Monocytes Absolute: 0.6 10*3/uL (ref 0.1–1.0)
Monocytes Relative: 8 %
Neutro Abs: 4.8 10*3/uL (ref 1.7–7.7)
Neutrophils Relative %: 63 %
Platelets: 287 10*3/uL (ref 150–400)
RBC: 4.08 MIL/uL (ref 3.87–5.11)
RDW: 14.9 % (ref 11.5–15.5)
WBC: 7.6 10*3/uL (ref 4.0–10.5)
nRBC: 0 % (ref 0.0–0.2)

## 2023-07-06 LAB — LIPOPROTEIN A (LPA): Lipoprotein (a): 165.5 nmol/L — ABNORMAL HIGH (ref <75.0)

## 2023-07-06 LAB — MAGNESIUM: Magnesium: 2.1 mg/dL (ref 1.7–2.4)

## 2023-07-06 LAB — COMPREHENSIVE METABOLIC PANEL WITH GFR
ALT: 13 U/L (ref 0–44)
AST: 24 U/L (ref 15–41)
Albumin: 3.2 g/dL — ABNORMAL LOW (ref 3.5–5.0)
Alkaline Phosphatase: 40 U/L (ref 38–126)
Anion gap: 10 (ref 5–15)
BUN: 19 mg/dL (ref 8–23)
CO2: 21 mmol/L — ABNORMAL LOW (ref 22–32)
Calcium: 8.9 mg/dL (ref 8.9–10.3)
Chloride: 108 mmol/L (ref 98–111)
Creatinine, Ser: 0.72 mg/dL (ref 0.44–1.00)
GFR, Estimated: >60 mL/min (ref >60)
Glucose, Bld: 125 mg/dL — ABNORMAL HIGH (ref 70–99)
Potassium: 4.2 mmol/L (ref 3.5–5.1)
Sodium: 139 mmol/L (ref 135–145)
Total Bilirubin: 1 mg/dL (ref 0.0–1.2)
Total Protein: 5.6 g/dL — ABNORMAL LOW (ref 6.5–8.1)

## 2023-07-06 LAB — PHOSPHORUS: Phosphorus: 4 mg/dL (ref 2.5–4.6)

## 2023-07-06 MED ORDER — SODIUM CHLORIDE 0.9% FLUSH
3.0000 mL | Freq: Two times a day (BID) | INTRAVENOUS | Status: DC
Start: 2023-07-06 — End: 2023-07-07

## 2023-07-06 MED ORDER — SODIUM CHLORIDE 0.9% FLUSH: 3.0000 mL | INTRAVENOUS | Status: DC | PRN

## 2023-07-06 MED ORDER — SODIUM CHLORIDE 0.9 % IV SOLN: 250.0000 mL | INTRAVENOUS | Status: DC

## 2023-07-06 MED ORDER — CHLORHEXIDINE GLUCONATE 4 % EX SOLN
60.0000 mL | Freq: Once | CUTANEOUS | Status: AC
  Administered 2023-07-06: 4 via TOPICAL
  Filled 2023-07-06: qty 15

## 2023-07-06 MED ORDER — CHLORHEXIDINE GLUCONATE 4 % EX SOLN
60.0000 mL | Freq: Once | CUTANEOUS | Status: DC
  Filled 2023-07-06: qty 15

## 2023-07-06 MED ORDER — CEFAZOLIN SODIUM-DEXTROSE 2-4 GM/100ML-% IV SOLN
2.0000 g | INTRAVENOUS | Status: AC
  Administered 2023-07-07: 2 g via INTRAVENOUS
  Filled 2023-07-06: qty 100

## 2023-07-06 MED ORDER — SODIUM CHLORIDE 0.9% FLUSH
3.0000 mL | Freq: Two times a day (BID) | INTRAVENOUS | Status: DC
  Administered 2023-07-07: 3 mL via INTRAVENOUS

## 2023-07-06 MED ORDER — HYDROCORTISONE 1 % EX CREA
TOPICAL_CREAM | Freq: Two times a day (BID) | CUTANEOUS | Status: DC | PRN
  Administered 2023-07-06: 1 via TOPICAL
  Filled 2023-07-06: qty 28

## 2023-07-06 MED ORDER — SODIUM CHLORIDE 0.9 % IV SOLN
80.0000 mg | INTRAVENOUS | Status: AC
  Administered 2023-07-07: 80 mg
  Filled 2023-07-06: qty 2

## 2023-07-06 MED ORDER — POLYETHYLENE GLYCOL 3350 17 G PO PACK
17.0000 g | PACK | Freq: Two times a day (BID) | ORAL | Status: DC
  Administered 2023-07-06 – 2023-07-08 (×5): 17 g via ORAL
  Filled 2023-07-06 (×5): qty 1

## 2023-07-06 NOTE — Progress Notes (Signed)
 PROGRESS NOTE    Jasmin [name]  UJW:[id number] DOB: 08/29/1945 DOA: 07/02/2023 PCP: [name], MD  Chief Complaint  Patient presents with   Shortness of Breath    Brief Narrative:   Patient 78 year old female history of type 2 diabetes, hypertension, sleep apnea presented to the ED with increasing dyspnea on exertion which she noticed after increased dose of Ozempic . Patient with dyspnea on minimal exertion. Patient seen in the ED noted to be bradycardic with [name] 2-1 AV block with prolonged PR up to 450 MS. It was noted that while ambulating in the ER patient's heart rate did go up into the 70s. Patient admitted for further evaluation. Cardiology consulted.   Assessment & Plan:   Principal Problem:   Bradycardia Active Problems:   Mixed hyperlipidemia   Essential hypertension   OSA on CPAP   Diabetes mellitus without complication (HCC)   UTI (urinary tract infection)   Symptomatic bradycardia   2nd degree AV block   CAD in native artery   Acute on chronic heart failure (HCC)   Morbid obesity (HCC)  2:1 AV block Symptomatic Bradycardia Appreciate cards assistance S/p L/RHC with angiographically normal coronary arteries - consider nonischemic etiology for patient's symptoms, eval bradycardia Appreciate EP eval, planning for PPM - likely tmrw PM  Aortic Valve Stenosis Cards follow up  Dyslipidemia Simvastatin   Hypertension losartan  Hx CVA Aspirin , plavix  (currently on hold) OSA Nightly CPAP  T2DM SSI On metformin  prior to admission She asks again if ozempic  related, I think this is unlikely based on her symptoms  E. Coli UTI Narrow to duricef     DVT prophylaxis: SCD Code Status: full Family Communication: husband Disposition:   Status is: Inpatient Remains inpatient appropriate because: cards consult   Consultants:  cardiology  Procedures:  L/RHC POST-OPERATIVE DIAGNOSIS:   Angiographically normal coronary arteries with Amariz Flamenco codominant  system Very tortuous innominate artery making it very difficult to engage the RCA-nonselective images obtained. Relatively Normal Right Heart Cath Numbers: RAP mean 8 mmHg; RV P-EDP 37/0-13 mmHg; PAP-mean 38/21-22 mmHg with Zayyan Mullen PCWP of 24 mmHg. AO P-MAP 130/39-65 mmHg; LV P-EDP 164/13-19 mmHg. Ao sat 93%, PA sat 63%.  Cardiac output-index That (Fick) 5.57-2.74. ~Moderate aortic stenosis with mean AVG estimated 21 mmHg   PLAN OF CARE: Return to nursing unit for ongoing care and continued management.  .  Anticipated discharge date to be determined.   Consider nonischemic etiology for the patient's symptoms. Likely need to evaluate bradycardia.  Echo IMPRESSIONS     1. Left ventricular ejection fraction, by estimation, is 60 to 65%. The  left ventricle has normal function. The left ventricle has no regional  wall motion abnormalities. There is moderate asymmetric left ventricular  hypertrophy of the basal-septal  segment. Left ventricular diastolic parameters are consistent with Grade  II diastolic dysfunction (pseudonormalization).   2. Right ventricular systolic function is normal. The right ventricular  size is normal. Tricuspid regurgitation signal is inadequate for assessing  PA pressure.   3. Left atrial size was mildly dilated.   4. The mitral valve is degenerative. Trivial mitral valve regurgitation.  No evidence of mitral stenosis. Moderate mitral annular calcification.   5. The aortic valve is tricuspid. There is moderate calcification of the  aortic valve. Aortic valve regurgitation is mild. Moderate aortic valve  stenosis. Aortic valve mean gradient measures 23.0 mmHg, AVA 1.38 cm^2.   6. Aortic dilatation noted. There is mild dilatation of the ascending  aorta, measuring 40 mm.  7. The inferior vena cava is dilated in size with >50% respiratory  variability, suggesting right atrial pressure of 8 mmHg.   Antimicrobials:  Anti-infectives (From admission, onward)    Start      Dose/Rate Route Frequency Ordered Stop   07/05/23 2200  cefadroxil (DURICEF) capsule 500 mg        500 mg Oral 2 times daily 07/05/23 2036     07/04/23 0400  cefTRIAXone (ROCEPHIN) 2 g in sodium chloride 0.9 % 100 mL IVPB  Status:  Discontinued        2 g 200 mL/hr over 30 Minutes Intravenous Every 24 hours 07/03/23 0745 07/05/23 2036   07/03/23 0415  cefTRIAXone (ROCEPHIN) 1 g in sodium chloride 0.9 % 100 mL IVPB        1 g 200 mL/hr over 30 Minutes Intravenous  Once 07/03/23 0413 07/03/23 0455       Subjective: Frustrated regarding wait  Objective: Vitals:   07/06/23 0504 07/06/23 0729 07/06/23 1140 07/06/23 1655  BP: (!) 152/55 (!) 173/68 (!) 152/63 (!) 119/56  Pulse: 75 75  71  Resp: 18 17 16 16   Temp: 98.4 F (36.9 C) 97.8 F (36.6 C) 98.2 F (36.8 C) (!) 97.5 F (36.4 C)  TempSrc: Oral Oral Oral Oral  SpO2: 97% 98% 97% 97%  Weight: 110.4 kg     Height:        Intake/Output Summary (Last 24 hours) at 07/06/2023 1804 Last data filed at 07/05/2023 2237 Gross per 24 hour  Intake 3 ml  Output --  Net 3 ml   Filed Weights   07/04/23 0430 07/05/23 0435 07/06/23 0504  Weight: 108 kg 109.7 kg 110.4 kg    Examination:  General: No acute distress. Cardiovascular: RRR Lungs: unlabored Abdomen: Soft, nontender, nondistended Neurological: Alert and oriented 3. Moves all extremities 4 with equal strength. Cranial nerves II through XII grossly intact. Extremities: No clubbing or cyanosis. No edema.    Data Reviewed: I have personally reviewed following labs and imaging studies  CBC: Recent Labs  Lab 07/02/23 1812 07/03/23 0748 07/04/23 0427 07/04/23 1434 07/04/23 1526 07/04/23 1531 07/04/23 2028 07/05/23 0437 07/06/23 0515  WBC 11.3* 11.0* 9.4  --   --   --  9.1 7.6 7.6  NEUTROABS 7.6 9.0* 6.6  --   --   --   --   --  4.8  HGB 12.3 12.4 11.9*   < > 11.9*  11.9* 12.2 12.9 10.9* 11.6*  HCT 37.4 39.1 36.1   < > 35.0*  35.0* 36.0 39.6 34.0* 35.9*  MCV  87.2 91.4 87.0  --   --   --  87.8 88.1 88.0  PLT 305 276 273  --   --   --  304 280 287   < > = values in this interval not displayed.    Basic Metabolic Panel: Recent Labs  Lab 07/02/23 1812 07/03/23 0157 07/03/23 0748 07/04/23 0427 07/04/23 1434 07/04/23 1526 07/04/23 1531 07/04/23 2028 07/05/23 0437 07/06/23 0515  NA 137  --  139 139 140 141  141 140  --  139 139  K 4.0  --  3.9 4.2 3.8 3.8  3.8 3.8  --  3.8 4.2  CL 100  --  105 103  --   --   --   --  107 108  CO2 23  --  21* 25  --   --   --   --  23 21*  GLUCOSE 174*  --  136* 134*  --   --   --   --  138* 125*  BUN 18  --  13 22  --   --   --   --  19 19  CREATININE 0.75  --  0.62 0.81  --   --   --  0.77 0.76 0.72  CALCIUM  9.4  --  9.0 9.2  --   --   --   --  8.3* 8.9  MG  --  2.0  --  2.0  --   --   --   --  2.0 2.1  PHOS  --   --   --   --   --   --   --   --   --  4.0    GFR: Estimated Creatinine Clearance: 66.6 mL/min (by C-G formula based on SCr of 0.72 mg/dL).  Liver Function Tests: Recent Labs  Lab 07/02/23 1812 07/06/23 0515  AST 24 24  ALT 21 13  ALKPHOS 63 40  BILITOT 0.5 1.0  PROT 6.7 5.6*  ALBUMIN 4.1 3.2*    CBG: Recent Labs  Lab 07/05/23 1627 07/05/23 2120 07/06/23 0811 07/06/23 1136 07/06/23 1651  GLUCAP 135* 119* 136* 101* 140*     Recent Results (from the past 240 hours)  Resp panel by RT-PCR (RSV, Flu Kavari Parrillo&B, Covid) Anterior Nasal Swab     Status: None   Collection Time: 07/03/23 12:27 AM   Specimen: Anterior Nasal Swab  Result Value Ref Range Status   SARS Coronavirus 2 by RT PCR NEGATIVE NEGATIVE Final    Comment: (NOTE) SARS-CoV-2 target nucleic acids are NOT DETECTED.  The SARS-CoV-2 RNA is generally detectable in upper respiratory specimens during the acute phase of infection. The lowest concentration of SARS-CoV-2 viral copies this assay can detect is 138 copies/mL. Nefertiti Mohamad negative result does not preclude SARS-Cov-2 infection and should not be used as the sole basis  for treatment or other patient management decisions. Tatianna Ibbotson negative result may occur with  improper specimen collection/handling, submission of specimen other than nasopharyngeal swab, presence of viral mutation(s) within the areas targeted by this assay, and inadequate number of viral copies(<138 copies/mL). Sahej Hauswirth negative result must be combined with clinical observations, patient history, and epidemiological information. The expected result is Negative.  Fact Sheet for Patients:  BloggerCourse.com  Fact Sheet for Healthcare Providers:  SeriousBroker.it  This test is no t yet approved or cleared by the [place]  FDA and  has been authorized for detection and/or diagnosis of SARS-CoV-2 by FDA under an Emergency Use Authorization (EUA). This EUA will remain  in effect (meaning this test can be used) for the duration of the COVID-19 declaration under Section 564(b)(1) of the Act, 21 U.S.C.section 360bbb-3(b)(1), unless the authorization is terminated  or revoked sooner.       Influenza Hadasa Gasner by PCR NEGATIVE NEGATIVE Final   Influenza B by PCR NEGATIVE NEGATIVE Final    Comment: (NOTE) The Xpert Xpress SARS-CoV-2/FLU/RSV plus assay is intended as an aid in the diagnosis of influenza from Nasopharyngeal swab specimens and should not be used as Sharina Petre sole basis for treatment. Nasal washings and aspirates are unacceptable for Xpert Xpress SARS-CoV-2/FLU/RSV testing.  Fact Sheet for Patients: BloggerCourse.com  Fact Sheet for Healthcare Providers: SeriousBroker.it  This test is not yet approved or cleared by the [place]  FDA and has been authorized for detection and/or diagnosis of SARS-CoV-2 by FDA under an Emergency Use Authorization (EUA).  This EUA will remain in effect (meaning this test can be used) for the duration of the COVID-19 declaration under Section 564(b)(1) of the Act, 21  U.S.C. section 360bbb-3(b)(1), unless the authorization is terminated or revoked.     Resp Syncytial Virus by PCR NEGATIVE NEGATIVE Final    Comment: (NOTE) Fact Sheet for Patients: BloggerCourse.com  Fact Sheet for Healthcare Providers: SeriousBroker.it  This test is not yet approved or cleared by the [place]  FDA and has been authorized for detection and/or diagnosis of SARS-CoV-2 by FDA under an Emergency Use Authorization (EUA). This EUA will remain in effect (meaning this test can be used) for the duration of the COVID-19 declaration under Section 564(b)(1) of the Act, 21 U.S.C. section 360bbb-3(b)(1), unless the authorization is terminated or revoked.  Performed at [facility], [address] [place], [place] [address]   Urine Culture     Status: Abnormal   Collection Time: 07/03/23  1:35 AM   Specimen: Urine, Clean Catch  Result Value Ref Range Status   Specimen Description   Final    URINE, CLEAN CATCH Performed at [facility], [address] [place], [place] [address]    Special Requests   Final    NONE Performed at [facility], [address] [place], [place] [address]    Culture >=100,000 COLONIES/mL ESCHERICHIA COLI (Kanai Berrios)  Final   Report Status 07/05/2023 FINAL  Final   Organism ID, Bacteria ESCHERICHIA COLI (Miking Usrey)  Final      Susceptibility   Escherichia coli - MIC*    AMPICILLIN >=32 RESISTANT Resistant     CEFAZOLIN  <=4 SENSITIVE Sensitive     CEFEPIME <=0.12 SENSITIVE Sensitive     CEFTRIAXONE <=0.25 SENSITIVE Sensitive     CIPROFLOXACIN <=0.25 SENSITIVE Sensitive     GENTAMICIN <=1 SENSITIVE Sensitive     IMIPENEM 1 SENSITIVE Sensitive     NITROFURANTOIN  <=16 SENSITIVE Sensitive     TRIMETH /SULFA  <=20 SENSITIVE Sensitive     AMPICILLIN/SULBACTAM 16 INTERMEDIATE Intermediate     PIP/TAZO <=4 SENSITIVE Sensitive ug/mL    * >=100,000 COLONIES/mL ESCHERICHIA COLI   Surgical PCR screen     Status: None   Collection Time: 07/05/23  3:14 PM   Specimen: Nasal Mucosa; Nasal Swab  Result Value Ref Range Status   MRSA, PCR NEGATIVE NEGATIVE Final   Staphylococcus aureus NEGATIVE NEGATIVE Final    Comment: (NOTE) The Xpert SA Assay (FDA approved for NASAL specimens in patients 62 years of age and older), is one component of Josmar Messimer comprehensive surveillance program. It is not intended to diagnose infection nor to guide or monitor treatment. Performed at [facility], [address], [place], [place] [address]          Radiology Studies: No results found.       Scheduled Meds:  aspirin  EC  81 mg Oral Daily   cefadroxil  500 mg Oral BID   cycloSPORINE   1 drop Both Eyes BID   desmopressin   0.2 mg Oral QHS   fesoterodine  4 mg Oral Daily   fluticasone   2 spray Each Nare Daily   furosemide  20 mg Oral Daily   insulin  aspart  0-9 Units Subcutaneous TID WC   losartan   75 mg Oral Daily   mirabegron  ER  50 mg Oral Daily   omega-3 acid ethyl esters  2 g Oral BID   pantoprazole   40 mg Oral BID   polyethylene  glycol  17 g Oral BID   simvastatin   20 mg Oral QHS   sodium chloride flush  3 mL Intravenous Q12H   sodium chloride flush  3 mL Intravenous Q12H   Continuous Infusions:     LOS: 3 days    Time spent: over 30 min     Donnetta Gains, MD [facility]   To contact the attending provider between 7A-7P or the covering provider during after hours 7P-7A, please log into the web site [email or web address] and access using universal [place] password for that web site. If you do not have the password, please call the hospital operator.  07/06/2023, 6:04 PM

## 2023-07-06 NOTE — Plan of Care (Signed)
  Problem: Education: Goal: Ability to describe self-care measures that may prevent or decrease complications (Diabetes Survival Skills Education) will improve Outcome: Progressing Goal: Individualized Educational Video(s) Outcome: Progressing   Problem: Coping: Goal: Ability to adjust to condition or change in health will improve Outcome: Progressing   Problem: Fluid Volume: Goal: Ability to maintain a balanced intake and output will improve Outcome: Progressing   Problem: Health Behavior/Discharge Planning: Goal: Ability to identify and utilize available resources and services will improve Outcome: Progressing Goal: Ability to manage health-related needs will improve Outcome: Progressing   Problem: Metabolic: Goal: Ability to maintain appropriate glucose levels will improve Outcome: Progressing   Problem: Nutritional: Goal: Maintenance of adequate nutrition will improve Outcome: Progressing Goal: Progress toward achieving an optimal weight will improve Outcome: Progressing   Problem: Skin Integrity: Goal: Risk for impaired skin integrity will decrease Outcome: Progressing   Problem: Tissue Perfusion: Goal: Adequacy of tissue perfusion will improve Outcome: Progressing   Problem: Education: Goal: Knowledge of General Education information will improve Description: Including pain rating scale, medication(s)/side effects and non-pharmacologic comfort measures Outcome: Progressing   Problem: Health Behavior/Discharge Planning: Goal: Ability to manage health-related needs will improve Outcome: Progressing   Problem: Clinical Measurements: Goal: Ability to maintain clinical measurements within normal limits will improve Outcome: Progressing Goal: Will remain free from infection Outcome: Progressing Goal: Diagnostic test results will improve Outcome: Progressing Goal: Respiratory complications will improve Outcome: Progressing Goal: Cardiovascular complication will  be avoided Outcome: Progressing   Problem: Activity: Goal: Risk for activity intolerance will decrease Outcome: Progressing   Problem: Nutrition: Goal: Adequate nutrition will be maintained Outcome: Progressing   Problem: Coping: Goal: Level of anxiety will decrease Outcome: Progressing   Problem: Elimination: Goal: Will not experience complications related to bowel motility Outcome: Progressing Goal: Will not experience complications related to urinary retention Outcome: Progressing   Problem: Pain Managment: Goal: General experience of comfort will improve and/or be controlled Outcome: Progressing   Problem: Safety: Goal: Ability to remain free from injury will improve Outcome: Progressing   Problem: Skin Integrity: Goal: Risk for impaired skin integrity will decrease Outcome: Progressing   Problem: Education: Goal: Understanding of CV disease, CV risk reduction, and recovery process will improve Outcome: Progressing Goal: Individualized Educational Video(s) Outcome: Progressing   Problem: Activity: Goal: Ability to return to baseline activity level will improve Outcome: Progressing   Problem: Cardiovascular: Goal: Ability to achieve and maintain adequate cardiovascular perfusion will improve Outcome: Progressing Goal: Vascular access site(s) Level 0-1 will be maintained Outcome: Progressing   Problem: Health Behavior/Discharge Planning: Goal: Ability to safely manage health-related needs after discharge will improve Outcome: Progressing   Problem: Education: Goal: Knowledge of cardiac device and self-care will improve Outcome: Progressing Goal: Ability to safely manage health related needs after discharge will improve Outcome: Progressing Goal: Individualized Educational Video(s) Outcome: Progressing   Problem: Cardiac: Goal: Ability to achieve and maintain adequate cardiopulmonary perfusion will improve Outcome: Progressing

## 2023-07-06 NOTE — Progress Notes (Addendum)
 Rounding Note    Patient Name: Jasmin [name] Date of Encounter: 07/06/2023  [facility] Cardiologist: Ralene Burger, MD   Subjective   She feels very well, has ambulated without difficulty  Inpatient Medications    Scheduled Meds:  aspirin  EC  81 mg Oral Daily   cefadroxil  500 mg Oral BID   cycloSPORINE   1 drop Both Eyes BID   desmopressin   0.2 mg Oral QHS   fesoterodine  4 mg Oral Daily   fluticasone   2 spray Each Nare Daily   furosemide  20 mg Oral Daily   insulin  aspart  0-9 Units Subcutaneous TID WC   losartan   75 mg Oral Daily   mirabegron  ER  50 mg Oral Daily   omega-3 acid ethyl esters  2 g Oral BID   pantoprazole   40 mg Oral BID   polyethylene glycol  17 g Oral BID   simvastatin   20 mg Oral QHS   sodium chloride flush  3 mL Intravenous Q12H   Continuous Infusions:  PRN Meds: acetaminophen , ondansetron  (ZOFRAN ) IV, oxyCODONE , Polyethyl Glycol-Propyl Glycol, sodium chloride flush   Vital Signs    Vitals:   07/05/23 2117 07/06/23 0050 07/06/23 0504 07/06/23 0729  BP: 128/62 (!) 109/59 (!) 152/55 (!) 173/68  Pulse: 62 75 75 75  Resp: 20 16 18 17   Temp: 98.2 F (36.8 C) 98.6 F (37 C) 98.4 F (36.9 C) 97.8 F (36.6 C)  TempSrc: Oral Oral Oral Oral  SpO2: 100% 98% 97% 98%  Weight:   110.4 kg   Height:        Intake/Output Summary (Last 24 hours) at 07/06/2023 1117 Last data filed at 07/05/2023 2237 Gross per 24 hour  Intake 3 ml  Output --  Net 3 ml      07/06/2023    5:04 AM 07/05/2023    4:35 AM 07/04/2023    4:30 AM  Last 3 Weights  Weight (lbs) 243 lb 4.8 oz 241 lb 14.4 oz 238 lb  Weight (kg) 110.36 kg 109.725 kg 107.956 kg      Telemetry    SR, 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  Block burden is less, not resolved  - Personally Reviewed  ECG    No new EKGs  - Personally Reviewed  Physical Exam   GEN: No acute distress.   Neck: No JVD Cardiac: RRR, no murmurs, rubs, or gallops.   Respiratory: CTA b/l. GI: Soft, nontender, non-distended  MS: No edema; No deformity. Neuro:  Nonfocal  Psych: Normal affect   Labs    High Sensitivity Troponin:   Recent Labs  Lab 07/03/23 0748 07/03/23 1220  TROPONINIHS 12 14     Chemistry Recent Labs  Lab 07/02/23 1812 07/03/23 0157 07/04/23 0427 07/04/23 1434 07/04/23 1531 07/04/23 2028 07/05/23 0437 07/06/23 0515  NA 137   < > 139   < > 140  --  139 139  K 4.0   < > 4.2   < > 3.8  --  3.8 4.2  CL 100   < > 103  --   --   --  107 108  CO2 23   < > 25  --   --   --  23 21*  GLUCOSE 174*   < > 134*  --   --   --  138* 125*  BUN 18   < > 22  --   --   --  19 19  CREATININE 0.75   < > 0.81  --   --  0.77 0.76 0.72  CALCIUM  9.4   < > 9.2  --   --   --  8.3* 8.9  MG  --    < > 2.0  --   --   --  2.0 2.1  PROT 6.7  --   --   --   --   --   --  5.6*  ALBUMIN 4.1  --   --   --   --   --   --  3.2*  AST 24  --   --   --   --   --   --  24  ALT 21  --   --   --   --   --   --  13  ALKPHOS 63  --   --   --   --   --   --  40  BILITOT 0.5  --   --   --   --   --   --  1.0  GFRNONAA >60   < > >60  --   --  >60 >60 >60  ANIONGAP 14   < > 11  --   --   --  9 10   < > = values in this interval not displayed.    Lipids  Recent Labs  Lab 07/03/23 0748  CHOL 127  TRIG 134  HDL 36*  LDLCALC 64  CHOLHDL 3.5    Hematology Recent Labs  Lab 07/04/23 2028 07/05/23 0437 07/06/23 0515  WBC 9.1 7.6 7.6  RBC 4.51 3.86* 4.08  HGB 12.9 10.9* 11.6*  HCT 39.6 34.0* 35.9*  MCV 87.8 88.1 88.0  MCH 28.6 28.2 28.4  MCHC 32.6 32.1 32.3  RDW 14.8 15.1 14.9  PLT 304 280 287   Thyroid   Recent Labs  Lab 07/03/23 0653  TSH 2.269    BNP Recent Labs  Lab 07/03/23 0045  PROBNP 2,556.0*    DDimer  Recent Labs  Lab 07/03/23 0045  DDIMER 0.40     Radiology      Cardiac Studies   echo this admission with LVEF 60-65%, grade II DD, mod AS  R/LHC  Angiographically normal coronary arteries with a codominant  system Very tortuous innominate artery making it very difficult to engage the RCA-nonselective images obtained. Relatively Normal Right Heart Cath Numbers: RAP mean 8 mmHg; RV P-EDP 37/0-13 mmHg; PAP-mean 38/21-22 mmHg with a PCWP of 24 mmHg. AO P-MAP 130/39-65 mmHg; LV P-EDP 164/13-19 mmHg. Ao sat 93%, PA sat 63%.  Cardiac output-index That (Fick) 5.57-2.74. ~Moderate aortic stenosis with mean AVG estimated 21 mmHg  Patient Profile     78 y.o. 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 This did seem to get better over the weekend but noted an unusual headache She thought perhaps was from Ozempic  a fairly new med for her Monday however she started to feel weak, poorly, abdominal fullness/discomfort and again easily winded and saught attention At [facility] ER she was noted in 2:1 block and transferred to [facility] for further evaluation She had no near syncope  or syncope. Denied feeling lightheaded, dizzy.  Assessment & Plan    Symptomatic bradycardia Acute reduction in exertional capacity Baseline RBBB No nodal blocking agents noted She is on Plavix  for hx of stroke and got it 07/05/23   With less then obvious symptoms, particularly currently in 2:1 and feels "great".  We had her walk in the hall yesterday 5/14, her sinus rate increased (as did her V rate) though she continued in 2:1 AV block with no improvement in her AV conduction. Given this, Dr. Arlester Ladd did recommend PPM, though was not an emergency and given her Plavix  deferred to next available lab time.  The last 24hours conduction has improved, last time her HRs was in the 40's (nothing below that) was ~0600.  She has been 1:1 conducting this morning  consistently  Discussed improvement in telemetry seems to look better though without any partgicular intervention, suspect we would still pursue pacing She again inquires about the timing of her symptoms after taking Ozempic  > though in review with [facility]PH, there has been no data that notes bradycardia/heart block with the drug (there some increased rates and BP noted if anything)  Dr. Marven Slimmer will see NPO for poss PPM  E.coli UTI On ABX via attending/IM team    For questions or updates, please contact [facility] Please consult [email or web address] for contact info under        Signed, Debbie Fails, PA-C  07/06/2023, 11:17 AM

## 2023-07-06 NOTE — Plan of Care (Signed)
 Discussed with patient and husband plan of care for the evening, pain management and schedule for surgery tomorrow at 1630 with some teach back displayed.  AM hibiclens will have to be done on first shift.  Problem: Coping: Goal: Ability to adjust to condition or change in health will improve Outcome: Progressing   Problem: Pain Managment: Goal: General experience of comfort will improve and/or be controlled Outcome: Progressing

## 2023-07-06 NOTE — TOC CM/SW Note (Addendum)
 Transition of Care [facility]) - Inpatient Brief Assessment   Patient Details  Name: Jasmin [name] MRN: [id number] Date of Birth: [birth year]  [facility] CM/SW Contact:    Cosimo Diones, RN Phone Number: 07/06/2023, 10:42 AM   Clinical Narrative: Patient presented for bradycardia-plan for PPM. PTA patient was independent from home with spouse. Spouse at the bedside during the visit. Patient has all DME in the home from a previous knee replacement. No home needs identified at the time of the visit. Case Manager will continue to follow for additional needs.    Transition of Care Asessment: Insurance and Status: Insurance coverage has been reviewed Patient has primary care physician: Yes Home environment has been reviewed: reviewed Prior level of function:: independent Prior/Current Home Services: No current home services Social Drivers of Health Review: SDOH reviewed no interventions necessary Readmission risk has been reviewed: Yes Transition of care needs: no transition of care needs at this time

## 2023-07-06 NOTE — Progress Notes (Signed)
 Mobility Specialist Progress Note;   07/06/23 1057  Mobility  Activity Ambulated independently in hallway  Level of Assistance Standby assist, set-up cues, supervision of patient - no hands on  Assistive Device None  Distance Ambulated (ft) 400 ft  Activity Response Tolerated well  Mobility Referral Yes  Mobility visit 1 Mobility  Mobility Specialist Start Time (ACUTE ONLY) 1057  Mobility Specialist Stop Time (ACUTE ONLY) 1107  Mobility Specialist Time Calculation (min) (ACUTE ONLY) 10 min    Pre-mobility: HR 62 bpm During-mobility: HR 101 bpm   Pt eager for mobility. Required no physical assistance during ambulation, SV. HR up to 101 bpm w/ activity. No c/o when asked. Pt returned back to bed with all needs met. Husband in room.   Janit Meline Mobility Specialist Please contact via SecureChat or [employer] [phone]

## 2023-07-07 ENCOUNTER — Encounter (HOSPITAL_COMMUNITY)
Admission: EM | Disposition: A | Discharge status: Home / Self Care | Payer: Self-pay | Source: Home / Self Care | Attending: Family Medicine

## 2023-07-07 ENCOUNTER — Other Ambulatory Visit: Payer: Self-pay

## 2023-07-07 DIAGNOSIS — R001 Bradycardia, unspecified: Secondary | ICD-10-CM | POA: Diagnosis not present

## 2023-07-07 DIAGNOSIS — I441 Atrioventricular block, second degree: Secondary | ICD-10-CM | POA: Diagnosis not present

## 2023-07-07 HISTORY — PX: PACEMAKER IMPLANT: EP1218

## 2023-07-07 LAB — CBC WITH DIFFERENTIAL/PLATELET
Abs Immature Granulocytes: 0.02 10*3/uL (ref 0.00–0.07)
Basophils Absolute: 0.1 10*3/uL (ref 0.0–0.1)
Basophils Relative: 1 %
Eosinophils Absolute: 0.3 10*3/uL (ref 0.0–0.5)
Eosinophils Relative: 4 %
HCT: 36.9 % (ref 36.0–46.0)
Hemoglobin: 12.1 g/dL (ref 12.0–15.0)
Immature Granulocytes: 0 %
Lymphocytes Relative: 21 %
Lymphs Abs: 1.4 10*3/uL (ref 0.7–4.0)
MCH: 28.5 pg (ref 26.0–34.0)
MCHC: 32.8 g/dL (ref 30.0–36.0)
MCV: 86.8 fL (ref 80.0–100.0)
Monocytes Absolute: 0.5 10*3/uL (ref 0.1–1.0)
Monocytes Relative: 8 %
Neutro Abs: 4.5 10*3/uL (ref 1.7–7.7)
Neutrophils Relative %: 66 %
Platelets: 327 10*3/uL (ref 150–400)
RBC: 4.25 MIL/uL (ref 3.87–5.11)
RDW: 14.7 % (ref 11.5–15.5)
WBC: 6.8 10*3/uL (ref 4.0–10.5)
nRBC: 0 % (ref 0.0–0.2)

## 2023-07-07 LAB — COMPREHENSIVE METABOLIC PANEL WITH GFR
ALT: 13 U/L (ref 0–44)
AST: 25 U/L (ref 15–41)
Albumin: 3.6 g/dL (ref 3.5–5.0)
Alkaline Phosphatase: 42 U/L (ref 38–126)
Anion gap: 11 (ref 5–15)
BUN: 14 mg/dL (ref 8–23)
CO2: 24 mmol/L (ref 22–32)
Calcium: 9.1 mg/dL (ref 8.9–10.3)
Chloride: 105 mmol/L (ref 98–111)
Creatinine, Ser: 0.66 mg/dL (ref 0.44–1.00)
GFR, Estimated: >60 mL/min (ref >60)
Glucose, Bld: 118 mg/dL — ABNORMAL HIGH (ref 70–99)
Potassium: 4.4 mmol/L (ref 3.5–5.1)
Sodium: 140 mmol/L (ref 135–145)
Total Bilirubin: 1.3 mg/dL — ABNORMAL HIGH (ref 0.0–1.2)
Total Protein: 6.3 g/dL — ABNORMAL LOW (ref 6.5–8.1)

## 2023-07-07 LAB — MAGNESIUM: Magnesium: 2.2 mg/dL (ref 1.7–2.4)

## 2023-07-07 LAB — PHOSPHORUS: Phosphorus: 3.7 mg/dL (ref 2.5–4.6)

## 2023-07-07 LAB — GLUCOSE, CAPILLARY
Glucose-Capillary: 110 mg/dL — ABNORMAL HIGH (ref 70–99)
Glucose-Capillary: 109 mg/dL — ABNORMAL HIGH (ref 70–99)
Glucose-Capillary: 81 mg/dL (ref 70–99)
Glucose-Capillary: 148 mg/dL — ABNORMAL HIGH (ref 70–99)

## 2023-07-07 SURGERY — PACEMAKER IMPLANT

## 2023-07-07 MED ORDER — LIDOCAINE HCL 1 % IJ SOLN
INTRAMUSCULAR | Status: AC
  Filled 2023-07-07: qty 60

## 2023-07-07 MED ORDER — LIDOCAINE HCL (PF) 1 % IJ SOLN
INTRAMUSCULAR | Status: DC | PRN
  Administered 2023-07-07: 60 mL

## 2023-07-07 MED ORDER — CEFAZOLIN SODIUM-DEXTROSE 2-4 GM/100ML-% IV SOLN
INTRAVENOUS | Status: AC
  Filled 2023-07-07: qty 100

## 2023-07-07 MED ORDER — METOPROLOL SUCCINATE ER 25 MG PO TB24
25.0000 mg | ORAL_TABLET | Freq: Every day | ORAL | Status: DC
  Administered 2023-07-07 – 2023-07-08 (×2): 25 mg via ORAL
  Filled 2023-07-07 (×2): qty 1

## 2023-07-07 MED ORDER — HEPARIN (PORCINE) IN NACL 1000-0.9 UT/500ML-% IV SOLN
INTRAVENOUS | Status: DC | PRN
  Administered 2023-07-07: 500 mL

## 2023-07-07 MED ORDER — CHLORHEXIDINE GLUCONATE 4 % EX SOLN
CUTANEOUS | Status: AC
  Administered 2023-07-07: 4 via TOPICAL
  Filled 2023-07-07: qty 15

## 2023-07-07 MED ORDER — FENTANYL CITRATE (PF) 100 MCG/2ML IJ SOLN
INTRAMUSCULAR | Status: DC | PRN
  Administered 2023-07-07 (×3): 25 ug via INTRAVENOUS

## 2023-07-07 MED ORDER — MIDAZOLAM HCL 2 MG/2ML IJ SOLN
INTRAMUSCULAR | Status: AC
  Filled 2023-07-07: qty 2

## 2023-07-07 MED ORDER — SODIUM CHLORIDE 0.9 % IV SOLN
INTRAVENOUS | Status: AC
  Filled 2023-07-07: qty 2

## 2023-07-07 MED ORDER — MIDAZOLAM HCL 5 MG/5ML IJ SOLN
INTRAMUSCULAR | Status: DC | PRN
  Administered 2023-07-07 (×3): 1 mg via INTRAVENOUS

## 2023-07-07 MED ORDER — FENTANYL CITRATE (PF) 100 MCG/2ML IJ SOLN
INTRAMUSCULAR | Status: AC
  Filled 2023-07-07: qty 2

## 2023-07-07 SURGICAL SUPPLY — 13 items
CABLE SURGICAL S-101-97-12 (CABLE) ×1 IMPLANT
CATH CPS LOCATOR 3D MED (CATHETERS) IMPLANT
LEAD ULTIPACE 52 LPA1231/52 (Lead) IMPLANT
LEAD ULTIPACE 65 LPA1231/65 (Lead) IMPLANT
PACEMAKER ASSURITY DR-RF (Pacemaker) IMPLANT
PAD DEFIB RADIO PHYSIO CONN (PAD) ×1 IMPLANT
SHEATH 7FR PRELUDE SNAP 13 (SHEATH) IMPLANT
SHEATH 9FR PRELUDE SNAP 13 (SHEATH) IMPLANT
SHEATH PROBE COVER 6X72 (BAG) IMPLANT
SLITTER AGILIS HISPRO (INSTRUMENTS) IMPLANT
TOOL HELIX LOCKING (MISCELLANEOUS) IMPLANT
TRAY PACEMAKER INSERTION (PACKS) ×1 IMPLANT
WIRE HI TORQ VERSACORE-J 145CM (WIRE) IMPLANT

## 2023-07-07 NOTE — Progress Notes (Signed)
 Mobility Specialist Progress Note;   07/07/23 1039  Mobility  Activity Ambulated independently in hallway  Level of Assistance Standby assist, set-up cues, supervision of patient - no hands on  Assistive Device None  Distance Ambulated (ft) 400 ft  Activity Response Tolerated well  Mobility Referral Yes  Mobility visit 1 Mobility  Mobility Specialist Start Time (ACUTE ONLY) 1039  Mobility Specialist Stop Time (ACUTE ONLY) 1046  Mobility Specialist Time Calculation (min) (ACUTE ONLY) 7 min    Pre-mobility: HR 83 bpm During-mobility: HR 94 bpm  Pt eager for mobility. Required no physical assistance during ambulation, SV. HR up to 124 bpm w/ activity. No c/o when asked. Pt returned back to room with all needs met. Husband in room.   Janit Meline Mobility Specialist Please contact via SecureChat or [employer] [phone]

## 2023-07-07 NOTE — Progress Notes (Signed)
 Patient had a 30beat run of NSVT just prior to leaving the floor for pacing She felt a flutter but no symptoms otherwise Reviewed with Dr. Lawana Pray Given no significant CAD, preserved LVEF > PPM with the addition of BB. Will start Toprol 25mg  daily  EP follow up is in place Care instructions are in her AVS  Resumption of Plavix  to be determined POD #1 pending pocket stabilty  Mertha Abrahams, PA-C

## 2023-07-07 NOTE — Discharge Instructions (Signed)
 After Your Pacemaker   You have a [employer] Pacemaker  ACTIVITY Do not lift your arm above shoulder height for 1 week after your procedure. After 7 days, you may progress as below.  You should remove your sling 24 hours after your procedure, unless otherwise instructed by your provider.     Friday Jul 14, 2023  Saturday Jul 15, 2023 Sunday Jul 16, 2023 Monday Jul 17, 2023   Do not lift, push, pull, or carry anything over 10 pounds with the affected arm until 6 weeks (Friday August 18, 2023 ) after your procedure.   You may drive AFTER your wound check, unless you have been told otherwise by your provider.   Ask your healthcare provider when you can go back to work   INCISION/Dressing If you are on a blood thinner such as Coumadin, Xarelto, Eliquis, Plavix , or Pradaxa please confirm with your provider when this should be resumed.   If large square, outer bandage is left in place, this can be removed after 24 hours from your procedure. Do not remove steri-strips or glue as below.   If a PRESSURE DRESSING (a bulky dressing that usually goes up over your shoulder) was applied or left in place, please follow instructions given by your provider on when to return to have this removed.   Monitor your Pacemaker site for redness, swelling, and drainage. Call the device clinic at [phone] if you experience these symptoms or fever/chills.  If your incision is sealed with Steri-strips or staples, you may shower 7 days after your procedure or when told by your provider. Do not remove the steri-strips or let the shower hit directly on your site. You may wash around your site with soap and water.    If you were discharged in a sling, please do not wear this during the day more than 48 hours after your surgery unless otherwise instructed. This may increase the risk of stiffness and soreness in your shoulder.   Avoid lotions, ointments, or perfumes over your incision until it is well-healed.  You may  use a hot tub or a pool AFTER your wound check appointment if the incision is completely closed.  Pacemaker Alerts:  Some alerts are vibratory and others beep. These are NOT emergencies. Please call our office to let us  know. If this occurs at night or on weekends, it can wait until the next business day. Send a remote transmission.  If your device is capable of reading fluid status (for heart failure), you will be offered monthly monitoring to review this with you.   DEVICE MANAGEMENT Remote monitoring is used to monitor your pacemaker from home. This monitoring is scheduled every 91 days by our office. It allows us  to keep an eye on the functioning of your device to ensure it is working properly. You will routinely see your Electrophysiologist annually (more often if necessary).   You should receive your ID card for your new device in 4-8 weeks. Keep this card with you at all times once received. Consider wearing a medical alert bracelet or necklace.  Your Pacemaker may be MRI compatible. This will be discussed at your next office visit/wound check.  You should avoid contact with strong electric or magnetic fields.   Do not use amateur (ham) radio equipment or electric (arc) welding torches. MP3 player headphones with magnets should not be used. Some devices are safe to use if held at least 12 inches (30 cm) from your Pacemaker. These include power tools, lawn  mowers, and speakers. If you are unsure if something is safe to use, ask your health care provider.  When using your cell phone, hold it to the ear that is on the opposite side from the Pacemaker. Do not leave your cell phone in a pocket over the Pacemaker.  You may safely use electric blankets, heating pads, computers, and microwave ovens.  Call the office right away if: You have chest pain. You feel more short of breath than you have felt before. You feel more light-headed than you have felt before. Your incision starts to open  up.  This information is not intended to replace advice given to you by your health care provider. Make sure you discuss any questions you have with your health care provider.

## 2023-07-07 NOTE — Progress Notes (Addendum)
 Rounding Note    Patient Name: Jasmin [name] Date of Encounter: 07/07/2023  [facility] Cardiologist: Ralene Burger, MD   Subjective   Observed ambulating in the hallway, doing great, having no symptoms  Inpatient Medications    Scheduled Meds:  aspirin  EC  81 mg Oral Daily   cefadroxil  500 mg Oral BID   chlorhexidine  60 mL Topical Once   cycloSPORINE   1 drop Both Eyes BID   desmopressin   0.2 mg Oral QHS   fesoterodine  4 mg Oral Daily   fluticasone   2 spray Each Nare Daily   furosemide  20 mg Oral Daily   gentamicin (GARAMYCIN) 80 mg in sodium chloride 0.9 % 500 mL irrigation  80 mg Irrigation On Call   insulin  aspart  0-9 Units Subcutaneous TID WC   losartan   75 mg Oral Daily   mirabegron  ER  50 mg Oral Daily   omega-3 acid ethyl esters  2 g Oral BID   pantoprazole   40 mg Oral BID   polyethylene glycol  17 g Oral BID   simvastatin   20 mg Oral QHS   sodium chloride flush  3 mL Intravenous Q12H   sodium chloride flush  3 mL Intravenous Q12H   sodium chloride flush  3-10 mL Intravenous Q12H   Continuous Infusions:  sodium chloride Stopped (07/06/23 2225)    ceFAZolin  (ANCEF ) IV     PRN Meds: acetaminophen , hydrocortisone cream, ondansetron  (ZOFRAN ) IV, oxyCODONE , Polyethyl Glycol-Propyl Glycol, sodium chloride flush, sodium chloride flush, sodium chloride flush   Vital Signs    Vitals:   07/07/23 0500 07/07/23 0527 07/07/23 0600 07/07/23 0713  BP: (!) 139/56   121/62  Pulse:    70  Resp:    17  Temp:    98.2 F (36.8 C)  TempSrc:    Oral  SpO2: 98% 96% 95% 96%  Weight:      Height:        Intake/Output Summary (Last 24 hours) at 07/07/2023 1047 Last data filed at 07/07/2023 0500 Gross per 24 hour  Intake 490 ml  Output --  Net 490 ml      07/07/2023    4:56 AM 07/06/2023    5:04 AM 07/05/2023    4:35 AM  Last 3 Weights  Weight (lbs) 241 lb 4.8 oz 243 lb 4.8 oz 241 lb 14.4 oz  Weight (kg) 109.453 kg 110.36 kg 109.725 kg       Telemetry    unchanged SR, 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  Block burden is less, not resolved  - Personally Reviewed  ECG    No new EKGs  - Personally Reviewed  Physical Exam   unchanged GEN: No acute distress.   Neck: No JVD Cardiac: RRR, no murmurs, rubs, or gallops.  Respiratory: CTA b/l. GI: Soft, nontender, non-distended  MS: No edema; No deformity. Neuro:  Nonfocal  Psych: Normal affect   Labs    High Sensitivity Troponin:   Recent Labs  Lab 07/03/23 0748 07/03/23 1220  TROPONINIHS 12 14     Chemistry Recent Labs  Lab 07/02/23 1812 07/03/23 0157 07/05/23 0437 07/06/23 0515 07/07/23 0800  NA 137   < > 139 139 140  K 4.0   < > 3.8 4.2 4.4  CL 100   < > 107 108 105  CO2 23   < > 23 21* 24  GLUCOSE 174*   < > 138* 125* 118*  BUN 18   < > 19 19 14   CREATININE 0.75   < > 0.76 0.72 0.66  CALCIUM  9.4   < > 8.3* 8.9 9.1  MG  --    < > 2.0 2.1 2.2  PROT 6.7  --   --  5.6* 6.3*  ALBUMIN 4.1  --   --  3.2* 3.6  AST 24  --   --  24 25  ALT 21  --   --  13 13  ALKPHOS 63  --   --  40 42  BILITOT 0.5  --   --  1.0 1.3*  GFRNONAA >60   < > >60 >60 >60  ANIONGAP 14   < > 9 10 11    < > = values in this interval not displayed.    Lipids  Recent Labs  Lab 07/03/23 0748  CHOL 127  TRIG 134  HDL 36*  LDLCALC 64  CHOLHDL 3.5    Hematology Recent Labs  Lab 07/05/23 0437 07/06/23 0515 07/07/23 0800  WBC 7.6 7.6 6.8  RBC 3.86* 4.08 4.25  HGB 10.9* 11.6* 12.1  HCT 34.0* 35.9* 36.9  MCV 88.1 88.0 86.8  MCH 28.2 28.4 28.5  MCHC 32.1 32.3 32.8  RDW 15.1 14.9 14.7  PLT 280 287 327   Thyroid   Recent Labs  Lab 07/03/23 0653  TSH 2.269    BNP Recent Labs  Lab 07/03/23 0045  PROBNP 2,556.0*    DDimer  Recent Labs  Lab 07/03/23 0045  DDIMER 0.40     Radiology      Cardiac Studies   echo this admission with LVEF 60-65%, grade II DD, mod AS  R/LHC  Angiographically normal coronary arteries with a codominant  system Very tortuous innominate artery making it very difficult to engage the RCA-nonselective images obtained. Relatively Normal Right Heart Cath Numbers: RAP mean 8 mmHg; RV P-EDP 37/0-13 mmHg; PAP-mean 38/21-22 mmHg with a PCWP of 24 mmHg. AO P-MAP 130/39-65 mmHg; LV P-EDP 164/13-19 mmHg. Ao sat 93%, PA sat 63%.  Cardiac output-index That (Fick) 5.57-2.74. ~Moderate aortic stenosis with mean AVG estimated 21 mmHg  Patient Profile     78 y.o. 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 This did seem to get better over the weekend but noted an unusual headache She thought perhaps was from Ozempic  a fairly new med for her Monday however she started to feel weak, poorly, abdominal fullness/discomfort and again easily winded and saught attention At [facility] ER she was noted in 2:1 block and transferred to [facility] for further evaluation She had no near syncope or syncope. Denied feeling lightheaded, dizzy.  Assessment & Plan    Symptomatic bradycardia Acute reduction in exertional capacity Baseline RBBB No nodal blocking agents noted She is on Plavix  for hx of stroke and got it 07/05/23   With less then obvious symptoms, particularly currently in 2:1 and feels "great".  We had her walk in the hall yesterday 5/14, her sinus rate increased (as did her V rate) though she continued in 2:1 AV block with no improvement in her AV conduction. Given this, Dr. Arlester Ladd did recommend PPM, though was not an emergency and given  her Plavix  deferred to next available lab time.  The last 24hours conduction has improved, last time her HRs was in the 40's (nothing below that) was ~0600.  She has been 1:1 conducting this morning  consistently Remains unchanged  On today's schedule with Dr. Lawana Pray for PPM He has seen her this morning, she remains agreeable  E.coli UTI On ABX via attending/IM team    For questions or updates, please contact [facility] Please consult [email or web address] for contact info under        Signed, Debbie Fails, PA-C  07/07/2023, 10:47 AM    I have seen and examined this patient with Jasmin [name].  Agree with above, note added to reflect my findings.  Feeling well today without complaints  GEN: No acute distress.   Neck: No JVD Cardiac: RRR, no murmurs, rubs, or gallops.  Respiratory: normal BS bases bilaterally. GI: Soft, nontender, non-distended  MS: No edema; No deformity. 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  Patient Jasmin [name] require pacemaker implant.  Risks and benefits have been discussed.  She understands the risks and is agreed to the procedure. Explained risks, benefits, and alternatives to PPM implantation, including but not limited to bleeding, infection, pneumothorax, pericardial effusion, lead dislodgement, heart attack, stroke, or death.  Pt verbalized understanding and agrees to proceed.   Eulis Salazar M. Launi Asencio MD 07/07/2023 1:58 PM

## 2023-07-07 NOTE — Progress Notes (Signed)
 PROGRESS NOTE    Jasmin [name]  ZOX:[id number] DOB: 02-20-1946 DOA: 07/02/2023 PCP: [name], MD  Chief Complaint  Patient presents with   Shortness of Breath    Brief Narrative:   Patient 78 year old female history of type 2 diabetes, hypertension, sleep apnea presented to the ED with increasing dyspnea on exertion which she noticed after increased dose of Ozempic . Patient with dyspnea on minimal exertion. Patient seen in the ED noted to be bradycardic with [name] 2-1 AV block with prolonged PR up to 450 MS. It was noted that while ambulating in the ER patient's heart rate did go up into the 70s. Patient admitted for further evaluation. Cardiology consulted.   Assessment & Plan:   Principal Problem:   Bradycardia Active Problems:   Mixed hyperlipidemia   Essential hypertension   OSA on CPAP   Diabetes mellitus without complication (HCC)   UTI (urinary tract infection)   Symptomatic bradycardia   2nd degree AV block   CAD in native artery   Acute on chronic heart failure (HCC)   Morbid obesity (HCC)  2:1 AV block Symptomatic Bradycardia Appreciate cards assistance S/p L/RHC with angiographically normal coronary arteries - consider nonischemic etiology for patient's symptoms, eval bradycardia Now s/p pacemaker implantation  NSVT metop  Aortic Valve Stenosis Cards follow up  Dyslipidemia Simvastatin   Hypertension losartan  Hx CVA Aspirin , plavix  (currently on hold) OSA Nightly CPAP  T2DM SSI On metformin  prior to admission Unlikely ozempic  related based on her symptoms  E. Coli UTI Narrow to duricef     DVT prophylaxis: SCD Code Status: full Family Communication: husband Disposition:   Status is: Inpatient Remains inpatient appropriate because: cards consult   Consultants:  cardiology  Procedures:  1. Pacemaker implantation.   L/RHC POST-OPERATIVE DIAGNOSIS:   Angiographically normal coronary arteries with Issabella Rix codominant system Very  tortuous innominate artery making it very difficult to engage the RCA-nonselective images obtained. Relatively Normal Right Heart Cath Numbers: RAP mean 8 mmHg; RV P-EDP 37/0-13 mmHg; PAP-mean 38/21-22 mmHg with [name] PCWP of 24 mmHg. AO P-MAP 130/39-65 mmHg; LV P-EDP 164/13-19 mmHg. Ao sat 93%, PA sat 63%.  Cardiac output-index That (Fick) 5.57-2.74. ~Moderate aortic stenosis with mean AVG estimated 21 mmHg   PLAN OF CARE: Return to nursing unit for ongoing care and continued management.  .  Anticipated discharge date to be determined.   Consider nonischemic etiology for the patient's symptoms. Likely need to evaluate bradycardia.  Echo IMPRESSIONS     1. Left ventricular ejection fraction, by estimation, is 60 to 65%. The  left ventricle has normal function. The left ventricle has no regional  wall motion abnormalities. There is moderate asymmetric left ventricular  hypertrophy of the basal-septal  segment. Left ventricular diastolic parameters are consistent with Grade  II diastolic dysfunction (pseudonormalization).   2. Right ventricular systolic function is normal. The right ventricular  size is normal. Tricuspid regurgitation signal is inadequate for assessing  PA pressure.   3. Left atrial size was mildly dilated.   4. The mitral valve is degenerative. Trivial mitral valve regurgitation.  No evidence of mitral stenosis. Moderate mitral annular calcification.   5. The aortic valve is tricuspid. There is moderate calcification of the  aortic valve. Aortic valve regurgitation is mild. Moderate aortic valve  stenosis. Aortic valve mean gradient measures 23.0 mmHg, AVA 1.38 cm^2.   6. Aortic dilatation noted. There is mild dilatation of the ascending  aorta, measuring 40 mm.   7. The inferior vena cava is  dilated in size with >50% respiratory  variability, suggesting right atrial pressure of 8 mmHg.   Antimicrobials:  Anti-infectives (From admission, onward)    Start      Dose/Rate Route Frequency Ordered Stop   07/07/23 1341  sodium chloride 0.9 % with gentamicin (GARAMYCIN) ADS Med       Note to Pharmacy: Abran Hoh W: cabinet override      07/07/23 1341 07/08/23 0144   07/07/23 1341  ceFAZolin  (ANCEF ) 2-4 GM/100ML-% IVPB       Note to Pharmacy: Abran Hoh W: cabinet override      07/07/23 1341 07/08/23 0144   07/07/23 0600  gentamicin (GARAMYCIN) 80 mg in sodium chloride 0.9 % 500 mL irrigation        80 mg Irrigation On call 07/06/23 1809 07/07/23 1521   07/07/23 0600  ceFAZolin  (ANCEF ) IVPB 2g/100 mL premix        2 g 200 mL/hr over 30 Minutes Intravenous On call 07/06/23 1809 07/07/23 1517   07/05/23 2200  cefadroxil (DURICEF) capsule 500 mg        500 mg Oral 2 times daily 07/05/23 2036     07/04/23 0400  cefTRIAXone (ROCEPHIN) 2 g in sodium chloride 0.9 % 100 mL IVPB  Status:  Discontinued        2 g 200 mL/hr over 30 Minutes Intravenous Every 24 hours 07/03/23 0745 07/05/23 2036   07/03/23 0415  cefTRIAXone (ROCEPHIN) 1 g in sodium chloride 0.9 % 100 mL IVPB        1 g 200 mL/hr over 30 Minutes Intravenous  Once 07/03/23 0413 07/03/23 0455       Subjective: No complaints   Objective: Vitals:   07/07/23 1529 07/07/23 1534 07/07/23 1610 07/07/23 1829  BP: 118/65 125/61 (!) 89/71 (!) 163/75  Pulse: 72 74 71 69  Resp: 12 17 17    Temp:   97.8 F (36.6 C)   TempSrc:   Oral   SpO2: 97% 95% 96%   Weight:      Height:        Intake/Output Summary (Last 24 hours) at 07/07/2023 1936 Last data filed at 07/07/2023 0500 Gross per 24 hour  Intake 250 ml  Output --  Net 250 ml   Filed Weights   07/05/23 0435 07/06/23 0504 07/07/23 0456  Weight: 109.7 kg 110.4 kg 109.5 kg    Examination:  General: No acute distress. Cardiovascular: RRR - pacemaker dressing with some bleed through on dressing Lungs: unlabored Neurological: Alert and oriented 3. Moves all extremities 4 with equal strength. Cranial nerves II through XII  grossly intact. Extremities: No clubbing or cyanosis. No edema.  Data Reviewed: I have personally reviewed following labs and imaging studies  CBC: Recent Labs  Lab 07/02/23 1812 07/03/23 0748 07/04/23 0427 07/04/23 1434 07/04/23 1531 07/04/23 2028 07/05/23 0437 07/06/23 0515 07/07/23 0800  WBC 11.3* 11.0* 9.4  --   --  9.1 7.6 7.6 6.8  NEUTROABS 7.6 9.0* 6.6  --   --   --   --  4.8 4.5  HGB 12.3 12.4 11.9*   < > 12.2 12.9 10.9* 11.6* 12.1  HCT 37.4 39.1 36.1   < > 36.0 39.6 34.0* 35.9* 36.9  MCV 87.2 91.4 87.0  --   --  87.8 88.1 88.0 86.8  PLT 305 276 273  --   --  304 280 287 327   < > = values in this interval not displayed.    Basic  Metabolic Panel: Recent Labs  Lab 07/03/23 0157 07/03/23 0748 07/04/23 0427 07/04/23 1434 07/04/23 1526 07/04/23 1531 07/04/23 2028 07/05/23 0437 07/06/23 0515 07/07/23 0800  NA  --  139 139   < > 141  141 140  --  139 139 140  K  --  3.9 4.2   < > 3.8  3.8 3.8  --  3.8 4.2 4.4  CL  --  105 103  --   --   --   --  107 108 105  CO2  --  21* 25  --   --   --   --  23 21* 24  GLUCOSE  --  136* 134*  --   --   --   --  138* 125* 118*  BUN  --  13 22  --   --   --   --  19 19 14   CREATININE  --  0.62 0.81  --   --   --  0.77 0.76 0.72 0.66  CALCIUM   --  9.0 9.2  --   --   --   --  8.3* 8.9 9.1  MG 2.0  --  2.0  --   --   --   --  2.0 2.1 2.2  PHOS  --   --   --   --   --   --   --   --  4.0 3.7   < > = values in this interval not displayed.    GFR: Estimated Creatinine Clearance: 66.3 mL/min (by C-G formula based on SCr of 0.66 mg/dL).  Liver Function Tests: Recent Labs  Lab 07/02/23 1812 07/06/23 0515 07/07/23 0800  AST 24 24 25   ALT 21 13 13   ALKPHOS 63 40 42  BILITOT 0.5 1.0 1.3*  PROT 6.7 5.6* 6.3*  ALBUMIN 4.1 3.2* 3.6    CBG: Recent Labs  Lab 07/06/23 1651 07/06/23 2106 07/07/23 0739 07/07/23 1210 07/07/23 1643  GLUCAP 140* 137* 110* 109* 81     Recent Results (from the past 240 hours)  Resp panel  by RT-PCR (RSV, Flu Hien Perreira&B, Covid) Anterior Nasal Swab     Status: None   Collection Time: 07/03/23 12:27 AM   Specimen: Anterior Nasal Swab  Result Value Ref Range Status   SARS Coronavirus 2 by RT PCR NEGATIVE NEGATIVE Final    Comment: (NOTE) SARS-CoV-2 target nucleic acids are NOT DETECTED.  The SARS-CoV-2 RNA is generally detectable in upper respiratory specimens during the acute phase of infection. The lowest concentration of SARS-CoV-2 viral copies this assay can detect is 138 copies/mL. Visente Kirker negative result does not preclude SARS-Cov-2 infection and should not be used as the sole basis for treatment or other patient management decisions. Fransico Sciandra negative result may occur with  improper specimen collection/handling, submission of specimen other than nasopharyngeal swab, presence of viral mutation(s) within the areas targeted by this assay, and inadequate number of viral copies(<138 copies/mL). Trevious Rampey negative result must be combined with clinical observations, patient history, and epidemiological information. The expected result is Negative.  Fact Sheet for Patients:  BloggerCourse.com  Fact Sheet for Healthcare Providers:  SeriousBroker.it  This test is no t yet approved or cleared by the [place]  FDA and  has been authorized for detection and/or diagnosis of SARS-CoV-2 by FDA under an Emergency Use Authorization (EUA). This EUA will remain  in effect (meaning this test can be used) for the duration of the COVID-19 declaration under Section 564(b)(1) of  the Act, 21 U.S.C.section 360bbb-3(b)(1), unless the authorization is terminated  or revoked sooner.       Influenza Mykah Shin by PCR NEGATIVE NEGATIVE Final   Influenza B by PCR NEGATIVE NEGATIVE Final    Comment: (NOTE) The Xpert Xpress SARS-CoV-2/FLU/RSV plus assay is intended as an aid in the diagnosis of influenza from Nasopharyngeal swab specimens and should not be used as Tenesha Garza sole  basis for treatment. Nasal washings and aspirates are unacceptable for Xpert Xpress SARS-CoV-2/FLU/RSV testing.  Fact Sheet for Patients: BloggerCourse.com  Fact Sheet for Healthcare Providers: SeriousBroker.it  This test is not yet approved or cleared by the [place]  FDA and has been authorized for detection and/or diagnosis of SARS-CoV-2 by FDA under an Emergency Use Authorization (EUA). This EUA will remain in effect (meaning this test can be used) for the duration of the COVID-19 declaration under Section 564(b)(1) of the Act, 21 U.S.C. section 360bbb-3(b)(1), unless the authorization is terminated or revoked.     Resp Syncytial Virus by PCR NEGATIVE NEGATIVE Final    Comment: (NOTE) Fact Sheet for Patients: BloggerCourse.com  Fact Sheet for Healthcare Providers: SeriousBroker.it  This test is not yet approved or cleared by the [place]  FDA and has been authorized for detection and/or diagnosis of SARS-CoV-2 by FDA under an Emergency Use Authorization (EUA). This EUA will remain in effect (meaning this test can be used) for the duration of the COVID-19 declaration under Section 564(b)(1) of the Act, 21 U.S.C. section 360bbb-3(b)(1), unless the authorization is terminated or revoked.  Performed at [facility], [address] [place], [place] [address]   Urine Culture     Status: Abnormal   Collection Time: 07/03/23  1:35 AM   Specimen: Urine, Clean Catch  Result Value Ref Range Status   Specimen Description   Final    URINE, CLEAN CATCH Performed at [facility], [address] [place], [place] [address]    Special Requests   Final    NONE Performed at [facility], [address] [place], [place] [address]    Culture >=100,000 COLONIES/mL ESCHERICHIA COLI (Isaura Schiller)  Final   Report Status 07/05/2023 FINAL  Final   Organism  ID, Bacteria ESCHERICHIA COLI (Kennah Hehr)  Final      Susceptibility   Escherichia coli - MIC*    AMPICILLIN >=32 RESISTANT Resistant     CEFAZOLIN  <=4 SENSITIVE Sensitive     CEFEPIME <=0.12 SENSITIVE Sensitive     CEFTRIAXONE <=0.25 SENSITIVE Sensitive     CIPROFLOXACIN <=0.25 SENSITIVE Sensitive     GENTAMICIN <=1 SENSITIVE Sensitive     IMIPENEM 1 SENSITIVE Sensitive     NITROFURANTOIN  <=16 SENSITIVE Sensitive     TRIMETH /SULFA  <=20 SENSITIVE Sensitive     AMPICILLIN/SULBACTAM 16 INTERMEDIATE Intermediate     PIP/TAZO <=4 SENSITIVE Sensitive ug/mL    * >=100,000 COLONIES/mL ESCHERICHIA COLI  Surgical PCR screen     Status: None   Collection Time: 07/05/23  3:14 PM   Specimen: Nasal Mucosa; Nasal Swab  Result Value Ref Range Status   MRSA, PCR NEGATIVE NEGATIVE Final   Staphylococcus aureus NEGATIVE NEGATIVE Final    Comment: (NOTE) The Xpert SA Assay (FDA approved for NASAL specimens in patients 4 years of age and older), is one component of Amiir Heckard comprehensive surveillance program. It is not intended to diagnose infection nor to guide or monitor treatment. Performed at [facility], [address], [place], [place] [address]  Radiology Studies: EP PPM/ICD IMPLANT Result Date: 07/07/2023 SURGEON:  Agatha Horsfall, MD   PREPROCEDURE DIAGNOSIS:  intermittent 2:1 AV block   POSTPROCEDURE DIAGNOSIS:  intermittent 2:1 AV block    PROCEDURES:  1. Pacemaker implantation.   INTRODUCTION:  Aidah Holiman is Ariyona Eid 78 y.o. female with Ryla Cauthon history of bradycardia who presents today for pacemaker implantation.  The patient reports intermittent episodes of dizziness over the past few months.  No reversible causes have been identified.  The patient therefore presents today for pacemaker implantation.   DESCRIPTION OF PROCEDURE:  Informed written consent was obtained, and  the patient was brought to the electrophysiology lab in Reverie Vaquera fasting state.  The patient required no sedation for the procedure  today.  The patients left chest was prepped and draped in the usual sterile fashion by the EP lab staff. The skin overlying the left deltopectoral region was infiltrated with lidocaine for local analgesia.  Kinley Ferrentino 4-cm incision was made over the left deltopectoral region.  Aarin Bluett left subcutaneous pacemaker pocket was fashioned using Ashleigh Arya combination of sharp and blunt dissection. Electrocautery was required to assure hemostasis.  RA/RV Lead Placement: The left axillary vein was therefore cannulated.  Through the left axillary vein, Leonard Hendler Abbott Ultipace 1231-52  (serial number  [id number]) right atrial lead and an Abbott Ultipace 1231-65 (serial number  [id number]) right ventricular lead were advanced with fluoroscopic visualization into the right atrial appendage and left bundle area positions respectively.  Initial atrial lead P- waves measured 3.9 mV with impedance of 450 ohms and Kinsey Cowsert threshold of 0.75 V at 0.5 msec.  Right ventricular lead R-waves measured 7.5 mV with an impedance of 710 ohms and Leslea Vowles threshold of 0.75 V at 0.5 msec.  Both leads were secured to the pectoralis fascia using #2-0 silk over the suture sleeves. Device Placement:  The leads were then connected to an Abbott Assurity [id number]  (serial number  [id number] ) pacemaker.  The pocket was irrigated with copious gentamicin solution.  The pacemaker was then placed into the pocket.  The pocket was then closed in 3 layers with 2.0 and 3.0 V-Loc suture for the subcutaneous layers and 3.0 Vicryl suture for the subcuticular layers.  Steri-  Strips and Zaylia Riolo sterile dressing were then applied. EBL<31ml.  There were no early apparent complications.   CONCLUSIONS:  1. Successful implantation of Hollis Oh Abbott Assurity [id number] dual-chamber pacemaker for symptomatic bradycardia  2. No early apparent complications.   Will Lawana Pray, MD 07/07/2023 3:34 PM        Scheduled Meds:  aspirin  EC  81 mg Oral Daily   cefadroxil  500 mg Oral BID   cycloSPORINE   1 drop Both Eyes BID    desmopressin   0.2 mg Oral QHS   fesoterodine  4 mg Oral Daily   fluticasone   2 spray Each Nare Daily   furosemide  20 mg Oral Daily   insulin  aspart  0-9 Units Subcutaneous TID WC   losartan   75 mg Oral Daily   metoprolol succinate  25 mg Oral Daily   mirabegron  ER  50 mg Oral Daily   omega-3 acid ethyl esters  2 g Oral BID   pantoprazole   40 mg Oral BID   polyethylene glycol  17 g Oral BID   simvastatin   20 mg Oral QHS   sodium chloride 0.9 % with gentamicin (GARAMYCIN) ADS Med       sodium chloride flush  3 mL Intravenous Q12H   sodium chloride flush  3 mL Intravenous  Q12H   Continuous Infusions:  ceFAZolin         LOS: 4 days    Time spent: over 30 min     Donnetta Gains, MD [facility]   To contact the attending provider between 7A-7P or the covering provider during after hours 7P-7A, please log into the web site [email or web address] and access using universal [place] password for that web site. If you do not have the password, please call the hospital operator.  07/07/2023, 7:36 PM

## 2023-07-08 ENCOUNTER — Inpatient Hospital Stay (HOSPITAL_COMMUNITY)

## 2023-07-08 ENCOUNTER — Other Ambulatory Visit (HOSPITAL_COMMUNITY): Payer: Self-pay

## 2023-07-08 DIAGNOSIS — R001 Bradycardia, unspecified: Secondary | ICD-10-CM | POA: Diagnosis not present

## 2023-07-08 DIAGNOSIS — I441 Atrioventricular block, second degree: Secondary | ICD-10-CM | POA: Diagnosis not present

## 2023-07-08 LAB — GLUCOSE, CAPILLARY: Glucose-Capillary: 127 mg/dL — ABNORMAL HIGH (ref 70–99)

## 2023-07-08 MED ORDER — METOPROLOL SUCCINATE ER 25 MG PO TB24
25.0000 mg | ORAL_TABLET | Freq: Every day | ORAL | 1 refills | Status: DC
  Filled 2023-07-08: qty 30, 30d supply, fill #0

## 2023-07-08 MED ORDER — FUROSEMIDE 20 MG PO TABS
20.0000 mg | ORAL_TABLET | Freq: Every day | ORAL | 1 refills | Status: DC
  Filled 2023-07-08: qty 30, 30d supply, fill #0

## 2023-07-08 MED ORDER — CEFADROXIL 500 MG PO CAPS
500.0000 mg | ORAL_CAPSULE | Freq: Two times a day (BID) | ORAL | 0 refills | Status: AC
  Filled 2023-07-08: qty 1, 1d supply, fill #0

## 2023-07-08 NOTE — Discharge Summary (Addendum)
 Physician Discharge Summary  Jasmin [name] UJW:[id number] DOB: [birth year] DOA: 07/02/2023  PCP: Jasmin Partridge, MD  Admit date: 07/02/2023 Discharge date: 07/08/2023  Time spent: 40 minutes  Recommendations for Outpatient Follow-up:  Follow outpatient CBC/CMP  Follow with cards after pacemaker placement Follow moderate AS with cards Follow volume, started on lasix  Follow with PCP outpatient to resume ozempic  (not suspected as cause of presenting sx, but will defer resumption to outpatient setting)   Discharge Diagnoses:  Principal Problem:   Bradycardia Active Problems:   Mixed hyperlipidemia   Essential hypertension   OSA on CPAP   Diabetes mellitus without complication (HCC)   UTI (urinary tract infection)   Symptomatic bradycardia   2nd degree AV block   CAD in native artery   Acute on chronic heart failure (HCC)   Morbid obesity (HCC)   Discharge Condition: stable  Diet recommendation: heart healthy  Filed Weights   07/06/23 0504 07/07/23 0456 07/08/23 0401  Weight: 110.4 kg 109.5 kg 109.1 kg    History of present illness:   78 year old female history of type 2 diabetes, hypertension, sleep apnea presented to the ED with increasing dyspnea on exertion which she noticed after increased dose of Ozempic . Patient with dyspnea on minimal exertion. Patient seen in the ED noted to be bradycardic with Jasmin [name] 2-1 AV block with prolonged PR up to 450 MS. It was noted that while ambulating in the ER patient's heart rate did go up into the 70s. Patient admitted for further evaluation. Cardiology consulted.   She had extensive cardiac workup, ultimately undergoing pacemaker placement due to intermittent second degree AV block.    Improved at the time of discharge.  See below and prior notes for additional details   Hospital Course:  Assessment and Plan:  2:1 AV block Symptomatic Bradycardia Appreciate cards assistance S/p L/RHC with angiographically normal coronary  arteries - consider nonischemic etiology for patient's symptoms, eval bradycardia Now s/p pacemaker implantation   NSVT metop   Aortic Valve Stenosis Cards follow up   Dyslipidemia Simvastatin    Hypertension losartan  Hx CVA Aspirin , plavix   OSA Nightly CPAP   T2DM SSI On metformin  prior to admission Unlikely ozempic  related based on her symptoms - follow with PCP prior to resuming meds   E. Coli UTI Complete course of duricef, 1 more dose tonight  Obesity Body mass index is 45.45 kg/m.     Procedures: 5/16 CONCLUSIONS:   1. Successful implantation of Briel Gallicchio Abbott Assurity [id number] dual-chamber pacemaker for symptomatic bradycardia  2. No early apparent complications.   L/RHC POST-OPERATIVE DIAGNOSIS:   Angiographically normal coronary arteries with Jasmin [name] codominant system Very tortuous innominate artery making it very difficult to engage the RCA-nonselective images obtained. Relatively Normal Right Heart Cath Numbers: RAP mean 8 mmHg; RV P-EDP 37/0-13 mmHg; PAP-mean 38/21-22 mmHg with Aryel Edelen PCWP of 24 mmHg. AO P-MAP 130/39-65 mmHg; LV P-EDP 164/13-19 mmHg. Ao sat 93%, PA sat 63%.  Cardiac output-index That (Fick) 5.57-2.74. ~Moderate aortic stenosis with mean AVG estimated 21 mmHg   PLAN OF CARE: Return to nursing unit for ongoing care and continued management.  .  Anticipated discharge date to be determined.   Consider nonischemic etiology for the patient's symptoms. Likely need to evaluate bradycardia.  Echo IMPRESSIONS     1. Left ventricular ejection fraction, by estimation, is 60 to 65%. The  left ventricle has normal function. The left ventricle has no regional  wall motion abnormalities. There is moderate asymmetric left ventricular  hypertrophy of the basal-septal  segment. Left ventricular diastolic parameters are consistent with Grade  II diastolic dysfunction (pseudonormalization).   2. Right ventricular systolic function is normal. The right ventricular   size is normal. Tricuspid regurgitation signal is inadequate for assessing  PA pressure.   3. Left atrial size was mildly dilated.   4. The mitral valve is degenerative. Trivial mitral valve regurgitation.  No evidence of mitral stenosis. Moderate mitral annular calcification.   5. The aortic valve is tricuspid. There is moderate calcification of the  aortic valve. Aortic valve regurgitation is mild. Moderate aortic valve  stenosis. Aortic valve mean gradient measures 23.0 mmHg, AVA 1.38 cm^2.   6. Aortic dilatation noted. There is mild dilatation of the ascending  aorta, measuring 40 mm.   7. The inferior vena cava is dilated in size with >50% respiratory  variability, suggesting right atrial pressure of 8 mmHg.   Consultations: cardiology  Discharge Exam: Vitals:   07/08/23 0809 07/08/23 0918  BP: (!) 156/52 (!) 156/52  Pulse: 68 68  Resp: 17   Temp: 98.2 F (36.8 C)   SpO2: 100%    No complaints Eager to go home Husband at bedside Discussed d/c plan  General: No acute distress. Cardiovascular: Heart sounds show Jasmin [name] regular rate, and rhythm. Steristrips over pacemaker pocket Lungs: unlabored Neurological: Alert and oriented 3. Moves all extremities 4 with equal strength. Cranial nerves II through XII grossly intact. Extremities: No clubbing or cyanosis. No edema.   Discharge Instructions   Discharge Instructions     Call MD for:  difficulty breathing, headache or visual disturbances   Complete by: As directed    Call MD for:  extreme fatigue   Complete by: As directed    Call MD for:  hives   Complete by: As directed    Call MD for:  persistant dizziness or light-headedness   Complete by: As directed    Call MD for:  persistant nausea and vomiting   Complete by: As directed    Call MD for:  redness, tenderness, or signs of infection (pain, swelling, redness, odor or green/yellow discharge around incision site)   Complete by: As directed    Call MD for:   severe uncontrolled pain   Complete by: As directed    Call MD for:  temperature >100.4   Complete by: As directed    Diet - low sodium heart healthy   Complete by: As directed    Discharge instructions   Complete by: As directed    You were seen for symptomatic bradycardia (symptoms from [place] slow heart rate).  You had an extensive cardiac workup.  You had Amonie Wisser pacemaker placed on 5/16.  You had an echo (heart ultrasound) that showed normal pump and diastolic dysfunction (stiffness).  You have some moderate aortic valve stenosis which needs follow up with your cardiologists outpatient.    We've started you on metoprolol  and lasix .    You have one more dose of antibiotics to take tonight for your UTI.  I don't think this was related to ozempic , but discuss with your PCP prior to restarting this at home.  Return for new, recurrent, or worsening symptoms.  Please ask your PCP to request records from this hospitalization so they know what was done and what the next steps will be.   Increase activity slowly   Complete by: As directed       Allergies as of 07/08/2023       Reactions   Latex Itching  Ciprofloxacin Hives   Metronidazole Hives   Tape Rash, Itching   Redness of skin        Medication List     TAKE these medications    Accu-Chek Guide Test test strip Generic drug: glucose blood Use as directed to check blood glucose daily.   Accu-Chek Softclix Lancets lancets USE AS DIRECTED TO CHECK BLOOD GLUCOSE ONCE DAILY   amoxicillin  500 MG tablet Commonly known as: AMOXIL  Take 500 mg by mouth 2 (two) times daily. 1 hr prior to dentist visit   aspirin  EC 81 MG tablet Take 1 tablet (81 mg total) by mouth daily. Swallow whole.   blood glucose meter kit and supplies Kit Dispense based on patient and insurance preference. Use once daily to monitor glucose as needed (FOR ICD-9 250.00, 250.01).   CALCIUM  PO Take 1 tablet by mouth daily.   cefadroxil  500 MG  capsule Commonly known as: DURICEF Take 1 capsule (500 mg total) by mouth 2 (two) times daily for 1 dose. Take your last dose tonight   clopidogrel  75 MG tablet Commonly known as: PLAVIX  Take 1 tablet (75 mg total) by mouth daily.   clotrimazole -betamethasone  cream Commonly known as: LOTRISONE  Apply to plantar foot daily   CRANBERRY EXTRACT PO Take 25,000 mg by mouth 2 (two) times daily.   desmopressin  0.2 MG tablet Commonly known as: DDAVP  Take 1 tablet (0.2 mg total) by mouth at bedtime.   desonide  0.05 % cream Commonly known as: DESOWEN  Apply as directed to affected area.   econazole nitrate 1 % cream Apply 1 Application topically as needed (skin irritation).   estradiol  0.1 MG/GM vaginal cream Commonly known as: ESTRACE  Apply 0.5 grams vaginally nightly for 2 weeks, then apply 0.5 grams 2 times weekly   fluticasone  50 MCG/ACT nasal spray Commonly known as: FLONASE  Place 2 sprays into both nostrils daily.   furosemide  20 MG tablet Commonly known as: LASIX  Take 1 tablet (20 mg total) by mouth daily. Follow with cardiology outpatient for management of your lasix  Start taking on: Jul 09, 2023   losartan  50 MG tablet Commonly known as: COZAAR  Take 1 & 1/2 tablets (75 mg total) by mouth daily.   metFORMIN  500 MG tablet Commonly known as: GLUCOPHAGE  Take 1 tablet (500 mg total) by mouth daily with breakfast.   metoprolol  succinate 25 MG 24 hr tablet Commonly known as: TOPROL -XL Take 1 tablet (25 mg total) by mouth daily. Start taking on: Jul 09, 2023   Myrbetriq  50 MG Tb24 tablet Generic drug: mirabegron  ER Take 1 tablet (50 mg total) by mouth daily.   omega-3 acid ethyl esters 1 g capsule Commonly known as: LOVAZA  Take 2 capsules (2 g total) by mouth 2 (two) times daily.   OXYCODONE  HCL PO Take 1 tablet by mouth as needed (pain).   pantoprazole  40 MG tablet Commonly known as: PROTONIX  Take 1 tablet (40 mg total) by mouth 2 (two) times daily.   PSYLLIUM  PO Take 1 tablet by mouth daily.   Restasis  MultiDose 0.05 % ophthalmic emulsion Generic drug: cycloSPORINE  Place 1 drop into both eyes 2 (two) times daily.   cycloSPORINE  0.05 % ophthalmic emulsion Commonly known as: RESTASIS  Place 1 drop into both eyes 2 (two) times daily.   simvastatin  20 MG tablet Commonly known as: ZOCOR  Take 1 tablet (20 mg total) by mouth at bedtime.   solifenacin  10 MG tablet Commonly known as: VESICARE  Take 1 tablet (10 mg total) by mouth daily. Swallow whole do not  crush, chew, or split.   SYSTANE OP Apply 1 drop to eye daily as needed (for dry eyes).   TYLENOL  ARTHRITIS PAIN PO Take 650 mg by mouth every 8 (eight) hours as needed (PAIN).   vitamin C  with rose hips 500 MG tablet Take 500 mg by mouth daily.   Vitamin D3 50 MCG (2000 UT) Tabs Take 1 tablet by mouth in the morning and at bedtime.       Allergies  Allergen Reactions   Latex Itching   Ciprofloxacin Hives   Metronidazole Hives   Tape Rash and Itching    Redness of skin      The results of significant diagnostics from this hospitalization (including imaging, microbiology, ancillary and laboratory) are listed below for reference.    Significant Diagnostic Studies: DG Chest 2 View Result Date: 07/08/2023 CLINICAL DATA:  Shortness of breath.  Cardiac device in situ. EXAM: CHEST - 2 VIEW COMPARISON:  Jul 02, 2023. FINDINGS: The heart size and mediastinal contours are within normal limits. Left-sided pacemaker is noted with leads in grossly good position. No pneumothorax. Both lungs are clear. The visualized skeletal structures are unremarkable. IMPRESSION: Left-sided pacemaker is noted in grossly good position. Electronically Signed   By: Rosalene Colon M.D.   On: 07/08/2023 10:23   EP PPM/ICD IMPLANT Result Date: 07/07/2023 SURGEON:  Agatha Horsfall, MD   PREPROCEDURE DIAGNOSIS:  intermittent 2:1 AV block   POSTPROCEDURE DIAGNOSIS:  intermittent 2:1 AV block    PROCEDURES:  1.  Pacemaker implantation.   INTRODUCTION:  Braylyn Eye is Kataleyah Carducci 78 y.o. female with Gilma Bessette history of bradycardia who presents today for pacemaker implantation.  The patient reports intermittent episodes of dizziness over the past few months.  No reversible causes have been identified.  The patient therefore presents today for pacemaker implantation.   DESCRIPTION OF PROCEDURE:  Informed written consent was obtained, and  the patient was brought to the electrophysiology lab in Zoria Rawlinson fasting state.  The patient required no sedation for the procedure today.  The patients left chest was prepped and draped in the usual sterile fashion by the EP lab staff. The skin overlying the left deltopectoral region was infiltrated with lidocaine  for local analgesia.  Sejal Cofield 4-cm incision was made over the left deltopectoral region.  Tomy Khim left subcutaneous pacemaker pocket was fashioned using Jacquelynne Guedes combination of sharp and blunt dissection. Electrocautery was required to assure hemostasis.  RA/RV Lead Placement: The left axillary vein was therefore cannulated.  Through the left axillary vein, Meghann Landing Abbott Ultipace 1231-52  (serial number  [id number]) right atrial lead and an Abbott Ultipace 1231-65 (serial number  [id number]) right ventricular lead were advanced with fluoroscopic visualization into the right atrial appendage and left bundle area positions respectively.  Initial atrial lead P- waves measured 3.9 mV with impedance of 450 ohms and Bulmaro Feagans threshold of 0.75 V at 0.5 msec.  Right ventricular lead R-waves measured 7.5 mV with an impedance of 710 ohms and Anjela Cassara threshold of 0.75 V at 0.5 msec.  Both leads were secured to the pectoralis fascia using #2-0 silk over the suture sleeves. Device Placement:  The leads were then connected to an Abbott Assurity [id number]  (serial number  [id number] ) pacemaker.  The pocket was irrigated with copious gentamicin  solution.  The pacemaker was then placed into the pocket.  The pocket was then closed in 3 layers with 2.0 and 3.0  V-Loc suture for the subcutaneous layers and 3.0 Vicryl suture for the subcuticular layers.  Steri-  Strips and [place] Munguia sterile dressing were then applied. EBL<74ml.  There were no early apparent complications.   CONCLUSIONS:  1. Successful implantation of Melyssa Signor Abbott Assurity [id number] dual-chamber pacemaker for symptomatic bradycardia  2. No early apparent complications.   Will Lawana Pray, MD 07/07/2023 3:34 PM  CARDIAC CATHETERIZATION Result Date: 07/04/2023 Images from the original result were not included. Dominance: Right POST-OPERATIVE DIAGNOSIS:  Angiographically normal coronary arteries with Jenafer Winterton codominant system Very tortuous innominate artery making it very difficult to engage the RCA-nonselective images obtained. Relatively Normal Right Heart Cath Numbers: RAP mean 8 mmHg; RV P-EDP 37/0-13 mmHg; PAP-mean 38/21-22 mmHg with Yoselyn Mcglade PCWP of 24 mmHg. AO P-MAP 130/39-65 mmHg; LV P-EDP 164/13-19 mmHg. Ao sat 93%, PA sat 63%.  Cardiac output-index That (Fick) 5.57-2.74. ~Moderate aortic stenosis with mean AVG estimated 21 mmHg PLAN OF CARE: Return to nursing unit for ongoing care and continued management. .  Anticipated discharge date to be determined.   Consider nonischemic etiology for the patient's symptoms. Likely need to evaluate bradycardia. Randene Bustard, MD  ECHOCARDIOGRAM COMPLETE Result Date: 07/03/2023    ECHOCARDIOGRAM REPORT   Patient Name:   JAMISON YUHASZ Date of Exam: 07/03/2023 Medical Rec #:  [id number]       Height:       61.0 in Accession #:    [id number]      Weight:       244.4 lb Date of Birth:  1945-11-01       BSA:          2.057 m Patient Age:    78 years        BP:           118/75 mmHg Patient Gender: F               HR:           59 bpm. Exam Location:  Inpatient Procedure: 2D Echo, Cardiac Doppler and Color Doppler (Both Spectral and Color            Flow Doppler were utilized during procedure). Indications:    Fatigue, heart block, shortness of breath  History:        Patient has prior history of  Echocardiogram examinations.  Sonographer:    Janette Medley Referring Phys: [id number] DANIEL V THOMPSON IMPRESSIONS  1. Left ventricular ejection fraction, by estimation, is 60 to 65%. The left ventricle has normal function. The left ventricle has no regional wall motion abnormalities. There is moderate asymmetric left ventricular hypertrophy of the basal-septal segment. Left ventricular diastolic parameters are consistent with Grade II diastolic dysfunction (pseudonormalization).  2. Right ventricular systolic function is normal. The right ventricular size is normal. Tricuspid regurgitation signal is inadequate for assessing PA pressure.  3. Left atrial size was mildly dilated.  4. The mitral valve is degenerative. Trivial mitral valve regurgitation. No evidence of mitral stenosis. Moderate mitral annular calcification.  5. The aortic valve is tricuspid. There is moderate calcification of the aortic valve. Aortic valve regurgitation is mild. Moderate aortic valve stenosis. Aortic valve mean gradient measures 23.0 mmHg, AVA 1.38 cm^2.  6. Aortic dilatation noted. There is mild dilatation of the ascending aorta, measuring 40 mm.  7. The inferior vena cava is dilated in size with >50% respiratory variability, suggesting right atrial pressure of 8 mmHg. FINDINGS  Left Ventricle: Left ventricular ejection fraction, by estimation, is 60 to 65%. The left ventricle has normal function. The left ventricle has no regional wall motion abnormalities. The left  ventricular internal cavity size was normal in size. There is  moderate asymmetric left ventricular hypertrophy of the basal-septal segment. Left ventricular diastolic parameters are consistent with Grade II diastolic dysfunction (pseudonormalization). Right Ventricle: The right ventricular size is normal. No increase in right ventricular wall thickness. Right ventricular systolic function is normal. Tricuspid regurgitation signal is inadequate for assessing PA pressure. Left  Atrium: Left atrial size was mildly dilated. Right Atrium: Right atrial size was normal in size. Pericardium: There is no evidence of pericardial effusion. Mitral Valve: The mitral valve is degenerative in appearance. There is mild calcification of the mitral valve leaflet(s). Moderate mitral annular calcification. Trivial mitral valve regurgitation. No evidence of mitral valve stenosis. Tricuspid Valve: The tricuspid valve is normal in structure. Tricuspid valve regurgitation is not demonstrated. Aortic Valve: The aortic valve is tricuspid. There is moderate calcification of the aortic valve. Aortic valve regurgitation is mild. Moderate aortic stenosis is present. Aortic valve mean gradient measures 23.0 mmHg. Aortic valve peak gradient measures 22.7 mmHg. Aortic valve area, by VTI measures 1.99 cm. Pulmonic Valve: The pulmonic valve was normal in structure. Pulmonic valve regurgitation is not visualized. Aorta: The aortic root is normal in size and structure and aortic dilatation noted. There is mild dilatation of the ascending aorta, measuring 40 mm. Venous: The inferior vena cava is dilated in size with greater than 50% respiratory variability, suggesting right atrial pressure of 8 mmHg. IAS/Shunts: No atrial level shunt detected by color flow Doppler.  LEFT VENTRICLE PLAX 2D LVIDd:         4.60 cm   Diastology LVIDs:         3.20 cm   LV e' lateral: 12.80 cm/s LV PW:         1.30 cm LV IVS:        1.50 cm LVOT diam:     2.00 cm LV SV:         108 LV SV Index:   53 LVOT Area:     3.14 cm  RIGHT VENTRICLE             IVC RV S prime:     17.80 cm/s  IVC diam: 2.40 cm TAPSE (M-mode): 3.1 cm LEFT ATRIUM              Index        RIGHT ATRIUM           Index LA diam:        3.10 cm  1.51 cm/m   RA Area:     17.30 cm LA Vol (A2C):   114.0 ml 55.43 ml/m  RA Volume:   46.00 ml  22.37 ml/m LA Vol (A4C):   37.3 ml  18.14 ml/m LA Biplane Vol: 70.0 ml  34.03 ml/m  AORTIC VALVE AV Area (Vmax):    1.73 cm AV Area  (Vmean):   1.61 cm AV Area (VTI):     1.99 cm AV Vmax:           238.00 cm/s AV Vmean:          164.500 cm/s AV VTI:            0.542 m AV Peak Grad:      22.7 mmHg AV Mean Grad:      23.0 mmHg LVOT Vmax:         131.00 cm/s LVOT Vmean:        84.100 cm/s LVOT VTI:  0.344 m LVOT/AV VTI ratio: 0.64  AORTA Ao Root diam: 2.80 cm Ao Asc diam:  4.00 cm  SHUNTS Systemic VTI:  0.34 m Systemic Diam: 2.00 cm Dalton McleanMD Electronically signed by Archer Bear Signature Date/Time: 07/03/2023/6:38:42 PM    Final    DG Chest 2 View Result Date: 07/02/2023 CLINICAL DATA:  Chest pain, SOB. EXAM: CHEST - 2 VIEW COMPARISON:  01/17/2022 FINDINGS: The heart size and mediastinal contours are within normal limits. No pneumothorax or pleural effusion. Lower lung interstitial prominence consistent with scarring or subsegmental atelectasis. Calcified aorta. Thoracic degenerative changes. IMPRESSION: Bibasilar subsegmental atelectasis or scarring. Electronically Signed   By: Sydell Eva M.D.   On: 07/02/2023 20:26   MM 3D SCREENING MAMMOGRAM BILATERAL BREAST Result Date: 06/15/2023 CLINICAL DATA:  Screening. EXAM: DIGITAL SCREENING BILATERAL MAMMOGRAM WITH TOMOSYNTHESIS AND CAD TECHNIQUE: Bilateral screening digital craniocaudal and mediolateral oblique mammograms were obtained. Bilateral screening digital breast tomosynthesis was performed. The images were evaluated with computer-aided detection. COMPARISON:  Previous exam(s). ACR Breast Density Category Giabella Duhart: The breasts are almost entirely fatty. FINDINGS: There are no findings suspicious for malignancy. IMPRESSION: No mammographic evidence of malignancy. Akshaj Besancon result letter of this screening mammogram will be mailed directly to the patient. RECOMMENDATION: Screening mammogram in one year. (Code:SM-B-01Y) BI-RADS CATEGORY  1: Negative. Electronically Signed   By: Alger Infield M.D.   On: 06/15/2023 07:34    Microbiology: Recent Results (from the past 240 hours)   Resp panel by RT-PCR (RSV, Flu Shelly Shoultz&B, Covid) Anterior Nasal Swab     Status: None   Collection Time: 07/03/23 12:27 AM   Specimen: Anterior Nasal Swab  Result Value Ref Range Status   SARS Coronavirus 2 by RT PCR NEGATIVE NEGATIVE Final    Comment: (NOTE) SARS-CoV-2 target nucleic acids are NOT DETECTED.  The SARS-CoV-2 RNA is generally detectable in upper respiratory specimens during the acute phase of infection. The lowest concentration of SARS-CoV-2 viral copies this assay can detect is 138 copies/mL. Jumar Greenstreet negative result does not preclude SARS-Cov-2 infection and should not be used as the sole basis for treatment or other patient management decisions. Shamara Soza negative result may occur with  improper specimen collection/handling, submission of specimen other than nasopharyngeal swab, presence of viral mutation(s) within the areas targeted by this assay, and inadequate number of viral copies(<138 copies/mL). Syrenity Klepacki negative result must be combined with clinical observations, patient history, and epidemiological information. The expected result is Negative.  Fact Sheet for Patients:  BloggerCourse.com  Fact Sheet for Healthcare Providers:  SeriousBroker.it  This test is no t yet approved or cleared by the [place]  FDA and  has been authorized for detection and/or diagnosis of SARS-CoV-2 by FDA under an Emergency Use Authorization (EUA). This EUA will remain  in effect (meaning this test can be used) for the duration of the COVID-19 declaration under Section 564(b)(1) of the Act, 21 U.S.C.section 360bbb-3(b)(1), unless the authorization is terminated  or revoked sooner.       Influenza Mckynlee Luse by PCR NEGATIVE NEGATIVE Final   Influenza B by PCR NEGATIVE NEGATIVE Final    Comment: (NOTE) The Xpert Xpress SARS-CoV-2/FLU/RSV plus assay is intended as an aid in the diagnosis of influenza from Nasopharyngeal swab specimens and should not be used  as Toniya Rozar sole basis for treatment. Nasal washings and aspirates are unacceptable for Xpert Xpress SARS-CoV-2/FLU/RSV testing.  Fact Sheet for Patients: BloggerCourse.com  Fact Sheet for Healthcare Providers: SeriousBroker.it  This test is not yet approved or cleared by the  [place]  FDA and has been authorized for detection and/or diagnosis of SARS-CoV-2 by FDA under an Emergency Use Authorization (EUA). This EUA will remain in effect (meaning this test can be used) for the duration of the COVID-19 declaration under Section 564(b)(1) of the Act, 21 U.S.C. section 360bbb-3(b)(1), unless the authorization is terminated or revoked.     Resp Syncytial Virus by PCR NEGATIVE NEGATIVE Final    Comment: (NOTE) Fact Sheet for Patients: BloggerCourse.com  Fact Sheet for Healthcare Providers: SeriousBroker.it  This test is not yet approved or cleared by the [place]  FDA and has been authorized for detection and/or diagnosis of SARS-CoV-2 by FDA under an Emergency Use Authorization (EUA). This EUA will remain in effect (meaning this test can be used) for the duration of the COVID-19 declaration under Section 564(b)(1) of the Act, 21 U.S.C. section 360bbb-3(b)(1), unless the authorization is terminated or revoked.  Performed at [facility], [address] [place], [place] [address]   Urine Culture     Status: Abnormal   Collection Time: 07/03/23  1:35 AM   Specimen: Urine, Clean Catch  Result Value Ref Range Status   Specimen Description   Final    URINE, CLEAN CATCH Performed at [facility], [address] [place], [place] [address]    Special Requests   Final    NONE Performed at [facility], [address] [place], [place] [address]    Culture >=100,000 COLONIES/mL ESCHERICHIA COLI (Kryslyn Helbig)  Final   Report Status 07/05/2023 FINAL  Final    Organism ID, Bacteria ESCHERICHIA COLI (Ashleynicole Mcclees)  Final      Susceptibility   Escherichia coli - MIC*    AMPICILLIN >=32 RESISTANT Resistant     CEFAZOLIN  <=4 SENSITIVE Sensitive     CEFEPIME <=0.12 SENSITIVE Sensitive     CEFTRIAXONE  <=0.25 SENSITIVE Sensitive     CIPROFLOXACIN <=0.25 SENSITIVE Sensitive     GENTAMICIN  <=1 SENSITIVE Sensitive     IMIPENEM 1 SENSITIVE Sensitive     NITROFURANTOIN  <=16 SENSITIVE Sensitive     TRIMETH /SULFA  <=20 SENSITIVE Sensitive     AMPICILLIN/SULBACTAM 16 INTERMEDIATE Intermediate     PIP/TAZO <=4 SENSITIVE Sensitive ug/mL    * >=100,000 COLONIES/mL ESCHERICHIA COLI  Surgical PCR screen     Status: None   Collection Time: 07/05/23  3:14 PM   Specimen: Nasal Mucosa; Nasal Swab  Result Value Ref Range Status   MRSA, PCR NEGATIVE NEGATIVE Final   Staphylococcus aureus NEGATIVE NEGATIVE Final    Comment: (NOTE) The Xpert SA Assay (FDA approved for NASAL specimens in patients 35 years of age and older), is one component of [facility] comprehensive surveillance program. It is not intended to diagnose infection nor to guide or monitor treatment. Performed at [facility], [address] [place], [place] [address]      Labs: Basic Metabolic Panel: Recent Labs  Lab 07/03/23 0157 07/03/23 0748 07/04/23 0427 07/04/23 1434 07/04/23 1526 07/04/23 1531 07/04/23 2028 07/05/23 0437 07/06/23 0515 07/07/23 0800  NA  --  139 139   < > 141  141 140  --  139 139 140  K  --  3.9 4.2   < > 3.8  3.8 3.8  --  3.8 4.2 4.4  CL  --  105 103  --   --   --   --  107 108 105  CO2  --  21* 25  --   --   --   --  23 21* 24  GLUCOSE  --  136* 134*  --   --   --   --  138* 125* 118*  BUN  --  13 22  --   --   --   --  19 19 14   CREATININE  --  0.62 0.81  --   --   --  0.77 0.76 0.72 0.66  CALCIUM   --  9.0 9.2  --   --   --   --  8.3* 8.9 9.1  MG 2.0  --  2.0  --   --   --   --  2.0 2.1 2.2  PHOS  --   --   --   --   --   --   --   --  4.0 3.7   < > = values in this  interval not displayed.   Liver Function Tests: Recent Labs  Lab 07/02/23 1812 07/06/23 0515 07/07/23 0800  AST 24 24 25   ALT 21 13 13   ALKPHOS 63 40 42  BILITOT 0.5 1.0 1.3*  PROT 6.7 5.6* 6.3*  ALBUMIN 4.1 3.2* 3.6   Recent Labs  Lab 07/02/23 1812  LIPASE 33   No results for input(s): "AMMONIA" in the last 168 hours. CBC: Recent Labs  Lab 07/02/23 1812 07/03/23 0748 07/04/23 0427 07/04/23 1434 07/04/23 1531 07/04/23 2028 07/05/23 0437 07/06/23 0515 07/07/23 0800  WBC 11.3* 11.0* 9.4  --   --  9.1 7.6 7.6 6.8  NEUTROABS 7.6 9.0* 6.6  --   --   --   --  4.8 4.5  HGB 12.3 12.4 11.9*   < > 12.2 12.9 10.9* 11.6* 12.1  HCT 37.4 39.1 36.1   < > 36.0 39.6 34.0* 35.9* 36.9  MCV 87.2 91.4 87.0  --   --  87.8 88.1 88.0 86.8  PLT 305 276 273  --   --  304 280 287 327   < > = values in this interval not displayed.   Cardiac Enzymes: No results for input(s): "CKTOTAL", "CKMB", "CKMBINDEX", "TROPONINI" in the last 168 hours. BNP: BNP (last 3 results) No results for input(s): "BNP" in the last 8760 hours.  ProBNP (last 3 results) Recent Labs    07/03/23 0045  PROBNP 2,556.0*    CBG: Recent Labs  Lab 07/07/23 0739 07/07/23 1210 07/07/23 1643 07/07/23 2052 07/08/23 0804  GLUCAP 110* 109* 81 148* 127*       Signed:  Donnetta Gains MD.  [facility] 07/08/2023, 10:50 AM

## 2023-07-08 NOTE — Progress Notes (Signed)
   Patient Name: Jasmin [name] Date of Encounter: 07/08/2023 [facility] Cardiologist: Ralene Burger, MD   Interval Summary  .    Feeling well without complaint  Vital Signs .    Vitals:   07/07/23 2148 07/07/23 2335 07/08/23 0401 07/08/23 0809  BP: (!) 154/63 (!) 147/64 (!) 152/71 (!) 156/52  Pulse:  75 74 68  Resp:  20 17 17   Temp:  98.8 F (37.1 C) 98.6 F (37 C) 98.2 F (36.8 C)  TempSrc:  Oral Oral Oral  SpO2:  100% 100% 100%  Weight:   109.1 kg   Height:        Intake/Output Summary (Last 24 hours) at 07/08/2023 0838 Last data filed at 07/08/2023 0000 Gross per 24 hour  Intake 360 ml  Output --  Net 360 ml      07/08/2023    4:01 AM 07/07/2023    4:56 AM 07/06/2023    5:04 AM  Last 3 Weights  Weight (lbs) 240 lb 8.4 oz 241 lb 4.8 oz 243 lb 4.8 oz  Weight (kg) 109.1 kg 109.453 kg 110.36 kg      Telemetry/ECG    Sinus rhythm- Personally Reviewed  Physical Exam .   GEN: No acute distress.   Neck: No JVD Cardiac: RRR, no murmurs, rubs, or gallops.  Respiratory: Clear to auscultation bilaterally. GI: Soft, nontender, non-distended  MS: No edema  Assessment & Plan .     1.  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  Interrogation without issues.  Awaiting chest x-ray.  If no pneumothorax, would be okay for discharge.  Jeneva Schweizer arrange for follow-up in EP clinic.  2.  E. coli UTI: Plan per primary team  For questions or updates, please contact [facility] Please consult [email or web address] for contact info under        Signed, Koji Niehoff Cortland Ding, MD

## 2023-07-10 ENCOUNTER — Telehealth: Payer: Self-pay

## 2023-07-10 ENCOUNTER — Other Ambulatory Visit (HOSPITAL_BASED_OUTPATIENT_CLINIC_OR_DEPARTMENT_OTHER): Payer: Self-pay

## 2023-07-10 ENCOUNTER — Encounter (HOSPITAL_COMMUNITY): Payer: Self-pay | Admitting: Cardiology

## 2023-07-10 ENCOUNTER — Telehealth: Payer: Self-pay | Admitting: Cardiology

## 2023-07-10 ENCOUNTER — Other Ambulatory Visit: Payer: Self-pay

## 2023-07-10 NOTE — Transitions of Care (Post Inpatient/ED Visit) (Signed)
   07/10/2023  Name: Jasmin [name] MRN: [id number] DOB: [birth year]  Today's TOC FU Call Status: Today's TOC FU Call Status:: Unsuccessful Call (1st Attempt) Unsuccessful Call (1st Attempt) Date: 07/10/23  Attempted to reach the patient regarding the most recent Inpatient/ED visit.  Follow Up Plan: Additional outreach attempts will be made to reach the patient to complete the Transitions of Care (Post Inpatient/ED visit) call.   Tonia Frankel RN, CCM [place]  VBCI-Population Health RN Care Manager [phone]

## 2023-07-10 NOTE — Telephone Encounter (Signed)
 Pt c/o medication issue:  1. Name of Medication: metoprolol  succinate (TOPROL -XL) 25 MG 24 hr tablet  furosemide  (LASIX ) 20 MG tablet   2. How are you currently taking this medication (dosage and times per day)? As written   3. Are you having a reaction (difficulty breathing--STAT)? No   4. What is your medication issue? Pt states these medications are making her itch all over. Please advise

## 2023-07-11 ENCOUNTER — Other Ambulatory Visit: Payer: Self-pay | Admitting: Family Medicine

## 2023-07-11 ENCOUNTER — Other Ambulatory Visit (HOSPITAL_BASED_OUTPATIENT_CLINIC_OR_DEPARTMENT_OTHER): Payer: Self-pay

## 2023-07-11 ENCOUNTER — Telehealth: Payer: Self-pay

## 2023-07-11 ENCOUNTER — Ambulatory Visit: Attending: Cardiovascular Disease

## 2023-07-11 DIAGNOSIS — Z8673 Personal history of transient ischemic attack (TIA), and cerebral infarction without residual deficits: Secondary | ICD-10-CM

## 2023-07-11 DIAGNOSIS — Z950 Presence of cardiac pacemaker: Secondary | ICD-10-CM | POA: Insufficient documentation

## 2023-07-11 MED ORDER — CLOPIDOGREL BISULFATE 75 MG PO TABS
75.0000 mg | ORAL_TABLET | Freq: Every day | ORAL | 0 refills | Status: DC
  Filled 2023-07-11 – 2023-08-13 (×2): qty 90, 90d supply, fill #0

## 2023-07-11 NOTE — Telephone Encounter (Signed)
 Spoke with pt who states that she saw the doctor this am and her problem has been addressed.

## 2023-07-11 NOTE — Progress Notes (Signed)
 Patient seen in clinic as walk in. Has allergy to adhesive. There was clear areas from adhesive where there was a reaction. Additionally patient was having a nearly global dermatological reaction to something. Started Metop and Lasix  on Sunday and did not take dose today. Camnitz in room to evaluate. Metop and Lasix  stopped for now to assess if rash improved. Advised patient to take benadryl PO and topical if necessary or Hyrdrocortisone 1% anywhere but around incision. If problem persists to follow up with PCP.

## 2023-07-11 NOTE — Transitions of Care (Post Inpatient/ED Visit) (Signed)
   07/11/2023  Name: Jasmin [name] MRN: [id number] DOB: [birth year]  Today's TOC FU Call Status: Today's TOC FU Call Status:: Unsuccessful Call (2nd Attempt) Unsuccessful Call (2nd Attempt) Date: 07/11/23  Attempted to reach the patient regarding the most recent Inpatient/ED visit. Unsuccessful outreach attempt, but per chart review or outreach it was noted had just left follow up appointment with provider checking device post discharge. Also noted that PCP HFU appointment has been changed to 07/12/23 from a scheduled 08/09/23 appointment noted on chart review for this outreach attempt.   Follow Up Plan: Additional outreach attempts will be made to reach the patient to complete the Transitions of Care (Post Inpatient/ED visit) call.   Katheryn Pandy MSN, RN RN Case Sales executive Health  VBCI-Population Health Office Hours M-F [phone] Direct Dial: [phone] Main Phone [phone]  Fax: [phone] [email or web address]

## 2023-07-12 ENCOUNTER — Other Ambulatory Visit (HOSPITAL_BASED_OUTPATIENT_CLINIC_OR_DEPARTMENT_OTHER): Payer: Self-pay

## 2023-07-12 ENCOUNTER — Encounter: Payer: Self-pay | Admitting: Family Medicine

## 2023-07-12 ENCOUNTER — Telehealth: Payer: Self-pay

## 2023-07-12 ENCOUNTER — Ambulatory Visit: Admitting: Family Medicine

## 2023-07-12 VITALS — BP 118/62 | HR 64 | Ht 61.0 in | Wt 238.8 lb

## 2023-07-12 DIAGNOSIS — Z7984 Long term (current) use of oral hypoglycemic drugs: Secondary | ICD-10-CM | POA: Diagnosis not present

## 2023-07-12 DIAGNOSIS — I441 Atrioventricular block, second degree: Secondary | ICD-10-CM | POA: Diagnosis not present

## 2023-07-12 DIAGNOSIS — E119 Type 2 diabetes mellitus without complications: Secondary | ICD-10-CM | POA: Diagnosis not present

## 2023-07-12 DIAGNOSIS — R21 Rash and other nonspecific skin eruption: Secondary | ICD-10-CM | POA: Diagnosis not present

## 2023-07-12 MED ORDER — METFORMIN HCL 500 MG PO TABS
500.0000 mg | ORAL_TABLET | Freq: Two times a day (BID) | ORAL | 3 refills | Status: DC
  Filled 2023-07-12 – 2023-09-06 (×3): qty 180, 90d supply, fill #0

## 2023-07-12 NOTE — Transitions of Care (Post Inpatient/ED Visit) (Addendum)
   07/12/2023  Name: Jasmin [name] MRN: [id number] DOB: [birth year]  Today's TOC FU Call Status: Today's TOC FU Call Status:: Unsuccessful Call (3rd Attempt) Unsuccessful Call (3rd Attempt) Date: 07/12/23  Attempted to reach the patient regarding the most recent Inpatient/ED visit. NOTE: Upon chart review for this post discharge outreach attempt #3 it was noted that PCP OV completed 07/12/23. 07/11/23: Completed follow up for pacemaker sound check. Spouse answered, patient unavailable.   Follow Up Plan: No further outreach attempts will be made at this time. We have been unable to contact the patient.   Katheryn Pandy MSN, RN RN Case Sales executive Health  [facility]BC[facility]-Population Health Office Hours M-F [phone] Direct Dial: [phone] Main Phone [phone]  Fax: [phone] [email or web address]

## 2023-07-12 NOTE — Patient Instructions (Addendum)
 I am glad you are home and feeling better!  We can go back up to metformin  twice daily Continue benadryl as needed for your rash- you can taper down as you improve Add a non- sedating antihistamine such as zyrtec once daily, and a daily H2 blocker like Tagament may also help. Use these for about 2 weeks to help clear up your rash Let me know if you don't continue to improve

## 2023-07-12 NOTE — Progress Notes (Signed)
 [facility] [address] [place], [place] [address] [phone]  Date:  07/12/2023   Name:  Jasmin [name]   DOB:  [birth year]   MRN:  [id number]  PCP:  [name], MD    Chief Complaint: Follow-up (Pt has a rash all over stomach /Pt believes it could be the metoprolol  or the lasix  )   History of Present Illness:  Jasmin [name] is a 78 y.o. very pleasant female patient who presents with the following:  Patient seen today for follow-up from recent pacemaker. placement due to second-degree heart block.  I last saw her on April 30 for routine follow-up History of well-controlled diabetes, hypertension, hyperlipidemia, stroke, OSA, obesity, pulmonary hypertension, aortic stenosis She is followed by pulmonology, Dr. Annamaria Barrette for her pulmonary hypertension and sleep apnea Her cardiologist is Dr Linnell Richardson- he is following due to coronary calcium  in 95th percentile  Her electrophysiologist is Dr. Lawana Pray  Lab Results  Component Value Date   HGBA1C 6.3 04/27/2023   She contacted me on May 11th with concern of feeling very poorly, I directed her to the ER. She ended up being admitted and diagnosed with a intermittent second-degree AV block, she had a pacemaker placed She was in the hospital from May 11 through May 17 Pacemaker placed on May 16 She followed up with cardiology support for pacemaker wound check yesterday; will see Dr. Lawana Pray next week  Recommendations for Outpatient Follow-up:  Follow outpatient CBC/CMP  Follow with cards after pacemaker placement Follow moderate AS with cards Follow volume, started on lasix  Follow with PCP outpatient to resume ozempic  (not suspected as cause of presenting sx, but will defer resumption to outpatient setting)  History of present illness:  78 year old female history of type 2 diabetes, hypertension, sleep apnea presented to the ED with increasing dyspnea on exertion which she  noticed after increased dose of Ozempic . Patient with dyspnea on minimal exertion. Patient seen in the ED noted to be bradycardic with a 2-1 AV block with prolonged PR up to 450 MS. It was noted that while ambulating in the ER patient's heart rate did go up into the 70s. Patient admitted for further evaluation. Cardiology consulted.  She had extensive cardiac workup, ultimately undergoing pacemaker placement due to intermittent second degree AV block.   Improved at the time of discharge. See below and prior notes for additional details    Hospital Course:  Assessment and Plan: 2:1 AV block Symptomatic Bradycardia Appreciate cards assistance S/p L/RHC with angiographically normal coronary arteries - consider nonischemic etiology for patient's symptoms, eval bradycardia Now s/p pacemaker implantation NSVT metop Aortic Valve Stenosis Cards follow up Dyslipidemia Simvastatin  Hypertension losartan  Hx CVA Aspirin , plavix   OSA Nightly CPAP T2DM SSI On metformin  prior to admission Unlikely ozempic  related based on her symptoms - follow with PCP prior to resuming med E. Coli UTI Complete course of duricef, 1 more dose tonight Obesity Body mass index is 45.45 kg/m.   Her main issue right now is actually a very itchy rash.  Patient notes when she was in the ER they had to use EKG pads with latex due to not having latex free available. Jasmin [name] has history of latex allergy.  She developed some localized redness and itching at the site of pad placement in the hospital (she notes new EKG pads were eventually placed on her back as her abdomen was so itchy) but then things got much worse a couple  of days after discharge.  Today is Wednesday.  On Sunday she started on Lasix  and metoprolol  as she was instructed.  Later that day and into Monday she developed a severe rash all over her chest, abdomen, and back She was seen by cardiology support staff yesterday for a pacemaker check-has not yet seen  her provider.  Cardiology had her stop the Lasix  and metoprolol  in case they were the culprit and had her start on Benadryl 4 times daily.   Patient notes her rash is significantly better since yesterday.  Her itching is improved.  No angioedema, no wheezing, no shortness of breath Lab Results  Component Value Date   HGBA1C 6.3 04/27/2023   We discussed Ozempic , I do not think Ozempic  triggered her heart block.  However she would feel more comfortable avoiding GLP-1 drugs which is certainly fine.  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   Obesity, Beginning BMI 46.86 03/15/2022   Elevated coronary artery calcium  score 02/24/2022   Left sided sciatica 07/05/2021   Skin cancer 11/04/2020   BMI 45.0-49.9, adult (HCC) 07/31/2020   Swelling of both lower extremities    Shortness of breath on exertion    Red eyes    Knee pain    Joint pain    Decreased hearing    Back pain    Nocturia 12/26/2018   Recurrent UTI 12/26/2018   Vaginal atrophy 12/26/2018   Hyperopia with astigmatism and presbyopia, bilateral 02/09/2018   Long term current use of oral hypoglycemic drug 02/09/2018   PCO (posterior capsular opacification), left 02/09/2018   Pseudophakia of left eye 02/09/2018   PVD (posterior vitreous detachment), both eyes 02/09/2018   Arthritis 12/16/2016   Right kidney stone    Hyperlipidemia    Heart murmur    Diabetes mellitus without complication (HCC)    History of  bilateral knee replacement 12/07/2016   Cystocele with rectocele 08/21/2016   Mixed hyperlipidemia 08/08/2016   Controlled type 2 diabetes mellitus without complication, without long-term current use of insulin  (HCC) 08/08/2016   Essential hypertension 08/08/2016   History of CVA (cerebrovascular accident) 08/08/2016   Nuclear sclerotic cataract of left eye 07/07/2016   OAB (overactive bladder) 01/20/2016   Class 3 severe obesity with serious comorbidity and body mass index (BMI) of 45.0 to 49.9 in adult 11/10/2015   IBS (irritable bowel syndrome) 07/16/2015   Insomnia 07/16/2015   Left ventricular hypertrophy 07/16/2015   Pulmonary hypertension (HCC) 07/16/2015   Primary osteoarthritis of left knee 03/31/2015   Acid reflux disease 03/09/2015   Aortic stenosis 03/09/2015   AR (allergic rhinitis) 03/09/2015   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   OSA on CPAP 03/09/2015   Osteoporosis 03/09/2015   Vitamin D  deficiency 03/09/2015   Left tibialis posterior tendinitis 12/13/2013    Past Medical History:  Diagnosis Date   Acid reflux disease 03/09/2015   Acute on chronic heart failure (HCC) 07/03/2023   Aortic stenosis 03/09/2015   AR (allergic rhinitis) 03/09/2015  Arthritis    Back pain    Controlled type 2 diabetes mellitus without complication, without long-term current use of insulin  (HCC) 08/08/2016   Cough    Cystocele with rectocele 08/21/2016   Decreased hearing    Diabetes mellitus without complication (HCC)    Dry mouth    Easy bruising    Essential hypertension 08/08/2016   Heart murmur    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   History of CVA (cerebrovascular accident) 08/08/2016   Seen on MRI from 08/2015   Hyperlipidemia    Hypertension    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   IBS (irritable bowel syndrome) 07/16/2015   Insomnia 07/16/2015   Joint pain    Knee pain    Left ventricular hypertrophy 07/16/2015   Leg cramping    right leg   Mixed  hyperlipidemia 08/08/2016   Morbid obesity (HCC) 11/10/2015   Nasal discharge    Nuclear sclerotic cataract of left eye 07/07/2016   OAB (overactive bladder) 01/20/2016   OSA (obstructive sleep apnea) 03/09/2015   Osteoporosis 03/09/2015   Primary osteoarthritis of left knee 03/31/2015   Pulmonary hypertension (HCC) 07/16/2015   Red eyes    Right kidney stone    Shortness of breath on exertion    Skin cancer 11/04/2020   Sleep apnea    Stroke (HCC)    Swelling of both lower extremities    UTI (urinary tract infection)    Vitamin D  deficiency 03/09/2015    Past Surgical History:  Procedure Laterality Date   ABDOMINAL HYSTERECTOMY  09/14/2016   CATARACT EXTRACTION Left    CATARACT EXTRACTION Left 06/2016   COLPORRHAPHY N/A 09/14/2016   [facility]   CYSTOCELE REPAIR     FRACTURE SURGERY Left 1953   L arm   FRACTURE SURGERY Left 1985   L ankle   HERNIA REPAIR  2004   JOINT REPLACEMENT Right 2012   R TKA   JOINT REPLACEMENT Left 2017   KIDNEY STONE SURGERY     LAPAROSCOPIC ASSISTED VAGINAL HYSTERECTOMY N/A 09/14/2016   [facility]   PACEMAKER IMPLANT N/A 07/07/2023   Procedure: PACEMAKER IMPLANT;  Surgeon: Lei Pump, MD;  Location: [facility] INVASIVE CV LAB;  Service: Cardiovascular;  Laterality: N/A;   R foot/ankle Right 2013 and 2014   plate and screws lateral foot and tendon removal medial foot in 2013, revision in 2014   RECTOCELE REPAIR     RIGHT/LEFT HEART CATH AND CORONARY ANGIOGRAPHY N/A 07/04/2023   Procedure: RIGHT/LEFT HEART CATH AND CORONARY ANGIOGRAPHY;  Surgeon: Arleen Lacer, MD;  Location: [facility] INVASIVE CV LAB;  Service: Cardiovascular;  Laterality: N/A;   URETERAL STENT PLACEMENT     URETEROSCOPY      Social History   Tobacco Use   Smoking status: Never    Passive exposure: Never   Smokeless tobacco: Never  Vaping Use   Vaping status: Never Used  Substance Use Topics   Alcohol use: Yes    Alcohol/week: 2.0 - 3.0 standard drinks of alcohol    Types: 2 - 3 Glasses of  wine per week    Comment: couple of glasses per week   Drug use: No    Family History  Problem Relation Age of Onset   Hyperlipidemia Mother    Heart disease Mother    Hypertension Mother    Stroke Mother    Thyroid  disease Mother    Hypertension Father    Heart disease Father     Allergies  Allergen Reactions  Latex Itching   Ciprofloxacin Hives   Metronidazole Hives   Tape Rash and Itching    Redness of skin    Medication list has been reviewed and updated.  Current Outpatient Medications on File Prior to Visit  Medication Sig Dispense Refill   Accu-Chek Softclix Lancets lancets USE AS DIRECTED TO CHECK BLOOD GLUCOSE ONCE DAILY 100 each 3   Acetaminophen  (TYLENOL  ARTHRITIS PAIN PO) Take 650 mg by mouth every 8 (eight) hours as needed (PAIN).     amoxicillin  (AMOXIL ) 500 MG tablet Take 500 mg by mouth 2 (two) times daily. 1 hr prior to dentist visit     Ascorbic Acid  (VITAMIN C  WITH ROSE HIPS) 500 MG tablet Take 500 mg by mouth daily.     aspirin  EC 81 MG tablet Take 1 tablet (81 mg total) by mouth daily. Swallow whole. 120 tablet 3   blood glucose meter kit and supplies KIT Dispense based on patient and insurance preference. Use once daily to monitor glucose as needed (FOR ICD-9 250.00, 250.01). (Patient taking differently: Inject 1 each into the skin as directed. Dispense based on patient and insurance preference. Use once daily to monitor glucose as needed (FOR ICD-9 250.00, 250.01).) 1 each 0   CALCIUM  PO Take 1 tablet by mouth daily.     Cholecalciferol (VITAMIN D3) 2000 units TABS Take 1 tablet by mouth in the morning and at bedtime.     clopidogrel  (PLAVIX ) 75 MG tablet Take 1 tablet (75 mg total) by mouth daily. 90 tablet 0   clotrimazole -betamethasone  (LOTRISONE ) cream Apply to plantar foot daily 45 g 3   CRANBERRY EXTRACT PO Take 25,000 mg by mouth 2 (two) times daily.     cycloSPORINE  (RESTASIS ) 0.05 % ophthalmic emulsion Place 1 drop into both eyes 2 (two) times  daily. 16.5 mL 3   cycloSPORINE  (RESTASIS ) 0.05 % ophthalmic emulsion Place 1 drop into both eyes 2 (two) times daily. 60 each 2   desmopressin  (DDAVP ) 0.2 MG tablet Take 1 tablet (0.2 mg total) by mouth at bedtime. 90 tablet 3   desonide  (DESOWEN ) 0.05 % cream Apply as directed to affected area. 15 g 0   econazole nitrate 1 % cream Apply 1 Application topically as needed (skin irritation).     estradiol  (ESTRACE ) 0.1 MG/GM vaginal cream Apply 0.5 grams vaginally nightly for 2 weeks, then apply 0.5 grams 2 times weekly 42.5 g 3   fluticasone  (FLONASE ) 50 MCG/ACT nasal spray Place 2 sprays into both nostrils daily. 48 g 3   glucose blood test strip Use as directed to check blood glucose daily. 100 each 12   losartan  (COZAAR ) 50 MG tablet Take 1 & 1/2 tablets (75 mg total) by mouth daily. 135 tablet 3   metFORMIN  (GLUCOPHAGE ) 500 MG tablet Take 1 tablet (500 mg total) by mouth daily with breakfast. 90 tablet 3   mirabegron  ER (MYRBETRIQ ) 50 MG TB24 tablet Take 1 tablet (50 mg total) by mouth daily. 90 tablet 3   omega-3 acid ethyl esters (LOVAZA ) 1 g capsule Take 2 capsules (2 g total) by mouth 2 (two) times daily. 360 capsule 1   OXYCODONE  HCL PO Take 1 tablet by mouth as needed (pain).     pantoprazole  (PROTONIX ) 40 MG tablet Take 1 tablet (40 mg total) by mouth 2 (two) times daily. 180 tablet 3   Polyethyl Glycol-Propyl Glycol (SYSTANE OP) Apply 1 drop to eye daily as needed (for dry eyes).     PSYLLIUM PO Take 1  tablet by mouth daily.     simvastatin  (ZOCOR ) 20 MG tablet Take 1 tablet (20 mg total) by mouth at bedtime. 90 tablet 1   solifenacin  (VESICARE ) 10 MG tablet Take 1 tablet (10 mg total) by mouth daily. Swallow whole do not crush, chew, or split. 30 tablet 3   furosemide  (LASIX ) 20 MG tablet Take 1 tablet (20 mg total) by mouth daily. Follow with cardiology outpatient for management of your lasix  (Patient not taking: Reported on 07/12/2023) 30 tablet 1   metoprolol  succinate (TOPROL -XL)  25 MG 24 hr tablet Take 1 tablet (25 mg total) by mouth daily. (Patient not taking: Reported on 07/12/2023) 30 tablet 1   No current facility-administered medications on file prior to visit.    Review of Systems:  As per HPI- otherwise negative.   Physical Examination: Vitals:   07/12/23 1003  BP: 118/62  Pulse: 64  SpO2: 97%   Vitals:   07/12/23 1003  Weight: 238 lb 12.8 oz (108.3 kg)  Height: 5\' 1"  (1.549 m)   Body mass index is 45.12 kg/m. Ideal Body Weight: Weight in (lb) to have BMI = 25: 132  GEN: no acute distress.  Obese, looks well HEENT: Atraumatic, Normocephalic.  No angioedema Pacemaker insertion site appears in good repair and not infected Ears and Nose: No external deformity. CV: RRR, No M/G/R. No JVD. No thrill. No extra heart sounds. PULM: CTA B, no wheezes, crackles, rhonchi. No retractions. No resp. distress. No accessory muscle use. EXTR: No c/c/e PSYCH: Normally interactive. Conversant.  She has a diffuse erythematous rash most pronounced over her chest and abdomen, some on her back.  Circular marks from EKG pads are apparent-this is where the rash is worst   Assessment and Plan: Rash  Controlled type 2 diabetes mellitus without complication, without long-term current use of insulin  (HCC) - Plan: metFORMIN  (GLUCOPHAGE ) 500 MG tablet  2nd degree AV block  Patient seen today for follow-up from recent pacemaker placement.  As above, she developed a second-degree heart block shortly after starting Ozempic .  I doubt Ozempic  is the cause but she prefers to stop this medication which is fine.  From a pacemaker standpoint she is doing well, her site is healing nicely and she feels much better.  She will see electrophysiology next week  Her most pressing issue today is a diffuse itchy rash.  This may be related to allergy to either Lasix  or metoprolol ; however, it is also certainly possible this was triggered by allergy to latex.  Patient was exposed to latex  in the hospital in addition to other potentially irritating topical agents during the course of her admission  She notes significant improvement over the last 24 hours with Benadryl.  Will have her continue scheduled Benadryl, add a nonsedating antihistamine and H2 blocker for the next 1 to 2 weeks.  She can taper Benadryl as tolerated over the next several days.  Assuming she continues to improve rapidly I think all will be well.  If her symptoms get worse again we can consider using steroids  CMP, CBC on chart from 5/16-will defer blood work until she is seen by cardiology next week  Signed Gates Kasal, MD

## 2023-07-19 NOTE — Patient Instructions (Signed)
   After Your Pacemaker   Monitor your pacemaker site for redness, swelling, and drainage. Call the device clinic at [phone] if you experience these symptoms or fever/chills.  Your incision was closed with Steri-strips or staples:  You may shower 7 days after your procedure and wash your incision with soap and water. Avoid lotions, ointments, or perfumes over your incision until it is well-healed.  You may use a hot tub or a pool after your wound check appointment if the incision is completely closed.  Do not lift, push or pull greater than 10 pounds with the affected arm until 6 weeks after your procedure.  UNTIL AFTER JUNE 27TH.  There are no other restrictions in arm movement after your wound check appointment.  You may drive, unless driving has been restricted by your healthcare providers.  Remote monitoring is used to monitor your pacemaker from home. This monitoring is scheduled every 91 days by our office. It allows us  to keep an eye on the functioning of your device to ensure it is working properly. You will routinely see your Electrophysiologist annually (more often if necessary).

## 2023-07-20 ENCOUNTER — Other Ambulatory Visit (HOSPITAL_BASED_OUTPATIENT_CLINIC_OR_DEPARTMENT_OTHER): Payer: Self-pay

## 2023-07-20 ENCOUNTER — Ambulatory Visit: Attending: Internal Medicine

## 2023-07-20 DIAGNOSIS — I442 Atrioventricular block, complete: Secondary | ICD-10-CM

## 2023-07-20 NOTE — Progress Notes (Signed)
 Normal DUAL chamber pacemaker wound check. Presenting rhythm: AS/VS 83 . Wound well healed. Routine testing performed. Thresholds, sensing, and impedances consistent with implant measurements and at 3.5V safety margin/auto capture until 3 month visit. No episodes. Reviewed arm restrictions to continue for 6 weeks total post op.  Pt enrolled in remote follow-up.  Rash from allergic reaction has healed, patient is doing well.  Will send a phone note to Dr. Lawana Pray to review whether to restart Metoprolol  or Lasix  or if other alternatives should be considered versus continued monitoring. No arrhythmias present and histograms show appropriate heart rate control on device since implant, no evidence of fluid retention.

## 2023-07-21 ENCOUNTER — Telehealth: Payer: Self-pay

## 2023-07-21 NOTE — Telephone Encounter (Signed)
 I called and spoke with the patient. She is aware of Dr. Adell Age recommendations to hold off on restarting lasix  and metoprolol  if she is feeling ok. Per the patient, she is feeling "just fine," and will hold off on restarting lasix  and metoprolol .   I have advised the patient that should either one of these medications be mentioned again in the future as being restarted, to make sure she only starts one a a time to make sure she is not having another reaction.  The patient voices understanding and is agreeable.   She was very appreciative of the call.

## 2023-07-21 NOTE — Telephone Encounter (Signed)
 Patient in yesterday for her 14 day device post implant follow up.  She is doing very well and rash has resolved since visit on 5/20.   Concern at the time was that wide spread/systemic rash possibly was a result of either Lasix  or Metoprolol  that was started on 07/09/23.  She has not restarted either medication and is wondering if she should do so?   Device interrogation yesterday was normal. No episodes and heart rates on histograms show overall good control.  Patient denies any signs of fluid retention or SOB on exertion.

## 2023-07-25 ENCOUNTER — Other Ambulatory Visit (HOSPITAL_BASED_OUTPATIENT_CLINIC_OR_DEPARTMENT_OTHER): Payer: Self-pay

## 2023-07-26 ENCOUNTER — Encounter: Payer: Self-pay | Admitting: *Deleted

## 2023-07-26 ENCOUNTER — Telehealth: Payer: Self-pay | Admitting: *Deleted

## 2023-07-26 NOTE — Telephone Encounter (Signed)
-----   Message from Ralene Burger sent at 07/21/2023  2:12 PM EDT ----- Regarding: RE: Provider Switch Sure, no problem I understand ----- Message ----- From: Annemarie Barry, RN Sent: 07/20/2023   9:42 AM EDT To: Maudine Sos, MD; Manfred Seed, MD; # Subject: Provider Switch                                Patient seen in Device Clinic today for a follow up wound check. Doing really well!  She mentioned that Dr. Theodis Fiscal followed along with her during her hospitalization.  She is requesting a switch from Dr. Gordan Latina to Dr. Theodis Fiscal- she lives in [place], but is not far from [employer].   She felt more comfortable with the general cardiology team being onsite at [name] every day vs the [facility] office being there less often.   Advised the patient I would forward a message to the both of you to request ok the switch.   Thank you!

## 2023-07-26 NOTE — Telephone Encounter (Signed)
 Staff message forwarded to [facility] staff to arrange follow up there in July.  A My Chart message was sent to the patient.

## 2023-07-28 ENCOUNTER — Telehealth (HOSPITAL_BASED_OUTPATIENT_CLINIC_OR_DEPARTMENT_OTHER): Payer: Self-pay | Admitting: Cardiovascular Disease

## 2023-07-28 NOTE — Telephone Encounter (Signed)
 Patient scheduled to see Neomi Banks on 07/01 at 8:25AM

## 2023-07-28 NOTE — Telephone Encounter (Signed)
 ----- Message from Ralene Burger sent at 07/28/2023  9:09 AM EDT ----- Regarding: RE: Provider Switch I am fine with ----- Message ----- From: Alvis Jourdain Sent: 07/27/2023   1:44 PM EDT To: Manfred Seed, MD Subject: RE: Provider Switch                            Dr. Krasowski, are you OK with the switch? ----- Message ----- From: Guss Legacy, RN Sent: 07/27/2023  10:03 AM EDT To: [facility] Scheduling Subject: FW: Provider Switch                            Please schedule pt. Can either be put on waitlist for Dr. Theodis Fiscal or with [facility] Morgan. ----- Message ----- From: Annemarie Barry, RN Sent: 07/26/2023  11:22 AM EDT To: Zigmund Hills, LPN Subject: RE: Provider Switch                            OK- or can she get in to see [facility] in July. We told her everyone there is wonderful :-) ----- Message ----- From: Zigmund Hills, LPN Sent: 02/26/1094  11:06 AM EDT To: Annemarie Barry, RN Subject: RE: Provider Switch                            We can get her added to cancellation list but unfortunately Dr Theodis Fiscal is only in the office like 5 or 6 1/4 days and couple full days (adv htn clinic) for like 8 weeks over summer. She has nothing as long as the schedule is open ----- Message ----- From: Annemarie Barry, RN Sent: 07/26/2023   8:45 AM EDT To: Annemarie Barry, RN; Zigmund Hills, LPN Subject: FW: Provider Switch                            Hey Melinda!  Who could I send this message to to help get this patient's July appointment changed from [place] with Dr. Gordan Latina to Dr. Theodis Fiscal.    Thanks! ----- Message ----- From: Maudine Sos, MD Sent: 07/25/2023   6:41 PM EDT To: Manfred Seed, MD; Annemarie Barry, RN Subject: RE: Provider Switch                            OK with me.   TCR ----- Message ----- From: Annemarie Barry, RN Sent: 07/20/2023   9:42 AM EDT To: Maudine Sos, MD; Manfred Seed, MD; # Subject: Provider Switch                                 Patient seen in Device Clinic today for a follow up wound check. Doing really well!  She mentioned that Dr. Theodis Fiscal followed along with her during her hospitalization.  She is requesting a switch from Dr. Gordan Latina to Dr. Theodis Fiscal- she lives in [place], but is not far from [place].   She felt more comfortable with the general cardiology team being onsite at [facility] every day vs the [facility] office being there less often.   Advised the patient I would forward a message to  the both of you to request ok the switch.   Thank you!

## 2023-08-09 ENCOUNTER — Ambulatory Visit: Payer: Medicare PPO | Admitting: Family Medicine

## 2023-08-09 NOTE — Patient Instructions (Addendum)
 Good to see you again today - please see me in about 3 months and have a wonderful  summer

## 2023-08-09 NOTE — Progress Notes (Signed)
 [facility] [address], [address] [place], [place] [address] [phone]  Date:  08/16/2023   Name:  Jasmin [name]   DOB:  1945/06/14   MRN:  [id number]  PCP:  Jasmin Harlene BROCKS, MD    Chief Complaint: Follow-up (Pt would like to know how much calcium  she should be taking 1 or 2 tablets )   History of Present Illness:  Jasmin [name] is a 78 y.o. very pleasant female patient who presents with the following:  Pt seen today for a 6 month recheck Last seen by myself last month when she had a rash and very recently had a pacemaker placed due to heart block History of well-controlled diabetes, hypertension, hyperlipidemia, stroke, OSA, obesity, pulmonary hypertension, aortic stenosis She is followed by pulmonology, Dr. Nathanael for her pulmonary hypertension and sleep apnea Her cardiologist is Dr Jasmin [name]- he is following due to coronary calcium  in 95th percentile  Her electrophysiologist is Dr. Inocencio  When I saw her last month she had a diffuse erythematous rash most pronounced over her chest and abdomen, some on her back.  Circular marks from EKG pads are apparent-this is where the rash is worst -------------- Patient seen today for follow-up from recent pacemaker placement.  As above, she developed a second-degree heart block shortly after starting Ozempic .  I doubt Ozempic  is the cause but she prefers to stop this medication which is fine. From a pacemaker standpoint she is doing well, her site is healing nicely and she feels much better.  She will see electrophysiology next week Her most pressing issue today is a diffuse itchy rash.  This may be related to allergy to either Lasix  or metoprolol ; however, it is also certainly possible this was triggered by allergy to latex.  Patient was exposed to latex in the hospital in addition to other potentially irritating topical agents during the course of her admission She notes significant  improvement over the last 24 hours with Benadryl.  Will have her continue scheduled Benadryl, add a nonsedating antihistamine and H2 blocker for the next 1 to 2 weeks.  She can taper Benadryl as tolerated over the next several days.  Assuming she continues to improve rapidly I think all will be well.  If her symptoms get worse again we can consider using steroids  Her rash did clear up and she is currently feeling quite well.  She is able to be more active and is trying to get at least 8000 steps a day-typically she goes to the gym for exercise Lab Results  Component Value Date   HGBA1C 6.3 04/27/2023  She is seeing cardiology in July She will see Dr Alvia in August -she notes her bladder control symptoms are fairly well-controlled with medication  Wt Readings from Last 3 Encounters:  08/16/23 243 lb 12.8 oz (110.6 kg)  07/12/23 238 lb 12.8 oz (108.3 kg)  07/08/23 240 lb 8.4 oz (109.1 kg)      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   SCC (squamous cell carcinoma), arm, left  03/16/2022   Obesity, Beginning BMI 46.86 03/15/2022   Elevated coronary artery calcium  score 02/24/2022   Left sided sciatica 07/05/2021   Skin cancer 11/04/2020   BMI 45.0-49.9, adult (HCC) 07/31/2020   Swelling of both lower extremities    Shortness of breath on exertion    Red eyes    Knee pain    Joint pain    Decreased hearing    Back pain    Nocturia 12/26/2018   Recurrent UTI 12/26/2018   Vaginal atrophy 12/26/2018   Hyperopia with astigmatism and presbyopia, bilateral 02/09/2018    Long term current use of oral hypoglycemic drug 02/09/2018   PCO (posterior capsular opacification), left 02/09/2018   Pseudophakia of left eye 02/09/2018   PVD (posterior vitreous detachment), both eyes 02/09/2018   Arthritis 12/16/2016   Right kidney stone    Hyperlipidemia    Heart murmur    Diabetes mellitus without complication (HCC)    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   Cystocele with rectocele 08/21/2016   Mixed hyperlipidemia 08/08/2016   Controlled type 2 diabetes mellitus without complication, without long-term current use of insulin  (HCC) 08/08/2016   Essential hypertension 08/08/2016   History of CVA (cerebrovascular accident) 08/08/2016   Nuclear sclerotic cataract of left eye 07/07/2016   OAB (overactive bladder) 01/20/2016   Class 3 severe obesity with serious comorbidity and body mass index (BMI) of 45.0 to 49.9 in adult 11/10/2015   IBS (irritable bowel syndrome) 07/16/2015   Insomnia 07/16/2015   Left ventricular hypertrophy 07/16/2015   Pulmonary hypertension (HCC) 07/16/2015   Primary osteoarthritis of left knee 03/31/2015   Acid reflux disease 03/09/2015   Aortic stenosis 03/09/2015   AR (allergic rhinitis) 03/09/2015   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   OSA on CPAP 03/09/2015   Osteoporosis 03/09/2015   Vitamin D  deficiency 03/09/2015   Left tibialis posterior tendinitis 12/13/2013    Past Medical History:  Diagnosis Date   Acid reflux disease 03/09/2015   Acute on chronic heart failure (HCC) 07/03/2023   Aortic stenosis 03/09/2015   AR (allergic rhinitis) 03/09/2015   Arthritis    Back pain    Controlled type 2 diabetes mellitus without complication, without long-term current use of insulin  (HCC) 08/08/2016   Cough    Cystocele with rectocele 08/21/2016   Decreased hearing    Diabetes mellitus without complication (HCC)    Dry mouth    Easy bruising    Essential hypertension 08/08/2016   Heart murmur    History of bilateral knee  replacement 12/07/2016   History of CVA (cerebrovascular accident) 08/08/2016   Seen on MRI from 08/2015   Hyperlipidemia    Hypertension    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   IBS (irritable bowel syndrome) 07/16/2015   Insomnia 07/16/2015   Joint pain    Knee pain    Left ventricular hypertrophy 07/16/2015   Leg cramping    right leg   Mixed hyperlipidemia 08/08/2016   Morbid obesity (HCC) 11/10/2015   Nasal discharge    Nuclear sclerotic cataract of left eye 07/07/2016   OAB (overactive bladder) 01/20/2016   OSA (obstructive sleep apnea) 03/09/2015   Osteoporosis 03/09/2015   Primary osteoarthritis of left knee 03/31/2015   Pulmonary hypertension (HCC) 07/16/2015   Red eyes    Right kidney stone    Shortness of breath on exertion    Skin cancer 11/04/2020   Sleep apnea    Stroke (HCC)    Swelling of both lower extremities  UTI (urinary tract infection)    Vitamin D  deficiency 03/09/2015    Past Surgical History:  Procedure Laterality Date   ABDOMINAL HYSTERECTOMY  09/14/2016   CATARACT EXTRACTION Left    CATARACT EXTRACTION Left 06/2016   COLPORRHAPHY N/A 09/14/2016   [facility]   CYSTOCELE REPAIR     FRACTURE SURGERY Left 1953   L arm   FRACTURE SURGERY Left 1985   L ankle   HERNIA REPAIR  2004   JOINT REPLACEMENT Right 2012   R TKA   JOINT REPLACEMENT Left 2017   KIDNEY STONE SURGERY     LAPAROSCOPIC ASSISTED VAGINAL HYSTERECTOMY N/A 09/14/2016   [facility]   PACEMAKER IMPLANT N/A 07/07/2023   Procedure: PACEMAKER IMPLANT;  Surgeon: Jasmin [name] Soyla Lunger, MD;  Location: [facility] INVASIVE CV LAB;  Service: Cardiovascular;  Laterality: N/A;   R foot/ankle Right 2013 and 2014   plate and screws lateral foot and tendon removal medial foot in 2013, revision in 2014   RECTOCELE REPAIR     RIGHT/LEFT HEART CATH AND CORONARY ANGIOGRAPHY N/A 07/04/2023   Procedure: RIGHT/LEFT HEART CATH AND CORONARY ANGIOGRAPHY;  Surgeon: Anner Alm ORN, MD;  Location: [facility] INVASIVE CV LAB;  Service:  Cardiovascular;  Laterality: N/A;   URETERAL STENT PLACEMENT     URETEROSCOPY      Social History   Tobacco Use   Smoking status: Never    Passive exposure: Never   Smokeless tobacco: Never  Vaping Use   Vaping status: Never Used  Substance Use Topics   Alcohol use: Yes    Alcohol/week: 2.0 - 3.0 standard drinks of alcohol    Types: 2 - 3 Glasses of wine per week    Comment: couple of glasses per week   Drug use: No    Family History  Problem Relation Age of Onset   Hyperlipidemia Mother    Heart disease Mother    Hypertension Mother    Stroke Mother    Thyroid  disease Mother    Hypertension Father    Heart disease Father     Allergies  Allergen Reactions   Latex Itching   Ciprofloxacin Hives   Lasix  [Furosemide ]     MAY have caused a rash- also consider lasix  allergy trigger.  Likely ok to try lasix  or a similar med if needed in the future    Metoprolol      MAY have caused a rash vs latex allergy during hospital admission. Likely reasonable to try a BB again if needed later    Metronidazole Hives   Tape Rash and Itching    Redness of skin    Medication list has been reviewed and updated.  Current Outpatient Medications on File Prior to Visit  Medication Sig Dispense Refill   Accu-Chek Softclix Lancets lancets USE AS DIRECTED TO CHECK BLOOD GLUCOSE ONCE DAILY 100 each 3   Acetaminophen  (TYLENOL  ARTHRITIS PAIN PO) Take 650 mg by mouth every 8 (eight) hours as needed (PAIN).     amoxicillin  (AMOXIL ) 500 MG tablet Take 500 mg by mouth 2 (two) times daily. 1 hr prior to dentist visit     Ascorbic Acid  (VITAMIN C  WITH ROSE HIPS) 500 MG tablet Take 500 mg by mouth daily.     aspirin  EC 81 MG tablet Take 1 tablet (81 mg total) by mouth daily. Swallow whole. 120 tablet 3   blood glucose meter kit and supplies KIT Dispense based on patient and insurance preference. Use once daily to monitor glucose as needed (FOR ICD-9  250.00, 250.01). (Patient taking differently: Inject  1 each into the skin as directed. Dispense based on patient and insurance preference. Use once daily to monitor glucose as needed (FOR ICD-9 250.00, 250.01).) 1 each 0   CALCIUM  PO Take 1 tablet by mouth daily.     Cholecalciferol (VITAMIN D3) 2000 units TABS Take 1 tablet by mouth in the morning and at bedtime.     clopidogrel  (PLAVIX ) 75 MG tablet Take 1 tablet (75 mg total) by mouth daily. 90 tablet 0   clotrimazole -betamethasone  (LOTRISONE ) cream Apply to plantar foot daily 45 g 3   CRANBERRY EXTRACT PO Take 25,000 mg by mouth 2 (two) times daily.     cycloSPORINE  (RESTASIS ) 0.05 % ophthalmic emulsion Place 1 drop into both eyes 2 (two) times daily. 16.5 mL 3   cycloSPORINE  (RESTASIS ) 0.05 % ophthalmic emulsion Place 1 drop into both eyes 2 (two) times daily. 60 each 2   desmopressin  (DDAVP ) 0.2 MG tablet Take 1 tablet (0.2 mg total) by mouth at bedtime. 90 tablet 3   desonide  (DESOWEN ) 0.05 % cream Apply as directed to affected area. 15 g 0   econazole nitrate 1 % cream Apply 1 Application topically as needed (skin irritation).     estradiol  (ESTRACE ) 0.1 MG/GM vaginal cream Apply 0.5 grams vaginally nightly for 2 weeks, then apply 0.5 grams 2 times weekly 42.5 g 3   fluticasone  (FLONASE ) 50 MCG/ACT nasal spray Place 2 sprays into both nostrils daily. 48 g 3   glucose blood test strip Use as directed to check blood glucose daily. 100 each 12   losartan  (COZAAR ) 50 MG tablet Take 1 & 1/2 tablets (75 mg total) by mouth daily. 135 tablet 3   metFORMIN  (GLUCOPHAGE ) 500 MG tablet Take 1 tablet (500 mg total) by mouth 2 (two) times daily with a meal. 180 tablet 3   mirabegron  ER (MYRBETRIQ ) 50 MG TB24 tablet Take 1 tablet (50 mg total) by mouth daily. 90 tablet 3   omega-3 acid ethyl esters (LOVAZA ) 1 g capsule Take 2 capsules (2 g total) by mouth 2 (two) times daily. 360 capsule 1   OXYCODONE  HCL PO Take 1 tablet by mouth as needed (pain).     pantoprazole  (PROTONIX ) 40 MG tablet Take 1 tablet (40  mg total) by mouth 2 (two) times daily. 180 tablet 3   Polyethyl Glycol-Propyl Glycol (SYSTANE OP) Apply 1 drop to eye daily as needed (for dry eyes).     PSYLLIUM PO Take 1 tablet by mouth daily.     simvastatin  (ZOCOR ) 20 MG tablet Take 1 tablet (20 mg total) by mouth at bedtime. 90 tablet 1   solifenacin  (VESICARE ) 10 MG tablet Take 1 tablet (10 mg total) by mouth daily. Swallow whole do not crush, chew, or split. 30 tablet 3   No current facility-administered medications on file prior to visit.    Review of Systems:  As per HPI- otherwise negative.  Physical Examination: Vitals:   08/16/23 0902  BP: 130/72  Pulse: 84  SpO2: 98%   Vitals:   08/16/23 0902  Weight: 243 lb 12.8 oz (110.6 kg)  Height: 5' 1 (1.549 m)   Body mass index is 46.07 kg/m. Ideal Body Weight: Weight in (lb) to have BMI = 25: 132  GEN: no acute distress.  Obese, looks well HEENT: Atraumatic, Normocephalic.  Ears and Nose: No external deformity. CV: RRR, No M/G/R. No JVD. No thrill. No extra heart sounds. PULM: CTA B, no wheezes, crackles,  rhonchi. No retractions. No resp. distress. No accessory muscle use. ABD: S, NT, ND EXTR: No c/c/e PSYCH: Normally interactive. Conversant.  Pacemaker site over left chest is well-healed  Assessment and Plan: Controlled type 2 diabetes mellitus without complication, without long-term current use of insulin  (HCC)  2nd degree AV block  Mixed hyperlipidemia  Hypertonicity of bladder  Patient seen today for follow-up.  We checked her diabetes control in March, A1c looks very good-we decided to defer recheck A1c for another 3 months.   Her second-degree AV block has been treated successfully with a pacemaker Her bladder symptoms are treated with Myrbetriq , patient notes improvement  For now things are stable, plan to follow-up in 3 months for diabetes check and then will hopefully get her back on a 84-month schedule  Signed Harlene Schroeder, MD

## 2023-08-13 ENCOUNTER — Other Ambulatory Visit (HOSPITAL_BASED_OUTPATIENT_CLINIC_OR_DEPARTMENT_OTHER): Payer: Self-pay

## 2023-08-14 ENCOUNTER — Other Ambulatory Visit: Payer: Self-pay

## 2023-08-14 ENCOUNTER — Other Ambulatory Visit (HOSPITAL_BASED_OUTPATIENT_CLINIC_OR_DEPARTMENT_OTHER): Payer: Self-pay

## 2023-08-14 MED ORDER — DESONIDE 0.05 % EX CREA
TOPICAL_CREAM | CUTANEOUS | 0 refills | Status: DC
  Filled 2023-08-14: qty 15, 30d supply, fill #0

## 2023-08-16 ENCOUNTER — Ambulatory Visit: Admitting: Family Medicine

## 2023-08-16 ENCOUNTER — Encounter: Payer: Self-pay | Admitting: Family Medicine

## 2023-08-16 VITALS — BP 130/72 | HR 84 | Ht 61.0 in | Wt 243.8 lb

## 2023-08-16 DIAGNOSIS — E782 Mixed hyperlipidemia: Secondary | ICD-10-CM | POA: Diagnosis not present

## 2023-08-16 DIAGNOSIS — N318 Other neuromuscular dysfunction of bladder: Secondary | ICD-10-CM | POA: Diagnosis not present

## 2023-08-16 DIAGNOSIS — E119 Type 2 diabetes mellitus without complications: Secondary | ICD-10-CM | POA: Diagnosis not present

## 2023-08-16 DIAGNOSIS — I441 Atrioventricular block, second degree: Secondary | ICD-10-CM | POA: Diagnosis not present

## 2023-08-21 ENCOUNTER — Ambulatory Visit: Payer: Self-pay | Admitting: Cardiology

## 2023-08-21 ENCOUNTER — Other Ambulatory Visit (HOSPITAL_BASED_OUTPATIENT_CLINIC_OR_DEPARTMENT_OTHER): Payer: Self-pay

## 2023-08-21 ENCOUNTER — Encounter

## 2023-08-21 ENCOUNTER — Other Ambulatory Visit: Payer: Self-pay

## 2023-08-21 LAB — CUP PACEART REMOTE DEVICE CHECK
Battery Remaining Longevity: 132 mo
Battery Remaining Percentage: 95.5 %
Battery Voltage: 3.05 V
Brady Statistic AP VP Percent: 1 %
Brady Statistic AP VS Percent: 3.1 %
Brady Statistic AS VP Percent: 1 %
Brady Statistic AS VS Percent: 97 %
Brady Statistic RA Percent Paced: 3 %
Brady Statistic RV Percent Paced: 1 %
Date Time Interrogation Session: 20250630035859
Implantable Lead Connection Status: 753985
Implantable Lead Connection Status: 753985
Implantable Lead Implant Date: 20250516
Implantable Lead Implant Date: 20250516
Implantable Lead Location: 753859
Implantable Lead Location: 753860
Implantable Pulse Generator Implant Date: 20250516
Lead Channel Impedance Value: 400 Ohm
Lead Channel Impedance Value: 540 Ohm
Lead Channel Pacing Threshold Amplitude: 0.75 V
Lead Channel Pacing Threshold Amplitude: 0.875 V
Lead Channel Pacing Threshold Pulse Width: 0.5 ms
Lead Channel Pacing Threshold Pulse Width: 0.5 ms
Lead Channel Sensing Intrinsic Amplitude: 10.6 mV
Lead Channel Sensing Intrinsic Amplitude: 3.4 mV
Lead Channel Setting Pacing Amplitude: 1.125
Lead Channel Setting Pacing Amplitude: 1.75 V
Lead Channel Setting Pacing Pulse Width: 0.5 ms
Lead Channel Setting Sensing Sensitivity: 2 mV
Pulse Gen Model: 2272
Pulse Gen Serial Number: 8276359

## 2023-08-21 MED ORDER — PANTOPRAZOLE SODIUM 40 MG PO TBEC
40.0000 mg | DELAYED_RELEASE_TABLET | Freq: Two times a day (BID) | ORAL | 3 refills | Status: AC
  Filled 2023-08-21: qty 180, 90d supply, fill #0
  Filled 2023-11-16: qty 180, 90d supply, fill #1
  Filled 2024-02-19: qty 180, 90d supply, fill #2

## 2023-08-22 ENCOUNTER — Encounter (HOSPITAL_BASED_OUTPATIENT_CLINIC_OR_DEPARTMENT_OTHER): Payer: Self-pay | Admitting: Family

## 2023-08-22 ENCOUNTER — Ambulatory Visit (HOSPITAL_BASED_OUTPATIENT_CLINIC_OR_DEPARTMENT_OTHER): Admitting: Family

## 2023-08-22 VITALS — BP 136/68 | HR 89 | Ht 61.0 in | Wt 245.0 lb

## 2023-08-22 DIAGNOSIS — I77810 Thoracic aortic ectasia: Secondary | ICD-10-CM

## 2023-08-22 DIAGNOSIS — G4733 Obstructive sleep apnea (adult) (pediatric): Secondary | ICD-10-CM | POA: Diagnosis not present

## 2023-08-22 DIAGNOSIS — E785 Hyperlipidemia, unspecified: Secondary | ICD-10-CM | POA: Diagnosis not present

## 2023-08-22 DIAGNOSIS — I350 Nonrheumatic aortic (valve) stenosis: Secondary | ICD-10-CM | POA: Diagnosis not present

## 2023-08-22 DIAGNOSIS — I10 Essential (primary) hypertension: Secondary | ICD-10-CM

## 2023-08-22 DIAGNOSIS — Z950 Presence of cardiac pacemaker: Secondary | ICD-10-CM | POA: Diagnosis not present

## 2023-08-22 NOTE — Patient Instructions (Addendum)
 Medication Instructions:  Your physician recommends that you continue on your current medications as directed. Please refer to the Current Medication list given to you today.  Follow-Up:  Please follow up in 3-4 months with Dr. Raford  Other Instructions Please bring your blood pressure cuff to your next visit with Dr. Inocencio

## 2023-08-22 NOTE — Progress Notes (Signed)
 Cardiology Office Note   Date:  08/22/2023  ID:  Jasmin [name], DOB April 18, 1945, MRN [id number] PCP: Watt Harlene BROCKS, MD  [facility] Providers Cardiologist:  Lamar Fitch, MD     History of Present Illness Jasmin [name] is a 78 y.o. 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 , HLD, obesity.   Admitted 5/11-5/17/25 with 2-1 AV block and PR up to . L/RHC with normal coronary arteries. Underwent PPM. Echo LVEF 60-65%, moderate asymmetric LVH, gr2dd, mild LAE, trivial MR, mild dilation ascending aorta 40mm, moderate aortic stenosis.   Discussed the use of AI scribe software for clinical note transcription with the patient, who gave verbal consent to proceed.  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 She is accompanied by her husband.  She has been feeling well since the pacemaker placement six weeks ago and is eager to resume activities, including cardiac exercises. She experiences no significant chest pain or shortness of breath during walking, though faster walking can cause some shortness of breath.  Initially, there was a rash and irritation at the pacemaker site, which has improved after discontinuation of Metoprolol  and Lasix .   She monitors her blood pressure at home, with readings around 130/80 mmHg. She takes losartan  in the evening for blood pressure management. She has not yet taken her morning medications, which include non-prescription supplements like calcium  and vitamin C .   ROS: Please see the history of present illness.    All other systems reviewed and are negative.   Studies Reviewed      Cardiac Studies & Procedures   ______________________________________________________________________________________________ CARDIAC CATHETERIZATION  CARDIAC CATHETERIZATION 07/04/2023  Conclusion Images from the original result were not  included. Dominance: Right  POST-OPERATIVE DIAGNOSIS: Angiographically normal coronary arteries with a codominant system Very tortuous innominate artery making it very difficult to engage the RCA-nonselective images obtained. Relatively Normal Right Heart Cath Numbers: RAP mean 8 mmHg; RV P-EDP 37/0-13 mmHg; PAP-mean 38/21-22 mmHg with a PCWP of 24 mmHg. AO P-MAP 130/39-65 mmHg; LV P-EDP 164/13-19 mmHg. Ao sat 93%, PA sat 63%.  Cardiac output-index That (Fick) 5.57-2.74. ~Moderate aortic stenosis with mean AVG estimated 21 mmHg  PLAN OF CARE: Return to nursing unit for ongoing care and continued management. .  Anticipated discharge date to be determined.   Consider nonischemic etiology for the patient's symptoms. Likely need to evaluate bradycardia.    Alm Clay, MD  Findings Coronary Findings Diagnostic  Dominance: Right  Left Main Vessel was injected. Vessel is normal in caliber. Vessel is angiographically normal.  Left Anterior Descending Vessel was injected. Vessel is normal in caliber. Vessel is angiographically normal. Dist LAD lesion is 30% stenosed.  First Diagonal Branch Vessel is moderate in size. Diagonal branch is larger than LAD  Lateral First Diagonal Branch Vessel is small in size.  Left Circumflex Vessel was injected. Vessel is normal in caliber and large. Vessel is angiographically normal.  First Obtuse Marginal Branch Vessel is large in size.  Second Obtuse Marginal Branch Vessel is small in size.  First Left Posterolateral Branch Vessel is moderate in size.  Second Left Posterolateral Branch Vessel is moderate in size.  Right Coronary Artery Vessel was visualized by non-selective angiography. Vessel is large. Vessel is angiographically normal.  Right Posterior Atrioventricular Artery Vessel is small in size.  First Right Posterolateral Branch Vessel is small in size.  Intervention  No interventions have been documented.  STRESS  TESTS  MYOCARDIAL PERFUSION IMAGING 03/03/2022  Narrative   The study is normal. The study is low risk.   No ST deviation was noted.   LV perfusion is normal.   Left ventricular function is normal. Nuclear stress EF: 71 %. The left ventricular ejection fraction is hyperdynamic (>65%). End diastolic cavity size is normal. End systolic cavity size is normal.  Normal perfusion. LVEF 71% with normal wall motion. This is a low risk study. No prior for comparison.   ECHOCARDIOGRAM  ECHOCARDIOGRAM COMPLETE 07/03/2023  Narrative ECHOCARDIOGRAM REPORT    Patient Name:   Jasmin [name] Date of Exam: 07/03/2023 Medical Rec #:  [id number]       Height:       61.0 in Accession #:    [id number]      Weight:       244.4 lb Date of Birth:  [birth year]       BSA:          2.057 m Patient Age:    78 years        BP:           118/75 mmHg Patient Gender: F               HR:           59 bpm. Exam Location:  Inpatient  Procedure: 2D Echo, Cardiac Doppler and Color Doppler (Both Spectral and Color Flow Doppler were utilized during procedure).  Indications:    Fatigue, heart block, shortness of breath  History:        Patient has prior history of Echocardiogram examinations.  Sonographer:    Philomena Daring Referring Phys: [id number] DANIEL V THOMPSON  IMPRESSIONS   1. Left ventricular ejection fraction, by estimation, is 60 to 65%. The left ventricle has normal function. The left ventricle has no regional wall motion abnormalities. There is moderate asymmetric left ventricular hypertrophy of the basal-septal segment. Left ventricular diastolic parameters are consistent with Grade II diastolic dysfunction (pseudonormalization). 2. Right ventricular systolic function is normal. The right ventricular size is normal. Tricuspid regurgitation signal is inadequate for assessing PA pressure. 3. Left atrial size was mildly dilated. 4. The mitral valve is degenerative. Trivial mitral valve regurgitation. No  evidence of mitral stenosis. Moderate mitral annular calcification. 5. The aortic valve is tricuspid. There is moderate calcification of the aortic valve. Aortic valve regurgitation is mild. Moderate aortic valve stenosis. Aortic valve mean gradient measures 23.0 mmHg, AVA 1.38 cm^2. 6. Aortic dilatation noted. There is mild dilatation of the ascending aorta, measuring 40 mm. 7. The inferior vena cava is dilated in size with >50% respiratory variability, suggesting right atrial pressure of 8 mmHg.  FINDINGS Left Ventricle: Left ventricular ejection fraction, by estimation, is 60 to 65%. The left ventricle has normal function. The left ventricle has no regional wall motion abnormalities. The left ventricular internal cavity size was normal in size. There is moderate asymmetric left ventricular hypertrophy of the basal-septal segment. Left ventricular diastolic parameters are consistent with Grade II diastolic dysfunction (pseudonormalization).  Right Ventricle: The right ventricular size is normal. No increase in right ventricular wall thickness. Right ventricular systolic function is normal. Tricuspid regurgitation signal is inadequate for assessing PA pressure.  Left Atrium: Left atrial size was mildly dilated.  Right Atrium: Right atrial size was normal in size.  Pericardium: There is no evidence of pericardial effusion.  Mitral Valve: The mitral valve is degenerative in appearance. There is mild calcification of the mitral  valve leaflet(s). Moderate mitral annular calcification. Trivial mitral valve regurgitation. No evidence of mitral valve stenosis.  Tricuspid Valve: The tricuspid valve is normal in structure. Tricuspid valve regurgitation is not demonstrated.  Aortic Valve: The aortic valve is tricuspid. There is moderate calcification of the aortic valve. Aortic valve regurgitation is mild. Moderate aortic stenosis is present. Aortic valve mean gradient measures 23.0 mmHg. Aortic valve  peak gradient measures 22.7 mmHg. Aortic valve area, by VTI measures 1.99 cm.  Pulmonic Valve: The pulmonic valve was normal in structure. Pulmonic valve regurgitation is not visualized.  Aorta: The aortic root is normal in size and structure and aortic dilatation noted. There is mild dilatation of the ascending aorta, measuring 40 mm.  Venous: The inferior vena cava is dilated in size with greater than 50% respiratory variability, suggesting right atrial pressure of 8 mmHg.  IAS/Shunts: No atrial level shunt detected by color flow Doppler.   LEFT VENTRICLE PLAX 2D LVIDd:         4.60 cm   Diastology LVIDs:         3.20 cm   LV e' lateral: 12.80 cm/s LV PW:         1.30 cm LV IVS:        1.50 cm LVOT diam:     2.00 cm LV SV:         108 LV SV Index:   53 LVOT Area:     3.14 cm   RIGHT VENTRICLE             IVC RV S prime:     17.80 cm/s  IVC diam: 2.40 cm TAPSE (M-mode): 3.1 cm  LEFT ATRIUM              Index        RIGHT ATRIUM           Index LA diam:        3.10 cm  1.51 cm/m   RA Area:     17.30 cm LA Vol (A2C):   114.0 ml 55.43 ml/m  RA Volume:   46.00 ml  22.37 ml/m LA Vol (A4C):   37.3 ml  18.14 ml/m LA Biplane Vol: 70.0 ml  34.03 ml/m AORTIC VALVE AV Area (Vmax):    1.73 cm AV Area (Vmean):   1.61 cm AV Area (VTI):     1.99 cm AV Vmax:           238.00 cm/s AV Vmean:          164.500 cm/s AV VTI:            0.542 m AV Peak Grad:      22.7 mmHg AV Mean Grad:      23.0 mmHg LVOT Vmax:         131.00 cm/s LVOT Vmean:        84.100 cm/s LVOT VTI:          0.344 m LVOT/AV VTI ratio: 0.64  AORTA Ao Root diam: 2.80 cm Ao Asc diam:  4.00 cm   SHUNTS Systemic VTI:  0.34 m Systemic Diam: 2.00 cm  Dalton McleanMD Electronically signed by Ezra Kanner Signature Date/Time: 07/03/2023/6:38:42 PM    Final      CT SCANS  CT CARDIAC SCORING (SELF PAY ONLY) 02/17/2022  Addendum 02/17/2022  3:16 PM ADDENDUM REPORT: 02/17/2022  15:14  EXAM: OVER-READ INTERPRETATION  CT CHEST  The following report is an over-read performed by radiologist Dr. Mabel Converse of  [facility] on 02/17/2022. This over-read does not include interpretation of cardiac or coronary anatomy or pathology. The coronary calcium  score interpretation by the cardiologist is attached.  COMPARISON:  None.  FINDINGS: Normal heart size. No pericardial effusion. Image thoracic aorta is nonaneurysmal. Aortic atherosclerosis.  No adenopathy within the included chest. Imaged lung fields are clear. Findings of diffuse idiopathic skeletal hyperostosis. No acute bony or chest wall abnormality.  IMPRESSION: 1. No acute or significant extracardiac findings. 2. Aortic atherosclerosis. 3. 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   Electronically Signed By: Mabel Converse D.O. On: 02/17/2022 15:14  Narrative CLINICAL DATA:  25F for cardiovascular disease risk stratification  EXAM: Coronary Calcium  Score  TECHNIQUE: A gated, non-contrast computed tomography scan of the heart was performed using 3mm slice thickness. Axial images were analyzed on a dedicated workstation. Calcium  scoring of the coronary arteries was performed using the Agatston method.  FINDINGS: Coronary arteries: Normal origins.  Coronary Calcium  Score:  Left main: 423  Left anterior descending artery: 174  Left circumflex artery: 158  Right coronary artery: 489  Total: 1245  Percentile: 95th  Pericardium: Normal.  Ascending Aorta: Normal caliber.  Aortic atherosclerosis.  Severe mitral annular calcification.  Non-cardiac: See separate report from [facility].  IMPRESSION: Coronary calcium  score of 1245. This was 95th percentile for age-, race-, and sex-matched controls.  Severe mitral annular calcification.  Aortic atherosclerosis.  Recommend aggressive risk factor management, aspirin  81mg  and LDL goal <70.  Consider cardiology consultation.  RECOMMENDATIONS: Coronary artery calcium  (CAC) score is a strong predictor of incident coronary heart disease (CHD) and provides predictive information beyond traditional risk factors. CAC scoring is reasonable to use in the decision to withhold, postpone, or initiate statin therapy in intermediate-risk or selected borderline-risk asymptomatic adults (age 85-75 years and LDL-C >=70 to <190 mg/dL) who do not have diabetes or established atherosclerotic cardiovascular disease (ASCVD).* In intermediate-risk (10-year ASCVD risk >=7.5% to <20%) adults or selected borderline-risk (10-year ASCVD risk >=5% to <7.5%) adults in whom a CAC score is measured for the purpose of making a treatment decision the following recommendations have been made:  If CAC=0, it is reasonable to withhold statin therapy and reassess in 5 to 10 years, as long as higher risk conditions are absent (diabetes mellitus, family history of premature CHD in first degree relatives (males <55 years; females <65 years), cigarette smoking, or LDL >=190 mg/dL).  If CAC is 1 to 99, it is reasonable to initiate statin therapy for patients >=45 years of age.  If CAC is >=100 or >=75th percentile, it is reasonable to initiate statin therapy at any age.  Cardiology referral should be considered for patients with CAC scores >=400 or >=75th percentile.  *2018 AHA/ACC/AACVPR/AAPA/ABC/ACPM/ADA/AGS/APhA/ASPC/NLA/PCNA Guideline on the Management of Blood Cholesterol: A Report of the American College of Cardiology/American Heart Association Task Force on Clinical Practice Guidelines. J Am Coll Cardiol. 2019;73(24):3168-3209.  Jasmin Scarce, MD  Electronically Signed: By: Jasmin [name] M.D. On: 02/17/2022 14:59     ______________________________________________________________________________________________      Risk Assessment/Calculations          Physical Exam VS:  BP (!)  160/60 (BP Location: Right Arm, Patient Position: Sitting, Cuff Size: Large) Comment: left arm 150/60  Pulse 89   Ht 5' 1 (1.549 m)   Wt 245 lb (111.1 kg)   SpO2 96%   BMI 46.29 kg/m   Vitals:   08/22/23 0814 08/22/23 0902  BP: (!) 160/60 Comment: left arm 150/60  136/68  Pulse: 89   Height: 5' 1 (1.549 m)   Weight: 245 lb (111.1 kg)   SpO2: 96%   BMI (Calculated): 46.32          Wt Readings from Last 3 Encounters:  08/22/23 245 lb (111.1 kg)  08/16/23 243 lb 12.8 oz (110.6 kg)  07/12/23 238 lb 12.8 oz (108.3 kg)    GEN: Well nourished, well developed in no acute distress NECK: No JVD; No carotid bruits CARDIAC: RRR, no murmurs, rubs, gallops RESPIRATORY:  Clear to auscultation without rales, wheezing or rhonchi  ABDOMEN: Soft, non-tender, non-distended EXTREMITIES:  No edema; No deformity   ASSESSMENT AND PLAN  Assessment & Plan S/p PPM / NSVT Six weeks post-implantation with no complications. Device functioning well with stable parameters by recent device check.  - Continue remote monitoring. - Gradually resume physical activities, starting at 50% of previous capacity for two weeks, then increase as tolerated. - Allow wearing of bras with wider straps; use a pad if irritation occurs.  HLD, LDL goal <70 Continue Simvastatin  20mg  daily  Aortic dilation / Aortic stenosis Echo 06/2023 aortic dilation at 40 mm with moderate AS. Continue optimal BP control. Repeat echo in 1 year. Not presently on beta blocker as had rash post PPM (unclear whether this was related to adhesive, metoprolol , or lasix ).   Hypertension Blood pressure generally around 130/80 mmHg by home readings. Initial reading in clinic160/60 which improved to 136/68 without intervention. Requires careful management due to aortic dilation. - Continue Losartan  75mg  daily as prescribed. - Monitor blood pressure at home, aiming for readings less than 130/80 mmHg. - Bring home blood pressure cuff to next  appointment with Dr. Inocencio for calibration. - If BP not at goal <130/80 by August visit with Dr. Inocencio, plan to increase Losartan  to 100mg  daily (alternatively, could transition to Olmesartan).  Allergic reaction to medications and adhesives Previous reactions to metoprolol , Lasix , and adhesives resolved after discontinuation. Cause unclear. - Avoid metoprolol  and Lasix  unless deemed necessary by a physician. - Monitor for any new allergic reactions.  DM 2  Continue to follow with PCP.   OSA CPAP compliance encouraged.         Dispo: follow up in 3-4 months with Dr. Raford   Signed, Reche GORMAN Finder, NP

## 2023-08-23 ENCOUNTER — Other Ambulatory Visit (HOSPITAL_BASED_OUTPATIENT_CLINIC_OR_DEPARTMENT_OTHER): Payer: Self-pay

## 2023-08-25 ENCOUNTER — Encounter (HOSPITAL_BASED_OUTPATIENT_CLINIC_OR_DEPARTMENT_OTHER): Payer: Self-pay | Admitting: Family

## 2023-09-05 ENCOUNTER — Other Ambulatory Visit: Payer: Self-pay

## 2023-09-06 ENCOUNTER — Encounter: Payer: Self-pay | Admitting: Family Medicine

## 2023-09-06 ENCOUNTER — Other Ambulatory Visit (HOSPITAL_BASED_OUTPATIENT_CLINIC_OR_DEPARTMENT_OTHER): Payer: Self-pay

## 2023-09-06 ENCOUNTER — Other Ambulatory Visit

## 2023-09-06 ENCOUNTER — Other Ambulatory Visit: Payer: Self-pay

## 2023-09-06 DIAGNOSIS — R300 Dysuria: Secondary | ICD-10-CM | POA: Diagnosis not present

## 2023-09-06 MED ORDER — CEPHALEXIN 500 MG PO CAPS
500.0000 mg | ORAL_CAPSULE | Freq: Two times a day (BID) | ORAL | 0 refills | Status: DC
  Filled 2023-09-06: qty 14, 7d supply, fill #0

## 2023-09-06 NOTE — Telephone Encounter (Signed)
 Copied from CRM [id number]. Topic: General - Other >> Sep 06, 2023 10:29 AM Rosina BIRCH wrote: Reason for CRM: patient called stating she is checking on a mychart message that she sent today CB [phone]

## 2023-09-06 NOTE — Telephone Encounter (Signed)
 Please see the MyChart message reply(ies) for my assessment and plan.  The patient gave consent for this Medical Advice Message and is aware that it may result in a bill to their insurance company as well as the possibility that this may result in a co-payment or deductible. They are an established patient, but are not seeking medical advice exclusively about a problem treated during an in person or video visit in the last 7 days. I did not recommend an in person or video visit within 7 days of my reply.  I spent a total of 15 minutes cumulative time within 7 days through [employer] Abbe Amsterdam, MD

## 2023-09-07 ENCOUNTER — Ambulatory Visit: Admitting: Cardiology

## 2023-09-08 LAB — URINE CULTURE
MICRO NUMBER:: 16706389
SPECIMEN QUALITY:: ADEQUATE

## 2023-09-08 NOTE — Telephone Encounter (Signed)
 Received urine culture- positive for proteus, pt is on keflex  which should be effective Message to pt

## 2023-09-12 ENCOUNTER — Other Ambulatory Visit (HOSPITAL_BASED_OUTPATIENT_CLINIC_OR_DEPARTMENT_OTHER): Payer: Self-pay

## 2023-09-12 MED ORDER — CYCLOSPORINE 0.05 % OP EMUL
1.0000 [drp] | Freq: Two times a day (BID) | OPHTHALMIC | 2 refills | Status: DC
  Filled 2023-09-12: qty 60, 30d supply, fill #0
  Filled 2023-10-10: qty 60, 30d supply, fill #1

## 2023-09-12 NOTE — Progress Notes (Unsigned)
 [facility] [address], [address] [place], [place] [address] [phone]  Date:  09/13/2023   Name:  Jasmin [name]   DOB:  [birth year]   MRN:  [id number]  PCP:  Watt Harlene BROCKS, MD    Chief Complaint: No chief complaint on file.   History of Present Illness:  Jasmin [name] is a 78 y.o. 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 Nathanael for her pulmonary hypertension and sleep apnea Her cardiologist is Dr MARLA- he is following due to coronary calcium  in 95th percentile  Her electrophysiologist is Dr. 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/2022   Obesity, Beginning BMI 46.86 03/15/2022   Elevated coronary artery calcium  score 02/24/2022   Left sided sciatica 07/05/2021   Skin cancer 11/04/2020   BMI 45.0-49.9, adult (HCC) 07/31/2020   Swelling of both lower extremities    Shortness of breath on exertion    Red eyes    Knee pain    Joint pain     Decreased hearing    Back pain    Nocturia 12/26/2018   Recurrent UTI 12/26/2018   Vaginal atrophy 12/26/2018   Hyperopia with astigmatism and presbyopia, bilateral 02/09/2018   Long term current use of oral hypoglycemic drug 02/09/2018   PCO (posterior capsular opacification), left 02/09/2018   Pseudophakia of left eye 02/09/2018   PVD (posterior vitreous detachment), both eyes 02/09/2018   Arthritis 12/16/2016   Right kidney stone    Hyperlipidemia    Heart murmur    Diabetes mellitus without complication (HCC)    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   Cystocele with rectocele 08/21/2016   Mixed hyperlipidemia 08/08/2016   Controlled type 2 diabetes mellitus without complication, without long-term current use of insulin  (HCC) 08/08/2016   Essential hypertension 08/08/2016   History of CVA (cerebrovascular accident) 08/08/2016   Nuclear sclerotic cataract of left eye 07/07/2016   OAB (overactive bladder) 01/20/2016   Class 3 severe obesity with serious comorbidity and body mass index (BMI) of 45.0 to 49.9 in adult 11/10/2015   IBS (irritable bowel syndrome) 07/16/2015   Insomnia 07/16/2015   Left ventricular hypertrophy 07/16/2015   Pulmonary hypertension (HCC) 07/16/2015   Primary osteoarthritis of left knee 03/31/2015   Acid reflux disease 03/09/2015   Aortic stenosis 03/09/2015   AR (allergic rhinitis) 03/09/2015   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   OSA on CPAP 03/09/2015   Osteoporosis 03/09/2015   Vitamin D  deficiency 03/09/2015   Left tibialis posterior  tendinitis 12/13/2013    Past Medical History:  Diagnosis Date   Acid reflux disease 03/09/2015   Acute on chronic heart failure (HCC) 07/03/2023   Aortic stenosis 03/09/2015   AR (allergic rhinitis) 03/09/2015   Arthritis    Back pain    Controlled type 2 diabetes mellitus without complication, without long-term current use of insulin  (HCC) 08/08/2016   Cough    Cystocele with rectocele 08/21/2016    Decreased hearing    Diabetes mellitus without complication (HCC)    Dry mouth    Easy bruising    Essential hypertension 08/08/2016   Heart murmur    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   History of CVA (cerebrovascular accident) 08/08/2016   Seen on MRI from 08/2015   Hyperlipidemia    Hypertension    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   IBS (irritable bowel syndrome) 07/16/2015   Insomnia 07/16/2015   Joint pain    Knee pain    Left ventricular hypertrophy 07/16/2015   Leg cramping    right leg   Mixed hyperlipidemia 08/08/2016   Morbid obesity (HCC) 11/10/2015   Nasal discharge    Nuclear sclerotic cataract of left eye 07/07/2016   OAB (overactive bladder) 01/20/2016   OSA (obstructive sleep apnea) 03/09/2015   Osteoporosis 03/09/2015   Primary osteoarthritis of left knee 03/31/2015   Pulmonary hypertension (HCC) 07/16/2015   Red eyes    Right kidney stone    Shortness of breath on exertion    Skin cancer 11/04/2020   Sleep apnea    Stroke (HCC)    Swelling of both lower extremities    UTI (urinary tract infection)    Vitamin D  deficiency 03/09/2015    Past Surgical History:  Procedure Laterality Date   ABDOMINAL HYSTERECTOMY  09/14/2016   CATARACT EXTRACTION Left    CATARACT EXTRACTION Left 06/2016   COLPORRHAPHY N/A 09/14/2016   [facility]   CYSTOCELE REPAIR     FRACTURE SURGERY Left 1953   L arm   FRACTURE SURGERY Left 1985   L ankle   HERNIA REPAIR  2004   JOINT REPLACEMENT Right 2012   R TKA   JOINT REPLACEMENT Left 2017   KIDNEY STONE SURGERY     LAPAROSCOPIC ASSISTED VAGINAL HYSTERECTOMY N/A 09/14/2016   [facility]   PACEMAKER IMPLANT N/A 07/07/2023   Procedure: PACEMAKER IMPLANT;  Surgeon: Inocencio Soyla Lunger, MD;  Location: [facility] INVASIVE CV LAB;  Service: Cardiovascular;  Laterality: N/A;   R foot/ankle Right 2013 and 2014   plate and screws lateral foot and tendon removal medial foot in 2013, revision in 2014   RECTOCELE REPAIR     RIGHT/LEFT HEART CATH AND  CORONARY ANGIOGRAPHY N/A 07/04/2023   Procedure: RIGHT/LEFT HEART CATH AND CORONARY ANGIOGRAPHY;  Surgeon: Anner Alm ORN, MD;  Location: [facility] INVASIVE CV LAB;  Service: Cardiovascular;  Laterality: N/A;   URETERAL STENT PLACEMENT     URETEROSCOPY      Social History   Tobacco Use   Smoking status: Never    Passive exposure: Never   Smokeless tobacco: Never  Vaping Use   Vaping status: Never Used  Substance Use Topics   Alcohol use: Yes    Alcohol/week: 2.0 - 3.0 standard drinks of alcohol    Types: 2 - 3 Glasses of wine per week    Comment: couple of glasses per week   Drug use: No    Family History  Problem Relation Age of Onset   Hyperlipidemia Mother  Heart disease Mother    Hypertension Mother    Stroke Mother    Thyroid  disease Mother    Hypertension Father    Heart disease Father     Allergies  Allergen Reactions   Latex Itching   Ciprofloxacin Hives   Lasix  [Furosemide ]     MAY have caused a rash- also consider lasix  allergy trigger.  Likely ok to try lasix  or a similar med if needed in the future    Metoprolol      MAY have caused a rash vs latex allergy during hospital admission. Likely reasonable to try a BB again if needed later    Metronidazole Hives   Tape Rash and Itching    Redness of skin    Medication list has been reviewed and updated.  Current Outpatient Medications on File Prior to Visit  Medication Sig Dispense Refill   Accu-Chek Softclix Lancets lancets USE AS DIRECTED TO CHECK BLOOD GLUCOSE ONCE DAILY 100 each 3   Acetaminophen  (TYLENOL  ARTHRITIS PAIN PO) Take 650 mg by mouth every 8 (eight) hours as needed (PAIN).     amoxicillin  (AMOXIL ) 500 MG tablet Take 500 mg by mouth 2 (two) times daily. 1 hr prior to dentist visit     Ascorbic Acid  (VITAMIN C  WITH ROSE HIPS) 500 MG tablet Take 500 mg by mouth daily.     aspirin  EC 81 MG tablet Take 1 tablet (81 mg total) by mouth daily. Swallow whole. 120 tablet 3   blood glucose meter kit and  supplies KIT Dispense based on patient and insurance preference. Use once daily to monitor glucose as needed (FOR ICD-9 250.00, 250.01). 1 each 0   CALCIUM  PO Take 1 tablet by mouth daily.     cephALEXin  (KEFLEX ) 500 MG capsule Take 1 capsule (500 mg total) by mouth 2 (two) times daily. 14 capsule 0   Cholecalciferol (VITAMIN D3) 2000 units TABS Take 1 tablet by mouth in the morning and at bedtime.     clopidogrel  (PLAVIX ) 75 MG tablet Take 1 tablet (75 mg total) by mouth daily. 90 tablet 0   clotrimazole -betamethasone  (LOTRISONE ) cream Apply to plantar foot daily 45 g 3   CRANBERRY EXTRACT PO Take 25,000 mg by mouth 2 (two) times daily.     cycloSPORINE  (RESTASIS ) 0.05 % ophthalmic emulsion Place 1 drop into both eyes 2 (two) times daily. 60 each 2   desmopressin  (DDAVP ) 0.2 MG tablet Take 1 tablet (0.2 mg total) by mouth at bedtime. 90 tablet 3   desonide  (DESOWEN ) 0.05 % cream Apply as directed to affected area. 15 g 0   econazole nitrate 1 % cream Apply 1 Application topically as needed (skin irritation).     estradiol  (ESTRACE ) 0.1 MG/GM vaginal cream Apply 0.5 grams vaginally nightly for 2 weeks, then apply 0.5 grams 2 times weekly 42.5 g 3   fluticasone  (FLONASE ) 50 MCG/ACT nasal spray Place 2 sprays into both nostrils daily. 48 g 3   glucose blood test strip Use as directed to check blood glucose daily. 100 each 12   losartan  (COZAAR ) 50 MG tablet Take 1 & 1/2 tablets (75 mg total) by mouth daily. 135 tablet 3   metFORMIN  (GLUCOPHAGE ) 500 MG tablet Take 1 tablet (500 mg total) by mouth 2 (two) times daily with a meal. 180 tablet 3   mirabegron  ER (MYRBETRIQ ) 50 MG TB24 tablet Take 1 tablet (50 mg total) by mouth daily. 90 tablet 3   omega-3 acid ethyl esters (LOVAZA ) 1 g capsule  Take 2 capsules (2 g total) by mouth 2 (two) times daily. 360 capsule 1   OXYCODONE  HCL PO Take 1 tablet by mouth as needed (pain).     pantoprazole  (PROTONIX ) 40 MG tablet Take 1 tablet (40 mg total) by mouth 2  (two) times daily. 180 tablet 3   Polyethyl Glycol-Propyl Glycol (SYSTANE OP) Apply 1 drop to eye daily as needed (for dry eyes).     PSYLLIUM PO Take 1 tablet by mouth daily.     simvastatin  (ZOCOR ) 20 MG tablet Take 1 tablet (20 mg total) by mouth at bedtime. 90 tablet 1   solifenacin  (VESICARE ) 10 MG tablet Take 1 tablet (10 mg total) by mouth daily. Swallow whole do not crush, chew, or split. 30 tablet 3   No current facility-administered medications on file prior to visit.    Review of Systems:  .aspoer   Physical Examination: There were no vitals filed for this visit. There were no vitals filed for this visit. There is no height or weight on file to calculate BMI. Ideal Body Weight:    GEN: no acute distress. HEENT: Atraumatic, Normocephalic.  Ears and Nose: No external deformity. CV: RRR, No M/G/R. No JVD. No thrill. No extra heart sounds. PULM: CTA B, no wheezes, crackles, rhonchi. No retractions. No resp. distress. No accessory muscle use. ABD: S, NT, ND, +BS. No rebound. No HSM. EXTR: No c/c/e PSYCH: Normally interactive. Conversant.    Assessment and Plan: ***  Signed Harlene Schroeder, MD

## 2023-09-13 ENCOUNTER — Ambulatory Visit: Admitting: Family Medicine

## 2023-09-13 ENCOUNTER — Other Ambulatory Visit (HOSPITAL_BASED_OUTPATIENT_CLINIC_OR_DEPARTMENT_OTHER): Payer: Self-pay

## 2023-09-13 ENCOUNTER — Encounter: Payer: Self-pay | Admitting: Family Medicine

## 2023-09-13 VITALS — BP 130/72 | HR 82 | Ht 61.0 in | Wt 243.6 lb

## 2023-09-13 DIAGNOSIS — E782 Mixed hyperlipidemia: Secondary | ICD-10-CM | POA: Diagnosis not present

## 2023-09-13 DIAGNOSIS — L821 Other seborrheic keratosis: Secondary | ICD-10-CM | POA: Diagnosis not present

## 2023-09-13 DIAGNOSIS — Z8673 Personal history of transient ischemic attack (TIA), and cerebral infarction without residual deficits: Secondary | ICD-10-CM

## 2023-09-13 DIAGNOSIS — N390 Urinary tract infection, site not specified: Secondary | ICD-10-CM | POA: Diagnosis not present

## 2023-09-13 MED ORDER — SIMVASTATIN 20 MG PO TABS
20.0000 mg | ORAL_TABLET | Freq: Every day | ORAL | 3 refills | Status: AC
  Filled 2023-09-13 – 2023-09-20 (×2): qty 90, 90d supply, fill #0
  Filled 2023-12-19: qty 90, 90d supply, fill #1
  Filled 2024-03-14: qty 90, 90d supply, fill #2

## 2023-09-13 MED ORDER — OMEGA-3-ACID ETHYL ESTERS 1 G PO CAPS
2.0000 g | ORAL_CAPSULE | Freq: Two times a day (BID) | ORAL | 3 refills | Status: AC
  Filled 2023-09-13 – 2023-11-10 (×2): qty 360, 90d supply, fill #0
  Filled 2024-02-07: qty 360, 90d supply, fill #1

## 2023-09-13 MED ORDER — CLOPIDOGREL BISULFATE 75 MG PO TABS
75.0000 mg | ORAL_TABLET | Freq: Every day | ORAL | 3 refills | Status: AC
  Filled 2023-09-13 – 2023-11-09 (×2): qty 90, 90d supply, fill #0
  Filled 2024-02-07: qty 90, 90d supply, fill #1

## 2023-09-13 NOTE — Patient Instructions (Addendum)
 Good to see you again today!  I will be in touch with your repeat urine culture We froze the skin lesion (called a seborrheic keratosis) on your back today This should allow it to dry and peel off over the next week or so. Please let me know if it continues to give you trouble!  If it does not come off or wants to bleed too much I can shave it off for you in a week or so

## 2023-09-14 ENCOUNTER — Encounter: Payer: Self-pay | Admitting: Family Medicine

## 2023-09-14 LAB — URINE CULTURE
MICRO NUMBER:: 16735484
Result:: NO GROWTH
SPECIMEN QUALITY:: ADEQUATE

## 2023-09-20 ENCOUNTER — Other Ambulatory Visit (HOSPITAL_BASED_OUTPATIENT_CLINIC_OR_DEPARTMENT_OTHER): Payer: Self-pay

## 2023-09-21 ENCOUNTER — Other Ambulatory Visit (HOSPITAL_BASED_OUTPATIENT_CLINIC_OR_DEPARTMENT_OTHER): Payer: Self-pay

## 2023-10-04 ENCOUNTER — Other Ambulatory Visit (HOSPITAL_BASED_OUTPATIENT_CLINIC_OR_DEPARTMENT_OTHER): Payer: Self-pay

## 2023-10-05 ENCOUNTER — Other Ambulatory Visit: Payer: Self-pay

## 2023-10-05 ENCOUNTER — Other Ambulatory Visit (HOSPITAL_BASED_OUTPATIENT_CLINIC_OR_DEPARTMENT_OTHER): Payer: Self-pay

## 2023-10-05 MED ORDER — SOLIFENACIN SUCCINATE 10 MG PO TABS
10.0000 mg | ORAL_TABLET | Freq: Every day | ORAL | 3 refills | Status: DC
  Filled 2023-10-05: qty 30, 30d supply, fill #0
  Filled 2023-10-31: qty 30, 30d supply, fill #1

## 2023-10-06 ENCOUNTER — Encounter: Admitting: Cardiology

## 2023-10-09 ENCOUNTER — Other Ambulatory Visit (HOSPITAL_BASED_OUTPATIENT_CLINIC_OR_DEPARTMENT_OTHER): Payer: Self-pay

## 2023-10-10 ENCOUNTER — Other Ambulatory Visit (HOSPITAL_BASED_OUTPATIENT_CLINIC_OR_DEPARTMENT_OTHER): Payer: Self-pay

## 2023-10-10 ENCOUNTER — Encounter: Payer: Self-pay | Admitting: Cardiology

## 2023-10-10 ENCOUNTER — Ambulatory Visit: Attending: Cardiology | Admitting: Cardiology

## 2023-10-10 VITALS — BP 130/68 | HR 89 | Ht 61.0 in | Wt 244.0 lb

## 2023-10-10 DIAGNOSIS — M25579 Pain in unspecified ankle and joints of unspecified foot: Secondary | ICD-10-CM

## 2023-10-10 DIAGNOSIS — R001 Bradycardia, unspecified: Secondary | ICD-10-CM | POA: Diagnosis not present

## 2023-10-10 DIAGNOSIS — I441 Atrioventricular block, second degree: Secondary | ICD-10-CM | POA: Diagnosis not present

## 2023-10-10 DIAGNOSIS — I442 Atrioventricular block, complete: Secondary | ICD-10-CM | POA: Diagnosis not present

## 2023-10-10 DIAGNOSIS — Z950 Presence of cardiac pacemaker: Secondary | ICD-10-CM

## 2023-10-10 NOTE — Progress Notes (Signed)
  Electrophysiology Office Note:   Date:  10/10/2023  ID:  Jasmin [name], DOB [birth year], MRN [id number]  Primary Cardiologist: Annabella Scarce, MD Primary Heart Failure: None Electrophysiologist: Abygayle Deltoro Gladis Norton, MD      History of Present Illness:   Jasmin [name] is a 78 y.o. female with h/o hypertension, hyperlipidemia, diabetes, CVA, sleep apnea, GERD, second-degree AV block seen today for routine electrophysiology follow-up s/p Pacemaker implant.  Since last being seen in our clinic the patient reports doing well.  She has no chest pain or shortness of breath.  Is able to do all of her daily activities.  She has no acute complaints.  she denies chest pain, palpitations, dyspnea, PND, orthopnea, nausea, vomiting, dizziness, syncope, edema, weight gain, or early satiety.    Review of systems complete and found to be negative unless listed in HPI.      EP Information / Studies Reviewed:    EKG is ordered today. Personal review as below.  EKG Interpretation Date/Time:  Tuesday October 10 2023 15:17:39 EDT Ventricular Rate:  89 PR Interval:  168 QRS Duration:  130 QT Interval:  392 QTC Calculation: 476 R Axis:   226  Text Interpretation: Normal sinus rhythm Indeterminate axis Right bundle branch block When compared with ECG of 08-Jul-2023 04:12, No significant change since last tracing Confirmed by Apollos Tenbrink ([id number]) on 10/10/2023 3:25:19 PM   PPM Interrogation-  reviewed in detail today,  See PACEART report.  Device History: Abbott Dual Chamber PPM implanted 07/07/2023 for Second Degree AV block  Risk Assessment/Calculations:            Physical Exam:   VS:  BP 130/68 (BP Location: Left Arm, Patient Position: Sitting, Cuff Size: Large)   Pulse 89   Ht 5' 1 (1.549 m)   Wt 244 lb (110.7 kg)   SpO2 96%   BMI 46.10 kg/m    Wt Readings from Last 3 Encounters:  10/10/23 244 lb (110.7 kg)  09/13/23 243 lb 9.6 oz (110.5 kg)  08/22/23 245 lb (111.1 kg)      GEN: Well nourished, well developed in no acute distress NECK: No JVD; No carotid bruits CARDIAC: Regular rate and rhythm, no murmurs, rubs, gallops RESPIRATORY:  Clear to auscultation without rales, wheezing or rhonchi  ABDOMEN: Soft, non-tender, non-distended EXTREMITIES:  No edema; No deformity   ASSESSMENT AND PLAN:    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  Coronary artery disease: Has elevated coronary calcium  score.  Plan per primary cardiology.  3.  Obstructive sleep apnea: CPAP compliance encouraged  Disposition:   Follow up with EP Team in 12 months  Signed, Wright Gravely Gladis Norton, MD

## 2023-10-11 ENCOUNTER — Encounter (HOSPITAL_BASED_OUTPATIENT_CLINIC_OR_DEPARTMENT_OTHER): Payer: Self-pay

## 2023-10-11 ENCOUNTER — Other Ambulatory Visit (HOSPITAL_BASED_OUTPATIENT_CLINIC_OR_DEPARTMENT_OTHER): Payer: Self-pay

## 2023-10-12 ENCOUNTER — Other Ambulatory Visit (HOSPITAL_BASED_OUTPATIENT_CLINIC_OR_DEPARTMENT_OTHER): Payer: Self-pay

## 2023-10-12 MED ORDER — DESMOPRESSIN ACETATE 0.2 MG PO TABS
0.2000 mg | ORAL_TABLET | Freq: Every day | ORAL | 3 refills | Status: DC
  Filled 2023-10-12: qty 90, 90d supply, fill #0

## 2023-10-19 ENCOUNTER — Other Ambulatory Visit (HOSPITAL_BASED_OUTPATIENT_CLINIC_OR_DEPARTMENT_OTHER): Payer: Self-pay

## 2023-10-19 DIAGNOSIS — G4733 Obstructive sleep apnea (adult) (pediatric): Secondary | ICD-10-CM | POA: Diagnosis not present

## 2023-10-19 DIAGNOSIS — R351 Nocturia: Secondary | ICD-10-CM | POA: Diagnosis not present

## 2023-10-19 DIAGNOSIS — N3281 Overactive bladder: Secondary | ICD-10-CM | POA: Diagnosis not present

## 2023-10-19 DIAGNOSIS — Z789 Other specified health status: Secondary | ICD-10-CM | POA: Diagnosis not present

## 2023-10-19 DIAGNOSIS — R8290 Unspecified abnormal findings in urine: Secondary | ICD-10-CM | POA: Diagnosis not present

## 2023-10-19 MED ORDER — MIRABEGRON ER 50 MG PO TB24
50.0000 mg | ORAL_TABLET | Freq: Every day | ORAL | 4 refills | Status: AC
  Filled 2024-02-19: qty 90, 90d supply, fill #0

## 2023-10-19 MED ORDER — DESMOPRESSIN ACETATE 0.2 MG PO TABS
0.2000 mg | ORAL_TABLET | Freq: Every day | ORAL | 4 refills | Status: AC
  Filled 2023-10-19 – 2023-12-30 (×2): qty 90, 90d supply, fill #0

## 2023-10-19 MED ORDER — SOLIFENACIN SUCCINATE 10 MG PO TABS
10.0000 mg | ORAL_TABLET | Freq: Every day | ORAL | 4 refills | Status: AC
  Filled 2023-10-31: qty 90, 90d supply, fill #0
  Filled 2024-01-28: qty 90, 90d supply, fill #1

## 2023-10-24 ENCOUNTER — Other Ambulatory Visit (HOSPITAL_BASED_OUTPATIENT_CLINIC_OR_DEPARTMENT_OTHER): Payer: Self-pay

## 2023-10-24 MED ORDER — NITROFURANTOIN MONOHYD MACRO 100 MG PO CAPS
100.0000 mg | ORAL_CAPSULE | Freq: Two times a day (BID) | ORAL | 0 refills | Status: DC
  Filled 2023-10-24: qty 10, 5d supply, fill #0

## 2023-10-25 ENCOUNTER — Ambulatory Visit (INDEPENDENT_AMBULATORY_CARE_PROVIDER_SITE_OTHER)

## 2023-10-25 DIAGNOSIS — Z23 Encounter for immunization: Secondary | ICD-10-CM | POA: Diagnosis not present

## 2023-10-30 ENCOUNTER — Other Ambulatory Visit (HOSPITAL_BASED_OUTPATIENT_CLINIC_OR_DEPARTMENT_OTHER): Payer: Self-pay

## 2023-10-30 DIAGNOSIS — H16223 Keratoconjunctivitis sicca, not specified as Sjogren's, bilateral: Secondary | ICD-10-CM | POA: Diagnosis not present

## 2023-10-30 DIAGNOSIS — H52223 Regular astigmatism, bilateral: Secondary | ICD-10-CM | POA: Diagnosis not present

## 2023-10-30 DIAGNOSIS — H26491 Other secondary cataract, right eye: Secondary | ICD-10-CM | POA: Diagnosis not present

## 2023-10-30 MED ORDER — CYCLOSPORINE 0.05 % OP EMUL
1.0000 [drp] | Freq: Two times a day (BID) | OPHTHALMIC | 3 refills | Status: AC
  Filled 2023-10-30: qty 180, 90d supply, fill #0
  Filled 2024-02-05: qty 180, 90d supply, fill #1

## 2023-10-31 ENCOUNTER — Other Ambulatory Visit (HOSPITAL_BASED_OUTPATIENT_CLINIC_OR_DEPARTMENT_OTHER): Payer: Self-pay

## 2023-10-31 ENCOUNTER — Other Ambulatory Visit: Payer: Self-pay

## 2023-11-03 ENCOUNTER — Encounter (INDEPENDENT_AMBULATORY_CARE_PROVIDER_SITE_OTHER): Payer: Self-pay | Admitting: Family Medicine

## 2023-11-03 ENCOUNTER — Other Ambulatory Visit (HOSPITAL_BASED_OUTPATIENT_CLINIC_OR_DEPARTMENT_OTHER): Payer: Self-pay

## 2023-11-03 ENCOUNTER — Other Ambulatory Visit (INDEPENDENT_AMBULATORY_CARE_PROVIDER_SITE_OTHER)

## 2023-11-03 DIAGNOSIS — R300 Dysuria: Secondary | ICD-10-CM

## 2023-11-03 LAB — POCT URINALYSIS DIPSTICK
Bilirubin, UA: NEGATIVE
Blood, UA: NEGATIVE
Glucose, UA: NEGATIVE
Ketones, UA: NEGATIVE
Nitrite, UA: NEGATIVE
Protein, UA: POSITIVE — AB
Spec Grav, UA: 1.01 (ref 1.010–1.025)
Urobilinogen, UA: 0.2 U/dL
pH, UA: 7.5 (ref 5.0–8.0)

## 2023-11-03 MED ORDER — NITROFURANTOIN MONOHYD MACRO 100 MG PO CAPS
100.0000 mg | ORAL_CAPSULE | Freq: Two times a day (BID) | ORAL | 0 refills | Status: DC
  Filled 2023-11-03: qty 14, 7d supply, fill #0

## 2023-11-03 NOTE — Addendum Note (Signed)
 Addended by: Freedom Peddy M on: 11/03/2023 01:52 PM   Modules accepted: Orders

## 2023-11-03 NOTE — Addendum Note (Signed)
 Addended by: WATT RAISIN C on: 11/03/2023 01:09 PM   Modules accepted: Orders

## 2023-11-03 NOTE — Telephone Encounter (Signed)

## 2023-11-06 LAB — URINE CULTURE
MICRO NUMBER:: 16960831
SPECIMEN QUALITY:: ADEQUATE

## 2023-11-06 MED ORDER — AMOXICILLIN-POT CLAVULANATE 500-125 MG PO TABS
1.0000 | ORAL_TABLET | Freq: Two times a day (BID) | ORAL | 0 refills | Status: DC
  Filled 2023-11-06: qty 10, 5d supply, fill #0

## 2023-11-06 NOTE — Addendum Note (Signed)
 Addended by: WATT HARLENE BROCKS on: 11/06/2023 08:38 PM   Modules accepted: Orders

## 2023-11-07 ENCOUNTER — Other Ambulatory Visit (HOSPITAL_BASED_OUTPATIENT_CLINIC_OR_DEPARTMENT_OTHER): Payer: Self-pay

## 2023-11-09 ENCOUNTER — Other Ambulatory Visit (HOSPITAL_BASED_OUTPATIENT_CLINIC_OR_DEPARTMENT_OTHER): Payer: Self-pay

## 2023-11-09 ENCOUNTER — Other Ambulatory Visit: Payer: Self-pay

## 2023-11-09 MED ORDER — AMOXICILLIN 500 MG PO TABS
2000.0000 mg | ORAL_TABLET | Freq: Every day | ORAL | 1 refills | Status: AC
  Filled 2023-11-09: qty 12, 3d supply, fill #0
  Filled 2023-11-15: qty 12, 3d supply, fill #1

## 2023-11-10 ENCOUNTER — Other Ambulatory Visit (HOSPITAL_BASED_OUTPATIENT_CLINIC_OR_DEPARTMENT_OTHER): Payer: Self-pay

## 2023-11-14 NOTE — Progress Notes (Addendum)
 [facility] [address] [place], [place] [address] [phone]  Date:  11/15/2023   Name:  Jasmin [name]   DOB:  12-05-1945   MRN:  [id number]  PCP:  Watt Harlene BROCKS, MD    Chief Complaint: Follow-up (Refill desowen , I dont have an appointment with dermatology for a few months /Pt would like to discuss the COVID vaccine )   History of Present Illness:  Jasmin [name] is a 78 y.o. very pleasant female patient who presents with the following:  Patient seen today for periodic follow-up.   History of well-controlled diabetes, hypertension, hyperlipidemia, stroke, OSA, obesity, pulmonary hypertension, aortic stenosis, heart block status post pacemaker She is followed by pulmonology, Dr. Nathanael for her pulmonary hypertension and sleep apnea Her cardiologist is Dr MARLA- he is following due to coronary calcium  in 95th percentile  Her electrophysiologist is Dr. Inocencio  I saw her most recently in July when she was dealing with symptoms of a urinary tract infection.  Urine culture was negative at that time.  However, she then contacted us  earlier this month and had symptoms again-this time her urine did grow Proteus.   She is seeing Dr. Dorothyann Ku, urogynecology with [facility].  Most recent visit was in August: Assessment:Refractory OAB- she is optimized on medications and continues to note bothersome frequency.  Plan:Overactive bladder syndrome: We discussed the symptoms of overactive bladder (OAB), which include urinary urgency, urinary frequency, night-time urination, with or without urge incontinence. Risk factors for OAB include metabolic syndrome, obesity, pharmacetuicals (ex. Diuretics). While we do not know the exact etiology of OAB, several treatment options exist. We discussed management including behavioral therapy (decreasing bladder irritants by following a bladder diet, urge suppression strategies, timed voids,  bladder retraining), physical therapy, medication; and for refractory cases posterior tibial nerve stimulation, sacral neuromodulation, and intravesical botulinum toxin injection.  Continue Medication options: she is on vesicare , mirabegron , DDAVP  and these are all refilled for a year  She saw Dr. Inocencio in August.  He noted pacemaker working well, plan for follow-up in 1 year.  Encouraged her to use her CPAP machine  -Recommend COVID booster this fall -Can update urine micro -update A1c -Flu shot is already done -She has completed RSV and Shingrix -Her mammogram is up-to-date -Most recent colonoscopy was in 2017, I do not have the complete report but it looks like some polyps were removed.  She may be due for follow-up??    Plavix  DDAVP , Myrbetriq , Vesicare  Vaginal estrogen Losartan  75 Metformin  500 twice daily Simvastatin  Discussed the use of AI scribe software for clinical note transcription with the patient, who gave verbal consent to proceed.  History of Present Illness Jasmin [name] is a 78 year old female who presents for follow-up and medication management for overactive bladder and recent urinary tract infections.  She has been experiencing symptoms of overactive bladder and is currently on medications, including estrogen, to prevent infections. She has a history of multiple surgeries, including on her knees and hip, which makes her hesitant to pursue further invasive treatments.  She recently had a urinary tract infection, initially feeling 'a little funny' before leaving a urine specimen during an appointment. She was informed of the infection several days later, by which time her symptoms had worsened. The initial medication prescribed was ineffective. She does not have sterile urine cups at home, which has been a challenge in timely diagnosis and treatment.  She has had recent  changes in her medication regimen for her heart condition, discontinuing aspirin  and continuing  with Plavix , which she now takes in the morning. She has a history of seeing multiple providers for her heart-related issues.  She had a colonoscopy in 2017 where polyps were removed. She is uncertain about the follow-up schedule but recalls the procedure was done in [facility]. She is considering another screening due to her previous findings and is exploring options for where to have it done. She would like to use the same doc her son uses but cannot recall who this was   She is actively working on her physical health by exercising at [employer], focusing on walking to strengthen her knees. She is attempting to manage her weight by setting up a fruit snack bar to avoid unhealthy snacking. She has difficulty losing weight despite her efforts.   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/2022   Obesity, Beginning BMI 46.86 03/15/2022   Elevated coronary artery calcium  score 02/24/2022   Left sided sciatica 07/05/2021   Skin cancer 11/04/2020   BMI 45.0-49.9, adult (HCC) 07/31/2020   Swelling of both lower extremities    Shortness of breath on exertion    Red eyes    Knee pain    Joint pain    Decreased hearing    Back pain    Nocturia 12/26/2018   Recurrent UTI 12/26/2018   Vaginal atrophy 12/26/2018   Hyperopia with astigmatism and presbyopia, bilateral 02/09/2018   Long term current use of oral hypoglycemic drug 02/09/2018   PCO (posterior capsular opacification), left 02/09/2018   Pseudophakia of left eye 02/09/2018   PVD  (posterior vitreous detachment), both eyes 02/09/2018   Arthritis 12/16/2016   Right kidney stone    Hyperlipidemia    Heart murmur    Diabetes mellitus without complication (HCC)    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   Cystocele with rectocele 08/21/2016   Mixed hyperlipidemia 08/08/2016   Controlled type 2 diabetes mellitus without complication, without long-term current use of insulin  (HCC) 08/08/2016   Essential hypertension 08/08/2016   History of CVA (cerebrovascular accident) 08/08/2016   Nuclear sclerotic cataract of left eye 07/07/2016   OAB (overactive bladder) 01/20/2016   Class 3 severe obesity with serious comorbidity and body mass index (BMI) of 45.0 to 49.9 in adult 11/10/2015   IBS (irritable bowel syndrome) 07/16/2015   Insomnia 07/16/2015   Left ventricular hypertrophy 07/16/2015   Pulmonary hypertension (HCC) 07/16/2015   Primary osteoarthritis of left knee 03/31/2015   Acid reflux disease 03/09/2015   Aortic stenosis 03/09/2015   AR (allergic rhinitis) 03/09/2015   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   OSA on CPAP 03/09/2015   Osteoporosis 03/09/2015   Vitamin D  deficiency 03/09/2015   Left tibialis posterior tendinitis 12/13/2013    Past Medical History:  Diagnosis Date   Acid reflux disease 03/09/2015   Acute on chronic heart failure (HCC) 07/03/2023   Aortic stenosis 03/09/2015   AR (allergic rhinitis) 03/09/2015   Arthritis    Back pain  Controlled type 2 diabetes mellitus without complication, without long-term current use of insulin  (HCC) 08/08/2016   Cough    Cystocele with rectocele 08/21/2016   Decreased hearing    Diabetes mellitus without complication (HCC)    Dry mouth    Easy bruising    Essential hypertension 08/08/2016   Heart murmur    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   History of CVA (cerebrovascular accident) 08/08/2016   Seen on MRI from 08/2015   Hyperlipidemia    Hypertension    Hypertonicity of bladder  03/09/2015   IBS (irritable bowel syndrome) 07/16/2015   Insomnia 07/16/2015   Joint pain    Knee pain    Left ventricular hypertrophy 07/16/2015   Leg cramping    right leg   Mixed hyperlipidemia 08/08/2016   Morbid obesity (HCC) 11/10/2015   Nasal discharge    Nuclear sclerotic cataract of left eye 07/07/2016   OAB (overactive bladder) 01/20/2016   OSA (obstructive sleep apnea) 03/09/2015   Osteoporosis 03/09/2015   Primary osteoarthritis of left knee 03/31/2015   Pulmonary hypertension (HCC) 07/16/2015   Red eyes    Right kidney stone    Shortness of breath on exertion    Skin cancer 11/04/2020   Sleep apnea    Stroke (HCC)    Swelling of both lower extremities    UTI (urinary tract infection)    Vitamin D  deficiency 03/09/2015    Past Surgical History:  Procedure Laterality Date   ABDOMINAL HYSTERECTOMY  09/14/2016   CATARACT EXTRACTION Left    CATARACT EXTRACTION Left 06/2016   COLPORRHAPHY N/A 09/14/2016   [facility]   CYSTOCELE REPAIR     FRACTURE SURGERY Left 1953   L arm   FRACTURE SURGERY Left 1985   L ankle   HERNIA REPAIR  2004   JOINT REPLACEMENT Right 2012   R TKA   JOINT REPLACEMENT Left 2017   KIDNEY STONE SURGERY     LAPAROSCOPIC ASSISTED VAGINAL HYSTERECTOMY N/A 09/14/2016   [facility]   PACEMAKER IMPLANT N/A 07/07/2023   Procedure: PACEMAKER IMPLANT;  Surgeon: Inocencio Soyla Lunger, MD;  Location: [facility] INVASIVE CV LAB;  Service: Cardiovascular;  Laterality: N/A;   R foot/ankle Right 2013 and 2014   plate and screws lateral foot and tendon removal medial foot in 2013, revision in 2014   RECTOCELE REPAIR     RIGHT/LEFT HEART CATH AND CORONARY ANGIOGRAPHY N/A 07/04/2023   Procedure: RIGHT/LEFT HEART CATH AND CORONARY ANGIOGRAPHY;  Surgeon: Anner Alm ORN, MD;  Location: [facility] INVASIVE CV LAB;  Service: Cardiovascular;  Laterality: N/A;   URETERAL STENT PLACEMENT     URETEROSCOPY      Social History   Tobacco Use   Smoking status: Never    Passive exposure: Never    Smokeless tobacco: Never  Vaping Use   Vaping status: Never Used  Substance Use Topics   Alcohol use: Yes    Alcohol/week: 2.0 - 3.0 standard drinks of alcohol    Types: 2 - 3 Glasses of wine per week    Comment: couple of glasses per week   Drug use: No    Family History  Problem Relation Age of Onset   Hyperlipidemia Mother    Heart disease Mother    Hypertension Mother    Stroke Mother    Thyroid  disease Mother    Hypertension Father    Heart disease Father     Allergies  Allergen Reactions   Latex Itching   Ciprofloxacin Hives  Lasix  [Furosemide ]     MAY have caused a rash- also consider lasix  allergy trigger.  Likely ok to try lasix  or a similar med if needed in the future    Metoprolol      MAY have caused a rash vs latex allergy during hospital admission. Likely reasonable to try a BB again if needed later    Metronidazole Hives   Tape Rash and Itching    Redness of skin    Medication list has been reviewed and updated.  Current Outpatient Medications on File Prior to Visit  Medication Sig Dispense Refill   Accu-Chek Softclix Lancets lancets USE AS DIRECTED TO CHECK BLOOD GLUCOSE ONCE DAILY 100 each 3   Acetaminophen  (TYLENOL  ARTHRITIS PAIN PO) Take 650 mg by mouth every 8 (eight) hours as needed (PAIN).     amoxicillin  (AMOXIL ) 500 MG tablet Take 4 tablets (2,000 mg total) by mouth 1 hour prior to dental appointment. 12 tablet 1   Ascorbic Acid  (VITAMIN C  WITH ROSE HIPS) 500 MG tablet Take 500 mg by mouth daily.     blood glucose meter kit and supplies KIT Dispense based on patient and insurance preference. Use once daily to monitor glucose as needed (FOR ICD-9 250.00, 250.01). 1 each 0   CALCIUM  PO Take 1 tablet by mouth daily.     Cholecalciferol (VITAMIN D3) 2000 units TABS Take 1 tablet by mouth in the morning and at bedtime.     clopidogrel  (PLAVIX ) 75 MG tablet Take 1 tablet (75 mg total) by mouth daily. 90 tablet 3   clotrimazole -betamethasone  (LOTRISONE )  cream Apply to plantar foot daily 45 g 3   CRANBERRY EXTRACT PO Take 25,000 mg by mouth 2 (two) times daily.     cycloSPORINE  (RESTASIS ) 0.05 % ophthalmic emulsion Place 1 drop into both eyes 2 (two) times daily. 60 each 2   cycloSPORINE  (RESTASIS ) 0.05 % ophthalmic emulsion Place 1 drop into both eyes 2 (two) times daily. 180 each 3   desmopressin  (DDAVP ) 0.2 MG tablet Take 1 tablet (0.2 mg total) by mouth at bedtime. 90 tablet 3   desmopressin  (DDAVP ) 0.2 MG tablet Take 1 tablet (0.2 mg total) by mouth at bedtime. 90 tablet 4   econazole nitrate 1 % cream Apply 1 Application topically as needed (skin irritation).     estradiol  (ESTRACE ) 0.1 MG/GM vaginal cream Apply 0.5 grams vaginally nightly for 2 weeks, then apply 0.5 grams 2 times weekly 42.5 g 3   fluticasone  (FLONASE ) 50 MCG/ACT nasal spray Place 2 sprays into both nostrils daily. 48 g 3   glucose blood test strip Use as directed to check blood glucose daily. 100 each 12   losartan  (COZAAR ) 50 MG tablet Take 1 & 1/2 tablets (75 mg total) by mouth daily. 135 tablet 3   metFORMIN  (GLUCOPHAGE ) 500 MG tablet Take 1 tablet (500 mg total) by mouth 2 (two) times daily with a meal. 180 tablet 3   mirabegron  ER (MYRBETRIQ ) 50 MG TB24 tablet Take 1 tablet (50 mg total) by mouth daily. 90 tablet 3   mirabegron  ER (MYRBETRIQ ) 50 MG TB24 tablet Take 1 tablet (50 mg total) by mouth daily. 90 tablet 4   omega-3 acid ethyl esters (LOVAZA ) 1 g capsule Take 2 capsules (2 g total) by mouth 2 (two) times daily. 360 capsule 3   OXYCODONE  HCL PO Take 1 tablet by mouth as needed (pain).     pantoprazole  (PROTONIX ) 40 MG tablet Take 1 tablet (40 mg total) by mouth  2 (two) times daily. 180 tablet 3   Polyethyl Glycol-Propyl Glycol (SYSTANE OP) Apply 1 drop to eye daily as needed (for dry eyes).     PSYLLIUM PO Take 1 tablet by mouth daily.     simvastatin  (ZOCOR ) 20 MG tablet Take 1 tablet (20 mg total) by mouth at bedtime. 90 tablet 3   solifenacin  (VESICARE ) 10  MG tablet Take 1 tablet (10 mg total) by mouth daily. Swallow tablet whole; do not crush, chew, or split. 90 tablet 4   aspirin  EC 81 MG tablet Take 1 tablet (81 mg total) by mouth daily. Swallow whole. (Patient not taking: Reported on 11/15/2023) 120 tablet 3   No current facility-administered medications on file prior to visit.    Review of Systems:  As per HPI- otherwise negative.   Physical Examination: Vitals:   11/15/23 0819  BP: 130/76  Pulse: 76  SpO2: 97%   Vitals:   11/15/23 0819  Weight: 242 lb 6.4 oz (110 kg)  Height: 5' 1 (1.549 m)   Body mass index is 45.8 kg/m. Ideal Body Weight: Weight in (lb) to have BMI = 25: 132  GEN: no acute distress.  Obese, looks well  HEENT: Atraumatic, Normocephalic.  Ears and Nose: No external deformity. CV: RRR, No M/G/R. No JVD. No thrill. No extra heart sounds. PULM: CTA B, no wheezes, crackles, rhonchi. No retractions. No resp. distress. No accessory muscle use. EXTR: No c/c/e PSYCH: Normally interactive. Conversant.    Assessment and Plan: Controlled type 2 diabetes mellitus without complication, without long-term current use of insulin  (HCC) - Plan: Basic metabolic panel with GFR, Hemoglobin A1c, Microalbumin / creatinine urine ratio  Recurrent UTI  Mixed hyperlipidemia  CAD in native artery  Cardiac pacemaker in situ  Heat rash - Plan: desonide  (DESOWEN ) 0.05 % cream  Assessment & Plan Overactive bladder with recurrent urinary tract infections Managing symptoms with medications and estrogen. Avoiding advanced treatments due to surgical history. Recent UTI not promptly treated. - Provide sterile urine cups for home use. - Encourage bringing urine samples for culture before antibiotics.  Atherosclerotic heart disease of native coronary artery, status post cardiac pacemaker Advised to discontinue aspirin , continue Plavix  per cardiology   Type 2 diabetes mellitus without complications Diabetes monitored. - Order  basic metabolic profile and blood sugar tests.  Mixed hyperlipidemia Managed with current medications.  Colon cancer screening Last colonoscopy in 2017 with polyps removed. Considering another screening. - Arrange colonoscopy screening at [facility]. She will let me know who she wants to see   General Health Maintenance Up to date with flu shot. Advised COVID booster. Regular exercise for health improvement. - Recommend COVID booster, available downstairs. - Encourage continued exercise and healthy eating.  Signed Harlene Schroeder, MD  Received labs, message to pt   Results for orders placed or performed in visit on 11/15/23  Basic metabolic panel with GFR   Collection Time: 11/15/23  9:03 AM  Result Value Ref Range   Sodium 136 135 - 145 mEq/L   Potassium 4.6 3.5 - 5.1 mEq/L   Chloride 98 96 - 112 mEq/L   CO2 29 19 - 32 mEq/L   Glucose, Bld 135 (H) 70 - 99 mg/dL   BUN 15 6 - 23 mg/dL   Creatinine, Ser 9.42 0.40 - 1.20 mg/dL   GFR 13.12 >39.99 mL/min   Calcium  9.5 8.4 - 10.5 mg/dL  Hemoglobin J8r   Collection Time: 11/15/23  9:03 AM  Result Value Ref Range  Hgb A1c MFr Bld 7.6 (H) 4.6 - 6.5 %

## 2023-11-14 NOTE — Patient Instructions (Incomplete)
 It was great to see you today, I will be in touch with your labs Recommend COVID booster this fall if not already done Assuming all is well please see me in about 6 months ?  Are you due for follow-up colon cancer screening- it sounds like yes!  Please message me with who you would like to see

## 2023-11-15 ENCOUNTER — Other Ambulatory Visit (HOSPITAL_BASED_OUTPATIENT_CLINIC_OR_DEPARTMENT_OTHER): Payer: Self-pay

## 2023-11-15 ENCOUNTER — Encounter: Payer: Self-pay | Admitting: Family Medicine

## 2023-11-15 ENCOUNTER — Ambulatory Visit: Admitting: Family Medicine

## 2023-11-15 VITALS — BP 130/76 | HR 76 | Ht 61.0 in | Wt 242.4 lb

## 2023-11-15 DIAGNOSIS — N390 Urinary tract infection, site not specified: Secondary | ICD-10-CM | POA: Diagnosis not present

## 2023-11-15 DIAGNOSIS — L740 Miliaria rubra: Secondary | ICD-10-CM | POA: Diagnosis not present

## 2023-11-15 DIAGNOSIS — Z1211 Encounter for screening for malignant neoplasm of colon: Secondary | ICD-10-CM

## 2023-11-15 DIAGNOSIS — I2510 Atherosclerotic heart disease of native coronary artery without angina pectoris: Secondary | ICD-10-CM

## 2023-11-15 DIAGNOSIS — Z950 Presence of cardiac pacemaker: Secondary | ICD-10-CM

## 2023-11-15 DIAGNOSIS — E782 Mixed hyperlipidemia: Secondary | ICD-10-CM | POA: Diagnosis not present

## 2023-11-15 DIAGNOSIS — E119 Type 2 diabetes mellitus without complications: Secondary | ICD-10-CM

## 2023-11-15 LAB — BASIC METABOLIC PANEL WITH GFR
BUN: 15 mg/dL (ref 6–23)
CO2: 29 meq/L (ref 19–32)
Calcium: 9.5 mg/dL (ref 8.4–10.5)
Chloride: 98 meq/L (ref 96–112)
Creatinine, Ser: 0.57 mg/dL (ref 0.40–1.20)
GFR: 86.87 mL/min (ref >60.00)
Glucose, Bld: 135 mg/dL — ABNORMAL HIGH (ref 70–99)
Potassium: 4.6 meq/L (ref 3.5–5.1)
Sodium: 136 meq/L (ref 135–145)

## 2023-11-15 LAB — HEMOGLOBIN A1C: Hgb A1c MFr Bld: 7.6 % — ABNORMAL HIGH (ref 4.6–6.5)

## 2023-11-15 MED ORDER — DESONIDE 0.05 % EX CREA
TOPICAL_CREAM | CUTANEOUS | 1 refills | Status: DC
  Filled 2023-11-15: qty 60, 30d supply, fill #0
  Filled 2023-12-07: qty 60, 30d supply, fill #1

## 2023-11-15 MED ORDER — COMIRNATY 30 MCG/0.3ML IM SUSY
0.3000 mL | PREFILLED_SYRINGE | Freq: Once | INTRAMUSCULAR | 0 refills | Status: DC
Start: 2023-11-15 — End: 2023-11-16
  Filled 2023-11-15: qty 0.3, 1d supply, fill #0

## 2023-11-16 ENCOUNTER — Ambulatory Visit: Payer: Self-pay | Admitting: Family Medicine

## 2023-11-16 ENCOUNTER — Other Ambulatory Visit (INDEPENDENT_AMBULATORY_CARE_PROVIDER_SITE_OTHER)

## 2023-11-16 ENCOUNTER — Other Ambulatory Visit (HOSPITAL_BASED_OUTPATIENT_CLINIC_OR_DEPARTMENT_OTHER): Payer: Self-pay

## 2023-11-16 ENCOUNTER — Other Ambulatory Visit: Payer: Self-pay

## 2023-11-16 DIAGNOSIS — R300 Dysuria: Secondary | ICD-10-CM | POA: Diagnosis not present

## 2023-11-16 DIAGNOSIS — E119 Type 2 diabetes mellitus without complications: Secondary | ICD-10-CM

## 2023-11-16 DIAGNOSIS — N390 Urinary tract infection, site not specified: Secondary | ICD-10-CM

## 2023-11-16 LAB — POCT URINALYSIS DIP (MANUAL ENTRY)
Blood, UA: NEGATIVE
Glucose, UA: NEGATIVE mg/dL
Nitrite, UA: POSITIVE — AB
Protein Ur, POC: NEGATIVE mg/dL
Spec Grav, UA: 1.015 (ref 1.010–1.025)
Urobilinogen, UA: 0.2 U/dL
pH, UA: 6 (ref 5.0–8.0)

## 2023-11-16 LAB — MICROALBUMIN / CREATININE URINE RATIO
Creatinine,U: 77 mg/dL
Microalb Creat Ratio: 19.2 mg/g (ref 0.0–30.0)
Microalb, Ur: 1.5 mg/dL (ref 0.0–1.9)

## 2023-11-16 MED ORDER — AMOXICILLIN-POT CLAVULANATE 500-125 MG PO TABS
1.0000 | ORAL_TABLET | Freq: Three times a day (TID) | ORAL | 0 refills | Status: DC
  Filled 2023-11-16: qty 10, 4d supply, fill #0

## 2023-11-16 NOTE — Addendum Note (Signed)
 Addended by: WATT RAISIN C on: 11/16/2023 12:11 PM   Modules accepted: Orders

## 2023-11-19 LAB — URINE CULTURE
MICRO NUMBER:: 17016789
SPECIMEN QUALITY:: ADEQUATE

## 2023-11-20 ENCOUNTER — Other Ambulatory Visit: Payer: Self-pay

## 2023-11-20 ENCOUNTER — Telehealth: Payer: Self-pay

## 2023-11-20 ENCOUNTER — Other Ambulatory Visit (HOSPITAL_BASED_OUTPATIENT_CLINIC_OR_DEPARTMENT_OTHER): Payer: Self-pay

## 2023-11-20 ENCOUNTER — Ambulatory Visit (INDEPENDENT_AMBULATORY_CARE_PROVIDER_SITE_OTHER)

## 2023-11-20 DIAGNOSIS — I442 Atrioventricular block, complete: Secondary | ICD-10-CM | POA: Diagnosis not present

## 2023-11-20 MED ORDER — AMOXICILLIN-POT CLAVULANATE 500-125 MG PO TABS
1.0000 | ORAL_TABLET | Freq: Two times a day (BID) | ORAL | 0 refills | Status: DC
  Filled 2023-11-20: qty 10, 5d supply, fill #0

## 2023-11-20 MED ORDER — METFORMIN HCL 1000 MG PO TABS
1000.0000 mg | ORAL_TABLET | Freq: Two times a day (BID) | ORAL | 3 refills | Status: AC
  Filled 2023-11-20: qty 180, 90d supply, fill #0
  Filled 2024-02-19: qty 180, 90d supply, fill #1

## 2023-11-20 NOTE — Addendum Note (Signed)
 Addended by: WATT RAISIN C on: 11/20/2023 12:04 PM   Modules accepted: Orders

## 2023-11-20 NOTE — Telephone Encounter (Signed)
 Urine culture final came back  Results for orders placed or performed in visit on 11/16/23  Urine Culture   Collection Time: 11/16/23  1:22 PM   Specimen: Urine  Result Value Ref Range   MICRO NUMBER: [id number]    SPECIMEN QUALITY: Adequate    Sample Source NOT GIVEN    STATUS: FINAL    ISOLATE 1: Proteus mirabilis (A)       Susceptibility   Proteus mirabilis - URINE CULTURE, REFLEX    AMOX/CLAVULANIC 4 Sensitive     AMPICILLIN/SULBACTAM 16 Intermediate     CEFAZOLIN  4 Intermediate     CEFTAZIDIME <=0.5 Sensitive     CEFEPIME <=0.12 Sensitive     CEFTRIAXONE  <=0.25 Sensitive     CIPROFLOXACIN 0.25 Sensitive     LEVOFLOXACIN 0.5 Sensitive     GENTAMICIN  <=1 Sensitive     MEROPENEM <=0.25 Sensitive     NITROFURANTOIN  64 Resistant     PIP/TAZO <=4 Sensitive     TRIMETH /SULFA * <=20 Sensitive      * Legend: S = Susceptible  I = Intermediate R = Resistant  NS = Not susceptible SDD = Susceptible Dose Dependent * = Not Tested  NR = Not Reported **NN = See Therapy Comments   POCT urinalysis dipstick   Collection Time: 11/16/23  4:22 PM  Result Value Ref Range   Color, UA orange (A) yellow   Clarity, UA turbid (A) clear   Glucose, UA negative negative mg/dL   Bilirubin, UA large (A) negative   Ketones, POC UA trace (5) (A) negative mg/dL   Spec Grav, UA 8.984 8.989 - 1.025   Blood, UA negative negative   pH, UA 6.0 5.0 - 8.0   Protein Ur, POC negative negative mg/dL   Urobilinogen, UA 0.2 0.2 or 1.0 E.U./dL   Nitrite, UA Positive (A) Negative   Leukocytes, UA Large (3+) (A) Negative

## 2023-11-20 NOTE — Addendum Note (Signed)
 Addended by: WATT RAISIN C on: 11/20/2023 12:19 PM   Modules accepted: Orders

## 2023-11-20 NOTE — Telephone Encounter (Signed)
 Copied from CRM #[id number]. Topic: General - Other >> Nov 20, 2023 11:43 AM Jasmin G wrote: Reason for CRM: Pt requested a call back from one of Dr. Eleanore nurses to discuss recent message request, please refer to recent Encounters for more info. Call pt back at [phone].

## 2023-11-21 NOTE — Telephone Encounter (Signed)
 Spoke with pt, pt has spoken to PCP via Mychart. Pt states she feels much better.

## 2023-11-22 ENCOUNTER — Ambulatory Visit: Payer: Self-pay | Admitting: Cardiology

## 2023-11-22 LAB — CUP PACEART REMOTE DEVICE CHECK
Battery Remaining Longevity: 128 mo
Battery Remaining Percentage: 95.5 %
Battery Voltage: 3.02 V
Brady Statistic AP VP Percent: 1 %
Brady Statistic AP VS Percent: 2.9 %
Brady Statistic AS VP Percent: 1 %
Brady Statistic AS VS Percent: 97 %
Brady Statistic RA Percent Paced: 2.8 %
Brady Statistic RV Percent Paced: 1 %
Date Time Interrogation Session: 20250929020014
Implantable Lead Connection Status: 753985
Implantable Lead Connection Status: 753985
Implantable Lead Implant Date: 20250516
Implantable Lead Implant Date: 20250516
Implantable Lead Location: 753859
Implantable Lead Location: 753860
Implantable Pulse Generator Implant Date: 20250516
Lead Channel Impedance Value: 400 Ohm
Lead Channel Impedance Value: 510 Ohm
Lead Channel Pacing Threshold Amplitude: 0.75 V
Lead Channel Pacing Threshold Amplitude: 0.875 V
Lead Channel Pacing Threshold Pulse Width: 0.5 ms
Lead Channel Pacing Threshold Pulse Width: 0.5 ms
Lead Channel Sensing Intrinsic Amplitude: 4.2 mV
Lead Channel Sensing Intrinsic Amplitude: 8.3 mV
Lead Channel Setting Pacing Amplitude: 1.125
Lead Channel Setting Pacing Amplitude: 1.75 V
Lead Channel Setting Pacing Pulse Width: 0.5 ms
Lead Channel Setting Sensing Sensitivity: 2 mV
Pulse Gen Model: 2272
Pulse Gen Serial Number: 8276359

## 2023-11-23 NOTE — Progress Notes (Signed)
 Remote PPM Transmission

## 2023-12-05 ENCOUNTER — Telehealth: Payer: Self-pay | Admitting: Gastroenterology

## 2023-12-07 ENCOUNTER — Telehealth: Payer: Self-pay | Admitting: Family Medicine

## 2023-12-07 ENCOUNTER — Encounter: Payer: Self-pay | Admitting: *Deleted

## 2023-12-07 NOTE — Telephone Encounter (Signed)
 error

## 2023-12-07 NOTE — Telephone Encounter (Signed)
 Pt brought in a copy of her GI letter following her scope in 2017, I will fax this to [facility].  Called them and [facility] with this info Her colonoscopy was done 5/25, 2017 at [facility], [address]  [phone]

## 2023-12-08 ENCOUNTER — Other Ambulatory Visit (HOSPITAL_BASED_OUTPATIENT_CLINIC_OR_DEPARTMENT_OTHER): Payer: Self-pay

## 2023-12-08 ENCOUNTER — Encounter: Payer: Self-pay | Admitting: *Deleted

## 2023-12-08 ENCOUNTER — Other Ambulatory Visit (HOSPITAL_COMMUNITY): Payer: Self-pay

## 2023-12-08 ENCOUNTER — Other Ambulatory Visit: Payer: Self-pay | Admitting: Family Medicine

## 2023-12-11 ENCOUNTER — Encounter (HOSPITAL_BASED_OUTPATIENT_CLINIC_OR_DEPARTMENT_OTHER): Payer: Self-pay | Admitting: *Deleted

## 2023-12-11 ENCOUNTER — Other Ambulatory Visit (HOSPITAL_BASED_OUTPATIENT_CLINIC_OR_DEPARTMENT_OTHER): Payer: Self-pay

## 2023-12-11 ENCOUNTER — Ambulatory Visit (HOSPITAL_BASED_OUTPATIENT_CLINIC_OR_DEPARTMENT_OTHER): Admitting: Cardiovascular Disease

## 2023-12-11 ENCOUNTER — Telehealth: Payer: Self-pay

## 2023-12-11 ENCOUNTER — Encounter (HOSPITAL_BASED_OUTPATIENT_CLINIC_OR_DEPARTMENT_OTHER): Payer: Self-pay | Admitting: Cardiovascular Disease

## 2023-12-11 VITALS — BP 156/66 | HR 72 | Ht 61.0 in | Wt 244.0 lb

## 2023-12-11 DIAGNOSIS — I350 Nonrheumatic aortic (valve) stenosis: Secondary | ICD-10-CM | POA: Diagnosis not present

## 2023-12-11 DIAGNOSIS — I10 Essential (primary) hypertension: Secondary | ICD-10-CM | POA: Diagnosis not present

## 2023-12-11 MED ORDER — LOSARTAN POTASSIUM 100 MG PO TABS
100.0000 mg | ORAL_TABLET | Freq: Every day | ORAL | 3 refills | Status: AC
  Filled 2023-12-11: qty 90, 90d supply, fill #0

## 2023-12-11 NOTE — Progress Notes (Signed)
 Cardiology Office Note:  .   Date:  12/11/2023  ID:  Jasmin [name], DOB January 07, 1946, MRN [id number] PCP: Jasmin Harlene BROCKS, MD  [facility] Providers Cardiologist:  Jasmin Scarce, MD Electrophysiologist:  Jasmin Gladis Norton, MD    History of Present Illness: .    Jasmin [name] is a 78 y.o. female with a hx of moderate AS, mild ascending aortic aneurysm, DM, HTN, HLD, Hx CVA, OSA, GERD here for follow up.  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  She had recently started Ozempic .  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 Patient's prior cardiac history is notable for prior Coronary calcium  score on 02/17/22 of 1245 (left main: 423, LAD: 174, Lcx 158, RCA 489; total percentile 95%); she was also found to have severe mitral annular calcifications. Nuclear stress test on 03/03/22 demonstrated normal LVEF 71% with normal myocardial perfusion.  She underwent left and right heart cath and was found to have normal coronary arteries 06/2023.  Echo revealed LVEF 60-65% with moderate asymmetric LVH of the basal septum.  She had grade 2 diastolic dysfunction and moderate aortic stenosis.  Mean gradient was 23 mmHg.  Ascending aorta was 4.0 cm.  She underwent dual-chamber pacemaker implantation and has done well since that time.  She saw Jasmin Finder, NP 08/2023 and blood pressure was elevated in the office but controlled at home.  Discussed the use of AI scribe software for clinical note transcription with the patient, who gave verbal consent to proceed.  History of Present Illness Jasmin [name] experiences occasional swelling in her legs at night and difficulty wearing rings due to finger swelling, which she attributes to fluid retention or arthritis. Her blood pressure at home has been around 142/77, and she takes her blood pressure medication in the evening, monitoring it mostly in the afternoon.  She is actively managing her diet by reducing salt intake and trying  to lose weight to benefit her knees and heart. She snacks on fruits like apples and oranges. She is diabetic and has maintained good control of her diabetes, with recent adjustments to her metformin  dosage to two tablets in the morning and two in the afternoon, which has helped lower her morning blood sugar levels.  Her cholesterol levels were checked in May, with an LDL of 60, and she is on simvastatin . She also takes Plavix  and had an echocardiogram during a previous hospital visit, which showed a mildly stiff aortic valve with a mean gradient of 23.  She has a pacemaker and inquires about any travel restrictions, particularly regarding air travel, and mentions needing antibiotics before dental procedures to prevent infection.  She attends [employer] two to three times a week, engaging in cardio and knee strengthening exercises due to previous knee replacements. She experiences some balance issues and feels her knees are not as strong as desired, despite efforts to strengthen them at the gym. She also engages in housework and gardening.   ROS:  As per HPI  Studies Reviewed: .       Echo 07/03/23:  1. Left ventricular ejection fraction, by estimation, is 60 to 65%. The  left ventricle has normal function. The left ventricle has no regional  wall motion abnormalities. There is moderate asymmetric left ventricular  hypertrophy of the basal-septal  segment. Left ventricular diastolic parameters are consistent with Grade  II diastolic dysfunction (pseudonormalization).   2. Right ventricular systolic function is normal. The right ventricular  size is  normal. Tricuspid regurgitation signal is inadequate for assessing  PA pressure.   3. Left atrial size was mildly dilated.   4. The mitral valve is degenerative. Trivial mitral valve regurgitation.  No evidence of mitral stenosis. Moderate mitral annular calcification.   5. The aortic valve is tricuspid. There is moderate calcification of the   aortic valve. Aortic valve regurgitation is mild. Moderate aortic valve  stenosis. Aortic valve mean gradient measures 23.0 mmHg, AVA 1.38 cm^2.   6. Aortic dilatation noted. There is mild dilatation of the ascending  aorta, measuring 40 mm.   7. The inferior vena cava is dilated in size with >50% respiratory  variability, suggesting right atrial pressure of 8 mmHg.   Risk Assessment/Calculations:     HYPERTENSION CONTROL Vitals:   12/11/23 0755 12/11/23 0818  BP: (!) 160/72 (!) 156/66    The patient's blood pressure is elevated above target today.  In order to address the patient's elevated BP: A current anti-hypertensive medication was adjusted today.          Physical Exam:   VS:  BP (!) 156/66   Pulse 72   Ht 5' 1 (1.549 m)   Wt 244 lb (110.7 kg)   BMI 46.10 kg/m  , BMI Body mass index is 46.1 kg/m. GENERAL:  Well appearing HEENT: Pupils equal round and reactive, fundi not visualized, oral mucosa unremarkable NECK:  No jugular venous distention, waveform within normal limits, carotid upstroke brisk and symmetric, no bruits, no thyromegaly LUNGS:  Clear to auscultation bilaterally HEART:  RRR.  PMI not displaced or sustained,S1 and S2 within normal limits, no S3, no S4, no clicks, no rubs, II/VI systolic murmur at the LUSB ABD:  Flat, positive bowel sounds normal in frequency in pitch, no bruits, no rebound, no guarding, no midline pulsatile mass, no hepatomegaly, no splenomegaly EXT:  2 plus pulses throughout, no edema, no cyanosis no clubbing SKIN:  No rashes no nodules NEURO:  Cranial nerves II through XII grossly intact, motor grossly intact throughout PSYCH:  Cognitively intact, oriented to person place and time   ASSESSMENT AND PLAN: .    Assessment & Plan #Aortic valve stenosis Moderate stenosis with mean gradient 23 mmHg, asymptomatic for severe stenosis.  She is asymptomatic.  - Repeat echocardiogram in May to assess aortic valve status.  # Primary  Hypertension Home BP averages 142/77, above target <130/80. Office reading 156/66 today but typically around 130 systolic with her PCP.  She is on losartan  75mg  daily. - Increase losartan  to 100 mg daily using two 50 mg tablets until current supply exhausted, then switch to 100 mg tablet. - Provide BP log sheet, instruct to check BP twice daily. - Schedule follow-up with pharmacist Kristen for BP management, bring home BP machine and readings.  # Type 2 diabetes mellitus Well-controlled with A1c 6.3. Metformin  adjustment improved morning blood sugar levels. - Continue metformin  regimen: two tablets morning, two afternoon.  # Hyperlipidemia Cholesterol well-controlled, LDL <70 mg/dL on simvastatin .  # Peripheral edema Mild nocturnal leg and finger swelling, likely due to arthritis or fluid retention, not cardiac.  # Pacemaker No issues with pacemaker, no travel restrictions.  She follows with Dr. Inocencio.  OK to have dental cleaning.  No antibiotic ppx indicated.   General Health Maintenance Engaging in regular exercise and dietary modifications. - Continue regular exercise and dietary modifications, focus on reducing sodium and carbohydrate intake.    Dispo: f/u 2 months for PharmD, May 2025 for me.  Signed, Jasmin Scarce, MD

## 2023-12-11 NOTE — Telephone Encounter (Signed)
 Copied from CRM #[id number]. Topic: Clinical - Medical Advice >> Dec 11, 2023 12:43 PM Dedra B wrote: Reason for CRM: Pt received a letter form insurance suggesting she get an AWV and physical. Pt asked if her previous visit can be considered a physical. I explained that it was a 6 mo f/u. Pt would like a call from Dr. Eleanore nurse to further explain the difference and to discuss whether or not she needs the AWV and physical. Pls call her home number [phone].

## 2023-12-11 NOTE — Patient Instructions (Addendum)
 Medication Instructions:  INCREASE YOUR LOSARTAN  TO 100 MG DAILY   *If you need a refill on your cardiac medications before your next appointment, please call your pharmacy*  Lab Work: NONE   Testing/Procedures: Your physician has requested that you have an echocardiogram. Echocardiography is a painless test that uses sound waves to create images of your heart. It provides your doctor with information about the size and shape of your heart and how well your heart's chambers and valves are working. This procedure takes approximately one hour. There are no restrictions for this procedure. Please do NOT wear cologne, perfume, aftershave, or lotions (deodorant is allowed). Please arrive 15 minutes prior to your appointment time.  Please note: We ask at that you not bring children with you during ultrasound (echo/ vascular) testing. Due to room size and safety concerns, children are not allowed in the ultrasound rooms during exams. Our front office staff cannot provide observation of children in our lobby area while testing is being conducted. An adult accompanying a patient to their appointment will only be allowed in the ultrasound room at the discretion of the ultrasound technician under special circumstances. We apologize for any inconvenience.  TO BE DONE IN MAY ABOUT A WEEK PRIOR TO FOLLOW UP   Follow-Up: At [facility], you and your health needs are our priority.  As part of our continuing mission to provide you with exceptional heart care, our providers are all part of one team.  This team includes your primary Cardiologist (physician) and Advanced Practice Providers or APPs (Physician Assistants and Nurse Practitioners) who all work together to provide you with the care you need, when you need it.  Your next appointment:   2 month(s)  Provider:   Kristin Alvstad, PharmD    IN MAY WITH DR [facility], CAITLIN W NP, OR MICHELLE S NP    We recommend signing up for the patient  portal called MyChart.  Sign up information is provided on this After Visit Summary.  MyChart is used to connect with patients for Virtual Visits (Telemedicine).  Patients are able to view lab/test results, encounter notes, upcoming appointments, etc.  Non-urgent messages can be sent to your provider as well.   To learn more about what you can do with MyChart, go to [email or web address].   Other Instructions MONITOR YOUR BLOOD PRESSURE 2 TIMES A DAY AND BRING YOUR READINGS TO YOUR FOLLOW UP APPOINTMENT IN 2 MONTHS   YOU ARE OK TO HAVE DENTAL CLEANINGS FROM A CARDIAC STANDPOINT

## 2023-12-12 ENCOUNTER — Other Ambulatory Visit (HOSPITAL_COMMUNITY): Payer: Self-pay

## 2023-12-13 ENCOUNTER — Ambulatory Visit: Payer: Self-pay

## 2023-12-13 NOTE — Telephone Encounter (Signed)
 FYI Only or Action Required?: FYI only for provider.  Patient was last seen in primary care on 11/15/2023 by Copland, Harlene BROCKS, MD.  Called Nurse Triage reporting Insect Bite.  Symptoms began today.  Interventions attempted: OTC medications: benadryl.  Symptoms are: unchanged.  Triage Disposition: Home Care  Patient/caregiver understands and will follow disposition?: Yes     Copied from CRM [id number]. Topic: Clinical - Red Word Triage >> Dec 13, 2023  2:10 PM Rosina BIRCH wrote: Red Word that prompted transfer to Nurse Triage: patient called stating she was stung by bee ten minutes ago on her left hand middle finger. Patient took a benadryl and it is red, stingy and has swelled a little Reason for Disposition  Normal local reaction to bee, wasp, or yellow jacket sting  Answer Assessment - Initial Assessment Questions PT states she was stung prior to call on middle finger of left hand on the underside between fingers. It is red and swollen. She took Benadryl prior to calling.  1. TYPE: What type of sting was it? (e.g., bee, yellow jacket, unknown)      Bee 2. ONSET: When did it occur?      About 20 minutes ago 3. LOCATION: Where is the sting located?  How many stings?     Middle finger of left hand  4. SWELLING SIZE: How big is the swelling? (e.g., inches or cm)     Swelling:not a a lot 5. REDNESS: Is the area red or pink? If Yes, ask: What size is area of redness? (e.g., inches or cm). When did the redness start?     Immediately  6. PAIN: Is there any pain? If Yes, ask: How bad is it?  (Scale 0-10; or none, mild, moderate, severe)     no 7. ITCHING: Is there any itching? If Yes, ask: How bad is it?      no 8. RESPIRATORY DISTRESS: Describe your breathing.     Fine currently 9. PRIOR REACTIONS: Have you had any severe allergic reactions to stings in the past? If Yes, ask: What happened?     Does not believe she has ever had allergic reaction  before. She's nervous because she had a pacemaker placed in may 10. OTHER SYMPTOMS: Do you have any other symptoms? (e.g., abdomen pain, face or tongue swelling, new rash elsewhere, vomiting)       no  Protocols used: Bee or Yellow Jacket Sting-A-AH

## 2023-12-25 DIAGNOSIS — E785 Hyperlipidemia, unspecified: Secondary | ICD-10-CM | POA: Diagnosis not present

## 2023-12-25 DIAGNOSIS — E669 Obesity, unspecified: Secondary | ICD-10-CM | POA: Diagnosis not present

## 2023-12-25 DIAGNOSIS — I129 Hypertensive chronic kidney disease with stage 1 through stage 4 chronic kidney disease, or unspecified chronic kidney disease: Secondary | ICD-10-CM | POA: Diagnosis not present

## 2023-12-25 DIAGNOSIS — E11620 Type 2 diabetes mellitus with diabetic dermatitis: Secondary | ICD-10-CM | POA: Diagnosis not present

## 2023-12-25 DIAGNOSIS — E1122 Type 2 diabetes mellitus with diabetic chronic kidney disease: Secondary | ICD-10-CM | POA: Diagnosis not present

## 2023-12-25 DIAGNOSIS — N189 Chronic kidney disease, unspecified: Secondary | ICD-10-CM | POA: Diagnosis not present

## 2023-12-25 DIAGNOSIS — Z8249 Family history of ischemic heart disease and other diseases of the circulatory system: Secondary | ICD-10-CM | POA: Diagnosis not present

## 2023-12-25 DIAGNOSIS — K219 Gastro-esophageal reflux disease without esophagitis: Secondary | ICD-10-CM | POA: Diagnosis not present

## 2023-12-25 DIAGNOSIS — I2510 Atherosclerotic heart disease of native coronary artery without angina pectoris: Secondary | ICD-10-CM | POA: Diagnosis not present

## 2024-01-01 ENCOUNTER — Other Ambulatory Visit (HOSPITAL_BASED_OUTPATIENT_CLINIC_OR_DEPARTMENT_OTHER): Payer: Self-pay

## 2024-01-01 ENCOUNTER — Other Ambulatory Visit: Payer: Self-pay | Admitting: Family Medicine

## 2024-01-01 MED ORDER — ACCU-CHEK SOFTCLIX LANCETS MISC
12 refills | Status: AC
  Filled 2024-01-01 – 2024-01-02 (×2): qty 100, 90d supply, fill #0

## 2024-01-02 ENCOUNTER — Other Ambulatory Visit (HOSPITAL_BASED_OUTPATIENT_CLINIC_OR_DEPARTMENT_OTHER): Payer: Self-pay

## 2024-01-04 ENCOUNTER — Other Ambulatory Visit (HOSPITAL_BASED_OUTPATIENT_CLINIC_OR_DEPARTMENT_OTHER): Payer: Self-pay

## 2024-01-04 DIAGNOSIS — M79604 Pain in right leg: Secondary | ICD-10-CM | POA: Diagnosis not present

## 2024-01-04 DIAGNOSIS — M51369 Other intervertebral disc degeneration, lumbar region without mention of lumbar back pain or lower extremity pain: Secondary | ICD-10-CM | POA: Diagnosis not present

## 2024-01-04 DIAGNOSIS — Z96641 Presence of right artificial hip joint: Secondary | ICD-10-CM | POA: Diagnosis not present

## 2024-01-04 DIAGNOSIS — M5416 Radiculopathy, lumbar region: Secondary | ICD-10-CM | POA: Diagnosis not present

## 2024-01-04 DIAGNOSIS — M5135 Other intervertebral disc degeneration, thoracolumbar region: Secondary | ICD-10-CM | POA: Diagnosis not present

## 2024-01-04 DIAGNOSIS — Z471 Aftercare following joint replacement surgery: Secondary | ICD-10-CM | POA: Diagnosis not present

## 2024-01-04 DIAGNOSIS — M25551 Pain in right hip: Secondary | ICD-10-CM | POA: Diagnosis not present

## 2024-01-04 MED ORDER — METHOCARBAMOL 500 MG PO TABS
500.0000 mg | ORAL_TABLET | Freq: Four times a day (QID) | ORAL | 0 refills | Status: AC
  Filled 2024-01-04: qty 40, 10d supply, fill #0

## 2024-01-04 MED ORDER — MELOXICAM 15 MG PO TABS
15.0000 mg | ORAL_TABLET | Freq: Every day | ORAL | 2 refills | Status: AC
  Filled 2024-01-04: qty 30, 30d supply, fill #0
  Filled 2024-02-19: qty 30, 30d supply, fill #1

## 2024-01-05 ENCOUNTER — Ambulatory Visit (INDEPENDENT_AMBULATORY_CARE_PROVIDER_SITE_OTHER): Admitting: *Deleted

## 2024-01-05 ENCOUNTER — Other Ambulatory Visit: Payer: Self-pay

## 2024-01-05 ENCOUNTER — Telehealth: Payer: Self-pay | Admitting: *Deleted

## 2024-01-05 ENCOUNTER — Other Ambulatory Visit (HOSPITAL_BASED_OUTPATIENT_CLINIC_OR_DEPARTMENT_OTHER): Payer: Self-pay

## 2024-01-05 VITALS — BP 160/70 | HR 73 | Temp 99.1°F | Resp 16 | Ht 61.0 in | Wt 240.2 lb

## 2024-01-05 DIAGNOSIS — L740 Miliaria rubra: Secondary | ICD-10-CM

## 2024-01-05 DIAGNOSIS — Z23 Encounter for immunization: Secondary | ICD-10-CM | POA: Diagnosis not present

## 2024-01-05 DIAGNOSIS — Z0000 Encounter for general adult medical examination without abnormal findings: Secondary | ICD-10-CM

## 2024-01-05 MED ORDER — DESONIDE 0.05 % EX CREA
TOPICAL_CREAM | CUTANEOUS | 1 refills | Status: AC
  Filled 2024-01-05: qty 60, 30d supply, fill #0
  Filled 2024-02-08: qty 60, 30d supply, fill #1

## 2024-01-05 NOTE — Patient Instructions (Addendum)
 Jasmin [name],  Thank you for taking the time for your Medicare Wellness Visit. I appreciate your continued commitment to your health goals. Please review the care plan we discussed, and feel free to reach out if I can assist you further.  Please note that Annual Wellness Visits do not include a physical exam. Some assessments may be limited, especially if the visit was conducted virtually. If needed, we may recommend an in-person follow-up with your provider.  Ongoing Care Seeing your primary care provider every 3 to 6 months helps us  monitor your health and provide consistent, personalized care.   Dr Watt: 01/09/25 8:40am.  05/15/24 8:40am Annual Wellness Visit: 01/08/25  8:20am in person  Referrals If a referral was made during today's visit and you haven't received any updates within two weeks, please contact the referred provider directly to check on the status.  Colonoscopy ([facility]):  [phone]  Recommended Screenings:  Health Maintenance  Topic Date Due   Medicare Annual Wellness Visit  01/03/2024   Eye exam for diabetics  05/02/2024   Hemoglobin A1C  05/14/2024   COVID-19 Vaccine (10 - Pfizer risk 2025-26 season) 05/14/2024   Complete foot exam   06/20/2024   Yearly kidney function blood test for diabetes  11/14/2024   Yearly kidney health urinalysis for diabetes  11/14/2024   DTaP/Tdap/Td vaccine (3 - Td or Tdap) 10/20/2030   Pneumococcal Vaccine for age over 6  Completed   Flu Shot  Completed   DEXA scan (bone density measurement)  Completed   Hepatitis C Screening  Completed   Zoster (Shingles) Vaccine  Completed   Meningitis B Vaccine  Aged Out   Breast Cancer Screening  Discontinued   Colon Cancer Screening  Discontinued       01/05/2024    8:32 AM  Advanced Directives  Does Patient Have a Medical Advance Directive? Yes  Type of Estate Agent of [place];Living will  Does patient want to make changes to medical advance directive?  No - Patient declined  Copy of Healthcare Power of Attorney in Chart? No - copy requested    Vision: Annual vision screenings are recommended for early detection of glaucoma, cataracts, and diabetic retinopathy. These exams can also reveal signs of chronic conditions such as diabetes and high blood pressure.  Dental: Annual dental screenings help detect early signs of oral cancer, gum disease, and other conditions linked to overall health, including heart disease and diabetes.  Please see the attached documents for additional preventive care recommendations.

## 2024-01-05 NOTE — Telephone Encounter (Addendum)
 Pt had AWV today.  She would like PCP to advise about the following medications.  Amoxicillin -- states cardiology told her she no longer needs these prior to dental work. Ortho told pt she definitely needs to continue this prophylaxis due to her history of 3 joint replacements. She states cardiology told the dentist it is no longer needed. Advised pt to reach out to ortho and have them send letter to dentist regarding need to continue the abx prophylaxis.  Pt wanted your recommendations.  ASA 81mg  -- believes cardiology told her she no longer needs to take ASA 81mg .  Recommendations?  Estradiol  cream -- states gyn told her this cream is no longer available and she is awaiting a replacement recommendation.  I advised pt to reach back out to Dr Alvia for recommendation.  Pt also had elevated BP today: 160/55 automated, 160/70 manual. She reports home reading was 135/70s this morning. Notes she is currently finishing up Losartan  50mg  1 1/2 tablet daily. Cardiology has sent new RX for losartan  100mg  daily to start when she completes her current supply.  Pt not currently having headaches, vision changes, chest pain or SOB. Pt is currently experiencing lower back pain radiating down right leg into her knee. Currently seeing Ortho for this. She reports new numbness/tingling of left foot and wondered if she is beginning to get fallen arches in this foot. Scheduled pt OV with PCP on 01/10/24 to assess foot and BP.  Please advise if other recommendations?

## 2024-01-05 NOTE — Progress Notes (Signed)
 Please attest this visit in the absence of patient primary care provider.    Chief Complaint  Patient presents with   Medicare Wellness     Subjective:   Jasmin [name] is a 78 y.o. female who presents for a Medicare Annual Wellness Visit.  Allergies (verified) Latex, Ciprofloxacin, Lasix  [furosemide ], Metoprolol , Metronidazole, and Tape   History: Past Medical History:  Diagnosis Date   Acid reflux disease 03/09/2015   Acute on chronic heart failure (HCC) 07/03/2023   Aortic stenosis 03/09/2015   AR (allergic rhinitis) 03/09/2015   Arthritis    Back pain    Controlled type 2 diabetes mellitus without complication, without long-term current use of insulin  (HCC) 08/08/2016   Cough    Cystocele with rectocele 08/21/2016   Decreased hearing    Diabetes mellitus without complication (HCC)    Dry mouth    Easy bruising    Essential hypertension 08/08/2016   Heart murmur    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   History of CVA (cerebrovascular accident) 08/08/2016   Seen on MRI from 08/2015   Hyperlipidemia    Hypertension    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   IBS (irritable bowel syndrome) 07/16/2015   Insomnia 07/16/2015   Joint pain    Knee pain    Left ventricular hypertrophy 07/16/2015   Leg cramping    right leg   Mixed hyperlipidemia 08/08/2016   Morbid obesity (HCC) 11/10/2015   Nasal discharge    Nuclear sclerotic cataract of left eye 07/07/2016   OAB (overactive bladder) 01/20/2016   OSA (obstructive sleep apnea) 03/09/2015   Osteoporosis 03/09/2015   Primary osteoarthritis of left knee 03/31/2015   Pulmonary hypertension (HCC) 07/16/2015   Red eyes    Right kidney stone    Shortness of breath on exertion    Skin cancer 11/04/2020   Sleep apnea    Stroke (HCC)    Swelling of both lower extremities    UTI (urinary tract infection)    Vitamin D  deficiency 03/09/2015   Past Surgical History:  Procedure Laterality Date   ABDOMINAL HYSTERECTOMY  09/14/2016    CATARACT EXTRACTION Left    CATARACT EXTRACTION Left 06/2016   COLPORRHAPHY N/A 09/14/2016   [facility]   CYSTOCELE REPAIR     FRACTURE SURGERY Left 1953   L arm   FRACTURE SURGERY Left 1985   L ankle   HERNIA REPAIR  2004   JOINT REPLACEMENT Right 2012   R TKA   JOINT REPLACEMENT Left 2017   KIDNEY STONE SURGERY     LAPAROSCOPIC ASSISTED VAGINAL HYSTERECTOMY N/A 09/14/2016   [facility]   PACEMAKER IMPLANT N/A 07/07/2023   Procedure: PACEMAKER IMPLANT;  Surgeon: Inocencio Soyla Lunger, MD;  Location: [facility] INVASIVE [facility];  Service: Cardiovascular;  Laterality: N/A;   R foot/ankle Right 2013 and 2014   plate and screws lateral foot and tendon removal medial foot in 2013, revision in 2014   RECTOCELE REPAIR     RIGHT/LEFT HEART CATH AND CORONARY ANGIOGRAPHY N/A 07/04/2023   Procedure: RIGHT/LEFT HEART CATH AND CORONARY ANGIOGRAPHY;  Surgeon: Anner Alm ORN, MD;  Location: [facility] INVASIVE CV LAB;  Service: Cardiovascular;  Laterality: N/A;   URETERAL STENT PLACEMENT     URETEROSCOPY     Family History  Problem Relation Age of Onset   Hyperlipidemia Mother    Heart disease Mother    Hypertension Mother    Stroke Mother    Thyroid  disease Mother    Hypertension Father  Heart disease Father    Social History   Occupational History   Occupation: Retired Runner, Broadcasting/film/video  Tobacco Use   Smoking status: Never    Passive exposure: Never   Smokeless tobacco: Never  Vaping Use   Vaping status: Never Used  Substance and Sexual Activity   Alcohol use: Yes    Alcohol/week: 2.0 - 3.0 standard drinks of alcohol    Types: 2 - 3 Glasses of wine per week    Comment: couple of glasses per week   Drug use: No   Sexual activity: Not Currently   Tobacco Counseling Counseling given: Not Answered  SDOH Screenings   Food Insecurity: No Food Insecurity (01/05/2024)  Housing: Low Risk  (01/05/2024)  Transportation Needs: No Transportation Needs (01/05/2024)  Utilities: Not At Risk (01/05/2024)  Alcohol  Screen: Low Risk  (01/03/2023)  Depression (PHQ2-9): Low Risk  (01/05/2024)  Financial Resource Strain: Low Risk  (01/03/2023)  Physical Activity: Sufficiently Active (01/05/2024)  Social Connections: Socially Integrated (01/05/2024)  Stress: No Stress Concern Present (01/05/2024)  Tobacco Use: Low Risk  (01/05/2024)  Health Literacy: Adequate Health Literacy (01/03/2023)   See flowsheets for full screening details  Depression Screen PHQ 2 & 9 Depression Scale- Over the past 2 weeks, how often have you been bothered by any of the following problems? Little interest or pleasure in doing things: 0 Feeling down, depressed, or hopeless (PHQ Adolescent also includes...irritable): 0 PHQ-2 Total Score: 0 Trouble falling or staying asleep, or sleeping too much: 0 Feeling tired or having little energy: 0 Poor appetite or overeating (PHQ Adolescent also includes...weight loss): 0 Feeling bad about yourself - or that you are a failure or have let yourself or your family down: 0 Trouble concentrating on things, such as reading the newspaper or watching television (PHQ Adolescent also includes...like school work): 0 Moving or speaking so slowly that other people could have noticed. Or the opposite - being so fidgety or restless that you have been moving around a lot more than usual: 0 Thoughts that you would be better off dead, or of hurting yourself in some way: 0 PHQ-9 Total Score: 0 If you checked off any problems, how difficult have these problems made it for you to do your work, take care of things at home, or get along with other people?: Not difficult at all  Depression Treatment Depression Interventions/Treatment : EYV7-0 Score <4 Follow-up Not Indicated     Goals Addressed   None    Visit info / Clinical Intake: Medicare Wellness Visit Type:: Subsequent Annual Wellness Visit Persons participating in visit:: patient Medicare Wellness Visit Mode:: In-person (required for  WTM) Information given by:: patient Interpreter Needed?: No Pre-visit prep was completed: yes AWV questionnaire completed by patient prior to visit?: no Living arrangements:: with family/others (spouse and son) Patient's Overall Health Status Rating: very good Typical amount of pain: some (has arthritis and currently having flare up of back and down right leg) Does pain affect daily life?: no (just keeps going) Are you currently prescribed opioids?: (!) yes (oxycodone )  Dietary Habits and Nutritional Risks How many meals a day?: 3 (some snacks in between) Eats fruit and vegetables daily?: yes Most meals are obtained by: preparing own meals In the last 2 weeks, have you had any of the following?: (!) nausea, vomiting, diarrhea (Has IBS) Diabetic:: (!) yes Any non-healing wounds?: no How often do you check your BS?: 1 (was 113 at home) Would you like to be referred to a Nutritionist or for  Diabetic Management? : no  Functional Status Activities of Daily Living (to include ambulation/medication): Independent Ambulation: Independent Medication Administration: Independent Home Management: Independent Manage your own finances?: yes Primary transportation is: driving Concerns about vision?: no *vision screening is required for WTM* (up to date with Lynwood Plover) Concerns about hearing?: (!) yes Uses hearing aids?: (!) yes  Fall Screening Falls in the past year?: 0 Number of falls in past year: 0 Was there an injury with Fall?: 0 Fall Risk Category Calculator: 0 Patient Fall Risk Level: Low Fall Risk  Fall Risk Patient at Risk for Falls Due to: Orthopedic patient; History of fall(s) Fall risk Follow up: Falls evaluation completed  Home and Transportation Safety: All rugs have non-skid backing?: yes All stairs or steps have railings?: yes Grab bars in the bathtub or shower?: yes Have non-skid surface in bathtub or shower?: yes Good home lighting?: yes Regular seat belt use?:  yes Hospital stays in the last year:: (!) yes How many hospital stays:: 1 Reason: Pacemaker placement  Cognitive Assessment Difficulty concentrating, remembering, or making decisions? : no Will 6CIT or Mini Cog be Completed: yes What year is it?: 0 points What month is it?: 0 points Give patient an address phrase to remember (5 components): [address] [place]  About what time is it?: 0 points Count backwards from 20 to 1: 0 points Say the months of the year in reverse: 0 points Repeat the address phrase from earlier: 0 points 6 CIT Score: 0 points  Advance Directives (For Healthcare) Does Patient Have a Medical Advance Directive?: Yes Does patient want to make changes to medical advance directive?: No - Patient declined Type of Advance Directive: Healthcare Power of [place]; Living will Copy of Healthcare Power of Attorney in Chart?: No - copy requested Copy of Living Will in Chart?: No - copy requested  Reviewed/Updated  Reviewed/Updated: Reviewed All (Medical, Surgical, Family, Medications, Allergies, Care Teams, Patient Goals)        Objective:    Today's Vitals   01/05/24 0813 01/05/24 0904  BP: (!) 160/55 (!) 160/70  Pulse: 73   Resp: 16   Temp: 99.1 F (37.3 C)   TempSrc: Oral   SpO2: 98%   Weight: 240 lb 3.2 oz (109 kg)   Height: 5' 1 (1.549 m)    Body mass index is 45.39 kg/m.  Current Medications (verified) Outpatient Encounter Medications as of 01/05/2024  Medication Sig   Accu-Chek Softclix Lancets lancets Check blood sugars once daily   Acetaminophen  (TYLENOL  ARTHRITIS PAIN PO) Take 650 mg by mouth every 8 (eight) hours as needed (PAIN).   amoxicillin  (AMOXIL ) 500 MG tablet Take 4 tablets (2,000 mg total) by mouth 1 hour prior to dental appointment.   Ascorbic Acid  (VITAMIN C  WITH ROSE HIPS) 500 MG tablet Take 500 mg by mouth daily.   aspirin  EC 81 MG tablet Take 81 mg by mouth daily. Swallow whole.   blood glucose meter kit and supplies  KIT Dispense based on patient and insurance preference. Use once daily to monitor glucose as needed (FOR ICD-9 250.00, 250.01).   CALCIUM  PO Take 1 tablet by mouth daily.   Cholecalciferol (VITAMIN D3) 2000 units TABS Take 1 tablet by mouth in the morning and at bedtime.   clopidogrel  (PLAVIX ) 75 MG tablet Take 1 tablet (75 mg total) by mouth daily.   clotrimazole -betamethasone  (LOTRISONE ) cream Apply to plantar foot daily   CRANBERRY EXTRACT PO Take 25,000 mg by mouth 2 (two) times daily.  cycloSPORINE  (RESTASIS ) 0.05 % ophthalmic emulsion Place 1 drop into both eyes 2 (two) times daily.   desmopressin  (DDAVP ) 0.2 MG tablet Take 1 tablet (0.2 mg total) by mouth at bedtime.   econazole nitrate 1 % cream Apply 1 Application topically as needed (skin irritation).   fluticasone  (FLONASE ) 50 MCG/ACT nasal spray Place 2 sprays into both nostrils daily.   glucose blood test strip Use as directed to check blood glucose daily.   losartan  (COZAAR ) 100 MG tablet Take 1 tablet (100 mg total) by mouth daily.   meloxicam  (MOBIC ) 15 MG tablet Take 1 tablet (15 mg total) by mouth daily with food. Do not combine with other NSAIDs   metFORMIN  (GLUCOPHAGE ) 1000 MG tablet Take 1 tablet (1,000 mg total) by mouth 2 (two) times daily with a meal.   methocarbamol (ROBAXIN) 500 MG tablet Take 1 tablet (500 mg total) by mouth 4 (four) times daily for 10 days.   mirabegron  ER (MYRBETRIQ ) 50 MG TB24 tablet Take 1 tablet (50 mg total) by mouth daily.   omega-3 acid ethyl esters (LOVAZA ) 1 g capsule Take 2 capsules (2 g total) by mouth 2 (two) times daily.   OXYCODONE  HCL PO Take 1 tablet by mouth as needed (pain).   pantoprazole  (PROTONIX ) 40 MG tablet Take 1 tablet (40 mg total) by mouth 2 (two) times daily.   Polyethyl Glycol-Propyl Glycol (SYSTANE OP) Apply 1 drop to eye daily as needed (for dry eyes).   PSYLLIUM PO Take 1 tablet by mouth daily.   simvastatin  (ZOCOR ) 20 MG tablet Take 1 tablet (20 mg total) by mouth at  bedtime.   solifenacin  (VESICARE ) 10 MG tablet Take 1 tablet (10 mg total) by mouth daily. Swallow tablet whole; do not crush, chew, or split.   [DISCONTINUED] desonide  (DESOWEN ) 0.05 % cream Apply as directed to affected area.   desonide  (DESOWEN ) 0.05 % cream Apply as directed to affected area.   estradiol  (ESTRACE ) 0.1 MG/GM vaginal cream Apply 0.5 grams vaginally nightly for 2 weeks, then apply 0.5 grams 2 times weekly (Patient not taking: Reported on 01/05/2024)   [DISCONTINUED] mirabegron  ER (MYRBETRIQ ) 50 MG TB24 tablet Take 1 tablet (50 mg total) by mouth daily.   No facility-administered encounter medications on file as of 01/05/2024.   Hearing/Vision screen No results found. Immunizations and Health Maintenance Health Maintenance  Topic Date Due   OPHTHALMOLOGY EXAM  05/02/2024   HEMOGLOBIN A1C  05/14/2024   COVID-19 Vaccine (10 - Pfizer risk 2025-26 season) 05/14/2024   FOOT EXAM  06/20/2024   Diabetic kidney evaluation - eGFR measurement  11/14/2024   Diabetic kidney evaluation - Urine ACR  11/14/2024   Medicare Annual Wellness (AWV)  01/04/2025   DTaP/Tdap/Td (3 - Td or Tdap) 10/20/2030   Pneumococcal Vaccine: 50+ Years  Completed   Influenza Vaccine  Completed   DEXA SCAN  Completed   Hepatitis C Screening  Completed   Zoster Vaccines- Shingrix  Completed   Meningococcal B Vaccine  Aged Out   Mammogram  Discontinued   Colonoscopy  Discontinued        Assessment/Plan:  This is a routine wellness examination for Arshia.  Patient Care Team: Copland, Harlene BROCKS, MD as PCP - General (Family Medicine) Raford Riggs, MD as PCP - Cardiology (Cardiology) Inocencio Soyla Lunger, MD as PCP - Electrophysiology (Cardiology) Nathanael Deatrice Barrio, MD as Consulting Physician (Pulmonary Disease) Jadine Sieving, MD as Consulting Physician (Orthopedic Surgery) Hollar, Lahoma Greener, MD as Consulting Physician (Dermatology) Charlott Purchase  VEAR, MD as Consulting  Physician (Sports Medicine) Puschinsky, Charlie ORN., MD as Consulting Physician (General Surgery) Pill, Elia, MD as Consulting Physician (Orthopedic Surgery) Celestia Lamar CROME., DDS as Consulting Physician (Dentistry) Tobie Karole SAILOR., MD (Obstetrics and Gynecology) Alvia Dorothyann LABOR, MD as Referring Physician (Gynecology) Renate Lynwood Hussar, MD as Referring Physician  I have personally reviewed and noted the following in the patient's chart:   Medical and social history Use of alcohol, tobacco or illicit drugs  Current medications and supplements including opioid prescriptions. Functional ability and status Nutritional status Physical activity Advanced directives List of other physicians Hospitalizations, surgeries, and ER visits in previous 12 months Vitals Screenings to include cognitive, depression, and falls Referrals and appointments  Orders Placed This Encounter  Procedures   Pneumococcal conjugate vaccine 20-valent (Prevnar 20)   In addition, I have reviewed and discussed with patient certain preventive protocols, quality metrics, and best practice recommendations. A written personalized care plan for preventive services as well as general preventive health recommendations were provided to patient.   Lolita Libra, CMA   01/05/2024   Return in 1 year (on 01/04/2025).  After Visit Summary: (In Person-Printed) AVS printed and given to the patient  Nurse Notes: see phone note

## 2024-01-06 NOTE — Progress Notes (Signed)
 [facility] [address], [address] [place], [place] [address] [phone]  Date:  01/10/2024   Name:  Jasmin [name]   DOB:  1945-06-23   MRN:  [id number]  PCP:  Watt Harlene BROCKS, MD    Chief Complaint: Numbness (Elevated BP reading during AWV/Tingling and numbness in L foot my normal stuff /Discuss referral for colonoscopy /)   History of Present Illness:  Jasmin [name] is a 78 y.o. very pleasant female patient who presents with the following:  Patient recently did her annual Medicare wellness and had a few concerns which I was asked to address: Pt had AWV today.  She would like PCP to advise about the following medications.   Amoxicillin -- states cardiology told her she no longer needs these prior to dental work. Ortho told pt she definitely needs to continue this prophylaxis due to her history of 3 joint replacements. She states cardiology told the dentist it is no longer needed. Advised pt to reach out to ortho and have them send letter to dentist regarding need to continue the abx prophylaxis.  Pt wanted your recommendations. ASA 81mg  -- believes cardiology told her she no longer needs to take ASA 81mg .  Recommendations? Estradiol  cream -- states gyn told her this cream is no longer available and she is awaiting a replacement recommendation.  I advised pt to reach back out to Dr Alvia for recommendation. Pt also had elevated BP today: 160/55 automated, 160/70 manual. She reports home reading was 135/70s this morning. Notes she is currently finishing up Losartan  50mg  1 1/2 tablet daily. Cardiology has sent new RX for losartan  100mg  daily to start when she completes her current supply.  Pt not currently having headaches, vision changes, chest pain or SOB. Pt is currently experiencing lower back pain radiating down right leg into her knee. Currently seeing Ortho for this. She reports new numbness/tingling of left foot and  wondered if she is beginning to get fallen arches in this foot. Scheduled pt OV with PCP on 01/10/24 to assess foot and BP.  Please advise if other recommendations?     Otherwise I saw her most recently in September for diabetes follow-up History of well-controlled diabetes, hypertension, hyperlipidemia, stroke, OSA, obesity, pulmonary hypertension, aortic stenosis, heart block status post pacemaker  She is followed by pulmonology, Dr. Nathanael for her pulmonary hypertension and sleep apnea Her cardiologist is Dr MARLA- he is following due to coronary calcium  in 95th percentile  Her electrophysiologist is Dr. Inocencio  Most recent A1c in September 7.6% We had her gradually increase her dose of metformin  She has as of now not wanted to add a GLP-1  Lumbar x-ray report IMPRESSION:  No acute compression deformity. Grade 1 anterolisthesis of L4 and L5. Moderate multilevel degenerative disc disease. Severe lower lumbar spine facet arthropathy. Arterial calcifications.  Discussed the use of AI scribe software for clinical note transcription with the patient, who gave verbal consent to proceed.  History of Present Illness Jasmin [name] is a 78 year old female with hypertension and arthritis who presents for medication management and evaluation of back pain.  I advised her certainly okay to continue using amoxicillin  prior to dental work if recommended by her orthopedist-although she may no longer need it from a cardiac standpoint  She is currently taking losartan  for hypertension, which was recently increased from 75 mg to 100 mg. Her blood pressure readings have varied, with recent measurements of 128/80  mmHg and 152/90 mmHg. She monitors her blood pressure twice daily.   She has been experiencing back pain that radiates to her knees, which started two weeks ago. Initially, the pain was well-managed with medication but has worsened recently, causing her to limp. She reports that her orthopedic provider  told her she has arthritis in her back. She also experiences her foot going to sleep sometimes when walking and cannot stand for long periods.  She uses an estrogen cream twice a week, initially prescribed to help reduce urinary tract infections. There was confusion about the availability of the cream, but she recently picked up a refill. She also uses a steroid cream for an unspecified condition, typically applying it right under her vagina and following with a powder afterwards.  She has a history of joint replacements, including both knees and a hip. She engages in exercises to strengthen her knees and legs. Her blood sugar levels have been stable, with recent readings around 118 mg/dL in the mornings.     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/2022   Obesity, Beginning BMI 46.86 03/15/2022   Elevated coronary artery calcium  score 02/24/2022   Left sided sciatica 07/05/2021   Skin cancer 11/04/2020   BMI 45.0-49.9, adult (HCC) 07/31/2020   Swelling of both lower extremities    Shortness of breath on exertion    Red eyes    Knee pain    Joint pain    Decreased hearing    Back pain    Nocturia 12/26/2018   Recurrent UTI 12/26/2018   Vaginal atrophy 12/26/2018   Hyperopia with astigmatism and presbyopia, bilateral 02/09/2018   Long term current use of oral hypoglycemic drug 02/09/2018   PCO (posterior capsular opacification), left 02/09/2018   Pseudophakia of left eye 02/09/2018   PVD (posterior vitreous detachment), both  eyes 02/09/2018   Arthritis 12/16/2016   Right kidney stone    Hyperlipidemia    Heart murmur    Diabetes mellitus without complication (HCC)    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   Cystocele with rectocele 08/21/2016   Mixed hyperlipidemia 08/08/2016   Controlled type 2 diabetes mellitus without complication, without long-term current use of insulin  (HCC) 08/08/2016   Essential hypertension 08/08/2016   History of CVA (cerebrovascular accident) 08/08/2016   Nuclear sclerotic cataract of left eye 07/07/2016   OAB (overactive bladder) 01/20/2016   Class 3 severe obesity with serious comorbidity and body mass index (BMI) of 45.0 to 49.9 in adult (HCC) 11/10/2015   IBS (irritable bowel syndrome) 07/16/2015   Insomnia 07/16/2015   Left ventricular hypertrophy 07/16/2015   Pulmonary hypertension (HCC) 07/16/2015   Primary osteoarthritis of left knee 03/31/2015   Acid reflux disease 03/09/2015   Aortic stenosis 03/09/2015   AR (allergic rhinitis) 03/09/2015   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   OSA on CPAP 03/09/2015   Osteoporosis 03/09/2015   Vitamin D  deficiency 03/09/2015   Left tibialis posterior tendinitis 12/13/2013    Past Medical History:  Diagnosis Date   Acid reflux disease 03/09/2015  Acute on chronic heart failure (HCC) 07/03/2023   Aortic stenosis 03/09/2015   AR (allergic rhinitis) 03/09/2015   Arthritis    Back pain    Controlled type 2 diabetes mellitus without complication, without long-term current use of insulin  (HCC) 08/08/2016   Cough    Cystocele with rectocele 08/21/2016   Decreased hearing    Diabetes mellitus without complication (HCC)    Dry mouth    Easy bruising    Essential hypertension 08/08/2016   Heart murmur    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   History of CVA (cerebrovascular accident) 08/08/2016   Seen on MRI from 08/2015   Hyperlipidemia    Hypertension    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   IBS (irritable  bowel syndrome) 07/16/2015   Insomnia 07/16/2015   Joint pain    Knee pain    Left ventricular hypertrophy 07/16/2015   Leg cramping    right leg   Mixed hyperlipidemia 08/08/2016   Morbid obesity (HCC) 11/10/2015   Nasal discharge    Nuclear sclerotic cataract of left eye 07/07/2016   OAB (overactive bladder) 01/20/2016   OSA (obstructive sleep apnea) 03/09/2015   Osteoporosis 03/09/2015   Primary osteoarthritis of left knee 03/31/2015   Pulmonary hypertension (HCC) 07/16/2015   Red eyes    Right kidney stone    Shortness of breath on exertion    Skin cancer 11/04/2020   Sleep apnea    Stroke (HCC)    Swelling of both lower extremities    UTI (urinary tract infection)    Vitamin D  deficiency 03/09/2015    Past Surgical History:  Procedure Laterality Date   ABDOMINAL HYSTERECTOMY  09/14/2016   CATARACT EXTRACTION Left    CATARACT EXTRACTION Left 06/2016   COLPORRHAPHY N/A 09/14/2016   [facility]   CYSTOCELE REPAIR     FRACTURE SURGERY Left 1953   L arm   FRACTURE SURGERY Left 1985   L ankle   HERNIA REPAIR  2004   JOINT REPLACEMENT Right 2012   R TKA   JOINT REPLACEMENT Left 2017   KIDNEY STONE SURGERY     LAPAROSCOPIC ASSISTED VAGINAL HYSTERECTOMY N/A 09/14/2016   [facility]   PACEMAKER IMPLANT N/A 07/07/2023   Procedure: PACEMAKER IMPLANT;  Surgeon: Inocencio Soyla Lunger, MD;  Location: [facility] INVASIVE CV LAB;  Service: Cardiovascular;  Laterality: N/A;   R foot/ankle Right 2013 and 2014   plate and screws lateral foot and tendon removal medial foot in 2013, revision in 2014   RECTOCELE REPAIR     RIGHT/LEFT HEART CATH AND CORONARY ANGIOGRAPHY N/A 07/04/2023   Procedure: RIGHT/LEFT HEART CATH AND CORONARY ANGIOGRAPHY;  Surgeon: Anner Alm ORN, MD;  Location: [facility] INVASIVE CV LAB;  Service: Cardiovascular;  Laterality: N/A;   URETERAL STENT PLACEMENT     URETEROSCOPY      Social History   Tobacco Use   Smoking status: Never    Passive exposure: Never   Smokeless tobacco: Never  Vaping  Use   Vaping status: Never Used  Substance Use Topics   Alcohol use: Yes    Alcohol/week: 2.0 - 3.0 standard drinks of alcohol    Types: 2 - 3 Glasses of wine per week    Comment: couple of glasses per week   Drug use: No    Family History  Problem Relation Age of Onset   Hyperlipidemia Mother    Heart disease Mother    Hypertension Mother    Stroke Mother    Thyroid  disease  Mother    Hypertension Father    Heart disease Father     Allergies  Allergen Reactions   Latex Itching   Ciprofloxacin Hives   Lasix  [Furosemide ]     MAY have caused a rash- also consider lasix  allergy trigger.  Likely ok to try lasix  or a similar med if needed in the future    Metoprolol      MAY have caused a rash vs latex allergy during hospital admission. Likely reasonable to try a BB again if needed later    Metronidazole Hives   Tape Rash and Itching    Redness of skin    Medication list has been reviewed and updated.  Current Outpatient Medications on File Prior to Visit  Medication Sig Dispense Refill   Accu-Chek Softclix Lancets lancets Check blood sugars once daily 100 each 12   Acetaminophen  (TYLENOL  ARTHRITIS PAIN PO) Take 650 mg by mouth every 8 (eight) hours as needed (PAIN).     amoxicillin  (AMOXIL ) 500 MG tablet Take 4 tablets (2,000 mg total) by mouth 1 hour prior to dental appointment. 12 tablet 1   Ascorbic Acid  (VITAMIN C  WITH ROSE HIPS) 500 MG tablet Take 500 mg by mouth daily.     blood glucose meter kit and supplies KIT Dispense based on patient and insurance preference. Use once daily to monitor glucose as needed (FOR ICD-9 250.00, 250.01). 1 each 0   CALCIUM  PO Take 1 tablet by mouth daily.     Cholecalciferol (VITAMIN D3) 2000 units TABS Take 1 tablet by mouth in the morning and at bedtime.     clopidogrel  (PLAVIX ) 75 MG tablet Take 1 tablet (75 mg total) by mouth daily. 90 tablet 3   clotrimazole -betamethasone  (LOTRISONE ) cream Apply to plantar foot daily 45 g 3    CRANBERRY EXTRACT PO Take 25,000 mg by mouth 2 (two) times daily.     cycloSPORINE  (RESTASIS ) 0.05 % ophthalmic emulsion Place 1 drop into both eyes 2 (two) times daily. 180 each 3   desmopressin  (DDAVP ) 0.2 MG tablet Take 1 tablet (0.2 mg total) by mouth at bedtime. 90 tablet 4   desonide  (DESOWEN ) 0.05 % cream Apply as directed to affected area. 60 g 1   econazole nitrate 1 % cream Apply 1 Application topically as needed (skin irritation).     fluticasone  (FLONASE ) 50 MCG/ACT nasal spray Place 2 sprays into both nostrils daily. 48 g 3   glucose blood test strip Use as directed to check blood glucose daily. 100 each 12   losartan  (COZAAR ) 100 MG tablet Take 1 tablet (100 mg total) by mouth daily. 90 tablet 3   meloxicam  (MOBIC ) 15 MG tablet Take 1 tablet (15 mg total) by mouth daily with food. Do not combine with other NSAIDs 30 tablet 2   metFORMIN  (GLUCOPHAGE ) 1000 MG tablet Take 1 tablet (1,000 mg total) by mouth 2 (two) times daily with a meal. 180 tablet 3   methocarbamol  (ROBAXIN ) 500 MG tablet Take 1 tablet (500 mg total) by mouth 4 (four) times daily for 10 days. 40 tablet 0   mirabegron  ER (MYRBETRIQ ) 50 MG TB24 tablet Take 1 tablet (50 mg total) by mouth daily. 90 tablet 4   omega-3 acid ethyl esters (LOVAZA ) 1 g capsule Take 2 capsules (2 g total) by mouth 2 (two) times daily. 360 capsule 3   OXYCODONE  HCL PO Take 1 tablet by mouth as needed (pain).     pantoprazole  (PROTONIX ) 40 MG tablet Take 1 tablet (40  mg total) by mouth 2 (two) times daily. 180 tablet 3   Polyethyl Glycol-Propyl Glycol (SYSTANE OP) Apply 1 drop to eye daily as needed (for dry eyes).     PSYLLIUM PO Take 1 tablet by mouth daily.     simvastatin  (ZOCOR ) 20 MG tablet Take 1 tablet (20 mg total) by mouth at bedtime. 90 tablet 3   solifenacin  (VESICARE ) 10 MG tablet Take 1 tablet (10 mg total) by mouth daily. Swallow tablet whole; do not crush, chew, or split. 90 tablet 4   No current facility-administered  medications on file prior to visit.    Review of Systems:  As per HPI- otherwise negative.   Physical Examination: Vitals:   01/10/24 0817  BP: (!) 152/82  Pulse: 92  SpO2: 98%   Vitals:   01/10/24 0817  Weight: 246 lb 3.2 oz (111.7 kg)  Height: 5' 1 (1.549 m)   Body mass index is 46.52 kg/m. Ideal Body Weight: Weight in (lb) to have BMI = 25: 132  GEN: no acute distress.  Central obesity, looks well HEENT: Atraumatic, Normocephalic.  Ears and Nose: No external deformity. CV: RRR, No M/G/R. No JVD. No thrill. No extra heart sounds. PULM: CTA B, no wheezes, crackles, rhonchi. No retractions. No resp. distress. No accessory muscle use. ABD: S, NT, ND, +BS. No rebound. No HSM. EXTR: No c/c/e PSYCH: Normally interactive. Conversant.  She indicates the musculature in her right lower back/flank area as the location of her discomfort.  Lumbar flexion is somewhat limited due to pain, extension is normal.  Normal strength of both legs.  There is no skin rash, no redness or swelling.  Belly is benign  Assessment and Plan: Frequent UTI - Plan: estradiol  (ESTRACE ) 0.01 % CREA vaginal cream  Acute right-sided low back pain with right-sided sciatica - Plan: predniSONE  (DELTASONE ) 20 MG tablet  Controlled type 2 diabetes mellitus without complication, without long-term current use of insulin  (HCC)  Essential hypertension - Plan: amLODipine  (NORVASC ) 2.5 MG tablet  Assessment & Plan Essential hypertension Blood pressure remains elevated despite losartan  100 mg daily. - Added amlodipine  2.5 mg daily to current losartan  regimen. - Monitor blood pressure regularly.  Type 2 diabetes mellitus without complication Blood glucose levels well-controlled. Prednisone  may affect levels.  We use a conservative dose, 20 mg daily for 3 to 5 days - Monitor blood glucose levels closely while on prednisone .  Lumbosacral radiculopathy with right-sided sciatica Moderate to severe spinal changes  with right-sided sciatica and foot numbness.  Possibly due to pinched nerve or bulging disc.  Also suspect muscle spasm - Prescribed prednisone  20 mg daily for 3-5 days to reduce inflammation and pain. - Monitor for improvement in symptoms. - Consider lumbar MRI if symptoms persist.  Urinary tract infection Estrogen cream used to reduce UTI risk. - Refilled estrogen cream prescription.  Signed Harlene Schroeder, MD "

## 2024-01-10 ENCOUNTER — Ambulatory Visit: Admitting: Family Medicine

## 2024-01-10 ENCOUNTER — Encounter: Payer: Self-pay | Admitting: Family Medicine

## 2024-01-10 ENCOUNTER — Other Ambulatory Visit (HOSPITAL_BASED_OUTPATIENT_CLINIC_OR_DEPARTMENT_OTHER): Payer: Self-pay

## 2024-01-10 VITALS — BP 152/82 | HR 92 | Ht 61.0 in | Wt 246.2 lb

## 2024-01-10 DIAGNOSIS — E119 Type 2 diabetes mellitus without complications: Secondary | ICD-10-CM

## 2024-01-10 DIAGNOSIS — N390 Urinary tract infection, site not specified: Secondary | ICD-10-CM

## 2024-01-10 DIAGNOSIS — I10 Essential (primary) hypertension: Secondary | ICD-10-CM | POA: Diagnosis not present

## 2024-01-10 DIAGNOSIS — M5441 Lumbago with sciatica, right side: Secondary | ICD-10-CM

## 2024-01-10 DIAGNOSIS — Z1211 Encounter for screening for malignant neoplasm of colon: Secondary | ICD-10-CM

## 2024-01-10 MED ORDER — PREDNISONE 20 MG PO TABS
20.0000 mg | ORAL_TABLET | Freq: Every day | ORAL | 0 refills | Status: AC
  Filled 2024-01-10: qty 5, 5d supply, fill #0

## 2024-01-10 MED ORDER — AMLODIPINE BESYLATE 2.5 MG PO TABS
2.5000 mg | ORAL_TABLET | Freq: Every day | ORAL | 3 refills | Status: AC
  Filled 2024-01-10: qty 90, 90d supply, fill #0

## 2024-01-10 MED ORDER — ESTRADIOL 0.01 % VA CREA
TOPICAL_CREAM | VAGINAL | 12 refills | Status: AC
  Filled 2024-01-10: qty 42.5, 30d supply, fill #0
  Filled 2024-02-04: qty 42.5, 30d supply, fill #1
  Filled 2024-03-05: qty 42.5, 30d supply, fill #2

## 2024-01-10 NOTE — Patient Instructions (Signed)
 It was good to see you today Add amlodipine  2.5 mg to your losartan  for BP Prednisone  20 mg daily for 3-5 days for your back; watch your blood sugar.  If it goes too high stop using the prednisone  Also recommend NOT taking meloxicam  while you are on the pred  If your back is not feeling better in a few days you may need to discuss an MRI with your ortho provider- let me know if you need help I will also get in touch with [facility] and see what they have to say

## 2024-01-10 NOTE — Telephone Encounter (Signed)
 Pt had OV with PCP 01/10/2024.

## 2024-01-16 ENCOUNTER — Other Ambulatory Visit (HOSPITAL_BASED_OUTPATIENT_CLINIC_OR_DEPARTMENT_OTHER): Payer: Self-pay

## 2024-01-16 MED ORDER — AMOXICILLIN 500 MG PO CAPS
2000.0000 mg | ORAL_CAPSULE | ORAL | 5 refills | Status: DC
  Filled 2024-01-16: qty 12, 3d supply, fill #0
  Filled 2024-01-29: qty 12, 3d supply, fill #1
  Filled 2024-02-05: qty 12, 3d supply, fill #2

## 2024-01-19 ENCOUNTER — Other Ambulatory Visit: Payer: Self-pay

## 2024-01-19 ENCOUNTER — Other Ambulatory Visit (HOSPITAL_BASED_OUTPATIENT_CLINIC_OR_DEPARTMENT_OTHER): Payer: Self-pay

## 2024-01-28 ENCOUNTER — Other Ambulatory Visit: Payer: Self-pay | Admitting: Family Medicine

## 2024-01-29 ENCOUNTER — Other Ambulatory Visit: Payer: Self-pay

## 2024-01-29 ENCOUNTER — Other Ambulatory Visit (HOSPITAL_BASED_OUTPATIENT_CLINIC_OR_DEPARTMENT_OTHER): Payer: Self-pay

## 2024-01-29 MED ORDER — ACCU-CHEK GUIDE TEST VI STRP
1.0000 | ORAL_STRIP | Freq: Every day | 12 refills | Status: AC
  Filled 2024-01-29: qty 100, 100d supply, fill #0

## 2024-02-09 ENCOUNTER — Other Ambulatory Visit (HOSPITAL_BASED_OUTPATIENT_CLINIC_OR_DEPARTMENT_OTHER): Payer: Self-pay

## 2024-02-09 ENCOUNTER — Ambulatory Visit (HOSPITAL_BASED_OUTPATIENT_CLINIC_OR_DEPARTMENT_OTHER): Admitting: Pharmacist Clinician (PhC)/ Clinical Pharmacy Specialist

## 2024-02-09 ENCOUNTER — Encounter (HOSPITAL_BASED_OUTPATIENT_CLINIC_OR_DEPARTMENT_OTHER): Payer: Self-pay | Admitting: Pharmacist Clinician (PhC)/ Clinical Pharmacy Specialist

## 2024-02-09 VITALS — BP 132/56 | HR 81 | Ht 61.0 in | Wt 239.4 lb

## 2024-02-09 DIAGNOSIS — E669 Obesity, unspecified: Secondary | ICD-10-CM

## 2024-02-09 DIAGNOSIS — I10 Essential (primary) hypertension: Secondary | ICD-10-CM | POA: Diagnosis not present

## 2024-02-09 MED ORDER — OLMESARTAN MEDOXOMIL 40 MG PO TABS
40.0000 mg | ORAL_TABLET | Freq: Every day | ORAL | 3 refills | Status: AC
  Filled 2024-02-09: qty 90, 90d supply, fill #0

## 2024-02-09 NOTE — Assessment & Plan Note (Signed)
 Discussed use of GLP medications, she had tried Ozempic  once in the past, d/c 2/2 GI issues.  She will discuss with PCP and offered that we can work with her on medication titration if she is interested.

## 2024-02-09 NOTE — Patient Instructions (Signed)
 Follow up appointment: Reach out in February via MyChart to let me know how your BP readings are doing.  We will schedule something for March at that time.   Take your BP meds as follows: finish your losartan  100 mg daily then switch to olmesartan 40 mg once daily.  Continue taking amlodipine  2.5 mg once daily.    Check your blood pressure at home twice daily at least 3 days per week and keep record of the readings.  Your blood pressure goal is < 130/80  To check your pressure at home you will need to:  1. Sit up in a chair, with feet flat on the floor and back supported. Do not cross your ankles or legs. 2. Rest your left arm so that the cuff is about heart level. If the cuff goes on your upper arm,  then just relax the arm on the table, arm of the chair or your lap. If you have a wrist cuff, we  suggest relaxing your wrist against your chest (think of it as Pledging the Flag with the  wrong arm).  3. Place the cuff snugly around your arm, about 1 inch above the crook of your elbow. The  cords should be inside the groove of your elbow.  4. Sit quietly, with the cuff in place, for about 5 minutes. After that 5 minutes press the power  button to start a reading. 5. Do not talk or move while the reading is taking place.  6. Record your readings on a sheet of paper. Although most cuffs have a memory, it is often  easier to see a pattern developing when the numbers are all in front of you.  7. You can repeat the reading after 1-3 minutes if it is recommended  Make sure your bladder is empty and you have not had caffeine or tobacco within the last 30 min  Always bring your blood pressure log with you to your appointments. If you have not brought your monitor in to be double checked for accuracy, please bring it to your next appointment.  You can find a list of quality blood pressure cuffs at [email or web address]  Important lifestyle changes to control high blood pressure  Intervention  Effect on  the BP  Lose extra pounds and watch your waistline Weight loss is one of the most effective lifestyle changes for controlling blood pressure. If you're overweight or obese, losing even a small amount of weight can help reduce blood pressure. Blood pressure might go down by about 1 millimeter of mercury (mm Hg) with each kilogram (about 2.2 pounds) of weight lost.  Exercise regularly As a general goal, aim for at least 30 minutes of moderate physical activity every day. Regular physical activity can lower high blood pressure by about 5 to 8 mm Hg.  Eat a healthy diet Eating a diet rich in whole grains, fruits, vegetables, and low-fat dairy products and low in saturated fat and cholesterol. A healthy diet can lower high blood pressure by up to 11 mm Hg.  Reduce salt (sodium) in your diet Even a small reduction of sodium in the diet can improve heart health and reduce high blood pressure by about 5 to 6 mm Hg.  Limit alcohol One drink equals 12 ounces of beer, 5 ounces of wine, or 1.5 ounces of 80-proof liquor.  Limiting alcohol to less than one drink a day for women or two drinks a day for men can help lower blood pressure by about 4  mm Hg.   If you have any questions or concerns please use My Chart to send questions or call the office at [phone]

## 2024-02-09 NOTE — Assessment & Plan Note (Signed)
 Assessment: BP is uncontrolled in office BP 132/56 mmHg;  slightly above the goal (<130/80). Tolerates losartan  and amlodipine  well, without any side effects Denies SOB, palpitation, chest pain, headaches,or swelling Reiterated the importance of regular exercise and low salt diet   Plan:  Finish off current prescription of losartan  100 mg daily.  Then start olmesartan  40 mg daily Continue taking amlodipine  2.5 mg daily Patient to keep record of BP readings with heart rate and report to us  at the next visit Patient to follow up with me in March  Labs ordered today:  none

## 2024-02-09 NOTE — Progress Notes (Signed)
 "  Office Visit    Patient Name: Jasmin [name] Date of Encounter: 02/09/2024  Primary Care Provider:  Watt Harlene BROCKS, MD Primary Cardiologist:  Annabella Scarce, MD  Chief Complaint    Hypertension  Significant Past Medical History   CAD 12/23 CAC = 1245 (95th percentile), 5/25 cath with normal coronary arteries  DM2 9/25 A1c 6.3 on metformin  bid  HLD 5/25 LDL 64 on simvastatin   Aortic valve stenosis Moderate, repeat echo in May  PPM Placed 5/25     Allergies[1]  History of Present Illness    Jasmin [name] is a 78 y.o. female patient of Dr Scarce, in the office today for hypertension evaluation.   She has been followed by [facility] for years, but at her most recent appointment with Dr. Scarce was found to have a BP of 156/66.  Losartan  was increased from 75 to 100 mg daily and she was asked to bring home readings and device for follow up.    Today she is in the office with her husband, for follow up.  Has been using her husbands BP device (Omron) because hers did not come with a large cuff.  Her PCP recently added amlodipine  2.5 mg daily for added BP control.  No concerns about her medications today.  She was a little frustrated, as her most recent A1c drawn by PCP was back up to 6.7.  Had been in the low 6's for some time.    Blood Pressure Goal:  130/80  Current Medications:  amlodipine  2.5 mg daily (pm), losartan  100 mg daily (am)  Previously tried:  metoprolol , furosemide  - rash (may have been latex allergy, during hospital admission - reasonable to try again if needed)  Social Hx:      Tobacco: no  Alcohol: maybe once weekly, glass of wine  Caffeine: decaf most days, rare sodas, decaf green tea  Diet:  3 meals usually, although sometimes skips lunch, breakfast is cereal or egg, turkey bacon; salad most days with dinner, vegetables and protein, usually chicken, occasional red meat; fish once weekly - salmon or shrimp  Exercise: [employer] 5 times per week -  cardio and knee strengthening (previous knee replacements); gardening, housework  Home BP readings: has home Omron - with large cuff.  18 AM readings average 136/66 HR 67  (range 126-149/59-71)  17 PM readings average 131/63 HR 75  (range 121-144/57-67)   Accessory Clinical Findings    Lab Results  Component Value Date   CREATININE 0.57 11/15/2023   BUN 15 11/15/2023   NA 136 11/15/2023   K 4.6 11/15/2023   CL 98 11/15/2023   CO2 29 11/15/2023   Lab Results  Component Value Date   ALT 13 07/07/2023   AST 25 07/07/2023   ALKPHOS 42 07/07/2023   BILITOT 1.3 (H) 07/07/2023   Lab Results  Component Value Date   HGBA1C 7.6 (H) 11/15/2023    Home Medications    Current Outpatient Medications  Medication Sig Dispense Refill   Accu-Chek Softclix Lancets lancets Check blood sugars once daily 100 each 12   Acetaminophen  (TYLENOL  ARTHRITIS PAIN PO) Take 650 mg by mouth every 8 (eight) hours as needed (PAIN).     amLODipine  (NORVASC ) 2.5 MG tablet Take 1 tablet (2.5 mg total) by mouth daily. 90 tablet 3   amoxicillin  (AMOXIL ) 500 MG tablet Take 4 tablets (2,000 mg total) by mouth 1 hour prior to dental appointment. 12 tablet 1   Ascorbic Acid  (VITAMIN C  WITH ROSE HIPS)  500 MG tablet Take 500 mg by mouth daily.     blood glucose meter kit and supplies KIT Dispense based on patient and insurance preference. Use once daily to monitor glucose as needed (FOR ICD-9 250.00, 250.01). 1 each 0   CALCIUM  PO Take 1 tablet by mouth daily.     Cholecalciferol (VITAMIN D3) 2000 units TABS Take 1 tablet by mouth in the morning and at bedtime.     clopidogrel  (PLAVIX ) 75 MG tablet Take 1 tablet (75 mg total) by mouth daily. 90 tablet 3   clotrimazole -betamethasone  (LOTRISONE ) cream Apply to plantar foot daily 45 g 3   CRANBERRY EXTRACT PO Take 25,000 mg by mouth 2 (two) times daily.     cycloSPORINE  (RESTASIS ) 0.05 % ophthalmic emulsion Place 1 drop into both eyes 2 (two) times daily. 180 each 3    desmopressin  (DDAVP ) 0.2 MG tablet Take 1 tablet (0.2 mg total) by mouth at bedtime. 90 tablet 4   desonide  (DESOWEN ) 0.05 % cream Apply as directed to affected area. 60 g 1   econazole nitrate 1 % cream Apply 1 Application topically as needed (skin irritation).     estradiol  (ESTRACE ) 0.01 % CREA vaginal cream Apply 0.5 gm vaginally twice weekly. 42.5 g 12   fluticasone  (FLONASE ) 50 MCG/ACT nasal spray Place 2 sprays into both nostrils daily. 48 g 3   glucose blood (ACCU-CHEK GUIDE TEST) test strip Use as directed to check blood glucose daily. 100 each 12   metFORMIN  (GLUCOPHAGE ) 1000 MG tablet Take 1 tablet (1,000 mg total) by mouth 2 (two) times daily with a meal. 180 tablet 3   mirabegron  ER (MYRBETRIQ ) 50 MG TB24 tablet Take 1 tablet (50 mg total) by mouth daily. 90 tablet 4   olmesartan  (BENICAR ) 40 MG tablet Take 1 tablet (40 mg total) by mouth daily. 90 tablet 3   omega-3 acid ethyl esters (LOVAZA ) 1 g capsule Take 2 capsules (2 g total) by mouth 2 (two) times daily. 360 capsule 3   OXYCODONE  HCL PO Take 1 tablet by mouth as needed (pain).     pantoprazole  (PROTONIX ) 40 MG tablet Take 1 tablet (40 mg total) by mouth 2 (two) times daily. 180 tablet 3   Polyethyl Glycol-Propyl Glycol (SYSTANE OP) Apply 1 drop to eye daily as needed (for dry eyes).     PSYLLIUM PO Take 1 tablet by mouth daily.     simvastatin  (ZOCOR ) 20 MG tablet Take 1 tablet (20 mg total) by mouth at bedtime. 90 tablet 3   solifenacin  (VESICARE ) 10 MG tablet Take 1 tablet (10 mg total) by mouth daily. Swallow tablet whole; do not crush, chew, or split. 90 tablet 4   meloxicam  (MOBIC ) 15 MG tablet Take 1 tablet (15 mg total) by mouth daily with food. Do not combine with other NSAIDs (Patient not taking: Reported on 02/09/2024) 30 tablet 2   No current facility-administered medications for this visit.      Assessment & Plan    Essential hypertension Assessment: BP is uncontrolled in office BP 132/56 mmHg;  slightly  above the goal (<130/80). Tolerates losartan  and amlodipine  well, without any side effects Denies SOB, palpitation, chest pain, headaches,or swelling Reiterated the importance of regular exercise and low salt diet   Plan:  Finish off current prescription of losartan  100 mg daily.  Then start olmesartan  40 mg daily Continue taking amlodipine  2.5 mg daily Patient to keep record of BP readings with heart rate and report to us  at the  next visit Patient to follow up with me in March  Labs ordered today:  none   Obesity, Beginning BMI 46.86 Discussed use of GLP medications, she had tried Ozempic  once in the past, d/c 2/2 GI issues.  She will discuss with PCP and offered that we can work with her on medication titration if she is interested.     Allean Mink PharmD CPP [facility]  [address] [place], [place] [address] [phone]       [1]  Allergies Allergen Reactions   Latex Itching   Ciprofloxacin Hives   Lasix  [Furosemide ]     MAY have caused a rash- also consider lasix  allergy trigger.  Likely ok to try lasix  or a similar med if needed in the future    Metoprolol      MAY have caused a rash vs latex allergy during hospital admission. Likely reasonable to try a BB again if needed later    Metronidazole Hives   Tape Rash and Itching    Redness of skin   "

## 2024-02-16 ENCOUNTER — Other Ambulatory Visit (HOSPITAL_BASED_OUTPATIENT_CLINIC_OR_DEPARTMENT_OTHER): Payer: Self-pay

## 2024-02-19 ENCOUNTER — Other Ambulatory Visit: Payer: Self-pay

## 2024-02-19 ENCOUNTER — Ambulatory Visit (INDEPENDENT_AMBULATORY_CARE_PROVIDER_SITE_OTHER)

## 2024-02-19 ENCOUNTER — Other Ambulatory Visit (HOSPITAL_BASED_OUTPATIENT_CLINIC_OR_DEPARTMENT_OTHER): Payer: Self-pay

## 2024-02-19 DIAGNOSIS — I442 Atrioventricular block, complete: Secondary | ICD-10-CM | POA: Diagnosis not present

## 2024-02-20 LAB — CUP PACEART REMOTE DEVICE CHECK
Battery Remaining Longevity: 125 mo
Battery Remaining Percentage: 95.5 %
Battery Voltage: 3.01 V
Brady Statistic AP VP Percent: 1 %
Brady Statistic AP VS Percent: 2.5 %
Brady Statistic AS VP Percent: 1 %
Brady Statistic AS VS Percent: 97 %
Brady Statistic RA Percent Paced: 2.5 %
Brady Statistic RV Percent Paced: 1 %
Date Time Interrogation Session: 20251229022059
Implantable Lead Connection Status: 753985
Implantable Lead Connection Status: 753985
Implantable Lead Implant Date: 20250516
Implantable Lead Implant Date: 20250516
Implantable Lead Location: 753859
Implantable Lead Location: 753860
Implantable Pulse Generator Implant Date: 20250516
Lead Channel Impedance Value: 390 Ohm
Lead Channel Impedance Value: 480 Ohm
Lead Channel Pacing Threshold Amplitude: 0.625 V
Lead Channel Pacing Threshold Amplitude: 1 V
Lead Channel Pacing Threshold Pulse Width: 0.5 ms
Lead Channel Pacing Threshold Pulse Width: 0.5 ms
Lead Channel Sensing Intrinsic Amplitude: 3.2 mV
Lead Channel Sensing Intrinsic Amplitude: 8.8 mV
Lead Channel Setting Pacing Amplitude: 1.25 V
Lead Channel Setting Pacing Amplitude: 1.625
Lead Channel Setting Pacing Pulse Width: 0.5 ms
Lead Channel Setting Sensing Sensitivity: 2 mV
Pulse Gen Model: 2272
Pulse Gen Serial Number: 8276359

## 2024-02-24 ENCOUNTER — Ambulatory Visit: Payer: Self-pay | Admitting: Cardiology

## 2024-02-28 NOTE — Progress Notes (Signed)
 Remote PPM Transmission

## 2024-03-15 ENCOUNTER — Telehealth: Payer: Self-pay

## 2024-03-15 ENCOUNTER — Telehealth (HOSPITAL_BASED_OUTPATIENT_CLINIC_OR_DEPARTMENT_OTHER): Payer: Self-pay | Admitting: *Deleted

## 2024-03-15 NOTE — Telephone Encounter (Signed)
" ° °  Name: Jasmin [name]  DOB: 11/07/1945  MRN: [id number]  Primary Cardiologist: Annabella Scarce, MD   Preoperative team, please contact this patient and set up a phone call appointment for further preoperative risk assessment. Please obtain consent and complete medication review. Thank you for your help.  I confirm that guidance regarding antiplatelet and oral anticoagulation therapy has been completed and, if necessary, noted below.  Patient is on plavix  for history of CVA. Recommendations should come from prescribing provider   I also confirmed the patient resides in the state of [place] . As per [facility] Medical Board telemedicine laws, the patient must reside in the state in which the provider is licensed.   Rollo FABIENE Louder, PA-C 03/15/2024, 9:33 AM [facility]    "

## 2024-03-15 NOTE — Telephone Encounter (Signed)
 Preop tele appt now scheduled, med rec and consent done.

## 2024-03-15 NOTE — Telephone Encounter (Signed)
"  °  Patient Consent for Virtual Visit        Jasmin [name] has provided verbal consent on 03/15/2024 for a virtual visit (video or telephone).   CONSENT FOR VIRTUAL VISIT FOR:  Jasmin [name]  By participating in this virtual visit I agree to the following:  I hereby voluntarily request, consent and authorize [facility] and its employed or contracted physicians, physician assistants, nurse practitioners or other licensed health care professionals (the Practitioner), to provide me with telemedicine health care services (the Services) as deemed necessary by the treating Practitioner. I acknowledge and consent to receive the Services by the Practitioner via telemedicine. I understand that the telemedicine visit will involve communicating with the Practitioner through live audiovisual communication technology and the disclosure of certain medical information by electronic transmission. I acknowledge that I have been given the opportunity to request an in-person assessment or other available alternative prior to the telemedicine visit and am voluntarily participating in the telemedicine visit.  I understand that I have the right to withhold or withdraw my consent to the use of telemedicine in the course of my care at any time, without affecting my right to future care or treatment, and that the Practitioner or I may terminate the telemedicine visit at any time. I understand that I have the right to inspect all information obtained and/or recorded in the course of the telemedicine visit and may receive copies of available information for a reasonable fee.  I understand that some of the potential risks of receiving the Services via telemedicine include:  Delay or interruption in medical evaluation due to technological equipment failure or disruption; Information transmitted may not be sufficient (e.g. poor resolution of images) to allow for appropriate medical decision making by the  Practitioner; and/or  In rare instances, security protocols could fail, causing a breach of personal health information.  Furthermore, I acknowledge that it is my responsibility to provide information about my medical history, conditions and care that is complete and accurate to the best of my ability. I acknowledge that Practitioner's advice, recommendations, and/or decision may be based on factors not within their control, such as incomplete or inaccurate data provided by me or distortions of diagnostic images or specimens that may result from electronic transmissions. I understand that the practice of medicine is not an exact science and that Practitioner makes no warranties or guarantees regarding treatment outcomes. I acknowledge that a copy of this consent can be made available to me via my patient portal [facility] MyChart), or I can request a printed copy by calling the office of [facility].    I understand that my insurance will be billed for this visit.   I have read or had this consent read to me. I understand the contents of this consent, which adequately explains the benefits and risks of the Services being provided via telemedicine.  I have been provided ample opportunity to ask questions regarding this consent and the Services and have had my questions answered to my satisfaction. I give my informed consent for the services to be provided through the use of telemedicine in my medical care    "

## 2024-03-15 NOTE — Telephone Encounter (Signed)
"  ° °  Pre-operative Risk Assessment    Patient Name: Jasmin [name]  DOB: Apr 21, 1945 MRN: [id number]   Date of last office visit: 12/11/23 DR. [place] Date of next office visit: NONE   Request for Surgical Clearance    Procedure:  COLONOSCOPY  Date of Surgery:  Clearance TBD                                Surgeon: NOT LISTED Surgeon's Group or Practice Name:  [facility] Phone number:  [phone] Fax number:  [phone]   Type of Clearance Requested:   - Medical  - Pharmacy:  Hold Clopidogrel  (Plavix ) x 5 DAYS PRIOR   Type of Anesthesia:  Not Indicated (PROPOFOL?)    Additional requests/questions:    Bonney Niels Jest   03/15/2024, 9:10 AM   "

## 2024-03-21 NOTE — Telephone Encounter (Signed)
 Requesting office inquiring on clearance. Pt has tele preop appt 03/26/24. Once the pt has been cleared notes will be faxed to requesting office.

## 2024-03-26 ENCOUNTER — Ambulatory Visit

## 2024-03-26 ENCOUNTER — Encounter: Payer: Self-pay | Admitting: Cardiology

## 2024-03-26 ENCOUNTER — Telehealth: Payer: Self-pay | Admitting: Pediatrics

## 2024-03-26 DIAGNOSIS — Z01810 Encounter for preprocedural cardiovascular examination: Secondary | ICD-10-CM

## 2024-03-26 NOTE — Progress Notes (Signed)
 PERIOPERATIVE PRESCRIPTION FOR IMPLANTED CARDIAC DEVICE PROGRAMMING   Patient Information:  Patient: Jasmin [name]  MRN: [id number]  Date of Birth: 08-23-1945   Procedure:  COLONOSCOPY Date of Surgery:  Clearance TBD                              Surgeon: NOT LISTED Surgeon's Group or Practice Name:  [facility] Phone number:  [phone] Fax number:  [phone]   Device Information:   Clinic EP Physician:   Dr. Soyla Norton Device Type:  Pacemaker Manufacturer and Phone #:  [employer]: [phone] Pacemaker Dependent?:  No Date of Last Device Check:  02/19/2024         Normal Device Function?:  Yes     Electrophysiologist's Recommendations:   Have magnet available. Provide continuous ECG monitoring when magnet is used or reprogramming is to be performed.  Procedure may interfere with device function.  Magnet should be placed over device during procedure.  Per Device Clinic Standing Orders, Almarie ONEIDA Shutter  03/26/2024 1:27 PM

## 2024-05-15 ENCOUNTER — Ambulatory Visit: Admitting: Family Medicine

## 2024-05-20 ENCOUNTER — Encounter

## 2024-06-21 ENCOUNTER — Other Ambulatory Visit (HOSPITAL_BASED_OUTPATIENT_CLINIC_OR_DEPARTMENT_OTHER)

## 2024-08-19 ENCOUNTER — Encounter

## 2024-11-18 ENCOUNTER — Encounter

## 2025-01-08 ENCOUNTER — Ambulatory Visit

## 2025-02-17 ENCOUNTER — Encounter

## 2025-05-19 ENCOUNTER — Encounter
